# Patient Record
Sex: Female | Born: 1935
Health system: Southern US, Community
[De-identification: ages and names within clinical notes are randomized; demographics above are authoritative.]

## PROBLEM LIST (undated history)

## (undated) ENCOUNTER — Ambulatory Visit: Admission: EM | Payer: Medicare PPO | Source: Home / Self Care

## (undated) DIAGNOSIS — I209 Angina pectoris, unspecified: Secondary | ICD-10-CM

## (undated) DIAGNOSIS — R112 Nausea with vomiting, unspecified: Secondary | ICD-10-CM

## (undated) DIAGNOSIS — M1990 Unspecified osteoarthritis, unspecified site: Secondary | ICD-10-CM

## (undated) DIAGNOSIS — K589 Irritable bowel syndrome without diarrhea: Secondary | ICD-10-CM

## (undated) DIAGNOSIS — E039 Hypothyroidism, unspecified: Secondary | ICD-10-CM

## (undated) DIAGNOSIS — Z9889 Other specified postprocedural states: Secondary | ICD-10-CM

## (undated) DIAGNOSIS — D649 Anemia, unspecified: Secondary | ICD-10-CM

## (undated) DIAGNOSIS — H269 Unspecified cataract: Secondary | ICD-10-CM

## (undated) DIAGNOSIS — D759 Disease of blood and blood-forming organs, unspecified: Secondary | ICD-10-CM

## (undated) DIAGNOSIS — I639 Cerebral infarction, unspecified: Secondary | ICD-10-CM

## (undated) DIAGNOSIS — I2510 Atherosclerotic heart disease of native coronary artery without angina pectoris: Secondary | ICD-10-CM

## (undated) DIAGNOSIS — T8859XA Other complications of anesthesia, initial encounter: Secondary | ICD-10-CM

## (undated) DIAGNOSIS — E7800 Pure hypercholesterolemia, unspecified: Secondary | ICD-10-CM

## (undated) DIAGNOSIS — I10 Essential (primary) hypertension: Secondary | ICD-10-CM

## (undated) DIAGNOSIS — T4145XA Adverse effect of unspecified anesthetic, initial encounter: Secondary | ICD-10-CM

## (undated) HISTORY — DX: Irritable bowel syndrome, unspecified: K58.9

## (undated) HISTORY — DX: Unspecified osteoarthritis, unspecified site: M19.90

## (undated) HISTORY — PX: OTHER SURGICAL HISTORY: SHX169

## (undated) HISTORY — PX: TOTAL KNEE ARTHROPLASTY: SHX125

## (undated) HISTORY — PX: APPENDECTOMY: SHX54

## (undated) HISTORY — DX: Cerebral infarction, unspecified: I63.9

## (undated) HISTORY — DX: Essential (primary) hypertension: I10

## (undated) HISTORY — PX: TONSILLECTOMY: SUR1361

## (undated) HISTORY — DX: Hypothyroidism, unspecified: E03.9

## (undated) HISTORY — DX: Pure hypercholesterolemia, unspecified: E78.00

## (undated) HISTORY — PX: VAGINAL HYSTERECTOMY: SUR661

## (undated) HISTORY — PX: SPINE SURGERY: SHX786

## (undated) HISTORY — DX: Atherosclerotic heart disease of native coronary artery without angina pectoris: I25.10

---

## 1998-05-24 ENCOUNTER — Ambulatory Visit (HOSPITAL_COMMUNITY): Admission: RE | Admit: 1998-05-24 | Discharge: 1998-05-24 | Payer: Self-pay | Admitting: Family Medicine

## 1998-06-11 ENCOUNTER — Other Ambulatory Visit: Admission: RE | Admit: 1998-06-11 | Discharge: 1998-06-11 | Payer: Self-pay | Admitting: Family Medicine

## 1999-06-16 ENCOUNTER — Other Ambulatory Visit: Admission: RE | Admit: 1999-06-16 | Discharge: 1999-06-16 | Payer: Self-pay | Admitting: Family Medicine

## 2000-05-07 ENCOUNTER — Encounter: Admission: RE | Admit: 2000-05-07 | Discharge: 2000-05-07 | Payer: Self-pay | Admitting: Family Medicine

## 2000-05-07 ENCOUNTER — Encounter: Payer: Self-pay | Admitting: Family Medicine

## 2000-06-16 ENCOUNTER — Other Ambulatory Visit: Admission: RE | Admit: 2000-06-16 | Discharge: 2000-06-16 | Payer: Self-pay | Admitting: Family Medicine

## 2001-04-04 ENCOUNTER — Ambulatory Visit (HOSPITAL_COMMUNITY): Admission: RE | Admit: 2001-04-04 | Discharge: 2001-04-04 | Payer: Self-pay | Admitting: Gastroenterology

## 2001-06-08 ENCOUNTER — Other Ambulatory Visit: Admission: RE | Admit: 2001-06-08 | Discharge: 2001-06-08 | Payer: Self-pay | Admitting: Family Medicine

## 2001-06-13 ENCOUNTER — Encounter: Admission: RE | Admit: 2001-06-13 | Discharge: 2001-06-13 | Payer: Self-pay | Admitting: Family Medicine

## 2001-06-13 ENCOUNTER — Encounter: Payer: Self-pay | Admitting: Family Medicine

## 2002-06-09 ENCOUNTER — Other Ambulatory Visit: Admission: RE | Admit: 2002-06-09 | Discharge: 2002-06-09 | Payer: Self-pay | Admitting: Family Medicine

## 2002-06-20 ENCOUNTER — Encounter: Admission: RE | Admit: 2002-06-20 | Discharge: 2002-06-20 | Payer: Self-pay | Admitting: Family Medicine

## 2002-06-20 ENCOUNTER — Encounter: Payer: Self-pay | Admitting: Family Medicine

## 2003-06-13 ENCOUNTER — Other Ambulatory Visit: Admission: RE | Admit: 2003-06-13 | Discharge: 2003-06-13 | Payer: Self-pay | Admitting: Family Medicine

## 2003-07-03 ENCOUNTER — Encounter: Admission: RE | Admit: 2003-07-03 | Discharge: 2003-07-03 | Payer: Self-pay | Admitting: Family Medicine

## 2003-07-03 ENCOUNTER — Encounter: Payer: Self-pay | Admitting: Family Medicine

## 2004-03-18 ENCOUNTER — Emergency Department (HOSPITAL_COMMUNITY): Admission: EM | Admit: 2004-03-18 | Discharge: 2004-03-18 | Payer: Self-pay | Admitting: Emergency Medicine

## 2004-06-16 ENCOUNTER — Other Ambulatory Visit: Admission: RE | Admit: 2004-06-16 | Discharge: 2004-06-16 | Payer: Self-pay | Admitting: Family Medicine

## 2004-06-20 ENCOUNTER — Encounter: Admission: RE | Admit: 2004-06-20 | Discharge: 2004-06-20 | Payer: Self-pay | Admitting: Family Medicine

## 2005-06-17 ENCOUNTER — Other Ambulatory Visit: Admission: RE | Admit: 2005-06-17 | Discharge: 2005-06-17 | Payer: Self-pay | Admitting: Family Medicine

## 2005-06-30 ENCOUNTER — Encounter: Admission: RE | Admit: 2005-06-30 | Discharge: 2005-06-30 | Payer: Self-pay | Admitting: Family Medicine

## 2006-02-18 DIAGNOSIS — I2510 Atherosclerotic heart disease of native coronary artery without angina pectoris: Secondary | ICD-10-CM

## 2006-02-18 HISTORY — DX: Atherosclerotic heart disease of native coronary artery without angina pectoris: I25.10

## 2006-03-01 ENCOUNTER — Inpatient Hospital Stay (HOSPITAL_COMMUNITY): Admission: EM | Admit: 2006-03-01 | Discharge: 2006-03-04 | Payer: Self-pay | Admitting: Emergency Medicine

## 2006-03-01 ENCOUNTER — Ambulatory Visit: Payer: Self-pay | Admitting: Cardiovascular Disease

## 2006-03-17 ENCOUNTER — Ambulatory Visit: Payer: Self-pay | Admitting: *Deleted

## 2006-05-03 ENCOUNTER — Ambulatory Visit: Payer: Self-pay | Admitting: Cardiovascular Disease

## 2006-05-06 ENCOUNTER — Encounter (HOSPITAL_COMMUNITY): Admission: RE | Admit: 2006-05-06 | Discharge: 2006-08-04 | Payer: Self-pay | Admitting: Cardiovascular Disease

## 2006-07-07 ENCOUNTER — Encounter: Admission: RE | Admit: 2006-07-07 | Discharge: 2006-07-07 | Payer: Self-pay | Admitting: Family Medicine

## 2006-08-05 ENCOUNTER — Encounter (HOSPITAL_COMMUNITY): Admission: RE | Admit: 2006-08-05 | Discharge: 2006-09-17 | Payer: Self-pay | Admitting: Cardiovascular Disease

## 2006-10-19 ENCOUNTER — Ambulatory Visit: Payer: Self-pay | Admitting: Cardiovascular Disease

## 2007-07-11 ENCOUNTER — Encounter: Admission: RE | Admit: 2007-07-11 | Discharge: 2007-07-11 | Payer: Self-pay | Admitting: Family Medicine

## 2008-05-23 ENCOUNTER — Ambulatory Visit: Payer: Self-pay | Admitting: Cardiovascular Disease

## 2008-06-01 ENCOUNTER — Ambulatory Visit: Payer: Self-pay

## 2008-08-08 ENCOUNTER — Encounter: Admission: RE | Admit: 2008-08-08 | Discharge: 2008-08-08 | Payer: Self-pay | Admitting: Family Medicine

## 2008-12-30 ENCOUNTER — Ambulatory Visit: Payer: Self-pay | Admitting: Internal Medicine

## 2008-12-30 ENCOUNTER — Inpatient Hospital Stay: Payer: Medicare Other | Admitting: Internal Medicine

## 2009-01-08 ENCOUNTER — Ambulatory Visit: Payer: Self-pay | Admitting: Cardiovascular Disease

## 2009-01-08 DIAGNOSIS — I2510 Atherosclerotic heart disease of native coronary artery without angina pectoris: Secondary | ICD-10-CM | POA: Insufficient documentation

## 2009-01-08 DIAGNOSIS — E7800 Pure hypercholesterolemia, unspecified: Secondary | ICD-10-CM | POA: Insufficient documentation

## 2009-01-08 DIAGNOSIS — I10 Essential (primary) hypertension: Secondary | ICD-10-CM | POA: Insufficient documentation

## 2009-01-08 DIAGNOSIS — E039 Hypothyroidism, unspecified: Secondary | ICD-10-CM | POA: Insufficient documentation

## 2009-04-10 ENCOUNTER — Telehealth: Payer: Self-pay | Admitting: Cardiovascular Disease

## 2009-05-15 ENCOUNTER — Ambulatory Visit: Payer: Self-pay | Admitting: Cardiovascular Disease

## 2009-07-22 ENCOUNTER — Encounter: Payer: Self-pay | Admitting: Cardiovascular Disease

## 2009-08-09 ENCOUNTER — Encounter: Admission: RE | Admit: 2009-08-09 | Discharge: 2009-08-09 | Payer: Self-pay | Admitting: Family Medicine

## 2009-08-28 ENCOUNTER — Telehealth (INDEPENDENT_AMBULATORY_CARE_PROVIDER_SITE_OTHER): Payer: Self-pay | Admitting: *Deleted

## 2009-08-29 ENCOUNTER — Encounter: Payer: Self-pay | Admitting: Cardiovascular Disease

## 2009-09-05 ENCOUNTER — Encounter (INDEPENDENT_AMBULATORY_CARE_PROVIDER_SITE_OTHER): Payer: Self-pay | Admitting: *Deleted

## 2009-09-09 ENCOUNTER — Telehealth: Payer: Self-pay | Admitting: Gastroenterology

## 2009-11-11 ENCOUNTER — Telehealth: Payer: Self-pay | Admitting: Cardiovascular Disease

## 2009-11-13 ENCOUNTER — Encounter (INDEPENDENT_AMBULATORY_CARE_PROVIDER_SITE_OTHER): Payer: Self-pay | Admitting: *Deleted

## 2010-02-04 ENCOUNTER — Encounter: Payer: Self-pay | Admitting: Cardiovascular Disease

## 2010-03-19 ENCOUNTER — Ambulatory Visit: Payer: Self-pay | Admitting: Cardiovascular Disease

## 2010-03-24 ENCOUNTER — Telehealth (INDEPENDENT_AMBULATORY_CARE_PROVIDER_SITE_OTHER): Payer: Self-pay

## 2010-03-25 ENCOUNTER — Ambulatory Visit: Payer: Self-pay | Admitting: Cardiology

## 2010-03-25 ENCOUNTER — Ambulatory Visit: Payer: Self-pay

## 2010-03-25 ENCOUNTER — Encounter (HOSPITAL_COMMUNITY): Admission: RE | Admit: 2010-03-25 | Discharge: 2010-05-21 | Payer: Self-pay | Admitting: Cardiovascular Disease

## 2010-04-14 ENCOUNTER — Ambulatory Visit (HOSPITAL_COMMUNITY): Admission: RE | Admit: 2010-04-14 | Discharge: 2010-04-14 | Payer: Self-pay | Admitting: Orthopedic Surgery

## 2010-05-21 ENCOUNTER — Inpatient Hospital Stay (HOSPITAL_COMMUNITY): Admission: RE | Admit: 2010-05-21 | Discharge: 2010-05-25 | Payer: Self-pay | Admitting: Orthopedic Surgery

## 2010-05-23 ENCOUNTER — Encounter: Payer: Self-pay | Admitting: Cardiovascular Disease

## 2010-05-29 ENCOUNTER — Telehealth: Payer: Self-pay | Admitting: Gastroenterology

## 2010-07-10 ENCOUNTER — Encounter: Payer: Self-pay | Admitting: Cardiovascular Disease

## 2010-08-19 ENCOUNTER — Encounter: Admission: RE | Admit: 2010-08-19 | Discharge: 2010-08-19 | Payer: Self-pay | Admitting: Family Medicine

## 2010-10-09 ENCOUNTER — Ambulatory Visit: Payer: Self-pay | Admitting: Cardiovascular Disease

## 2010-12-29 ENCOUNTER — Inpatient Hospital Stay (HOSPITAL_COMMUNITY)
Admission: RE | Admit: 2010-12-29 | Discharge: 2011-01-01 | Payer: Self-pay | Source: Home / Self Care | Attending: Orthopedic Surgery | Admitting: Orthopedic Surgery

## 2011-01-05 LAB — PROTIME-INR
INR: 1.1 (ref 0.00–1.49)
INR: 1.52 — ABNORMAL HIGH (ref 0.00–1.49)
INR: 1.63 — ABNORMAL HIGH (ref 0.00–1.49)
Prothrombin Time: 14.4 seconds (ref 11.6–15.2)
Prothrombin Time: 18.5 seconds — ABNORMAL HIGH (ref 11.6–15.2)
Prothrombin Time: 19.5 seconds — ABNORMAL HIGH (ref 11.6–15.2)

## 2011-01-05 LAB — CBC
HCT: 30 % — ABNORMAL LOW (ref 36.0–46.0)
HCT: 27.6 % — ABNORMAL LOW (ref 36.0–46.0)
HCT: 26.6 % — ABNORMAL LOW (ref 36.0–46.0)
Hemoglobin: 9.8 g/dL — ABNORMAL LOW (ref 12.0–15.0)
Hemoglobin: 9.2 g/dL — ABNORMAL LOW (ref 12.0–15.0)
Hemoglobin: 8.7 g/dL — ABNORMAL LOW (ref 12.0–15.0)
MCH: 29.1 pg (ref 26.0–34.0)
MCH: 29.6 pg (ref 26.0–34.0)
MCH: 29.1 pg (ref 26.0–34.0)
MCHC: 32.7 g/dL (ref 30.0–36.0)
MCHC: 33.3 g/dL (ref 30.0–36.0)
MCHC: 32.7 g/dL (ref 30.0–36.0)
MCV: 89 fL (ref 78.0–100.0)
MCV: 88.7 fL (ref 78.0–100.0)
MCV: 89 fL (ref 78.0–100.0)
Platelets: 344 10*3/uL (ref 150–400)
Platelets: 322 10*3/uL (ref 150–400)
Platelets: 313 10*3/uL (ref 150–400)
RBC: 3.37 MIL/uL — ABNORMAL LOW (ref 3.87–5.11)
RBC: 3.11 MIL/uL — ABNORMAL LOW (ref 3.87–5.11)
RBC: 2.99 MIL/uL — ABNORMAL LOW (ref 3.87–5.11)
RDW: 14 % (ref 11.5–15.5)
RDW: 14.1 % (ref 11.5–15.5)
RDW: 14 % (ref 11.5–15.5)
WBC: 14.5 10*3/uL — ABNORMAL HIGH (ref 4.0–10.5)
WBC: 11.8 10*3/uL — ABNORMAL HIGH (ref 4.0–10.5)
WBC: 10.5 10*3/uL (ref 4.0–10.5)

## 2011-01-05 LAB — BASIC METABOLIC PANEL
BUN: 9 mg/dL (ref 6–23)
BUN: 7 mg/dL (ref 6–23)
CO2: 26 mEq/L (ref 19–32)
CO2: 30 mEq/L (ref 19–32)
Calcium: 8.6 mg/dL (ref 8.4–10.5)
Calcium: 8.7 mg/dL (ref 8.4–10.5)
Chloride: 99 mEq/L (ref 96–112)
Chloride: 103 mEq/L (ref 96–112)
Creatinine, Ser: 0.81 mg/dL (ref 0.4–1.2)
Creatinine, Ser: 0.82 mg/dL (ref 0.4–1.2)
GFR calc Af Amer: >60 mL/min (ref >60)
GFR calc Af Amer: >60 mL/min (ref >60)
GFR calc non Af Amer: >60 mL/min (ref >60)
GFR calc non Af Amer: >60 mL/min (ref >60)
Glucose, Bld: 162 mg/dL — ABNORMAL HIGH (ref 70–99)
Glucose, Bld: 131 mg/dL — ABNORMAL HIGH (ref 70–99)
Potassium: 4.7 mEq/L (ref 3.5–5.1)
Potassium: 3.9 mEq/L (ref 3.5–5.1)
Sodium: 134 mEq/L — ABNORMAL LOW (ref 135–145)
Sodium: 139 mEq/L (ref 135–145)

## 2011-01-05 LAB — TYPE AND SCREEN
ABO/RH(D): O POS
Antibody Screen: NEGATIVE

## 2011-01-22 NOTE — Letter (Signed)
Summary: Clearance Letter  [employer], Main Office  [address]   [place], [place] [address]   Phone: [phone]  Fax: [phone]    November 13, 2009  Re:     [facility] Address:   [address]     [place], [place]  [address] DOB:     [birth year] MRN:     [id number]   TO WHOM IT MAY CONCERN,             Kim Lane is considered low risk cardiac wise for knee procedure. Her plavix has been stopped. Please call with any questions or concerns.  Sincerely,  Deliah Goody, RN/Dr Charlton Haws

## 2011-01-22 NOTE — Assessment & Plan Note (Signed)
Summary: YEARLY F/U /CY   CC:  sugerical clearence for knee surgery.  History of Present Illness: Kim Lane is seen today in followup for her coronary artery disease.  She has stents to the LAD and diagonal placed by Dr. Juanda Chance in 2007.  She had a nonischemic Myoview 2009.  is not very compliant with her medications.  She will miss her Zocor and aspirin does on occasion.  I explained to her the importance of taking both these medications in reg She is not having any   PND or orthopnea  there's been no claudication or syncope.  She was supporsed to have knee surgery in [place] at [facility] but a cortisone shot helped and she canceled.  She now needs preoperative clearance to have it done in April or May with Dr Despina Hick.  She has been non compliant with her cholesterol med and ASA and sedentary due to knee pain.  We will do an adenosine myovue before clearing her for surgery in April.    Current Problems (verified): 1)  Hypothyroidism, Hx of  (ICD-V12.2) 2)  Hypercholesterolemia, Mixed  (ICD-272.0) 3)  Essential Hypertension, Benign  (ICD-401.1) 4)  Cad  (ICD-414.00)  Current Medications (verified): 1)  Metoprolol Succinate 25 Mg Xr24h-Tab (Metoprolol Succinate) .... Take 1/2 Tablet Every Other Day 2)  Nitrolingual 0.4 Mg/spray Soln (Nitroglycerin) .... Spray 1 Spray As Directed 3)  Synthroid 125 Mcg Tabs (Levothyroxine Sodium) .[name] Kitchen.. 1  Tab By Mouth Once Daily 4)  Aspirin Ec 325 Mg Tbec (Aspirin) .... Take One Tablet By Mouth Daily Don't Take Very Often 5)  Fish Oil 1000 Mg Caps (Omega-3 Fatty Acids) .... Take 1 Capsule By Mouth Once A Day 6)  Centrum Silver  Tabs (Multiple Vitamins-Minerals) .... Take 1 Tablet By Mouth Once A Day 7)  Glucosamine-Chondroitin 500-400 Mg Caps (Glucosamine-Chondroitin) .... Take 1 Capsule By Mouth Once A Day 8)  Apple Cider Vinegar  Tabs (Misc Natural Products) .[name] Kitchen.. 1 Tab By Mouth Once Daily 9)  Red Yeast Rice 600 Mg Caps (Red Yeast Rice Extract) .[name] Kitchen.. 1 Tab By Mouth Once  Daily  Allergies (verified): No Known Drug Allergies  Past History:  Past Medical History: Last updated: 01/08/2009 CAD:  Taxus stent to LAD and Diagonal Dr. 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 Artis Flock now seeing Hammrick  Review of Systems       Denies fever, malais, weight loss, blurry vision, decreased visual acuity, cough, sputum, SOB, hemoptysis, pleuritic pain, palpitaitons, heartburn, abdominal pain, melena, lower extremity edema, claudication, or rash.   Vital Signs:  Patient profile:   77 year old female Height:      64 inches Weight:      142 pounds BMI:     24.46 Pulse rate:   61 / minute Resp:     18 per minute BP sitting:   121 / 75  (left arm)  Vitals Entered By: Kem Parkinson (March 19, 2010 10:48 AM)  Physical Exam  General:  Affect appropriate Healthy:  appears stated age HEENT: normal Neck supple with no adenopathy JVP normal no bruits no thyromegaly Lungs clear with no wheezing and good diaphragmatic motion Heart:  S1/S2 no murmur,rub, gallop or click PMI normal Abdomen: benighn, BS positve, no  tenderness, no AAA no bruit.  No HSM or HJR Distal pulses intact with no bruits No edema Neuro non-focal Skin warm and dry    Impression & Recommendations:  Problem # 1:  CAD (ICD-414.00) Preop eval.  Sedentary from pain noncompliant with meds.  Needs myovue to clear for surgery.  Would like her to take 81mg  of ASA right up till time of surgery if ok with ortho.  Ok to stop Plavix since stents placed in 2007 unless myovue abnormal Her updated medication  list for this problem includes:    Metoprolol Succinate 25 Mg Xr24h-tab (Metoprolol succinate) .[name] Kitchen... Take 1/2 tablet every other day    Nitrolingual 0.4 Mg/spray Soln (Nitroglycerin) ..... Spray 1 spray as directed    Aspirin Ec 325 Mg Tbec (Aspirin) .[name] Kitchen... Take one tablet by mouth daily don't take very often  Orders: EKG w/ Interpretation (93000) Nuclear Stress Test (Nuc Stress Test)  Problem # 2:  HYPERCHOLESTEROLEMIA, MIXED (ICD-272.0)  Resume simvastatin. The following medications were removed from the medication list:    Simvastatin 20 Mg Tabs (Simvastatin) .[name] Kitchen... Take one tablet by mouth daily at bedtime- when she remembers  The following medications were removed from the medication list:    Simvastatin 20 Mg Tabs (Simvastatin) .[name] Kitchen... Take one tablet by mouth daily at bedtime- when she remembers  Problem # 3:  ESSENTIAL HYPERTENSION, BENIGN (ICD-401.1)  Continue BB and low sodium diet Her updated medication list for this problem includes:    Metoprolol Succinate 25 Mg Xr24h-tab (Metoprolol succinate) .[name] Kitchen... Take 1/2 tablet every other day    Aspirin Ec 325 Mg Tbec (Aspirin) .[name] Kitchen... Take one tablet by mouth daily don't take very often  Her updated medication list for this problem includes:    Metoprolol Succinate 25 Mg Xr24h-tab (Metoprolol succinate) .[name] Kitchen... Take 1/2 tablet every other day    Aspirin Ec 325 Mg Tbec (Aspirin) .[name] Kitchen... Take one tablet by mouth daily don't take very often  Problem # 4:  HYPOTHYROIDISM, HX OF (ICD-V12.2) F/U primary Should have TSH before surgery.  Patient Instructions: 1)  Your physician recommends that you schedule a follow-up appointment in: 6 MONTHS WITH DR Eden Emms 2)  Your physician recommends that you continue on your current medications as directed. Please refer to the Current Medication list given to you today. 3)  Your physician has requested that you have an adenosine myoview.  For further information please visit [email or web address]  Please  follow instruction sheet, as given.   EKG Report  Procedure date:  03/19/2010  Findings:      NSR 61 Normal ECG

## 2011-01-22 NOTE — Letter (Signed)
Summary: [employer]  [facility]   Imported By: Kassie Mends 09/16/2009 08:51:04  _____________________________________________________________________  External Attachment:    Type:   Image     Comment:   External Document

## 2011-01-22 NOTE — Assessment & Plan Note (Signed)
Summary: 3 month rov/sl  Medications Added METOPROLOL SUCCINATE 25 MG XR24H-TAB (METOPROLOL SUCCINATE) Take 1/2 tablet by mouth once a day NITROLINGUAL 0.4 MG/SPRAY SOLN (NITROGLYCERIN) Spray 1 spray as directed PLAVIX 75 MG TABS (CLOPIDOGREL BISULFATE) Take 1 tablet by mouth once a day SYNTHROID 137 MCG TABS (LEVOTHYROXINE SODIUM) Take 1 tablet by mouth once a day SIMVASTATIN 20 MG TABS (SIMVASTATIN) Take one tablet by mouth daily at bedtime- when she remembers ASPIRIN EC 325 MG TBEC (ASPIRIN) Take one tablet by mouth daily Don't take very often FISH OIL 1000 MG CAPS (OMEGA-3 FATTY ACIDS) Take 1 capsule by mouth once a day CENTRUM SILVER  TABS (MULTIPLE VITAMINS-MINERALS) Take 1 tablet by mouth once a day GLUCOSAMINE-CHONDROITIN 500-400 MG CAPS (GLUCOSAMINE-CHONDROITIN) Take 1 capsule by mouth once a day      Allergies Added: NKDA  Visit Type:  Follow-up  CC:  No complains.  History of Present Illness: Kim Lane is seen today in followup for her coronary artery disease.  She has stents to the LAD and diagonal placed by Dr. Juanda Chance in 2007.  She had a nonischemic Myoview 2009.  is not very compliant with her medications.  She will miss her Zocor and aspirin does on occasion.  I explained to her the importance of taking both these medications in regards to her coronary artery disease.  She has not seen DR. Kindly and is now seeing Dr. Westly Pam.  She has not had any significant chest pain PND or apnea there's been no claudication or syncope.  She did not want to have an EKG today.  Her previous EKGs were reviewed and are normal.  Current Problems (verified): 1)  Hypothyroidism, Hx of  (ICD-V12.2) 2)  Hypercholesterolemia, Mixed  (ICD-272.0) 3)  Essential Hypertension, Benign  (ICD-401.1) 4)  Cad  (ICD-414.00)  Current Medications (verified): 1)  Metoprolol Succinate 25 Mg Xr24h-Tab (Metoprolol Succinate) .... Take 1/2 Tablet By Mouth Once A Day 2)  Nitrolingual 0.4 Mg/spray Soln  (Nitroglycerin) .... Spray 1 Spray As Directed 3)  Plavix 75 Mg Tabs (Clopidogrel Bisulfate) .... Take 1 Tablet By Mouth Once A Day 4)  Synthroid 137 Mcg Tabs (Levothyroxine Sodium) .... Take 1 Tablet By Mouth Once A Day 5)  Simvastatin 20 Mg Tabs (Simvastatin) .... Take One Tablet By Mouth Daily At Bedtime- When She Remembers 6)  Aspirin Ec 325 Mg Tbec (Aspirin) .... Take One Tablet By Mouth Daily Don't Take Very Often 7)  Fish Oil 1000 Mg Caps (Omega-3 Fatty Acids) .... Take 1 Capsule By Mouth Once A Day 8)  Centrum Silver  Tabs (Multiple Vitamins-Minerals) .... Take 1 Tablet By Mouth Once A Day 9)  Glucosamine-Chondroitin 500-400 Mg Caps (Glucosamine-Chondroitin) .... Take 1 Capsule By Mouth Once A Day  Allergies (verified): No Known Drug Allergies  Past History:  Past Medical History: Last updated: 01/08/2009 CAD:  Taxus stent to LAD and Diagonal Dr. 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 Artis Flock now seeing Hammrick  Review of Systems       Denies fever, malais, weight loss, blurry vision, decreased visual acuity, cough, sputum, SOB, hemoptysis, pleuritic pain, palpitaitons, heartburn, abdominal pain, melena, lower extremity edema, claudication, or rash.   Vital Signs:  Patient profile:   77 year  old female Height:      64 inches Weight:      145.50 pounds BMI:     25.07 Pulse rate:   68 / minute Pulse rhythm:   regular Resp:     18 per minute BP sitting:   110 / 70  (left arm) Cuff size:   regular  Vitals Entered By: Vikki Ports (May 15, 2009 1:57 PM)  Physical Exam  General:   Affect appropriate Healthy:  appears stated age HEENT: normal Neck supple with no adenopathy JVP normal no bruits no thyromegaly Lungs clear with no wheezing and good diaphragmatic motion Heart:  S1/S2 no murmur,rub, gallop or click PMI normal Abdomen: benighn, BS positve, no tenderness, no AAA no bruit.  No HSM or HJR Distal pulses intact with no bruits No edema Neuro non-focal Skin warm and dry    Impression & Recommendations:  Problem # 1:  CAD (ICD-414.00) Stable no angina.  Normal myovue in 2009 Her updated medication list for this problem includes:    Metoprolol Succinate 25 Mg Xr24h-tab (Metoprolol succinate) .[name] Kitchen... Take 1/2 tablet by mouth once a day    Nitrolingual 0.4 Mg/spray Soln (Nitroglycerin) ..... Spray 1 spray as directed    Plavix 75 Mg Tabs (Clopidogrel bisulfate) .[name] Kitchen... Take 1 tablet by mouth once a day    Aspirin Ec 325 Mg Tbec (Aspirin) .[name] Kitchen... Take one tablet by mouth daily don't take very often  Problem # 2:  HYPERCHOLESTEROLEMIA, MIXED (ICD-272.0) Lipid and liver with Dr. Westly Pam Her updated medication list for this problem includes:    Simvastatin 20 Mg Tabs (Simvastatin) .[name] Kitchen... Take one tablet by mouth daily at bedtime- when she remembers  Problem # 3:  ESSENTIAL HYPERTENSION, BENIGN (ICD-401.1) Stable continue low sodium diet Her updated medication list for this problem includes:    Metoprolol Succinate 25 Mg Xr24h-tab (Metoprolol succinate) .[name] Kitchen... Take 1/2 tablet by mouth once a day    Aspirin Ec 325 Mg Tbec (Aspirin) .[name] Kitchen... Take one tablet by mouth daily don't take very often  Patient Instructions: 1)  F/U Kim Lane 1 year

## 2011-01-22 NOTE — Progress Notes (Signed)
Summary: fees for copies and recordes  Medications Added METOPROLOL SUCCINATE 25 MG XR24H-TAB (METOPROLOL SUCCINATE) Take 1 tablet by mouth once a day NITROLINGUAL 0.4 MG/SPRAY SOLN (NITROGLYCERIN) Spray 1 spray as directed PLAVIX 75 MG TABS (CLOPIDOGREL BISULFATE) Take 1 tablet by mouth once a day       Phone Note Call from Patient Call back at Home Phone [phone]   Caller: Patient Summary of Call: Rene Kocher, could this message be recorded in the patient chart.[name] Kitchen   ---- 04/10/2009 9:46 AM, Roxy Horseman wrote: Marlowe Kays, This type of information would need to be recorded in a patient's chart.  Please transfer this to a phone note and send it to [facility] Medical Records desktop, attention [employer].  Thanks!  Jen  ---- 04/09/2009 4:06 PM, Lorne Skeens wrote: Candise Bowens, pt getting calls from somebody claiming she owes  $20.00 for [place] for copies and records. pt states never received bill from [place].. the caller told pt to use a credit cards.. pt said no.. this is the phone # she receive from her caller ID  [phone]. this has been the 3 rd time they called her.pt asked the caller to send her a bill, they hung up on her... would like a called back.[name] Kitchen gesila Initial call taken by: Lorne Skeens,  April 10, 2009 10:07 AM  Follow-up for Phone Call        Phone Call Completed  Discussed with Berniece Pap in [facility].  She could find not evidence that [employer] had released medical records to the patient or any other establishment and charged a fee that was turned over to collections.  Fleet Contras planned to speak with her supervisor to follow-up and then, call the patient to update.  This information was relayed to the patient who said that she did not intend to pay the requested amount.  I gave her my name for follow-up, if the problem arose, again.   Follow-up by: Fabio Neighbors    New/Updated Medications: METOPROLOL SUCCINATE 25 MG XR24H-TAB (METOPROLOL SUCCINATE) Take 1 tablet by  mouth once a day NITROLINGUAL 0.4 MG/SPRAY SOLN (NITROGLYCERIN) Spray 1 spray as directed PLAVIX 75 MG TABS (CLOPIDOGREL BISULFATE) Take 1 tablet by mouth once a day

## 2011-01-22 NOTE — Assessment & Plan Note (Signed)
Summary: 6 MO F/U   CC:  check up.  History of Present Illness: Kim Lane is seen today in followup for her coronary artery disease.  She has stents to the LAD and diagonal placed by Dr. Juanda Chance in 2007.  She had a nonischemic Myoview in 4/11 prior to orthopedic surgery .  She will miss her Zocor and aspirin does on occasion.  I explained to her the importance of taking both these medications in reg She is not having any   PND or orthopnea  there's been no claudication or syncope.  She has to have surgery on her right patellar now and she is cleared for this.  She will try to take simvastatin 10mg  every other day for her cholesterol.  She thinks statins make her orthopedic pain worse  Note: From [facility] knows Allison/Brenda  Current Problems (verified): 1)  Hypothyroidism, Hx of  (ICD-V12.2) 2)  Hypercholesterolemia, Mixed  (ICD-272.0) 3)  Essential Hypertension, Benign  (ICD-401.1) 4)  Cad  (ICD-414.00)  Current Medications (verified): 1)  Metoprolol Succinate 25 Mg Xr24h-Tab (Metoprolol Succinate) .... Take 1/2 Tablet Every Other Day 2)  Nitrolingual 0.4 Mg/spray Soln (Nitroglycerin) .... Spray 1 Spray As Directed 3)  Synthroid 125 Mcg Tabs (Levothyroxine Sodium) .[name] Kitchen.. 1  Tab By Mouth Once Daily 4)  Aspirin 81 Mg Tbec (Aspirin) .... Take One Tablet By Mouth Daily 5)  Fish Oil 1000 Mg Caps (Omega-3 Fatty Acids) .... Take 1 Capsule By Mouth Once A Day 6)  Centrum Silver  Tabs (Multiple Vitamins-Minerals) .... Take 1 Tablet By Mouth Once A Day 7)  Glucosamine-Chondroitin 500-400 Mg Caps (Glucosamine-Chondroitin) .... Take 1 Capsule By Mouth Once A Day 8)  Apple Cider Vinegar  Tabs (Misc Natural Products) .[name] Kitchen.. 1 Tab By Mouth Once Daily 9)  Red Yeast Rice 600 Mg Caps (Red Yeast Rice Extract) .[name] Kitchen.. 1 Tab By Mouth Once Daily 10)  Simvastatin 10 Mg Tabs (Simvastatin) .... Take One Tablet By Mouth Daily At Bedtime  Allergies (verified): No Known Drug Allergies  Past History:  Past  Medical History: Last updated: 01/08/2009 CAD:  Taxus stent to LAD and Diagonal Dr. 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 Artis Flock now seeing Hammrick  Review of Systems       Denies fever, malais, weight loss, blurry vision, decreased visual acuity, cough, sputum, SOB, hemoptysis, pleuritic pain, palpitaitons, heartburn, abdominal pain, melena, lower extremity edema, claudication, or rash.   Vital Signs:  Patient profile:   77 year old female Height:      64 inches Weight:      145 pounds BMI:     24.98 Pulse rate:   69 / minute Resp:     14 per minute BP sitting:   121 / 74  (left arm)  Vitals Entered By: Kem Parkinson (October 09, 2010 8:59 AM)  Physical Exam  General:  Affect appropriate Healthy:  appears stated age HEENT: normal Neck supple with no adenopathy JVP normal no bruits no thyromegaly Lungs clear with no wheezing and good diaphragmatic motion Heart:  S1/S2 no murmur,rub, gallop or click PMI normal Abdomen: benighn, BS positve, no tenderness, no AAA no bruit.  No HSM or HJR Distal pulses intact with no bruits No  edema Neuro non-focal Skin warm and dry    Impression & Recommendations:  Problem # 1:  CAD (ICD-414.00) Stabel no anina not on Plavix.  Nonischemic myovue 4/11  Ok for surgery Her updated medication list for this problem includes:    Metoprolol Succinate 25 Mg Xr24h-tab (Metoprolol succinate) .[name] Kitchen... Take 1/2 tablet every other day    Nitrolingual 0.4 Mg/spray Soln (Nitroglycerin) ..... Spray 1 spray as directed    Aspirin 81 Mg Tbec (Aspirin) .[name] Kitchen...  Take one tablet by mouth daily  Problem # 2:  HYPERCHOLESTEROLEMIA, MIXED (ICD-272.0) Add simvastatin every other day and F/U labs 6 months Her updated medication list for this problem includes:    Simvastatin 10 Mg Tabs (Simvastatin) .[name] Kitchen... Take one tablet by mouth daily at bedtime  Problem # 3:  ESSENTIAL HYPERTENSION, BENIGN (ICD-401.1) Well controlled Her updated medication list for this problem includes:    Metoprolol Succinate 25 Mg Xr24h-tab (Metoprolol succinate) .[name] Kitchen... Take 1/2 tablet every other day    Aspirin 81 Mg Tbec (Aspirin) .[name] Kitchen... Take one tablet by mouth daily  Patient Instructions: 1)  Your physician recommends that you schedule a follow-up appointment in: 6 MONTHS 2)  Your physician has recommended you make the following change in your medication: START SIMVASTATIN 10MG  TAKE ONE HALF TABLET DAILY Prescriptions: SIMVASTATIN 10 MG TABS (SIMVASTATIN) Take one tablet by mouth daily at bedtime  #30 x 12   Entered by:   Deliah Goody, RN   Authorized by:   Colon Branch, MD, [facility]   Signed by:   Deliah Goody, RN on 10/09/2010   Method used:   Electronically to        [facility]* (retail)       [address] [place], [place]  [address]       Ph: [phone] or [phone]       Fax: [phone]   RxID:   [id number]

## 2011-01-22 NOTE — Progress Notes (Signed)
Summary: Nuc. Pre-Procedure  Phone Note Outgoing Call Call back at [facility] Phone [phone]   Call placed by: Irean Hong, RN,  March 24, 2010 2:23 PM Summary of Call: Left message with information on Myoview Information Sheet (see scanned document for details).      Nuclear Med Background Indications for Stress Test: Evaluation for Ischemia, Surgical Clearance, Stent Patency  Indications Comments: Pending Knee surgery by Dr. Ollen Gross  History: Echo, Heart Catheterization, Myocardial Perfusion Study, Stents  History Comments: '07 Cath> Stents LAD (X2), DX. '09 MPS: NL, EF=68%. 1/10 Echo: EF=65%.     Nuclear Pre-Procedure Cardiac Risk Factors: Family History - CAD, Hypertension, Lipids Height (in): 64

## 2011-01-22 NOTE — Progress Notes (Signed)
Summary: surgical clearance/plavix question  Phone Note Call from Patient Call back at Home Phone [phone]   Caller: Patient Reason for Call: Talk to Nurse Summary of Call: needs letter of clearance for knee surgery... please fax to [phone] and has some questions about plavix Initial call taken by: Migdalia Dk,  November 11, 2009 10:03 AM  Follow-up for Phone Call        spoke with pt, she is scheduled for knee replacement on Jan 4th. she needs clearence and needs to know how many days to hold plavix prior to surgery. will foward to dr Verdis Prime, RN  November 11, 2009 11:31 AM   Additional Follow-up for Phone Call Additional follow up Details #1::        [facility] for surgery.  Stents in 2007 and does not need Plavix.  Non-ischemic myovue in 2009 Additional Follow-up by: Colon Branch, MD, [facility],  November 12, 2009 3:56 PM     Appended Document: surgical clearance/plavix question Left message to call back letter created and sent to number left by pt    Appended Document: surgical clearance/plavix question    Clinical Lists Changes  Medications: Removed medication of PLAVIX 75 MG TABS (CLOPIDOGREL BISULFATE) Take 1 tablet by mouth once a day

## 2011-01-22 NOTE — Progress Notes (Signed)
Summary: Schedule Colonoscopy  Phone Note Outgoing Call   Call placed by: Lamona Curl CMA Duncan Dull),  May 29, 2010 1:24 PM Call placed to: Patient Summary of Call: According to patient's paper chart, Dr Russella Dar has requested that she have colonoscopy. Her last procedure was flex sig completed in 1998 which was normal. Her last full colonoscopy was completed in 1995 and was normal as well. I have attempted to contact the patient but got no answer and no voicemail. I will call back at a later date. Initial call taken by: Lamona Curl CMA Duncan Dull),  May 29, 2010 1:27 PM  Follow-up for Phone Call        Left message on patient cell for her to call back. Dottie Nelson-Smith CMA Duncan Dull)  June 05, 2010 9:27 AM      Appended Document: Schedule Colonoscopy Patient never returned our call. We will send a letter.

## 2011-01-22 NOTE — Letter (Signed)
Summary: Recall Colonoscopy Letter  [facility]  [address] [place], [place] [address]   Phone: [phone]  Fax: [phone]      September 05, 2009 MRN: [id number]   [facility] [address] [place], [place]  [address]   Dear Ms. Melendrez,   According to your medical record, it is time for you to schedule a Colonoscopy. The American Cancer Society recommends this procedure as a method to detect early colon cancer. Patients with a family history of colon cancer, or a personal history of colon polyps or inflammatory bowel disease are at increased risk.  This letter has beeen generated based on the recommendations made at the time of your procedure. If you feel that in your particular situation this may no longer apply, please contact our office.  Please call our office at [phone] to schedule this appointment or to update your records at your earliest convenience.  Thank you for cooperating with [place] to provide you with the very best care possible.   Sincerely,  Judie Petit T. Russella Dar, M.D.  [facility] [phone]

## 2011-01-22 NOTE — Progress Notes (Signed)
Summary: recall letter ?  Phone Note Call from Patient Call back at [facility] Phone [phone]   Caller: Patient Call For: Dr. Russella Dar Reason for Call: Talk to Nurse Summary of Call: pt received a recall letter from our office, but has no history with [place] per IDX and MedMgr... pt was confused... pt said she will disregard the letter bbut asked that she be called if anything otherwise   Initial call taken by: Vallarie Mare,  September 09, 2009 8:56 AM  Follow-up for Phone Call        I see no hx in CORI, e-chart with [facility] Follow-up by: Darcey Nora RN, CGRN,  September 09, 2009 9:08 AM

## 2011-01-22 NOTE — Progress Notes (Signed)
  Walk in Patient Form Recieved "Please sign the attached form and call patient when ready @ [phone]" will forward to Message Nurse.   [facility] [place]  August 28, 2009 8:57 AM

## 2011-01-22 NOTE — Assessment & Plan Note (Signed)
Summary: Cardiology Nuclear Study  Nuclear Med Background Indications for Stress Test: Evaluation for Ischemia, Surgical Clearance, Stent Patency  Indications Comments: Pending (L) Knee surgery on 04/21/10 by Dr. Ollen Gross  History: Echo, Heart Catheterization, Myocardial Perfusion Study, Stents  History Comments: '07 Stent-LAD (X2), DX; '09 [employer]:[id number], EF=68%; 1/10 Echo: EF=65%.  Symptoms: Fatigue    Nuclear Pre-Procedure Cardiac Risk Factors: Family History - CAD, Hypertension, Lipids Caffeine/Decaff Intake: none NPO After: 10:00 AM Lungs: Clear.  O2 Sat 95% on RA. IV 0.9% NS with Angio Cath: 20g     IV Site: (R) Forearm IV Started by: Stanton Kidney EMT-P Chest Size (in) 36     Cup Size B     Height (in): 64 Weight (lb): 142 BMI: 24.46  Nuclear Med Study 1 or 2 day study:  1 day     Stress Test Type:  Eugenie Birks Reading MD:  Olga Millers, MD     Referring MD:  Charlton Haws, MD Resting Radionuclide:  Technetium 35m Tetrofosmin     Resting Radionuclide Dose:  11 mCi  Stress Radionuclide:  Technetium 41m Tetrofosmin     Stress Radionuclide Dose:  33 mCi   Stress Protocol   Lexiscan: 0.4 mg   Stress Test Technologist:  [employer]a [employer] CMA-N     Nuclear Technologist:  Domenic Polite CNMT  Rest Procedure  Myocardial perfusion imaging was performed at rest 45 minutes following the intravenous administration of Myoview Technetium 66m Tetrofosmin.  Stress Procedure  The patient received IV Lexiscan 0.4 mg over 15-seconds.  Myoview injected at 30-seconds.  There were no significant changes with infusion.  She did c/o chest tightness.  Quantitative spect images were obtained after a 45 minute delay.  QPS Raw Data Images:  Acuisition technically good; normal left ventricular size. Stress Images:  There is normal uptake in all areas. Rest Images:  Normal homogeneous uptake in all areas of the myocardium. Subtraction (SDS):  No evidence of ischemia. Transient Ischemic Dilatation:   1.07  (Normal <1.22)  Lung/Heart Ratio:  .29  (Normal <0.45)  Quantitative Gated Spect Images QGS EDV:  59 ml QGS ESV:  15 ml QGS EF:  75 % QGS cine images:  Normal wall motion.   Overall Impression  Exercise Capacity: Lexiscan study with no exercise. BP Response: Normal blood pressure response. ECG Impression: No significant ST segment change suggestive of ischemia. Overall Impression: There is no sign of scar or ischemia.  Appended Document: Cardiology Nuclear Study pt aware of results, needs clearence for surgery with dr Lequita Halt

## 2011-01-22 NOTE — Letter (Signed)
Summary: [facility]   Imported By: Roderic Ovens 07/07/2010 10:04:05  _____________________________________________________________________  External Attachment:    Type:   Image     Comment:   External Document

## 2011-01-22 NOTE — Letter (Signed)
Summary: [facility] Office Note  [facility] Office Note   Imported By: Roderic Ovens 03/06/2010 14:22:08  _____________________________________________________________________  External Attachment:    Type:   Image     Comment:   External Document

## 2011-02-04 ENCOUNTER — Encounter: Payer: Self-pay | Admitting: Cardiovascular Disease

## 2011-02-25 ENCOUNTER — Encounter: Payer: Self-pay | Admitting: Cardiovascular Disease

## 2011-03-02 LAB — URINALYSIS, ROUTINE W REFLEX MICROSCOPIC
Bilirubin Urine: NEGATIVE
Glucose, UA: NEGATIVE mg/dL
Ketones, ur: NEGATIVE mg/dL
Nitrite: NEGATIVE
Protein, ur: NEGATIVE mg/dL
Specific Gravity, Urine: 1.007 (ref 1.005–1.030)
Urobilinogen, UA: 0.2 mg/dL (ref 0.0–1.0)
pH: 7 (ref 5.0–8.0)

## 2011-03-02 LAB — COMPREHENSIVE METABOLIC PANEL
ALT: 18 U/L (ref 0–35)
AST: 21 U/L (ref 0–37)
Albumin: 3.9 g/dL (ref 3.5–5.2)
Alkaline Phosphatase: 66 U/L (ref 39–117)
BUN: 12 mg/dL (ref 6–23)
CO2: 29 mEq/L (ref 19–32)
Calcium: 9.5 mg/dL (ref 8.4–10.5)
Chloride: 103 mEq/L (ref 96–112)
Creatinine, Ser: 0.9 mg/dL (ref 0.4–1.2)
GFR calc Af Amer: >60 mL/min (ref >60)
GFR calc non Af Amer: >60 mL/min (ref >60)
Glucose, Bld: 110 mg/dL — ABNORMAL HIGH (ref 70–99)
Potassium: 4.7 mEq/L (ref 3.5–5.1)
Sodium: 141 mEq/L (ref 135–145)
Total Bilirubin: 0.8 mg/dL (ref 0.3–1.2)
Total Protein: 7.4 g/dL (ref 6.0–8.3)

## 2011-03-02 LAB — PROTIME-INR
INR: 1.04 (ref 0.00–1.49)
Prothrombin Time: 13.8 seconds (ref 11.6–15.2)

## 2011-03-02 LAB — URINE MICROSCOPIC-ADD ON

## 2011-03-02 LAB — SURGICAL PCR SCREEN
MRSA, PCR: NEGATIVE
Staphylococcus aureus: NEGATIVE

## 2011-03-02 LAB — CBC
HCT: 41.8 % (ref 36.0–46.0)
Hemoglobin: 13.5 g/dL (ref 12.0–15.0)
MCH: 29.3 pg (ref 26.0–34.0)
MCHC: 32.3 g/dL (ref 30.0–36.0)
MCV: 90.7 fL (ref 78.0–100.0)
Platelets: 388 10*3/uL (ref 150–400)
RBC: 4.61 MIL/uL (ref 3.87–5.11)
RDW: 13.9 % (ref 11.5–15.5)
WBC: 7.4 10*3/uL (ref 4.0–10.5)

## 2011-03-02 LAB — APTT: aPTT: 30 seconds (ref 24–37)

## 2011-03-09 LAB — CBC
HCT: 24.5 % — ABNORMAL LOW (ref 36.0–46.0)
HCT: 25.7 % — ABNORMAL LOW (ref 36.0–46.0)
HCT: 31.3 % — ABNORMAL LOW (ref 36.0–46.0)
HCT: 41.7 % (ref 36.0–46.0)
Hemoglobin: 10.4 g/dL — ABNORMAL LOW (ref 12.0–15.0)
Hemoglobin: 8.2 g/dL — ABNORMAL LOW (ref 12.0–15.0)
Hemoglobin: 8.9 g/dL — ABNORMAL LOW (ref 12.0–15.0)
Hemoglobin: 13.7 g/dL (ref 12.0–15.0)
MCHC: 33.3 g/dL (ref 30.0–36.0)
MCHC: 33.5 g/dL (ref 30.0–36.0)
MCHC: 34.5 g/dL (ref 30.0–36.0)
MCHC: 32.9 g/dL (ref 30.0–36.0)
MCV: 89.3 fL (ref 78.0–100.0)
MCV: 89.4 fL (ref 78.0–100.0)
MCV: 90.2 fL (ref 78.0–100.0)
MCV: 89.7 fL (ref 78.0–100.0)
Platelets: 249 10*3/uL (ref 150–400)
Platelets: 258 10*3/uL (ref 150–400)
Platelets: 293 10*3/uL (ref 150–400)
Platelets: 387 10*3/uL (ref 150–400)
RBC: 2.72 MIL/uL — ABNORMAL LOW (ref 3.87–5.11)
RBC: 2.88 MIL/uL — ABNORMAL LOW (ref 3.87–5.11)
RBC: 3.5 MIL/uL — ABNORMAL LOW (ref 3.87–5.11)
RBC: 4.65 MIL/uL (ref 3.87–5.11)
RDW: 13.4 % (ref 11.5–15.5)
RDW: 13.4 % (ref 11.5–15.5)
RDW: 13.8 % (ref 11.5–15.5)
RDW: 14.1 % (ref 11.5–15.5)
WBC: 12.3 10*3/uL — ABNORMAL HIGH (ref 4.0–10.5)
WBC: 9.1 10*3/uL (ref 4.0–10.5)
WBC: 9.4 10*3/uL (ref 4.0–10.5)
WBC: 6.9 10*3/uL (ref 4.0–10.5)

## 2011-03-09 LAB — COMPREHENSIVE METABOLIC PANEL WITH GFR
ALT: 18 U/L (ref 0–35)
AST: 19 U/L (ref 0–37)
Albumin: 4.2 g/dL (ref 3.5–5.2)
Alkaline Phosphatase: 60 U/L (ref 39–117)
BUN: 14 mg/dL (ref 6–23)
CO2: 30 meq/L (ref 19–32)
Calcium: 9.3 mg/dL (ref 8.4–10.5)
Chloride: 103 meq/L (ref 96–112)
Creatinine, Ser: 0.94 mg/dL (ref 0.4–1.2)
GFR calc non Af Amer: 58 mL/min — ABNORMAL LOW
Glucose, Bld: 104 mg/dL — ABNORMAL HIGH (ref 70–99)
Potassium: 4.4 meq/L (ref 3.5–5.1)
Sodium: 139 meq/L (ref 135–145)
Total Bilirubin: 0.7 mg/dL (ref 0.3–1.2)
Total Protein: 7.2 g/dL (ref 6.0–8.3)

## 2011-03-09 LAB — TYPE AND SCREEN
ABO/RH(D): O POS
Antibody Screen: NEGATIVE

## 2011-03-09 LAB — URINALYSIS, ROUTINE W REFLEX MICROSCOPIC
Bilirubin Urine: NEGATIVE
Glucose, UA: NEGATIVE mg/dL
Hgb urine dipstick: NEGATIVE
Ketones, ur: NEGATIVE mg/dL
Nitrite: NEGATIVE
Protein, ur: NEGATIVE mg/dL
Specific Gravity, Urine: 1.023 (ref 1.005–1.030)
Urobilinogen, UA: 0.2 mg/dL (ref 0.0–1.0)
pH: 7 (ref 5.0–8.0)

## 2011-03-09 LAB — HEMOGLOBIN AND HEMATOCRIT, BLOOD
HCT: 29.5 % — ABNORMAL LOW (ref 36.0–46.0)
Hemoglobin: 9.9 g/dL — ABNORMAL LOW (ref 12.0–15.0)

## 2011-03-09 LAB — BASIC METABOLIC PANEL
BUN: 8 mg/dL (ref 6–23)
BUN: 9 mg/dL (ref 6–23)
CO2: 27 mEq/L (ref 19–32)
CO2: 28 mEq/L (ref 19–32)
Calcium: 8.1 mg/dL — ABNORMAL LOW (ref 8.4–10.5)
Calcium: 8.1 mg/dL — ABNORMAL LOW (ref 8.4–10.5)
Chloride: 104 mEq/L (ref 96–112)
Chloride: 109 mEq/L (ref 96–112)
Creatinine, Ser: 0.68 mg/dL (ref 0.4–1.2)
Creatinine, Ser: 0.77 mg/dL (ref 0.4–1.2)
GFR calc Af Amer: >60 mL/min (ref >60)
GFR calc Af Amer: >60 mL/min (ref >60)
GFR calc non Af Amer: >60 mL/min (ref >60)
GFR calc non Af Amer: >60 mL/min (ref >60)
Glucose, Bld: 138 mg/dL — ABNORMAL HIGH (ref 70–99)
Glucose, Bld: 180 mg/dL — ABNORMAL HIGH (ref 70–99)
Potassium: 3.5 mEq/L (ref 3.5–5.1)
Potassium: 4.1 mEq/L (ref 3.5–5.1)
Sodium: 139 mEq/L (ref 135–145)
Sodium: 139 mEq/L (ref 135–145)

## 2011-03-09 LAB — ABO/RH: ABO/RH(D): O POS

## 2011-03-09 LAB — URINE MICROSCOPIC-ADD ON

## 2011-03-09 LAB — PROTIME-INR
INR: 1.21 (ref 0.00–1.49)
INR: 1.39 (ref 0.00–1.49)
INR: 1.64 — ABNORMAL HIGH (ref 0.00–1.49)
INR: 1.72 — ABNORMAL HIGH (ref 0.00–1.49)
INR: 1.07 (ref 0.00–1.49)
Prothrombin Time: 15.2 seconds (ref 11.6–15.2)
Prothrombin Time: 16.9 s — ABNORMAL HIGH (ref 11.6–15.2)
Prothrombin Time: 19.3 seconds — ABNORMAL HIGH (ref 11.6–15.2)
Prothrombin Time: 20 seconds — ABNORMAL HIGH (ref 11.6–15.2)
Prothrombin Time: 13.8 seconds (ref 11.6–15.2)

## 2011-03-09 LAB — PREPARE RBC (CROSSMATCH)

## 2011-03-09 LAB — APTT: aPTT: 31 s (ref 24–37)

## 2011-03-10 LAB — COMPREHENSIVE METABOLIC PANEL
ALT: 19 U/L (ref 0–35)
AST: 20 U/L (ref 0–37)
Albumin: 4.1 g/dL (ref 3.5–5.2)
Alkaline Phosphatase: 53 U/L (ref 39–117)
BUN: 14 mg/dL (ref 6–23)
CO2: 32 mEq/L (ref 19–32)
Calcium: 9.5 mg/dL (ref 8.4–10.5)
Chloride: 104 mEq/L (ref 96–112)
Creatinine, Ser: 0.82 mg/dL (ref 0.4–1.2)
GFR calc Af Amer: >60 mL/min (ref >60)
GFR calc non Af Amer: >60 mL/min (ref >60)
Glucose, Bld: 113 mg/dL — ABNORMAL HIGH (ref 70–99)
Potassium: 4.9 mEq/L (ref 3.5–5.1)
Sodium: 141 mEq/L (ref 135–145)
Total Bilirubin: 0.5 mg/dL (ref 0.3–1.2)
Total Protein: 7.3 g/dL (ref 6.0–8.3)

## 2011-03-10 LAB — URINALYSIS, ROUTINE W REFLEX MICROSCOPIC
Bilirubin Urine: NEGATIVE
Glucose, UA: NEGATIVE mg/dL
Hgb urine dipstick: NEGATIVE
Ketones, ur: NEGATIVE mg/dL
Nitrite: NEGATIVE
Protein, ur: NEGATIVE mg/dL
Specific Gravity, Urine: 1.011 (ref 1.005–1.030)
Urobilinogen, UA: 0.2 mg/dL (ref 0.0–1.0)
pH: 6 (ref 5.0–8.0)

## 2011-03-10 LAB — URINE MICROSCOPIC-ADD ON

## 2011-03-10 LAB — CBC
HCT: 41.3 % (ref 36.0–46.0)
Hemoglobin: 13.6 g/dL (ref 12.0–15.0)
MCHC: 32.9 g/dL (ref 30.0–36.0)
MCV: 90 fL (ref 78.0–100.0)
Platelets: 396 10*3/uL (ref 150–400)
RBC: 4.59 MIL/uL (ref 3.87–5.11)
RDW: 14 % (ref 11.5–15.5)
WBC: 7.6 10*3/uL (ref 4.0–10.5)

## 2011-03-10 LAB — APTT: aPTT: 30 seconds (ref 24–37)

## 2011-03-10 LAB — PROTIME-INR
INR: 0.93 (ref 0.00–1.49)
Prothrombin Time: 12.4 seconds (ref 11.6–15.2)

## 2011-04-01 ENCOUNTER — Other Ambulatory Visit: Payer: Self-pay | Admitting: *Deleted

## 2011-04-01 MED ORDER — METOPROLOL SUCCINATE ER 25 MG PO TB24: ORAL_TABLET | ORAL | Status: DC

## 2011-04-18 ENCOUNTER — Encounter: Payer: Self-pay | Admitting: Cardiovascular Disease

## 2011-04-24 ENCOUNTER — Encounter: Payer: Self-pay | Admitting: Cardiovascular Disease

## 2011-04-24 ENCOUNTER — Ambulatory Visit (INDEPENDENT_AMBULATORY_CARE_PROVIDER_SITE_OTHER): Payer: Medicare Other | Admitting: Cardiovascular Disease

## 2011-04-24 DIAGNOSIS — E7800 Pure hypercholesterolemia, unspecified: Secondary | ICD-10-CM

## 2011-04-24 DIAGNOSIS — I2510 Atherosclerotic heart disease of native coronary artery without angina pectoris: Secondary | ICD-10-CM

## 2011-04-24 DIAGNOSIS — I10 Essential (primary) hypertension: Secondary | ICD-10-CM

## 2011-04-24 NOTE — Patient Instructions (Signed)
Your physician recommends that you schedule a follow-up appointment in: 1year with Dr. Nishan  

## 2011-04-24 NOTE — Progress Notes (Signed)
Kim Lane is seen today in followup for her coronary artery disease.  She has stents to the LAD and diagonal placed by Dr. Juanda Chance in 2007.  She had a nonischemic Myoview in 4/11 prior to orthopedic surgery .  She will miss her Zocor and aspirin does on occasion.  I explained to her the importance of taking both these medications in reg She is not having any   PND or orthopnea  there's been no claudication or syncope.  She had RTKR by Dr Lequita Halt on 1/12 with no complications   Still struggling with statins.  Failed crestor and lipitor.  Able to take zocor wit LDL of 105 on recent labs from [facility].  Told her it was ok to take a holiday from her statins if she thinks it  Note: From [facility] knows Kim Lane/Kim Lane  ROS: Denies fever, malais, weight loss, blurry vision, decreased visual acuity, cough, sputum, SOB, hemoptysis, pleuritic pain, palpitaitons, heartburn, abdominal pain, melena, lower extremity edema, claudication, or rash.   General: Affect appropriate Healthy:  appears stated age HEENT: normal Neck supple with no adenopathy JVP normal no bruits no thyromegaly Lungs clear with no wheezing and good diaphragmatic motion Heart:  S1/S2 no murmur,rub, gallop or click PMI normal Abdomen: benighn, BS positve, no tenderness, no AAA no bruit.  No HSM or HJR Distal pulses intact with no bruits No edema Neuro non-focal Skin warm and dry No muscular weakness S/P bilateral knee replacements   Current Outpatient Prescriptions  Medication Sig Dispense Refill  . aspirin 81 MG tablet Take 81 mg by mouth daily.        [name] Kitchen glucosamine-chondroitin 500-400 MG tablet Take 1 tablet by mouth daily.        [name] Kitchen levothyroxine (SYNTHROID, LEVOTHROID) 112 MCG tablet Take 112 mcg by mouth daily.        . metoprolol succinate (TOPROL-XL) 25 MG 24 hr tablet Take one half tablet every other day  60 tablet  4  . Multiple Vitamins-Minerals (CENTRUM SILVER PO) Take 1 tablet by mouth daily.        .  nitroGLYCERIN (NITROLINGUAL) 0.4 MG/SPRAY spray Place 1 spray under the tongue every 5 (five) minutes as needed. Spray 1 spray as directed       . Omega-3 Fatty Acids (FISH OIL) 1000 MG CAPS Take 1 capsule by mouth daily.        . simvastatin (ZOCOR) 10 MG tablet Take 10 mg by mouth at bedtime.        [name] Kitchen DISCONTD: levothyroxine (SYNTHROID, LEVOTHROID) 125 MCG tablet Take 125 mcg by mouth daily.        [name] Kitchen DISCONTD: Misc Natural Products (APPLE CIDER VINEGAR) TABS Take 1 tablet by mouth daily.        [name] Kitchen DISCONTD: Red Yeast Rice 600 MG TABS Take 1 tablet by mouth daily.          Allergies  Review of patient's allergies indicates no known allergies.  Electrocardiogram:  NSR 63 normal ECG  Assessment and Plan

## 2011-04-24 NOTE — Assessment & Plan Note (Signed)
No angina continue ASA and BB

## 2011-04-24 NOTE — Assessment & Plan Note (Signed)
Well controlled.  Continue current medications and low sodium Dash type diet.    

## 2011-04-24 NOTE — Assessment & Plan Note (Signed)
Cholesterol is at goal.  Continue current dose of statin and diet Rx.  No myalgias or side effects.  F/U  LFT's in 6 months. No results found for this basename: LDLCALC  LDL 105 normal LFT;s see scanned labs form [facility]

## 2011-05-05 NOTE — Assessment & Plan Note (Signed)
[facility]                            CARDIOLOGY OFFICE NOTE   Kim Lane, Kim Lane                       MRN:          [id number]  DATE:01/08/2009                            DOB:          1934/03/21    Kim Lane returns today for followup.  She was at [facility] about a week  ago for chest pain.  She was shopping with her daughter.  She had  central chest pain.  It was quite severe, radiate to the back.  She went  back to the car and took a nitroglycerin, which was old approximately 60-  80 years old.  She then had a strange floating sensation.  I suspect, she  got a little hypotensive.  She normally only run systolic pressures of  100.  She was admitted overnight to [facility].  The Myoview  study was nonischemic.  Carotids showed no disease.  Echo showed normal  LV function.  She was discharged and has not had a recurrence.  Dr.  Juanda Chance had placed a stent to her LAD diagonal, back in 2007.  She did  not have other residual disease.   She had a normal Myoview study here in June.   Talking to the patient, she is active.  She has not had any recurrence  of pain.  This was a first episode of pain.  She had ever had since her  stent.   There was no associated shortness of breath or diaphoresis.  She did  feel presyncopal after taking her nitro.   Her workup at [facility] was otherwise negative.  She ruled out for  myocardial infarction.  Her potassium was little high.  She appeared to  be dehydrated and was given fluids.   She was asked to follow up here.  Talking to the patient, she feels  normal now.  She is active.  She does all ADLs.  She drives herself  every where.   She happened  to be shopping in [place], but her home in [place] is  just as close to [place] and she normally follows up here.   REVIEW OF SYSTEMS:  Otherwise, negative.   CURRENT MEDICATIONS:  1. Toprol 12.5 a day.  2. Synthroid 137 mcg a day.  3. Simvastatin 20  a day.  4. Fish oil.  5. Aspirin.   She uses Counselling psychologist.   PHYSICAL EXAMINATION:  GENERAL:  Remarkable for a pleasant female in no  distress.  Affect is appropriate.  VITAL SIGNS:  Respiratory rate 12, blood pressure 100/70, pulse 70 and  regular, respiratory rate 14, and afebrile.  HEENT:  Unremarkable.  NECK:  Carotids are normal without bruit.  No lymphadenopathy,  thyromegaly, or JVP elevation.  LUNGS:  Clear.  Good diaphragmatic motion.  No wheezing.  HEART:  S1 and S2.  Normal heart sounds.  PMI normal.  ABDOMEN:  Benign.  Bowel sounds positive.  No AAA.  No tenderness.  No  bruit.  No hepatosplenomegaly.  No hepatojugular reflux.  EXTREMITIES:  Distal pulses are intact.  No edema.  NEUROLOGIC:  Nonfocal.  SKIN:  Warm and dry.  MUSCULOSKELETAL:  No muscular weakness.   Lab work was reviewed from [facility]  Her hematocrit was 33.8.  Her  thyroid was 4.7.  D-dimer was negative at 0.29.  Echo was normal with an  EF of 65%.  A Myoview study was normal with no evidence of ischemia.   IMPRESSION:  1. Chest pain, previous history of stent the left anterior descending      coronary artery.  Negative workup at [facility].  Continue to follow.      The patient will maintain aspirin, low-dose beta-blocker, new      nitroglycerin, will be called-in [facility].  She      will call me, if she has any further pain.  I do not think there is      currently an indication for catheter and addition of Plavix.  2. Hypercholesterolemia in the setting coronary artery disease.      Continue statin therapy.  Lipid and liver profile per primary care      MD Dr. Nathanial Rancher.  3. Hypothyroidism.  Continue Synthroid.  TSH and T4 normal at recent      hospitalization.  4. Hypercholesterolemia, in the setting of coronary artery disease.      Continue simvastatin.  5. Overall, I think Kim Lane is doing well.  I am not sure why she had      pain, while shopping a week ago.  If it is  recurrent, she will need      a heart cath and knows this.  I will see her in 3 months.     Noralyn Pick. Eden Emms, MD, [facility]  Electronically Signed    PCN/MedQ  DD: 01/08/2009  DT: 01/08/2009  Job #: [id number]

## 2011-05-05 NOTE — Assessment & Plan Note (Signed)
[facility]                            CARDIOLOGY OFFICE NOTE   ETSUKO, DIEROLF                       MRN:          [id number]  DATE:05/23/2008                            DOB:          1934/03/06    Ms. Capurro returns today for follow up.  She is 77 years old and we have  not seen her in about 2 years.   The patient is referred back from Dr. Artis Flock.   She had PTCA and stenting of the LAD diagonal with Taxus stents in 2007  by Dr. Juanda Chance.   She did not have other residual disease.  She has been doing well.  She  has not been seen since that time.  The patient did have an episode of  chest pain about a month ago.  It was atypical, that woke her up at  night and it lasted about an hour.  She could not get her nitro pill  bottle opened due to arthritis in her hands.  I told her we would get  her some nitro spray to take instead since that cap is easier to get  off.   The pain was not associated with any nausea or vomiting.  There was no  associated palpitations, PND, orthopnea, and no lower extremity edema.   It has not recurred.   The patient does try to be active.  She walks on a regular basis and  tries to keep up with her 3 grandchildren.   Her review of system is remarkable for some insomnia.  She thinks it is  related to an increase in her Synthroid dose.  As I recall, her TSH  tends to run between 5 and 10, and she recently had her thyroid dose  decreased from 150 to 125.   Other than this, she is still getting used to Dr. Artis Flock.  She had a  great relationship with Margrett Rud.  I reassured her that Dr. Artis Flock  is a wonderful physician and over time they will develop the same  relationship.   Otherwise, her review of systems is negative.   MEDICATIONS:  1. An aspirin a day.  2. Zocor 20 a day.  3. Plavix 75 a day.  4. Fish oil.  5. Centrum.  6. Glucosamine.  7. Synthroid 125 mcg a day.  8. Toprol 25 a day.   EXAMINATION:   Remarkable for an elderly white female in no distress.  Affect is appropriate.  Weight is 151, respiratory rate is 14, blood  pressure 122/78, pulse 72 and regular.  Afebrile.  HEENT:  Unremarkable.  Carotids normal without bruit.  No lymphadenopathy, thyromegaly, or JVP  elevation.  LUNGS:  Clear, good diaphragmatic motion.  No wheezing.  S1, S2 with normal heart sounds.  PMI is normal.  ABDOMEN:  Benign.  Bowel sounds positive.  No AAA.  No tenderness.  No  bruit.  No hepatosplenomegaly.  No hepatojugular reflux.  No tenderness  Distal pulses intact.  No edema.  NEURO:  Nonfocal.  SKIN:  Warm and dry.  No muscular weakness.   EKG is  entirely normal.   IMPRESSION:  1. Chest pain, history of coronary disease with stents to the left      anterior descending.  Followup adenosine Myoview.  I think the      patient's arthritis would prohibit her from doing a good stress      test.  2. Hypercholesterolemia, only on 20 of Zocor.  We will try to get the      lab results from Dr. Blair Heys office.  I suspect she should be on a      little bit stronger medication.  I told her her target goal for her      LDL would be less than 70.  She is not having any significant      myalgias.  3. Hypertension, currently well controlled.  Continue her current dose      of Toprol.  4. Arthritis.  Continue nonsteroidals and glucosamine chondroitin.  5. Hypothyroidism.  Followup TSH.  She seems to be more tolerant to      the 125 mcg dose in regards to her insomnia.   Overall I will see the patient back in a year or so, as long as her  stress test is at low risk.     Noralyn Pick. Eden Emms, MD, [facility]  Electronically Signed    PCN/MedQ  DD: 05/23/2008  DT: 05/23/2008  Job #: [id number]

## 2011-05-08 NOTE — H&P (Signed)
[name], Lane NO.:  [id number]   MEDICAL RECORD NO.:  [id number]          PATIENT TYPE:  INP   LOCATION:  3703                         FACILITY:  [facility]   PHYSICIAN:  Charlton Haws, M.D.     DATE OF BIRTH:  26-Dec-1933   DATE OF ADMISSION:  03/01/2006  DATE OF DISCHARGE:                                HISTORY & PHYSICAL   PRIMARY CARDIOLOGIST:  Dr. Charlton Haws.   REASON FOR ADMISSION:  [name] is a 77 year old female, with a history  of nonobstructive coronary artery disease and cardiac risk factors notable  for hyperlipidemia, family history and age, who now presents to the  emergency room directly from Dr. Shane Crutch office for evaluation of  severe angina pectoris.   While at church around noon yesterday, the patient developed severe, 8/10  midsternal chest pain much more severe than any chest pain she has  experienced in the past. There was no initial radiation nor any associated  diaphoresis, dyspnea, nausea/vomiting. The initial episode lasted  approximately two minutes and resolved spontaneously. However, while walking  back to the church, she experienced upper right mandibular pain which lasted  a total of approximately 10 minutes. She drove herself home, took some  aspirin, and has had no recurrent chest discomfort. She has, however, felt  weak since yesterday's initial episode.   The patient does report having had chest pain in the past and has had two  previous cardiac catheterizations: The first in 1980 by Dr. Alanda Amass and a  second in 1995, by Dr. Charlton Haws, revealing nonobstructive coronary  artery disease. An exercise stress Cardiolite in 2003 showed normal  perfusion; EF 68%.   The patient denies any recent development of exertional chest discomfort or  dyspnea.   ALLERGIES:  No known drug allergies.   HOME MEDICATIONS:  1.  Simvastatin 20 milligrams q.h.s.  2.  Synthroid 0.112 daily.  3.  Premarin every 4-5 days as  directed.   PAST MEDICAL HISTORY:  1.  Hypercholesterolemia.  2.  Hypothyroidism.  3.  Irritable bowel syndrome.  4.  History of gout.  5.  Remote peptic ulcer disease (approximately four to five years ago).  6.  Status post partial hysterectomy and a negative abdominal ultrasound      March 2005.   SOCIAL HISTORY:  The patient lives in [place], alone. She has two grown  children. She has never smoked tobacco on a regular basis. Drinks alcohol  only rare occasion. She reports being extremely active.   FAMILY HISTORY:  Mother deceased at age 54, with no known heart disease.  Father deceased at age 25, with history of angina.  Siblings have no known  heart disease.   REVIEW OF SYSTEMS:  The patient denies any history of myocardial infarction,  congestive heart failure, or stroke. She denies any recent orthopnea,  paroxysmal nocturnal dyspnea, lower extremity edema, or tachypalpitations.  Denies any recent evidence of upper or lower GI bleeding. No recent fevers,  chills or productive cough. Bilateral knee arthralgia but with no formal  diagnosis of arthritis. No current symptoms of  GERD. Otherwise as noted per  HPI, remaining systems negative.   PHYSICAL EXAMINATION:  VITAL SIGNS: Blood pressure 115/78, pulse 69 and  regular, respirations 18, temperature 97.0, sats 98% room air.  GENERAL: A 77 year old female in no apparent distress.  HEENT: Normocephalic, atraumatic.  NECK: Preserved bilateral carotid pulse without bruits; no JVD.  LUNGS: Clear to auscultation in all fields.  HEART: Regular rate and rhythm (S1 and S2). No murmurs, rubs or gallops.  ABDOMEN: Soft, nontender.  EXTREMITIES:  Palpable bilateral femoral pulses without bruits; Intact  distal pulses without edema. No known focal deficit.   Admission chest x-ray: Pending. Admission electrocardiogram: Normal sinus  rhythm at 62 BPM with normal axis; no ischemic changes. Laboratory data:  Pending.   IMPRESSION:  1.   Stable angina pectoris.  2.  Nonobstructive coronary artery disease.      1.  Status post cardiac catheterization 1980 and 1995.      2.  Normal exercise stress Cardiolite; ejection fraction 68%, May 2003.  3.  Hyperlipidemia.  4.  Hypothyroidism.  5.  Remote peptic ulcer disease.   PLAN:  The patient will be admitted to telemetry unit for further evaluation  and rule out of myocardial infarction. Her symptoms are worrisome for  ischemia and recommendation is to proceed with diagnostic coronary  angiography tomorrow for definitive exclusion of significant coronary artery  disease. The patient is agreeable with this plan and risks/benefits have  been discussed. We will, however, check a D-dimer as well to exclude  pulmonary embolus.   Medication therapy will consist of aspirin and Lopressor 25 b.i.d. The  patient will be started on intravenous heparin and we will add IV  nitroglycerin if she has any recurrent chest pain. In addition to cycling  cardiac markers, we will check a complete metabolic panel, fasting lipid  profile, and TSH level.      Gene Serpe, P.A. [facility]    ______________________________  Charlton Haws, M.D.    GS/MEDQ  D:  03/01/2006  T:  03/01/2006  Job:  [id number]   cc:   Duffy Rhody C. Andrey Campanile, M.D.  Fax: [phone]

## 2011-05-08 NOTE — Cardiovascular Report (Signed)
[name], Lane NO.:  [id number]   MEDICAL RECORD NO.:  [id number]          PATIENT TYPE:  INP   LOCATION:  6523                         FACILITY:  [facility]   PHYSICIAN:  Jonelle Sidle, M.D. LHCDATE OF BIRTH:  Feb 23, 1934   DATE OF PROCEDURE:  03/02/2006  DATE OF DISCHARGE:                              CARDIAC CATHETERIZATION   Dr. Diona Browner the cardiac catheterization report on an T12-4 and [id number].   REQUESTING CARDIOLOGIST:  Dr. Charlton Haws.   PRIMARY CARE PHYSICIAN:  Dr. Margrett Rud.   INDICATIONS:  Kim Lane is a 77 year old woman with a previously  documented history of nonobstructive coronary disease at catheterization in  1995. Additional history includes hyperlipidemia, remote peptic ulcer  disease and hypothyroidism. She presents now with symptoms consistent with  unstable angina and to rule out for myocardial infarction. This study is  requested to define the coronary anatomy. The risks and benefits were  explained to the patient and informed consent was obtained prior to  proceeding.   PROCEDURES PERFORMED:  1.  Left heart catheterization.  2.  Selective coronary angiography.  3.  Left ventriculography.   ACCESS AND EQUIPMENT:  The area about the right femoral artery was  anesthetized 1% lidocaine. A 6-French sheath was placed in the right femoral  artery via the modified Seldinger technique. Standard preformed 6-French JL-  4 and JR-4 catheters were used for selective coronary angiography and an  angled pigtail catheter was used for left heart catheterization and left  ventriculography. A total of 105 cc of Omnipaque were used. All exchanges  were made over wire and the patient tolerated the procedure well without  immediate complications.   HEMODYNAMIC RESULTS:  1.  Aorta 127/63 mmHg. Left ventricle 127/13 mmHg.   ANGIOGRAPHIC FINDINGS:  1.  Left main coronary artery is small to medium in caliber and there is  approximately 30% ostial stenosis noted. This vessel gives rise to left      anterior descending and circumflex coronary arteries.  2.  The left anterior descending is a medium-caliber vessel with proximal      large diagonal branch that approximates the same size of the left      anterior descending proper. There is an 80% stenosis in the proximal      portion of the large diagonal branch. The mid-left anterior descending      is diffusely diseased at approximately 70%. The distal circumflex vessel      tapers to small caliber.  3.  The circumflex coronary artery is a medium-caliber vessel with two      obtuse marginal branches. The second of which is the largest and      bifurcates. There is approximately 30-40% proximal stenosis noted within      the circumflex.  4.  The right coronary artery is a medium-caliber vessel with posterior      descending branch. There is 50-60% focal stenosis in the proximal      portion of the vessel. The other minor luminal irregularities are noted      in the mid and distal segment  with approximately 30% stenosis in the      posterolateral system and a 40% ostial stenosis involving the posterior      descending branch.   Left ventriculography was performed in the RAO projection. It revealed an  ejection fraction of approximately 55-60% with no focal anterior inferior  wall motion abnormality. No significant mitral vegetation.   DIAGNOSES:  1.  Coronary disease as outlined including an 80% large proximal diagonal      stenosis, 60% diffuse mid-left anterior descending stenosis, and a 50-      60% proximal right coronary stenosis with other mild disease.  2.  Left ventricular ejection fraction estimated at 55-60% with no      significant mitral regurgitation. A left ventricular end-diastolic      pressure of 30 mmHg and no significant transaortic valve gradient.   DISCUSSION:  I reviewed the results with the patient and her family. Plan at  this point  will be to have Dr. Juanda Chance review the films and we can determine  a reasonable revascularizations strategy.           ______________________________  Jonelle Sidle, M.D. [facility]     SGM/MEDQ  D:  03/02/2006  T:  03/02/2006  Job:  [id number]   cc:   Charlton Haws, M.D.  [address]  [address]  [place]  [place] [address]   [place]. Andrey Campanile, M.D.  Fax: [phone]

## 2011-05-08 NOTE — Cardiovascular Report (Signed)
NAMECEDAR, ROSEMAN NO.:  [id number]   MEDICAL RECORD NO.:  [id number]          PATIENT TYPE:  INP   LOCATION:  2807                         FACILITY:  [facility]   PHYSICIAN:  Charlies Constable, M.D. [facility] DATE OF BIRTH:  23-Mar-1934   DATE OF PROCEDURE:  03/02/2006  DATE OF DISCHARGE:                              CARDIAC CATHETERIZATION   CLINICAL HISTORY:  [name] is 77 years old and has a history of  nonobstructive coronary disease. She recently developed chest pain and had  rest pain this weekend. She was seen in the office yesterday by Dr. Andrey Campanile  and sent to the hospital for admission. She was studied earlier today by Dr.  Diona Browner and was found to have severe disease in the mid LAD which was  segmental and also severe disease in a large diagonal branch very near the  ostium. She also had about a 70% lesion in the right coronary artery. The  LAD was somewhat small caliber and we debated between bypass surgery versus  percutaneous coronary intervention and decided on percutaneous coronary  intervention.   PROCEDURE:  The procedure was performed via the right femoral artery using  arteria sheath and a 6-French Q 3.5 guiding catheter with side holes. After  taking initial pictures we felt the vessel was fairly heavily calcified and  should first be treated with rotational atherectomy. The patient was given  Angiomax bolus and infusion and she was enrolled in the Purcell trial and  was randomized to either Plavix load or Alla Feeling study drug load. We passed  the roto-floppy wire down the LAD. We first went in with a 1.25 burr and  performed three runs at approximately 155,000 RPMs for 10 seconds each.  We  then exchanged for a 1.5 burr and performed approximately three runs at  approximately 155,000 RPMs for 10 seconds each. We then dilated the lesion  with a 2.5 x 20-mm balloon. There was a lesion just downstream that we hoped  to treat with balloon only so that we  could get by with one drug-eluting  stent. We next deployed a 2.5 x 20-mm  Taxus stent, deploying this with one  inflation of 9 atmospheres for 20 seconds. We then post dilated with a 2.5 x  15-mm Quantum Maverick performing two inflations up to 14 atmospheres by 30  seconds. We then passed the second wire down the diagonal branch. We  predilated with a 2.25 x 20 Maverick with one inflation up to six  atmospheres by 30 seconds. We then deployed a 2.5 x 8-mm Taxus stent  deploying this short of the ostium. We followed this with one inflation of  11 atmospheres by 30 seconds.   We then took some more pictures and realized that the lesion downstream from  the stent in the mid LAD had a filling defect and we felt needed to be  treated. For this reason we passed an additional 2.5 x 12-mm Taxus stent  distal to the stent in the mid LAD and overlapping this stent. We followed  this with one inflation of 10 atmospheres for 30  seconds. We post dilated  with a 2.5 x 15-mm Quantum Maverick, climbing one inflation up to 16  atmospheres by 30 seconds. Final diagnostic study was then performed through  the guiding catheter.   The procedure was long, but the patient overall tolerated the procedure well  and left the laboratory in satisfactory condition.   RESULTS:  Initially the stenosis in the LAD was 90% at its most severe focal  portion. Following placement of overlapping Taxus stents this improved to  0%. There was some residual disease of 40% in the proximal LAD.   The diagonal branch initially was 90% and following stenting this improved  to less than 10%.   CONCLUSION:  1.  Successful percutaneous coronary intervention of the long segmental      lesions in the mid LAD with two overlapping Taxus stents with      improvement in sentinel narrowing from 90% to 0%.  2.  Successful percutaneous coronary intervention of the near ostial lesion      of the diagonal branch of the LAD with a Taxus  drug-eluting stent with      improvement of sentinel narrowing from 90% to less than 10%.   DISPOSITION:  The patient is taken to the postanesthesia care unit for  further observation. She should remain on long-term Plavix for the risk of  stent thrombosis in her situation will be higher than usual.           ______________________________  Charlies Constable, M.D. [facility]     BB/MEDQ  D:  03/02/2006  T:  03/03/2006  Job:  [id number]   cc:   Duffy Rhody C. Andrey Campanile, M.D.  Fax: [phone]   Jonelle Sidle, M.D. [facility]  [address]  [place]  [place] [address]

## 2011-05-08 NOTE — Assessment & Plan Note (Signed)
[facility]                              CARDIOLOGY OFFICE NOTE   Kim Lane, Kim Lane                      MRN:          [id number]  DATE:10/19/2006                            DOB:          [birth year]    Kim Lane returns today for followup.  She is doing well.  She is status post  PTCA angioplasty of LAD diagonal with Taxus stents.  She has compliant with  her aspirin and Plavix.   She has not had any significant angina.  She has some increasing fatigue.  She is not sure if this is just her age or is something else.  She does have  a history of hypothyroidism and I will check her TSH and T4.   From a cardiac perspective she is otherwise doing well.  She is on Zocor for  hyperlipidemia.  She continues to be on Premarin estrogen replacement and  really does not want to come off this due to the previous severe hot flashes  that she had.  Her medications are listed in the chart.   REVIEW OF SYSTEMS:  Otherwise negative.   EXAMINATION:  The skin is warm and dry.  HEENT is normal.  Blood pressure is  110/68, pulse 65 and regular, weight is stable at 148.  There is no  thyromegaly, there is no lymphadenopathy.  There is an S1 and S2 with normal  heart sounds.  Lungs are clear, abdomen is benign, lower extremities with  intact pulses, no edema.  Neurologic is nonfocal.   IMPRESSION:  Stable percutaneous transluminal coronary angioplasty and  stenting of the diagonal left anterior descending artery with Taxus stents  in March 2007.  Continue aspirin and Plavix.  No evidence of chest pain.   The patient will have a CBC, platelet count, LFTs, BMET, and TSH, T4 to  further work up some of her fatigue and malaise.  She does not look sick to  me and I suspect these lab tests will be within the normal range.  She will  continue her current medications.  I will see her back in about 6 months.    ______________________________  Noralyn Pick. Eden Emms, MD, [facility]    PCN/MedQ  DD: 10/19/2006  DT: 10/19/2006  Job #: [id number]

## 2011-05-08 NOTE — Discharge Summary (Signed)
[name], [name] NO.:  [id number]   MEDICAL RECORD NO.:  [id number]          PATIENT TYPE:  INP   LOCATION:  6523                         FACILITY:  [facility]   PHYSICIAN:  Charlton Haws, M.D.     DATE OF BIRTH:  1934-10-28   DATE OF ADMISSION:  03/01/2006  DATE OF DISCHARGE:  03/04/2006                           DISCHARGE SUMMARY - REFERRING   DISCHARGING PHYSICIAN:  Charlies Constable, M.D.   DISCHARGE DIAGNOSES:  1.  Unstable angina, status post stenting x2 to the left anterior descending      and stenting x1 to the diagonal. Catheterization complicated by right      femoral artery pseudoaneurysm status post compression.  2.  Hyperlipidemia.  3.  History as noted below.   PROCEDURE PERFORMED:  Cardiac catheterization and stenting on March 02, 2006, by Dr. Diona Browner and Dr. Juanda Chance.   HISTORY:  [name] is a 77 year old white female who was referred from  Dr. Tawana Scale office to the emergency room with chest discomfort.  While at  church, on February 28, 1006, around noon, she developed severe, 8/10 mid  sternal chest discomfort without initial radiation or diaphoresis, shortness  of breath, nausea or vomiting.  Initial episode lasted approximately two  minutes and resolved spontaneously.  However, back to the church, she  experienced right mandibular discomfort which lasted approximately 10  minutes.  She drove herself home, took some aspirin and has not had any  reoccurring episodes, however, she has felt weak.  After being seen in Dr.  Tawana Scale office, she was sent to the emergency room for further evaluation.   PAST MEDICAL HISTORY:  1.  Hyperlipidemia.  2.  Nonobstructive coronary artery disease on prior cardiac catheterizations      in 1980 by Dr. Alanda Amass, in 1995 by Dr. Eden Emms.  3.  Stress Cardiolite in 2003, showed an EF of 68% and normal perfusion.  4.  History of irritable bowel syndrome.  5.  Gout.  6.  Remote peptic ulcer disease.  7.  Status  post hysterectomy.   ALLERGIES:  NO KNOWN DRUG ALLERGIES.   LABORATORY DATA:  Chest x-ray showed chronic changes, no acute findings.   Admission H&H was 13.5 and 40.7, normal indices, platelets 418, wbc 6.  Subsequent hematologies were all unremarkable.  On March 03, 2006, H&H was  12.6 and 37.5, normal indices, platelets 366, wbc 8.1.  Admission PTT was  29, PT 13.3.  Sodium 140, potassium 3.9, BUN 16, creatinine 0.9, normal  LFTs, glucose 102.  Subsequent chemistries were unremarkable.  On March 03, 2006, sodium 138, potassium 3.8, BUN 9, creatinine 0.9, normal LFTs.  Initial CK-MB was 261 and 3.8 with relative index of 1.5 and troponin less  than 0.01.  Subsequent CK-MBs and troponins were negative.  Post procedure,  MBs and relative indices were slightly elevated at 4.3 and 3.4, however, CK  total was within normal limits.  Post procedure troponins were slightly  elevated at 0.19, 0.45, and 0.44.  TSH was 1.134.  Fasting lipids on March 02, 2006, showed total cholesterol 144, triglycerides 97, HDL  46, LDL 79.   EKG showed normal sinus rhythm, normal axis, early R-wave.  Subsequently  EKGs were the same.   HOSPITAL COURSE:  [name] was admitted to [facility] by Rozell Searing, P.A., and Charlton Haws, M.D.  She was placed on IV  heparin as well as beta-blocker and her home medications.  She ruled out for  myocardial infarction.  Cardiac catheterization performed on March 02, 2006,  by Dr. Diona Browner,  showed an EF of 55 to 60% without wall motion  abnormalities.  A proximal 50 to 60% RCA lesion, distal PDA and PL lesions  of 40 and 30%.  She had a 30% left main, 30 to 40% proximal circumflex.  LAD  was calcified with a 60 to 70% diffuse lesion in the proximal mid portion,  80% proximal diagonal 1.  After reviewing films with Dr. Juanda Chance, TAXUS  stenting x2 was performed to the LAD and TAXUS stenting to the diagonal was  performed reducing all lesions from 90% to  0%.  Post procedure, patient felt  weak.  Discharge was anticipated, however, after her bed rest, patient was  up out of bed to use the bathroom.  When she returned, she developed a right  groin  hematoma.  Pressure was held.  The following morning on March 03, 2006, she complained of some discomfort to the groin and Dr. Juanda Chance  appreciated a bruit.  Ultrasound was obtained and was noted for  pseudoaneurysm. This was compressed without difficulty.  On the morning of  March 04, 2006, Dr. Juanda Chance reassessed and felt that the patient could be  discharged home, after repeat ultrasound confirming closure of her  pseudoaneurysm.   DISPOSITION:  [name] is discharged home on a low salt, fat and  cholesterol diet.  Her activity is restricted for approximately three days  in regard to heavy lifting, driving, sexual activity.  Wound care per the  supplemental discharge sheet.   MEDICATIONS:  1.  Aspirin 325 mg daily.  2.  Synthroid 112 mcg daily.  3.  Zocor 20 mg nightly.  4.  Plavix 75 mg daily.  5.  Toprol XL 25 mg daily.  6.  Nitroglycerin 0.4 as needed.  7.  Premarin as previously.  She was asked to bring all medications to all appointments.   FOLLOW UP:  She will follow up with Dr. Fabio Bering physician assistant,  Wende Bushy, on March 17, 2006, at 10:15 a.m.  She was asked to follow up  with Dr. Andrey Campanile as needed.   DISCHARGE TIME:  Less than 30 minutes.      Joellyn Rued, P.A. [facility]    ______________________________  Charlton Haws, M.D.   EW/MEDQ  D:  03/04/2006  T:  03/05/2006  Job:  [id number]   cc:   [place]. Andrey Campanile, M.D.  Fax: [phone]

## 2011-05-30 ENCOUNTER — Emergency Department (HOSPITAL_COMMUNITY): Payer: Medicare Other

## 2011-05-30 ENCOUNTER — Emergency Department (HOSPITAL_COMMUNITY)
Admission: EM | Admit: 2011-05-30 | Discharge: 2011-05-30 | Disposition: A | Discharge status: Home / Self Care | Payer: Medicare Other | Attending: Emergency Medicine | Admitting: Emergency Medicine

## 2011-05-30 DIAGNOSIS — Z79899 Other long term (current) drug therapy: Secondary | ICD-10-CM | POA: Insufficient documentation

## 2011-05-30 DIAGNOSIS — I10 Essential (primary) hypertension: Secondary | ICD-10-CM | POA: Insufficient documentation

## 2011-05-30 DIAGNOSIS — E039 Hypothyroidism, unspecified: Secondary | ICD-10-CM | POA: Insufficient documentation

## 2011-05-30 DIAGNOSIS — Z7982 Long term (current) use of aspirin: Secondary | ICD-10-CM | POA: Insufficient documentation

## 2011-05-30 DIAGNOSIS — I2510 Atherosclerotic heart disease of native coronary artery without angina pectoris: Secondary | ICD-10-CM | POA: Insufficient documentation

## 2011-05-30 DIAGNOSIS — M1990 Unspecified osteoarthritis, unspecified site: Secondary | ICD-10-CM | POA: Insufficient documentation

## 2011-05-30 DIAGNOSIS — R072 Precordial pain: Secondary | ICD-10-CM | POA: Insufficient documentation

## 2011-05-30 DIAGNOSIS — R61 Generalized hyperhidrosis: Secondary | ICD-10-CM | POA: Insufficient documentation

## 2011-05-30 LAB — COMPREHENSIVE METABOLIC PANEL WITH GFR
ALT: 13 U/L (ref 0–35)
AST: 20 U/L (ref 0–37)
Albumin: 3.6 g/dL (ref 3.5–5.2)
Alkaline Phosphatase: 61 U/L (ref 39–117)
BUN: 16 mg/dL (ref 6–23)
CO2: 30 meq/L (ref 19–32)
Calcium: 8.9 mg/dL (ref 8.4–10.5)
Chloride: 102 meq/L (ref 96–112)
Creatinine, Ser: 0.76 mg/dL (ref 0.4–1.2)
GFR calc Af Amer: >60 mL/min
GFR calc non Af Amer: >60 mL/min
Glucose, Bld: 108 mg/dL — ABNORMAL HIGH (ref 70–99)
Potassium: 4.3 meq/L (ref 3.5–5.1)
Sodium: 139 meq/L (ref 135–145)
Total Bilirubin: 0.5 mg/dL (ref 0.3–1.2)
Total Protein: 6.6 g/dL (ref 6.0–8.3)

## 2011-05-30 LAB — CBC
HCT: 36.6 % (ref 36.0–46.0)
Hemoglobin: 12.3 g/dL (ref 12.0–15.0)
MCH: 28.4 pg (ref 26.0–34.0)
MCHC: 33.6 g/dL (ref 30.0–36.0)
MCV: 84.5 fL (ref 78.0–100.0)
Platelets: 383 K/uL (ref 150–400)
RBC: 4.33 MIL/uL (ref 3.87–5.11)
RDW: 15.1 % (ref 11.5–15.5)
WBC: 7.2 K/uL (ref 4.0–10.5)

## 2011-05-30 LAB — CK TOTAL AND CKMB (NOT AT ARMC)
CK, MB: 4.9 ng/mL — ABNORMAL HIGH (ref 0.3–4.0)
Relative Index: 1.8 (ref 0.0–2.5)
Total CK: 265 U/L — ABNORMAL HIGH (ref 7–177)

## 2011-05-30 LAB — TROPONIN I: Troponin I: <0.3 ng/mL

## 2011-06-01 ENCOUNTER — Telehealth: Payer: Self-pay | Admitting: Cardiovascular Disease

## 2011-06-01 NOTE — Telephone Encounter (Signed)
Pt had chest pain went to ED Saturday and wants to be seen today

## 2011-06-01 NOTE — Telephone Encounter (Signed)
Spoke w/pt she states she was working out in her building w/her daughter on Sat and dev "crushing" pain in center of chest she went in and took ASA 325 and 1 spray of ntg with relief, she went to the local fire department to get checked out and was sent to ER, she states things were ok but they wanted to just keep her overnight however she felt better and refused promising she would f/u w/us asap. Since then she has been ok no more chest pain, she does state she may be a little more fatigued than normal but otherwise ok but would like to f/u w/Dr Eden Emms soon, appt sch for tom at 4pm

## 2011-06-02 ENCOUNTER — Encounter: Payer: Self-pay | Admitting: Cardiovascular Disease

## 2011-06-02 ENCOUNTER — Ambulatory Visit (INDEPENDENT_AMBULATORY_CARE_PROVIDER_SITE_OTHER): Payer: Medicare Other | Admitting: Cardiovascular Disease

## 2011-06-02 DIAGNOSIS — I10 Essential (primary) hypertension: Secondary | ICD-10-CM

## 2011-06-02 DIAGNOSIS — E7800 Pure hypercholesterolemia, unspecified: Secondary | ICD-10-CM

## 2011-06-02 DIAGNOSIS — I2510 Atherosclerotic heart disease of native coronary artery without angina pectoris: Secondary | ICD-10-CM

## 2011-06-02 DIAGNOSIS — R040 Epistaxis: Secondary | ICD-10-CM

## 2011-06-02 NOTE — Progress Notes (Signed)
Kim Lane is seen today in followup for her coronary artery disease. She has stents to the LAD and diagonal placed by Dr. Juanda Chance in 2007. She had a nonischemic Myoview in 4/11 prior to orthopedic surgery . She will miss her Zocor and aspirin does on occasion. I explained to her the importance of taking both these medications in reg She is not having any PND or orthopnea there's been no claudication or syncope. She had RTKR by Dr Lequita Halt on 1/12 with no complications Still struggling with statins. Failed crestor and lipitor. Able to take zocor wit LDL of 105 on recent labs from [place]. Told her it was ok to take a holiday from her statins if she thinks it  Seen in [facility] ER 6/16 with SSCP  R/O no ECG changes.  Discussed with her need for myovue since she left ER AMA  Also has had increasing epistaxis from right nares.  No inciting events.  Only on 81mg  ASA  Note: From [place] knows Allison/Brenda  ROS: Denies fever, malais, weight loss, blurry vision, decreased visual acuity, cough, sputum, SOB, hemoptysis, pleuritic pain, palpitaitons, heartburn, abdominal pain, melena, lower extremity edema, claudication, or rash.  All other systems reviewed and negative  General: Affect appropriate Healthy:  appears stated age HEENT: normal Neck supple with no adenopathy JVP normal no bruits no thyromegaly Lungs clear with no wheezing and good diaphragmatic motion Heart:  S1/S2 no murmur,rub, gallop or click PMI normal Abdomen: benighn, BS positve, no tenderness, no AAA no bruit.  No HSM or HJR Distal pulses intact with no bruits No edema Neuro non-focal Skin warm and dry No muscular weakness   Current Outpatient Prescriptions  Medication Sig Dispense Refill  . aspirin 81 MG tablet Take 81 mg by mouth daily.        [name] Kitchen glucosamine-chondroitin 500-400 MG tablet Take 1 tablet by mouth daily.        [name] Kitchen levothyroxine (SYNTHROID, LEVOTHROID) 112 MCG tablet Take 112 mcg by mouth daily.        .  metoprolol succinate (TOPROL-XL) 25 MG 24 hr tablet Take one half tablet every other day  60 tablet  4  . Multiple Vitamins-Minerals (CENTRUM SILVER PO) Take 1 tablet by mouth daily.        . nitroGLYCERIN (NITROLINGUAL) 0.4 MG/SPRAY spray Place 1 spray under the tongue every 5 (five) minutes as needed. Spray 1 spray as directed       . Omega-3 Fatty Acids (FISH OIL) 1000 MG CAPS Take 1 capsule by mouth daily.        . Red Yeast Rice Extract (RED YEAST RICE PO) Take by mouth daily.        [name] Kitchen DISCONTD: simvastatin (ZOCOR) 10 MG tablet Take 10 mg by mouth at bedtime.          Allergies  Review of patient's allergies indicates no known allergies.  Electrocardiogram:  Assessment and Plan

## 2011-06-02 NOTE — Assessment & Plan Note (Signed)
SSCP requiring ER visit.  Known disease F/U stress myovue

## 2011-06-02 NOTE — Patient Instructions (Signed)
Your physician recommends that you schedule a follow-up appointment in: 6 months  Your physician has requested that you have en exercise stress myoview. For further information please visit [email or web address] Please follow instruction sheet, as given.  You have been referred to an Ear, Nose, and Throat doctor.

## 2011-06-02 NOTE — Assessment & Plan Note (Signed)
Cholesterol is at goal.  Continue current dose of statin and diet Rx.  No myalgias or side effects.  F/U  LFT's in 6 months. No results found for this basename: LDLCALC             

## 2011-06-02 NOTE — Assessment & Plan Note (Signed)
Well controlled.  Continue current medications and low sodium Dash type diet.    

## 2011-06-02 NOTE — Assessment & Plan Note (Signed)
Recurrent right nares bleeding.  Only on ASA  Should be looked into incase she needs Plavix / Effient in future with repeat intervention  Refer ENT

## 2011-06-03 ENCOUNTER — Encounter: Payer: Self-pay | Admitting: *Deleted

## 2011-06-09 ENCOUNTER — Encounter: Payer: Self-pay | Admitting: Cardiovascular Disease

## 2011-06-10 ENCOUNTER — Ambulatory Visit (HOSPITAL_COMMUNITY): Payer: Medicare Other | Attending: Cardiovascular Disease | Admitting: Radiology

## 2011-06-10 VITALS — Ht 64.0 in | Wt 140.0 lb

## 2011-06-10 DIAGNOSIS — R079 Chest pain, unspecified: Secondary | ICD-10-CM

## 2011-06-10 DIAGNOSIS — I2510 Atherosclerotic heart disease of native coronary artery without angina pectoris: Secondary | ICD-10-CM

## 2011-06-10 MED ORDER — REGADENOSON 0.4 MG/5ML IV SOLN
0.4000 mg | Freq: Once | INTRAVENOUS | Status: AC
  Administered 2011-06-10: 0.4 mg via INTRAVENOUS

## 2011-06-10 MED ORDER — TECHNETIUM TC 99M TETROFOSMIN IV KIT
11.0000 | PACK | Freq: Once | INTRAVENOUS | Status: AC | PRN
  Administered 2011-06-10: 11 via INTRAVENOUS

## 2011-06-10 MED ORDER — TECHNETIUM TC 99M TETROFOSMIN IV KIT
33.0000 | PACK | Freq: Once | INTRAVENOUS | Status: AC | PRN
  Administered 2011-06-10: 33 via INTRAVENOUS

## 2011-06-10 NOTE — Progress Notes (Signed)
[facility] SITE 3 NUCLEAR MED [address] [place] [address] [phone]  Cardiology Nuclear Med Study  Kim Lane is a 77 y.o. female [id number] 01-03-1934   Nuclear Med Background Indication for Stress Test:  Evaluation for Ischemia and Post Hospital: 06/06/11 CP, unstable angina History: '07 Heart Catheterization, 04/11 Myocardial Perfusion Study: (-) ischemia EF 75% and '07 Stents: LADx2/DX Cardiac Risk Factors: Hypertension and Lipids  Symptoms:  Chest Pain, Diaphoresis and Palpitations   Nuclear Pre-Procedure Caffeine/Decaff Intake:  None NPO After: 9:00pm   Lungs:  clear IV 0.9% NS with Angio Cath:  20g  IV Site: R Hand  IV Started by:  Irean Hong, RN  Chest Size (in):  36 Cup Size: B  Height: 5\' 4"  (1.626 m)  Weight:  140 lb (63.504 kg)  BMI:  Body mass index is 24.03 kg/(m^2). Tech Comments:  Held toprol x 48 hrs    Nuclear Med Study 1 or 2 day study: 1 day  Stress Test Type:  Treadmill/Lexiscan  Reading MD: Willa Rough, MD  Order Authorizing Provider:  P.Nishan  Resting Radionuclide: Technetium 72m Tetrofosmin  Resting Radionuclide Dose: 11 mCi   Stress Radionuclide:  Technetium 71m Tetrofosmin  Stress Radionuclide Dose: 33 mCi           Stress Protocol Rest HR: 61 Stress HR: 118  Rest BP: 120/62 Stress BP: 160/70  Exercise Time (min): n/a METS: n/a   Predicted Max HR: 145 bpm % Max HR: 81.38 bpm Rate Pressure Product: 21308   Dose of Adenosine (mg):  n/a Dose of Lexiscan: 0.4 mg  Dose of Atropine (mg): n/a Dose of Dobutamine: n/a mcg/kg/min (at max HR)  Stress Test Technologist: Milana Na, EMT-P  Nuclear Technologist:  Domenic Polite, CNMT     Rest Procedure:  Myocardial perfusion imaging was performed at rest 45 minutes following the intravenous administration of Technetium 49m Tetrofosmin. Rest ECG: NSR  Stress Procedure:  The patient received IV Lexiscan 0.4 mg over 15-seconds with concurrent low level  exercise and then Technetium 46m Tetrofosmin was injected at 30-seconds while the patient continued walking one more minute.  There were no significant changes with Lexiscan.  Quantitative spect images were obtained after a 45-minute delay. Stress ECG: No significant change from baseline ECG  QPS Raw Data Images:  Normal; no motion artifact; normal heart/lung ratio. Stress Images:  Normal homogeneous uptake in all areas of the myocardium. Rest Images:  Normal homogeneous uptake in all areas of the myocardium. Subtraction (SDS):  No evidence of ischemia. Transient Ischemic Dilatation (Normal <1.22): .97 Lung/Heart Ratio (Normal <0.45):  .31  Quantitative Gated Spect Images QGS EDV:  52 ml QGS ESV:  12 ml QGS cine images:  Normal Wall Motion QGS EF: 78%  Impression Exercise Capacity:  Lexiscan with no exercise. BP Response:  Normal blood pressure response. Clinical Symptoms:  Weird ECG Impression:  No significant ST segment change suggestive of ischemia. Comparison with Prior Nuclear Study: No significant change from previous study  Overall Impression:  Normal stress nuclear study.   Willa Rough

## 2011-06-11 NOTE — Progress Notes (Signed)
Copy nuc med report routed to Dr. Nishan 06/11/11 Nathian Stencil 

## 2011-06-12 ENCOUNTER — Telehealth: Payer: Self-pay | Admitting: Cardiovascular Disease

## 2011-06-12 NOTE — Telephone Encounter (Signed)
PT AWARE OF MYOVIEW RESULTS./CY 

## 2011-06-12 NOTE — Telephone Encounter (Signed)
Pt really would like her myoview results today she doesn't want to have to go all weekend worrying about this and she was told she would have a call by now

## 2011-06-17 NOTE — Progress Notes (Signed)
pt aware of results Kim Lane  

## 2011-06-18 ENCOUNTER — Other Ambulatory Visit: Payer: Self-pay | Admitting: Internal Medicine

## 2011-06-18 DIAGNOSIS — Z1231 Encounter for screening mammogram for malignant neoplasm of breast: Secondary | ICD-10-CM

## 2011-08-21 ENCOUNTER — Ambulatory Visit
Admission: RE | Admit: 2011-08-21 | Discharge: 2011-08-21 | Disposition: A | Discharge status: Home / Self Care | Payer: Medicare Other | Source: Ambulatory Visit | Attending: Internal Medicine | Admitting: Internal Medicine

## 2011-08-21 DIAGNOSIS — Z1231 Encounter for screening mammogram for malignant neoplasm of breast: Secondary | ICD-10-CM

## 2011-10-30 ENCOUNTER — Telehealth: Payer: Self-pay | Admitting: Cardiovascular Disease

## 2011-10-30 NOTE — Telephone Encounter (Signed)
All Cardiac faxed to [facility] @ [phone]  10/30/11/km

## 2011-11-30 ENCOUNTER — Ambulatory Visit (INDEPENDENT_AMBULATORY_CARE_PROVIDER_SITE_OTHER): Payer: Medicare Other | Admitting: Cardiovascular Disease

## 2011-11-30 ENCOUNTER — Encounter: Payer: Self-pay | Admitting: Cardiovascular Disease

## 2011-11-30 DIAGNOSIS — I2510 Atherosclerotic heart disease of native coronary artery without angina pectoris: Secondary | ICD-10-CM

## 2011-11-30 DIAGNOSIS — I10 Essential (primary) hypertension: Secondary | ICD-10-CM

## 2011-11-30 DIAGNOSIS — E7800 Pure hypercholesterolemia, unspecified: Secondary | ICD-10-CM

## 2011-11-30 MED ORDER — METOPROLOL SUCCINATE ER 25 MG PO TB24: 25.0000 mg | ORAL_TABLET | Freq: Every day | ORAL | Status: DC

## 2011-11-30 NOTE — Progress Notes (Signed)
Kim Lane is seen today in followup for her coronary artery disease. She has stents to the LAD and diagonal placed by Dr. Juanda Chance in 2007. She had a nonischemic Myoview in 4/11 prior to orthopedic surgery . She will miss her Zocor and aspirin does on occasion. I explained to her the importance of taking both these medications in reg She is not having any PND or orthopnea there's been no claudication or syncope. She had RTKR by Dr Lequita Halt on 1/12 with no complications Still struggling with statins. Failed crestor and lipitor. Able to take zocor and red yeast with  LDL of 105 on recent labs from [place].  Had normal myovue 6/12  ROS: Denies fever, malais, weight loss, blurry vision, decreased visual acuity, cough, sputum, SOB, hemoptysis, pleuritic pain, palpitaitons, heartburn, abdominal pain, melena, lower extremity edema, claudication, or rash.  All other systems reviewed and negative  General: Affect appropriate Healthy:  appears stated age HEENT: normal Neck supple with no adenopathy JVP normal no bruits no thyromegaly Lungs clear with no wheezing and good diaphragmatic motion Heart:  S1/S2 no murmur,rub, gallop or click PMI normal Abdomen: benighn, BS positve, no tenderness, no AAA no bruit.  No HSM or HJR Distal pulses intact with no bruits No edema Neuro non-focal Skin warm and dry No muscular weakness   Current Outpatient Prescriptions  Medication Sig Dispense Refill  . aspirin 81 MG tablet Take 81 mg by mouth daily.        [name] Kitchen glucosamine-chondroitin 500-400 MG tablet Take 1 tablet by mouth daily.        [name] Kitchen levothyroxine (SYNTHROID, LEVOTHROID) 112 MCG tablet Take 112 mcg by mouth daily.        . metoprolol succinate (TOPROL-XL) 25 MG 24 hr tablet Take one half tablet every other day  60 tablet  4  . Multiple Vitamins-Minerals (CENTRUM SILVER PO) Take 1 tablet by mouth daily.        . nitroGLYCERIN (NITROLINGUAL) 0.4 MG/SPRAY spray Place 1 spray under the tongue every 5 (five) minutes as  needed. Spray 1 spray as directed       . Omega-3 Fatty Acids (FISH OIL) 1000 MG CAPS Take 1 capsule by mouth daily.        . Red Yeast Rice Extract (RED YEAST RICE PO) Take by mouth daily.        . simvastatin (ZOCOR) 40 MG tablet 1 tab when pt remebers        Allergies  Review of patient's allergies indicates no known allergies.  Electrocardiogram:  Assessment and Plan

## 2011-11-30 NOTE — Assessment & Plan Note (Signed)
Stable with no angina and good activity level.  Continue medical Rx  

## 2011-11-30 NOTE — Assessment & Plan Note (Signed)
Well controlled.  Continue current medications and low sodium Dash type diet.    

## 2011-11-30 NOTE — Patient Instructions (Signed)
Your physician wants you to follow-up in:  6 MONTHS WITH DR NISHAN  You will receive a reminder letter in the mail two months in advance. If you don't receive a letter, please call our office to schedule the follow-up appointment. Your physician recommends that you continue on your current medications as directed. Please refer to the Current Medication list given to you today. 

## 2011-11-30 NOTE — Assessment & Plan Note (Signed)
Compliance with zocor stressed  F/U labs with primary

## 2011-12-18 ENCOUNTER — Encounter: Payer: Self-pay | Admitting: Cardiovascular Disease

## 2012-09-08 ENCOUNTER — Other Ambulatory Visit: Payer: Self-pay | Admitting: Internal Medicine

## 2012-09-08 DIAGNOSIS — Z1231 Encounter for screening mammogram for malignant neoplasm of breast: Secondary | ICD-10-CM

## 2012-09-14 ENCOUNTER — Ambulatory Visit (INDEPENDENT_AMBULATORY_CARE_PROVIDER_SITE_OTHER): Payer: Medicare Other | Admitting: Cardiovascular Disease

## 2012-09-14 ENCOUNTER — Ambulatory Visit: Payer: Medicare Other | Admitting: Cardiovascular Disease

## 2012-09-14 ENCOUNTER — Ambulatory Visit
Admission: RE | Admit: 2012-09-14 | Discharge: 2012-09-14 | Disposition: A | Discharge status: Home / Self Care | Payer: Medicare Other | Source: Ambulatory Visit | Attending: Cardiovascular Disease | Admitting: Cardiovascular Disease

## 2012-09-14 ENCOUNTER — Encounter: Payer: Self-pay | Admitting: Cardiovascular Disease

## 2012-09-14 VITALS — BP 120/68 | HR 77 | Ht 64.0 in | Wt 139.8 lb

## 2012-09-14 DIAGNOSIS — R0609 Other forms of dyspnea: Secondary | ICD-10-CM

## 2012-09-14 DIAGNOSIS — I10 Essential (primary) hypertension: Secondary | ICD-10-CM

## 2012-09-14 DIAGNOSIS — R0989 Other specified symptoms and signs involving the circulatory and respiratory systems: Secondary | ICD-10-CM

## 2012-09-14 DIAGNOSIS — I2510 Atherosclerotic heart disease of native coronary artery without angina pectoris: Secondary | ICD-10-CM

## 2012-09-14 DIAGNOSIS — Z79899 Other long term (current) drug therapy: Secondary | ICD-10-CM

## 2012-09-14 DIAGNOSIS — R0600 Dyspnea, unspecified: Secondary | ICD-10-CM | POA: Insufficient documentation

## 2012-09-14 DIAGNOSIS — E7800 Pure hypercholesterolemia, unspecified: Secondary | ICD-10-CM

## 2012-09-14 LAB — HEPATIC FUNCTION PANEL
ALT: 22 U/L (ref 0–35)
AST: 28 U/L (ref 0–37)
Albumin: 4.1 g/dL (ref 3.5–5.2)
Alkaline Phosphatase: 56 U/L (ref 39–117)
Bilirubin, Direct: 0 mg/dL (ref 0.0–0.3)
Total Bilirubin: 1 mg/dL (ref 0.3–1.2)
Total Protein: 7.2 g/dL (ref 6.0–8.3)

## 2012-09-14 LAB — BASIC METABOLIC PANEL
BUN: 14 mg/dL (ref 6–23)
CO2: 29 mEq/L (ref 19–32)
Calcium: 9 mg/dL (ref 8.4–10.5)
Chloride: 102 mEq/L (ref 96–112)
Creatinine, Ser: 0.9 mg/dL (ref 0.4–1.2)
GFR: 66.34 mL/min (ref >60.00)
Glucose, Bld: 101 mg/dL — ABNORMAL HIGH (ref 70–99)
Potassium: 4.3 mEq/L (ref 3.5–5.1)
Sodium: 140 mEq/L (ref 135–145)

## 2012-09-14 MED ORDER — ALBUTEROL SULFATE HFA 108 (90 BASE) MCG/ACT IN AERS: 2.0000 | INHALATION_SPRAY | Freq: Four times a day (QID) | RESPIRATORY_TRACT | Status: DC | PRN

## 2012-09-14 NOTE — Assessment & Plan Note (Signed)
Well controlled.  Continue current medications and low sodium Dash type diet.    

## 2012-09-14 NOTE — Assessment & Plan Note (Signed)
Stable with no angina and good activity level.  Continue medical Rx  

## 2012-09-14 NOTE — Assessment & Plan Note (Signed)
Cholesterol is at goal.  Continue current dose of statin and diet Rx.  No myalgias or side effects.  F/U  LFT's in 6 months. No results found for this basename: LDLCALC             

## 2012-09-14 NOTE — Patient Instructions (Addendum)
Your physician wants you to follow-up in:   6 MONTHS   WITH DR Haywood Filler will receive a reminder letter in the mail two months in advance. If you don't receive a letter, please call our office to schedule the follow-up appointment. Your physician has recommended you make the following change in your medication:  ALBUTEROL  2 PUFFS EVERY 6 HOURS AS NEEDED A chest x-ray takes a picture of the organs and structures inside the chest, including the heart, lungs, and blood vessels. This test can show several things, including, whether the heart is enlarges; whether fluid is building up in the lungs; and whether pacemaker / defibrillator leads are still in place DX DYSPNEA Your physician has requested that you have an echocardiogram. Echocardiography is a painless test that uses sound waves to create images of your heart. It provides your doctor with information about the size and shape of your heart and how well your heart's chambers and valves are working. This procedure takes approximately one hour. There are no restrictions for this procedure. DX DYSPNEA  Your physician recommends that you return for lab work in: TODAY   BMET BNP  LIVER  DX V58.69  DYSPNEA

## 2012-09-14 NOTE — Assessment & Plan Note (Signed)
History of bronchospastic disease with wheezing.  Albuterol inhaler.  Check labs including BNP.  CXR  Today  Echo to make sure EF still normal F/U primary next week

## 2012-09-14 NOTE — Progress Notes (Signed)
Patient ID: Kim Lane, female   DOB: 22-Sep-1934, 78 y.o.   MRN: [id number] Kim Lane is seen today in followup for her coronary artery disease. She has stents to the LAD and diagonal placed by Dr. Juanda Chance in 2007. She had a nonischemic Myoview in 4/11 prior to orthopedic surgery . She will miss her Zocor and aspirin does on occasion. I explained to her the importance of taking both these medications in reg She is not having any PND or orthopnea there's been no claudication or syncope. She had RTKR by Dr Lequita Halt on 1/12 with no complications Still struggling with statins. Failed crestor and lipitor. Able to take zocor and red yeast with LDL of 105 on recent labs from [place]. Had normal myovue 6/12  Had sudden onset of dyspnea last night.  History of asthma but doesn't have inhaler.  Doing some remodeling and lots of dust around No chest pain, fever or cough  ROS: Denies fever, malais, weight loss, blurry vision, decreased visual acuity, cough, sputum, SOB, hemoptysis, pleuritic pain, palpitaitons, heartburn, abdominal pain, melena, lower extremity edema, claudication, or rash.  All other systems reviewed and negative  General: Affect appropriate Healthy:  appears stated age HEENT: normal Neck supple with no adenopathy JVP normal no bruits no thyromegaly Lungs clear with expitory wheezing and good diaphragmatic motion Heart:  S1/S2 no murmur, no rub, gallop or click PMI normal Abdomen: benighn, BS positve, no tenderness, no AAA no bruit.  No HSM or HJR Distal pulses intact with no bruits No edema Neuro non-focal Skin warm and dry No muscular weakness   Current Outpatient Prescriptions  Medication Sig Dispense Refill  . aspirin 81 MG tablet Take 81 mg by mouth daily.        [name] Kitchen glucosamine-chondroitin 500-400 MG tablet Take 1 tablet by mouth daily.        [name] Kitchen levothyroxine (SYNTHROID, LEVOTHROID) 112 MCG tablet Take 112 mcg by mouth daily.        . metoprolol succinate (TOPROL-XL) 25 MG 24 hr  tablet Take 1 tablet (25 mg total) by mouth daily.  30 tablet  6  . Multiple Vitamins-Minerals (CENTRUM SILVER PO) Take 1 tablet by mouth daily.        . nitroGLYCERIN (NITROLINGUAL) 0.4 MG/SPRAY spray Place 1 spray under the tongue every 5 (five) minutes as needed. Spray 1 spray as directed       . Omega-3 Fatty Acids (FISH OIL) 1000 MG CAPS Take 1 capsule by mouth daily.        . Red Yeast Rice Extract (RED YEAST RICE PO) Take by mouth daily.        . simvastatin (ZOCOR) 40 MG tablet 1 tab when pt remebers        Allergies  Review of patient's allergies indicates no known allergies.  Electrocardiogram:  NSR rate 72 normal ECG  Assessment and Plan

## 2012-09-19 ENCOUNTER — Other Ambulatory Visit: Payer: Self-pay

## 2012-09-19 ENCOUNTER — Ambulatory Visit (HOSPITAL_COMMUNITY): Payer: Medicare Other | Attending: Cardiology

## 2012-09-19 DIAGNOSIS — R0600 Dyspnea, unspecified: Secondary | ICD-10-CM

## 2012-09-19 DIAGNOSIS — R0989 Other specified symptoms and signs involving the circulatory and respiratory systems: Secondary | ICD-10-CM | POA: Insufficient documentation

## 2012-09-19 DIAGNOSIS — I059 Rheumatic mitral valve disease, unspecified: Secondary | ICD-10-CM | POA: Insufficient documentation

## 2012-09-19 DIAGNOSIS — R0609 Other forms of dyspnea: Secondary | ICD-10-CM | POA: Insufficient documentation

## 2012-09-19 DIAGNOSIS — R0602 Shortness of breath: Secondary | ICD-10-CM

## 2012-09-19 DIAGNOSIS — I369 Nonrheumatic tricuspid valve disorder, unspecified: Secondary | ICD-10-CM | POA: Insufficient documentation

## 2012-09-19 DIAGNOSIS — I2510 Atherosclerotic heart disease of native coronary artery without angina pectoris: Secondary | ICD-10-CM | POA: Insufficient documentation

## 2012-09-19 DIAGNOSIS — I10 Essential (primary) hypertension: Secondary | ICD-10-CM | POA: Insufficient documentation

## 2012-09-19 NOTE — Progress Notes (Signed)
Echocardiogram performed.  

## 2012-09-22 ENCOUNTER — Telehealth: Payer: Self-pay | Admitting: Cardiovascular Disease

## 2012-09-22 NOTE — Telephone Encounter (Signed)
plz return call to pt [phone] or cell regarding test results

## 2012-09-22 NOTE — Telephone Encounter (Signed)
PT AWARE OF ECHO RESULTS./CY 

## 2012-09-26 ENCOUNTER — Ambulatory Visit: Payer: Medicare Other

## 2012-10-03 ENCOUNTER — Ambulatory Visit: Payer: Self-pay

## 2012-10-04 ENCOUNTER — Ambulatory Visit
Admission: RE | Admit: 2012-10-04 | Discharge: 2012-10-04 | Disposition: A | Discharge status: Home / Self Care | Payer: Medicare Other | Source: Ambulatory Visit | Attending: Internal Medicine | Admitting: Internal Medicine

## 2012-10-04 DIAGNOSIS — Z1231 Encounter for screening mammogram for malignant neoplasm of breast: Secondary | ICD-10-CM

## 2012-10-12 NOTE — Addendum Note (Signed)
Addended by: Marrion Coy L on: 10/12/2012 03:15 PM   Modules accepted: Orders

## 2013-04-19 ENCOUNTER — Other Ambulatory Visit: Payer: Self-pay | Admitting: *Deleted

## 2013-04-19 MED ORDER — METOPROLOL SUCCINATE ER 25 MG PO TB24: 25.0000 mg | ORAL_TABLET | Freq: Every day | ORAL | Status: DC

## 2013-08-11 ENCOUNTER — Telehealth: Payer: Self-pay | Admitting: Cardiovascular Disease

## 2013-08-11 NOTE — Telephone Encounter (Signed)
New Problem   Chest pains last night..please contact pt to discuss//States she is well just a little concerned about the pain.

## 2013-08-11 NOTE — Telephone Encounter (Signed)
SPOKE WITH  PT  THIS AM PT CALLED WITH  HAVING AN EPISODE OF CHEST PAIN YESTERDAY WHILE COOKING  NTG WAS TAKEN WITH RELIEF  ALSO   PT HAD  EXPERIENCED SIMILAR EPISODE  APPROX  6-8 WEEKS AGO  WHILE DRIVING HAS  APPT WITH  DR Eden Emms  ON  09-21-13   OFFERED  AN EARLIER APPT WITH PA   PT DECLINED PER PT  IS GOING OUT OF TOWN  MON AND RETURNING THURS INSTRUCTED  IF  S/S INCREASE OR HAS TO TAKE  UP TO  3 NTG IN  15 MIN WITH NO RELIEF  TO  CALL 911 VERBALIZED UNDERSTANDING PER PT IS UNDER A LOT OF  STRESS AT THIS TIME  WILL CALL  IF NEEDS TO SEE  PA  AT AN EARLIER  APPT .Zack Seal

## 2013-08-23 ENCOUNTER — Ambulatory Visit: Payer: Medicare Other | Admitting: Cardiovascular Disease

## 2013-09-04 ENCOUNTER — Ambulatory Visit (INDEPENDENT_AMBULATORY_CARE_PROVIDER_SITE_OTHER): Payer: Medicare Other | Admitting: Cardiovascular Disease

## 2013-09-04 ENCOUNTER — Encounter: Payer: Self-pay | Admitting: Cardiovascular Disease

## 2013-09-04 VITALS — BP 124/76 | HR 72 | Ht 64.0 in | Wt 145.0 lb

## 2013-09-04 DIAGNOSIS — E7800 Pure hypercholesterolemia, unspecified: Secondary | ICD-10-CM

## 2013-09-04 DIAGNOSIS — I10 Essential (primary) hypertension: Secondary | ICD-10-CM

## 2013-09-04 DIAGNOSIS — I2510 Atherosclerotic heart disease of native coronary artery without angina pectoris: Secondary | ICD-10-CM

## 2013-09-04 DIAGNOSIS — R079 Chest pain, unspecified: Secondary | ICD-10-CM

## 2013-09-04 MED ORDER — NITROGLYCERIN 0.4 MG/SPRAY TL SOLN: 1.0000 | Status: DC | PRN

## 2013-09-04 NOTE — Assessment & Plan Note (Addendum)
Chest pain possible angina.  New nitro called in exercise myovue for this week Cath if abnormal

## 2013-09-04 NOTE — Progress Notes (Signed)
Patient ID: Kim Lane, female   DOB: 04-Feb-1934, 79 y.o.   MRN: [id number] Kim Lane is seen today in followup for her coronary artery disease. She has stents to the LAD and diagonal placed by Dr. Juanda Chance in 2007. She had a nonischemic Myoview in 4/11 prior to orthopedic surgery . She will miss her Zocor and aspirin does on occasion. I explained to her the importance of taking both these medications in reg She is not having any PND or orthopnea there's been no claudication or syncope. She had RTKR by Dr Lequita Halt on 1/12 with no complications Still struggling with statins. Failed crestor and lipitor. Able to take zocor and red yeast with LDL of 105 on recent labs from [place]. Had normal myovue 6/12  Has had 3 episodes of chest pain since July.  First episode while driving Lasted about 20 minutes and subsided before she needed to get nitro at home.  2nd episode August while at chiropracter.  Not classically exertional Discussed options and patient prefers myovue  She has a mission trip nect week and wants to go   ROS: Denies fever, malais, weight loss, blurry vision, decreased visual acuity, cough, sputum, SOB, hemoptysis, pleuritic pain, palpitaitons, heartburn, abdominal pain, melena, lower extremity edema, claudication, or rash.  All other systems reviewed and negative  General: Affect appropriate Healthy:  appears stated age HEENT: normal Neck supple with no adenopathy JVP normal no bruits no thyromegaly Lungs clear with no wheezing and good diaphragmatic motion Heart:  S1/S2 no murmur, no rub, gallop or click PMI normal Abdomen: benighn, BS positve, no tenderness, no AAA no bruit.  No HSM or HJR Distal pulses intact with no bruits No edema Neuro non-focal Skin warm and dry No muscular weakness   Current Outpatient Prescriptions  Medication Sig Dispense Refill  . albuterol (PROVENTIL HFA;VENTOLIN HFA) 108 (90 BASE) MCG/ACT inhaler Inhale 2 puffs into the lungs every 6 (six) hours as needed  for wheezing.  1 Inhaler  2  . aspirin 81 MG tablet Take 81 mg by mouth daily.        [name] Kitchen glucosamine-chondroitin 500-400 MG tablet Take 1 tablet by mouth daily.        [name] Kitchen levothyroxine (SYNTHROID, LEVOTHROID) 112 MCG tablet Take 112 mcg by mouth daily.        . metoprolol succinate (TOPROL-XL) 25 MG 24 hr tablet Take 1 tablet (25 mg total) by mouth daily.  30 tablet  6  . Multiple Vitamins-Minerals (CENTRUM SILVER PO) Take 1 tablet by mouth daily.        . nitroGLYCERIN (NITROLINGUAL) 0.4 MG/SPRAY spray Place 1 spray under the tongue every 5 (five) minutes as needed. Spray 1 spray as directed       . Omega-3 Fatty Acids (FISH OIL) 1000 MG CAPS Take 1 capsule by mouth daily.        . Red Yeast Rice Extract (RED YEAST RICE PO) Take by mouth daily.        . simvastatin (ZOCOR) 40 MG tablet 1 tab when pt remebers       No current facility-administered medications for this visit.    Allergies  Review of patient's allergies indicates no known allergies.  Electrocardiogram:  09/14/12  NSR rate 72 normal ECG   Today NSR no change from 2013  Normal   Assessment and Plan

## 2013-09-04 NOTE — Assessment & Plan Note (Signed)
Well controlled.  Continue current medications and low sodium Dash type diet.    

## 2013-09-04 NOTE — Patient Instructions (Signed)
Your physician recommends that you schedule a follow-up appointment in:   3 MONTHS   WITH  DR NISHAN Your physician recommends that you continue on your current medications as directed. Please refer to the Current Medication list given to you today.  Your physician has requested that you have en exercise stress myoview. For further information please visit [email or web address]. Please follow instruction sheet, as given.  

## 2013-09-04 NOTE — Assessment & Plan Note (Signed)
Cholesterol is at goal.  Continue current dose of statin and diet Rx.  No myalgias or side effects.  F/U  LFT's in 6 months. No results found for this basename: LDLCALC             

## 2013-09-04 NOTE — Addendum Note (Signed)
Addended by: Scherrie Bateman E on: 09/04/2013 10:34 AM   Modules accepted: Orders

## 2013-09-05 ENCOUNTER — Other Ambulatory Visit: Payer: Self-pay

## 2013-09-05 ENCOUNTER — Ambulatory Visit (HOSPITAL_COMMUNITY): Payer: Medicare Other | Attending: Cardiology | Admitting: Radiology

## 2013-09-05 VITALS — BP 120/63 | HR 61 | Ht 64.0 in | Wt 144.0 lb

## 2013-09-05 DIAGNOSIS — R0602 Shortness of breath: Secondary | ICD-10-CM | POA: Insufficient documentation

## 2013-09-05 DIAGNOSIS — R079 Chest pain, unspecified: Secondary | ICD-10-CM | POA: Insufficient documentation

## 2013-09-05 DIAGNOSIS — I2510 Atherosclerotic heart disease of native coronary artery without angina pectoris: Secondary | ICD-10-CM

## 2013-09-05 DIAGNOSIS — R000 Tachycardia, unspecified: Secondary | ICD-10-CM | POA: Insufficient documentation

## 2013-09-05 DIAGNOSIS — Z1231 Encounter for screening mammogram for malignant neoplasm of breast: Secondary | ICD-10-CM

## 2013-09-05 DIAGNOSIS — I10 Essential (primary) hypertension: Secondary | ICD-10-CM | POA: Insufficient documentation

## 2013-09-05 DIAGNOSIS — R5381 Other malaise: Secondary | ICD-10-CM | POA: Insufficient documentation

## 2013-09-05 MED ORDER — TECHNETIUM TC 99M SESTAMIBI GENERIC - CARDIOLITE
30.0000 | Freq: Once | INTRAVENOUS | Status: AC | PRN
  Administered 2013-09-05: 30 via INTRAVENOUS

## 2013-09-05 MED ORDER — TECHNETIUM TC 99M SESTAMIBI GENERIC - CARDIOLITE
10.0000 | Freq: Once | INTRAVENOUS | Status: AC | PRN
  Administered 2013-09-05: 10 via INTRAVENOUS

## 2013-09-05 NOTE — Progress Notes (Signed)
[facility] SITE 3 NUCLEAR MED [address] [place], [place] [address] [phone]    Cardiology Nuclear Med Study  Kim Lane is a 79 y.o. female     MRN : [id number]     DOB: May 16, 1934  Procedure Date: 09/05/2013  Nuclear Med Background Indication for Stress Test:  Evaluation for Ischemia and Stent Patency History:  '07 Stents, EF=0%; '12 XBM:WUXLKG, EF=78%; '13 Echo:EF=65% Cardiac Risk Factors: Hypertension and Lipids  Symptoms:  Chest Tightness (last episode of chest discomfort was about 3-weeks ago), Fatigue, Rapid HR and SOB   Nuclear Pre-Procedure Caffeine/Decaff Intake:  None NPO After: 7:00pm   Lungs:  Clear. O2 Sat: 98% on room air. IV 0.9% NS with Angio Cath:  22g  IV Site: R Hand  IV Started by:  Bonnita Levan, RN  Chest Size (in):  36 Cup Size: B  Height: 5\' 4"  (1.626 m)  Weight:  144 lb (65.318 kg)  BMI:  Body mass index is 24.71 kg/(m^2). Tech Comments:  Metoprolol held x 24 hours    Nuclear Med Study 1 or 2 day study: 1 day  Stress Test Type:  Stress  Reading MD: Cassell Clement, MD  Order Authorizing Provider:  Charlton Haws, MD  Resting Radionuclide: Technetium 13m Sestamibi  Resting Radionuclide Dose: 11.0 mCi   Stress Radionuclide:  Technetium 66m Sestamibi  Stress Radionuclide Dose: 33.0 mCi           Stress Protocol Rest HR: 61 Stress HR: 123  Rest BP: 120/63 Stress BP: 193/80  Exercise Time (min): 6:36 METS: 7.0   Predicted Max HR: 143 bpm % Max HR: 86.01 bpm Rate Pressure Product: 40102   Dose of Adenosine (mg):  n/a Dose of Lexiscan: n/a mg  Dose of Atropine (mg): n/a Dose of Dobutamine: n/a mcg/kg/min (at max HR)  Stress Test Technologist: Smiley Houseman, CMA-N  Nuclear Technologist:  Domenic Polite, CNMT     Rest Procedure:  Myocardial perfusion imaging was performed at rest 45 minutes following the intravenous administration of Technetium 46m Sestamibi.  Rest ECG: NSR - Normal EKG  Stress Procedure:  The patient  exercised on the treadmill utilizing the Bruce Protocol for 6:36 minutes. The patient stopped due to dyspnea and fatigue and denied.  She denied chest pain, occasional PAC's were noted.  Technetium 77m Sestamibi was injected at peak exercise and myocardial perfusion imaging was performed after a brief delay.  Stress ECG: No significant change from baseline ECG  QPS Raw Data Images:  Normal; no motion artifact; normal heart/lung ratio. Stress Images:  Normal homogeneous uptake in all areas of the myocardium. Rest Images:  Normal homogeneous uptake in all areas of the myocardium. Subtraction (SDS):  No evidence of ischemia. Transient Ischemic Dilatation (Normal <1.22):  NA Lung/Heart Ratio (Normal <0.45):  0.41  Quantitative Gated Spect Images QGS EDV:  52 ml QGS ESV:  10 ml  Impression Exercise Capacity:  Good exercise capacity. BP Response:  Normal blood pressure response. Clinical Symptoms:  No chest pain. ECG Impression:  No significant ST segment change suggestive of ischemia. Comparison with Prior Nuclear Study: No significant change from previous study  Overall Impression:  Normal stress nuclear study.  LV Ejection Fraction: 81%.  LV Wall Motion:  NL LV Function; NL Wall Motion  [employer]

## 2013-09-06 ENCOUNTER — Telehealth: Payer: Self-pay | Admitting: Cardiovascular Disease

## 2013-09-06 NOTE — Telephone Encounter (Signed)
New problem  Kim Lane,Please call Mrs.Okuda today looking for result from her test.

## 2013-09-06 NOTE — Telephone Encounter (Signed)
PT  NOTIFIED ./CY 

## 2013-09-21 ENCOUNTER — Ambulatory Visit: Payer: Medicare Other | Admitting: Cardiovascular Disease

## 2013-10-05 ENCOUNTER — Ambulatory Visit
Admission: RE | Admit: 2013-10-05 | Discharge: 2013-10-05 | Disposition: A | Discharge status: Home / Self Care | Payer: Medicare Other | Source: Ambulatory Visit

## 2013-10-05 DIAGNOSIS — Z1231 Encounter for screening mammogram for malignant neoplasm of breast: Secondary | ICD-10-CM

## 2013-12-04 ENCOUNTER — Ambulatory Visit (INDEPENDENT_AMBULATORY_CARE_PROVIDER_SITE_OTHER): Payer: Medicare Other | Admitting: Cardiovascular Disease

## 2013-12-04 ENCOUNTER — Encounter: Payer: Self-pay | Admitting: Cardiovascular Disease

## 2013-12-04 VITALS — BP 140/70 | HR 62 | Ht 63.0 in | Wt 146.0 lb

## 2013-12-04 DIAGNOSIS — I2510 Atherosclerotic heart disease of native coronary artery without angina pectoris: Secondary | ICD-10-CM

## 2013-12-04 DIAGNOSIS — E7800 Pure hypercholesterolemia, unspecified: Secondary | ICD-10-CM

## 2013-12-04 DIAGNOSIS — I10 Essential (primary) hypertension: Secondary | ICD-10-CM

## 2013-12-04 NOTE — Patient Instructions (Signed)
Your physician wants you to follow-up in: YEAR WITH DR NISHAN  You will receive a reminder letter in the mail two months in advance. If you don't receive a letter, please call our office to schedule the follow-up appointment.  Your physician recommends that you continue on your current medications as directed. Please refer to the Current Medication list given to you today. 

## 2013-12-04 NOTE — Progress Notes (Signed)
Patient ID: Kim Lane, female   DOB: [birth year], 79 y.o.   MRN: [id number] Kim Lane is seen today in followup for her coronary artery disease. She has stents to the LAD and diagonal placed by Dr. Juanda Chance in 2007. She had a nonischemic Myoview in 2012 prior to orthopedic surgery . She will miss her Zocor and aspirin does on occasion. I explained to her the importance of taking both these medications in reg She is not having any PND or orthopnea there's been no claudication or syncope. She had RTKR by Dr Lequita Halt on 1/12 with no complications Still struggling with statins. Failed crestor and lipitor. Able to take zocor and red yeast with LDL of 105 on recent labs from [place]. Has had 3 episodes of chest pain since July. First episode while driving Lasted about 20 minutes and subsided before she needed to get nitro at home. 2nd episode August while at chiropracter. Not classically exertional Discussed options and patient prefers myovue She has a mission trip nect week and wants to go   F/U Myovue 09/06/13 normal EF 80% with no ischemia  No complaints today  Compliant with meds     ROS: Denies fever, malais, weight loss, blurry vision, decreased visual acuity, cough, sputum, SOB, hemoptysis, pleuritic pain, palpitaitons, heartburn, abdominal pain, melena, lower extremity edema, claudication, or rash.  All other systems reviewed and negative  General: Affect appropriate Healthy:  appears stated age HEENT: normal Neck supple with no adenopathy JVP normal no bruits no thyromegaly Lungs clear with no wheezing and good diaphragmatic motion Heart:  S1/S2 no murmur, no rub, gallop or click PMI normal Abdomen: benighn, BS positve, no tenderness, no AAA no bruit.  No HSM or HJR Distal pulses intact with no bruits No edema Neuro non-focal Skin warm and dry No muscular weakness   Current Outpatient Prescriptions  Medication Sig Dispense Refill  . albuterol (PROVENTIL HFA;VENTOLIN HFA) 108 (90 BASE)  MCG/ACT inhaler Inhale 2 puffs into the lungs every 6 (six) hours as needed for wheezing.  1 Inhaler  2  . aspirin 81 MG tablet Take 81 mg by mouth daily.        [name] Kitchen glucosamine-chondroitin 500-400 MG tablet Take 1 tablet by mouth daily.        [name] Kitchen levothyroxine (SYNTHROID, LEVOTHROID) 112 MCG tablet Take 112 mcg by mouth daily.        . metoprolol succinate (TOPROL-XL) 25 MG 24 hr tablet Take 1 tablet (25 mg total) by mouth daily.  30 tablet  6  . Multiple Vitamins-Minerals (CENTRUM SILVER PO) Take 1 tablet by mouth daily.        . nitroGLYCERIN (NITROLINGUAL) 0.4 MG/SPRAY spray Place 1 spray under the tongue every 5 (five) minutes as needed. Spray 1 spray as directed  12 g  4  . Omega-3 Fatty Acids (FISH OIL) 1000 MG CAPS Take 1 capsule by mouth daily.        . Red Yeast Rice Extract (RED YEAST RICE PO) Take by mouth daily.        . simvastatin (ZOCOR) 40 MG tablet 1 tab when pt remebers       No current facility-administered medications for this visit.    Allergies  Review of patient's allergies indicates no known allergies.  Electrocardiogram:  SR rate 64 normal   Assessment and Plan

## 2013-12-04 NOTE — Assessment & Plan Note (Signed)
Well controlled.  Continue current medications and low sodium Dash type diet.    

## 2013-12-04 NOTE — Assessment & Plan Note (Signed)
Stable with no angina and good activity level.  Continue medical Rx  

## 2013-12-04 NOTE — Assessment & Plan Note (Signed)
Cholesterol is at goal.  Continue current dose of statin and diet Rx.  No myalgias or side effects.  F/U  LFT's in 6 months. No results found for this basename: LDLCALC  Labs with primary            

## 2014-05-17 ENCOUNTER — Other Ambulatory Visit: Payer: Self-pay | Admitting: *Deleted

## 2014-05-17 MED ORDER — METOPROLOL SUCCINATE ER 25 MG PO TB24: 25.0000 mg | ORAL_TABLET | Freq: Every day | ORAL | Status: DC

## 2014-08-06 ENCOUNTER — Ambulatory Visit (INDEPENDENT_AMBULATORY_CARE_PROVIDER_SITE_OTHER): Payer: Medicare Other

## 2014-08-06 ENCOUNTER — Ambulatory Visit (INDEPENDENT_AMBULATORY_CARE_PROVIDER_SITE_OTHER): Payer: Medicare Other | Admitting: Family Medicine

## 2014-08-06 VITALS — BP 122/74 | HR 64 | Temp 98.0°F | Resp 16 | Ht 62.5 in | Wt 145.2 lb

## 2014-08-06 DIAGNOSIS — M79609 Pain in unspecified limb: Secondary | ICD-10-CM

## 2014-08-06 DIAGNOSIS — M25539 Pain in unspecified wrist: Secondary | ICD-10-CM

## 2014-08-06 DIAGNOSIS — S63501A Unspecified sprain of right wrist, initial encounter: Secondary | ICD-10-CM

## 2014-08-06 DIAGNOSIS — W19XXXA Unspecified fall, initial encounter: Secondary | ICD-10-CM

## 2014-08-06 DIAGNOSIS — M79605 Pain in left leg: Secondary | ICD-10-CM

## 2014-08-06 DIAGNOSIS — M25531 Pain in right wrist: Secondary | ICD-10-CM

## 2014-08-06 DIAGNOSIS — S63509A Unspecified sprain of unspecified wrist, initial encounter: Secondary | ICD-10-CM

## 2014-08-06 NOTE — Progress Notes (Signed)
Is a 80 year old woman who fell yesterday when she was looking up and not watching her stat. She landed with an outstretched hand on the right. She skinned her left elbow, her right knee.  Patient has had a total knee replacement on the left and is having some discomfort there although it has gotten a little better the last 24 hours. Her main concern is her right wrist, with pain over the dorsal radius near the joint line.  Objective: Patient has an abrasion of his left elbow but she has full range of motion.  She has full range of motion of the right wrist but she is tender over the distal radius. There is no significant ecchymosis or effusion her. She does have some mild swelling in the general area of her right wrist  Patient has a bandage on her left elbow which is overlying and abrasion. This measures about 3 x 2 cm.  Patient has a very superficial abrasion over the lateral aspect of her left knee. She has full range of motion and no effusion. There is no ecchymosis there were obvious joint deformity.  [facility] reading (PRIMARY) by  Dr. Joseph Art:  Right wrist looks negative in all but one view where there may be a cortical defect in the mid joint line of the distal radius.  Left knee replacement looks fine.  Wrist pain, acute, right - Plan: DG Wrist Complete Right  Fall, initial encounter - Plan: DG Knee 1-2 Views Left  Pain of left lower extremity - Plan: DG Knee 1-2 Views Left  Wrist sprain, right, initial encounter Wrist splint x 1 week.  General wound care.   Signed, Robyn Haber, MD

## 2014-09-11 ENCOUNTER — Other Ambulatory Visit: Payer: Self-pay

## 2014-09-11 DIAGNOSIS — Z1231 Encounter for screening mammogram for malignant neoplasm of breast: Secondary | ICD-10-CM

## 2014-10-15 ENCOUNTER — Ambulatory Visit
Admission: RE | Admit: 2014-10-15 | Discharge: 2014-10-15 | Disposition: A | Discharge status: Home / Self Care | Payer: Medicare Other | Source: Ambulatory Visit

## 2014-10-15 DIAGNOSIS — Z1231 Encounter for screening mammogram for malignant neoplasm of breast: Secondary | ICD-10-CM

## 2014-11-22 ENCOUNTER — Encounter: Payer: Self-pay | Admitting: Cardiovascular Disease

## 2014-11-22 ENCOUNTER — Ambulatory Visit (INDEPENDENT_AMBULATORY_CARE_PROVIDER_SITE_OTHER): Payer: Medicare Other | Admitting: Cardiovascular Disease

## 2014-11-22 VITALS — BP 100/78 | HR 63 | Ht 62.5 in | Wt 144.8 lb

## 2014-11-22 DIAGNOSIS — R0600 Dyspnea, unspecified: Secondary | ICD-10-CM

## 2014-11-22 DIAGNOSIS — I2510 Atherosclerotic heart disease of native coronary artery without angina pectoris: Secondary | ICD-10-CM

## 2014-11-22 DIAGNOSIS — E7800 Pure hypercholesterolemia, unspecified: Secondary | ICD-10-CM

## 2014-11-22 DIAGNOSIS — I10 Essential (primary) hypertension: Secondary | ICD-10-CM

## 2014-11-22 NOTE — Assessment & Plan Note (Signed)
Cholesterol is at goal.  Continue current dose of statin and diet Rx.  No myalgias or side effects.  F/U  LFT's in 6 months. No results found for: [facility] Labs with [facility] primary she reports LDL at goal

## 2014-11-22 NOTE — Assessment & Plan Note (Signed)
Stable with no angina and good activity level.  Continue medical Rx  

## 2014-11-22 NOTE — Patient Instructions (Signed)
Your physician wants you to follow-up in: YEAR WITH DR NISHAN  You will receive a reminder letter in the mail two months in advance. If you don't receive a letter, please call our office to schedule the follow-up appointment.  Your physician recommends that you continue on your current medications as directed. Please refer to the Current Medication list given to you today. 

## 2014-11-22 NOTE — Progress Notes (Signed)
Patient ID: Kim Lane, female   DOB: 03/19/1934, 80 y.o.   MRN: [id number] Sherran is seen today in followup for her coronary artery disease. She has stents to the LAD and diagonal placed by Dr. Olevia Perches in 2007. She had a nonischemic Myoview in 2012 prior to orthopedic surgery . She will miss her Zocor and aspirin does on occasion. I explained to her the importance of taking both these medications in reg She is not having any PND or orthopnea there's been no claudication or syncope. She had RTKR by Dr Wynelle Link on 3/01 with no complications Still struggling with statins. Failed crestor and lipitor. Able to take zocor and red yeast with LDL of 105 on recent labs from [place]. Has had 3 episodes of chest pain since July. First episode while driving Lasted about 20 minutes and subsided before she needed to get nitro at home. 2nd episode August while at chiropracter. 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  All other systems reviewed and negative  General: Affect appropriate Healthy:  appears stated age 19: normal Neck supple with no adenopathy JVP normal no bruits no thyromegaly Lungs clear with no wheezing and good diaphragmatic motion Heart:  S1/S2 no murmur, no rub, gallop or click PMI normal Abdomen: benighn, BS positve, no tenderness, no AAA no bruit.  No HSM or HJR Distal pulses intact with no bruits No edema Neuro non-focal Skin warm and dry S/P bilateral knee replacements    Current Outpatient Prescriptions   Medication Sig Dispense Refill  . albuterol (PROVENTIL HFA;VENTOLIN HFA) 108 (90 BASE) MCG/ACT inhaler Inhale 2 puffs into the lungs every 6 (six) hours as needed for wheezing. 1 Inhaler 2  . aspirin 81 MG tablet Take 81 mg by mouth daily.      [name] Kitchen glucosamine-chondroitin 500-400 MG tablet Take 1 tablet by mouth daily.      [name] Kitchen levothyroxine (SYNTHROID, LEVOTHROID) 112 MCG tablet Take 112 mcg by mouth daily.      . Multiple Vitamins-Minerals (CENTRUM SILVER PO) Take 1 tablet by mouth daily.      . nitroGLYCERIN (NITROLINGUAL) 0.4 MG/SPRAY spray Place 1 spray under the tongue every 5 (five) minutes as needed. Spray 1 spray as directed (Patient not taking: Reported on 11/22/2014) 12 g 4  . Omega-3 Fatty Acids (FISH OIL) 1000 MG CAPS Take 1 capsule by mouth daily.      . Red Yeast Rice Extract (RED YEAST RICE PO) Take by mouth daily.      . simvastatin (ZOCOR) 40 MG tablet 1 tab when pt remebers    . co-enzyme Q-10 30 MG capsule Take by mouth daily.    . Glucosamine 750 MG TABS Take by mouth daily.    . metoprolol succinate (TOPROL-XL) 25 MG 24 hr tablet Take 1 tablet (25 mg total) by mouth daily. 30 tablet 5   No current facility-administered medications for this visit.    Allergies  Review of patient's allergies indicates no known allergies.  Electrocardiogram:  SR rate 63 normal ECG no change from 2014   Assessment and  Plan

## 2014-11-22 NOTE — Assessment & Plan Note (Signed)
Well controlled.  Continue current medications and low sodium Dash type diet.    

## 2015-06-14 ENCOUNTER — Other Ambulatory Visit: Payer: Self-pay | Admitting: *Deleted

## 2015-06-14 MED ORDER — METOPROLOL SUCCINATE ER 25 MG PO TB24: 25.0000 mg | ORAL_TABLET | Freq: Every day | ORAL | Status: DC

## 2015-07-14 NOTE — Progress Notes (Signed)
Patient ID: Kim Lane, female   DOB: August 15, 1934, 81 y.o.   MRN: [id number] Kim Lane is seen today in followup for her coronary artery disease. She has stents to the LAD and diagonal placed by Dr. Olevia Perches in 2007. She had a nonischemic Myoview in 2012 prior to orthopedic surgery . She will miss her Zocor and aspirin does on occasion. I explained to her the importance of taking both these medications in reg She is not having any PND or orthopnea there's been no claudication or syncope. She had RTKR by Dr Wynelle Link on 0/97 with no complications Still struggling with statins. Failed crestor and lipitor. Able to take zocor and red yeast with LDL of 105 on recent labs from [place].  F/U Myovue 09/06/13 normal EF 80% with no ischemia  No complaints today Compliant with meds  Retired  from [employer] some [place] BP readings reviewed and normal  Having a couple of falls which seem to be mechanical and from non cardiac dizzyness  ROS: Denies fever, malais, weight loss, blurry vision, decreased visual acuity, cough, sputum, SOB, hemoptysis, pleuritic pain, palpitaitons, heartburn, abdominal pain, melena, lower extremity edema, claudication, or rash.  All other systems reviewed and negative  General: Affect appropriate Healthy:  appears stated age 24: normal Neck supple with no adenopathy JVP normal no bruits no thyromegaly Lungs clear with no wheezing and good diaphragmatic motion Heart:  S1/S2 no murmur, no rub, gallop or click PMI normal Abdomen: benighn, BS positve, no tenderness, no AAA no bruit.  No HSM or HJR Distal pulses intact with no bruits No edema varicose veings  Neuro non-focal Skin warm and dry S/P bilateral knee replacements    Current Outpatient Prescriptions  Medication Sig Dispense Refill  . albuterol (PROVENTIL HFA;VENTOLIN HFA) 108 (90 BASE) MCG/ACT inhaler Inhale 2 puffs into the lungs every 6 (six) hours as needed for wheezing. 1 Inhaler 2   . aspirin 81 MG tablet Take 81 mg by mouth daily.      [name] Kitchen co-enzyme Q-10 30 MG capsule Take by mouth daily.    [name] Kitchen glucosamine-chondroitin 500-400 MG tablet Take 1 tablet by mouth daily.      [name] Kitchen levothyroxine (SYNTHROID, LEVOTHROID) 112 MCG tablet Take 112 mcg by mouth daily.      . metoprolol succinate (TOPROL-XL) 25 MG 24 hr tablet Take 12.5 mg by mouth daily.    . Multiple Vitamins-Minerals (CENTRUM SILVER PO) Take 1 tablet by mouth daily.      . nitroGLYCERIN (NITROLINGUAL) 0.4 MG/SPRAY spray Place 1 spray under the tongue every 5 (five) minutes as needed. Spray 1 spray as directed 12 g 4  . Omega-3 Fatty Acids (FISH OIL) 1000 MG CAPS Take 1 capsule by mouth daily.      . Red Yeast Rice Extract (RED YEAST RICE PO) Take by mouth daily.      . simvastatin (ZOCOR) 40 MG tablet 1 tab when pt remebers     No current facility-administered medications for this visit.    Allergies  Review of patient's allergies indicates no known allergies.  Electrocardiogram:  12/3/ 2015 SR rate 63 normal ECG no change from 2014   Assessment and Plan Dizzyness:  Non cardiac not postural BP and HR ok  F/u primary CAD:  Stable with no angina and good activity level.  Continue medical Rx ChoL  Intolerant to statins due to muscle pain.  Not taking zocor  Occasionally takes red yeast rice Refer to lipid clinic for  possible praluent Thyroid  Continue replacement TSH normal

## 2015-07-15 ENCOUNTER — Ambulatory Visit (INDEPENDENT_AMBULATORY_CARE_PROVIDER_SITE_OTHER): Payer: Medicare Other | Admitting: Cardiovascular Disease

## 2015-07-15 ENCOUNTER — Encounter: Payer: Self-pay | Admitting: Cardiovascular Disease

## 2015-07-15 VITALS — BP 121/60 | HR 62 | Ht 63.0 in | Wt 143.1 lb

## 2015-07-15 DIAGNOSIS — I2583 Coronary atherosclerosis due to lipid rich plaque: Principal | ICD-10-CM

## 2015-07-15 DIAGNOSIS — I2510 Atherosclerotic heart disease of native coronary artery without angina pectoris: Secondary | ICD-10-CM

## 2015-07-15 NOTE — Patient Instructions (Signed)
Medication Instructions:  NO CHANGES  Labwork: NONE  Testing/Procedures: NONE  Follow-Up: Your physician wants you to follow-up in: [place]  SEE SALLY IN LIPID  CLINIC FOR  POSSIBLE   PRALUENT   THERAPY  You will receive a reminder letter in the mail two months in advance. If you don't receive a letter, please call our office to schedule the follow-up appointment.  Any Other Special Instructions Will Be Listed Below (If Applicable).

## 2015-07-19 ENCOUNTER — Ambulatory Visit: Payer: Medicare Other | Admitting: Pharmacist

## 2015-09-18 ENCOUNTER — Other Ambulatory Visit: Payer: Self-pay

## 2015-09-18 DIAGNOSIS — Z1231 Encounter for screening mammogram for malignant neoplasm of breast: Secondary | ICD-10-CM

## 2015-10-17 ENCOUNTER — Ambulatory Visit
Admission: RE | Admit: 2015-10-17 | Discharge: 2015-10-17 | Disposition: A | Discharge status: Home / Self Care | Payer: Medicare Other | Source: Ambulatory Visit

## 2015-10-17 DIAGNOSIS — Z1231 Encounter for screening mammogram for malignant neoplasm of breast: Secondary | ICD-10-CM

## 2015-11-21 ENCOUNTER — Encounter: Payer: Self-pay | Admitting: Cardiovascular Disease

## 2015-11-21 NOTE — Progress Notes (Signed)
Patient ID: Kim Lane, female   DOB: 1934-07-06, 81 y.o.   MRN: [id number]   Kim Lane is seen today in followup for her coronary artery disease. She has stents to the LAD and diagonal placed by Dr. Olevia Lane in 2007. She had a nonischemic Myoview in 2012 prior to orthopedic surgery . She will miss her Zocor and aspirin does on occasion. I explained to her the importance of taking both these medications in reg She is not having any PND or orthopnea there's been no claudication or syncope. She had RTKR by Dr Kim Lane on 0000000 with no complications Still struggling with statins. Failed crestor and lipitor. Able to take zocor and red yeast with LDL of 105 on recent labs from [place].  F/U Myovue 09/06/13 normal EF 80% with no ischemia  No complaints today Compliant with meds  Retired  from [employer] some [place] BP readings reviewed and normal  Having a couple of falls which seem to be mechanical and from non cardiac dizzyness  Kim Lane a few months ago Dr Kim Lane thought she needed rotator cuff surgery but she has had improvement with chiropractor   ROS: Denies fever, malais, weight loss, blurry vision, decreased visual acuity, cough, sputum, SOB, hemoptysis, pleuritic pain, palpitaitons, heartburn, abdominal pain, melena, lower extremity edema, claudication, or rash.  All other systems reviewed and negative  General: Affect appropriate Healthy:  appears stated age 81: normal Neck supple with no adenopathy JVP normal no bruits no thyromegaly Lungs clear with no wheezing and good diaphragmatic motion Heart:  S1/S2 1/6 SEM RUSB murmur, no rub, gallop or click PMI normal Abdomen: benighn, BS positve, no tenderness, no AAA no bruit.  No HSM or HJR Distal pulses intact with no bruits No edema varicose veings  Neuro non-focal Skin warm and dry S/P bilateral knee replacements    Current Outpatient Prescriptions  Medication Sig Dispense Refill  . aspirin 81 MG  tablet Take 81 mg by mouth daily.      [name] Kitchen co-enzyme Q-10 30 MG capsule Take 30 mg by mouth daily.     [name] Kitchen glucosamine-chondroitin 500-400 MG tablet Take 1 tablet by mouth daily as needed (for arthritis).     [name] Kitchen levothyroxine (SYNTHROID, LEVOTHROID) 88 MCG tablet Take 88 mcg by mouth daily.    . metoprolol succinate (TOPROL-XL) 25 MG 24 hr tablet Take 12.5 mg by mouth every other day.     . Multiple Vitamins-Minerals (CENTRUM SILVER PO) Take 1 tablet by mouth daily.      . nitroGLYCERIN (NITROLINGUAL) 0.4 MG/SPRAY spray Place 1 spray under the tongue every 5 (five) minutes x 3 doses as needed for chest pain (3 sprays max).    . Omega-3 Fatty Acids (FISH OIL) 1000 MG CAPS Take 1 capsule by mouth every other day.     . Red Yeast Rice Extract (RED YEAST RICE PO) Take 1 tablet by mouth every other day.     . RESTASIS 0.05 % ophthalmic emulsion Place 2 drops into both eyes daily as needed. For dry eyes     No current facility-administered medications for this visit.    Allergies  Review of patient's allergies indicates no known allergies.  Electrocardiogram:  12/3/ 2015 SR rate 63 normal ECG no change from 2014   Assessment and Plan Dizzyness:  Non cardiac not postural BP and HR ok  F/u primary CAD:  Stent to LAD/Diagonal 2007  Normal myovue 2014  Stable with no angina and good activity level.  Continue medical Rx ChoL  Intolerant to statins due to muscle pain.  Not taking zocor  Occasionally takes red yeast rice Refer to lipid clinic for possible praluent Thyroid  Continue replacement TSH normal Shoulder:  Would need myovue to clear for surgery improved with PT and chiroprator  Kim Lane

## 2015-11-22 ENCOUNTER — Ambulatory Visit (INDEPENDENT_AMBULATORY_CARE_PROVIDER_SITE_OTHER): Payer: Medicare Other | Admitting: Cardiovascular Disease

## 2015-11-22 ENCOUNTER — Encounter: Payer: Self-pay | Admitting: Cardiovascular Disease

## 2015-11-22 VITALS — BP 110/56 | HR 76 | Ht 64.0 in | Wt 143.0 lb

## 2015-11-22 DIAGNOSIS — I10 Essential (primary) hypertension: Secondary | ICD-10-CM | POA: Diagnosis not present

## 2015-11-22 DIAGNOSIS — I2510 Atherosclerotic heart disease of native coronary artery without angina pectoris: Secondary | ICD-10-CM

## 2015-11-22 DIAGNOSIS — I2583 Coronary atherosclerosis due to lipid rich plaque: Secondary | ICD-10-CM

## 2015-11-22 NOTE — Patient Instructions (Signed)

## 2016-03-21 DIAGNOSIS — I639 Cerebral infarction, unspecified: Secondary | ICD-10-CM

## 2016-03-21 HISTORY — DX: Cerebral infarction, unspecified: I63.9

## 2016-04-10 LAB — TSH: TSH: 3.73 (ref 0.41–5.90)

## 2016-04-10 LAB — BASIC METABOLIC PANEL
BUN: 17 (ref 4–21)
Creatinine: 1.1 (ref 0.5–1.1)
Glucose: 98
Potassium: 4.9 (ref 3.4–5.3)
Sodium: 138 (ref 137–147)

## 2016-04-10 LAB — HEPATIC FUNCTION PANEL
ALT: 16 (ref 7–35)
AST: 22 (ref 13–35)
Alkaline Phosphatase: 55 (ref 25–125)
Bilirubin, Total: 0.7

## 2016-04-10 LAB — CBC AND DIFFERENTIAL
HCT: 41 (ref 36–46)
Hemoglobin: 13.4 (ref 12.0–16.0)
Platelets: 397 (ref 150–399)
WBC: 9.2

## 2016-04-13 ENCOUNTER — Other Ambulatory Visit: Payer: Self-pay | Admitting: Internal Medicine

## 2016-04-13 ENCOUNTER — Ambulatory Visit
Admission: RE | Admit: 2016-04-13 | Discharge: 2016-04-13 | Disposition: A | Discharge status: Home / Self Care | Payer: Medicare Other | Source: Ambulatory Visit | Attending: Internal Medicine | Admitting: Internal Medicine

## 2016-04-13 DIAGNOSIS — R030 Elevated blood-pressure reading, without diagnosis of hypertension: Secondary | ICD-10-CM

## 2016-04-13 DIAGNOSIS — IMO0001 Reserved for inherently not codable concepts without codable children: Secondary | ICD-10-CM

## 2016-04-13 DIAGNOSIS — R4789 Other speech disturbances: Secondary | ICD-10-CM

## 2016-04-13 DIAGNOSIS — R531 Weakness: Secondary | ICD-10-CM

## 2016-04-13 DIAGNOSIS — R262 Difficulty in walking, not elsewhere classified: Secondary | ICD-10-CM

## 2016-04-16 ENCOUNTER — Telehealth: Payer: Self-pay | Admitting: Diagnostic Neuroimaging

## 2016-04-16 NOTE — Telephone Encounter (Signed)
Kim Lane [phone] called to request New Patient appointment 04/20/16 be moved up to today or tomorrow. [name] spoke with Dr. [facility] [place] and was advised to call back and move appointment up sooner. States [name] did labwork last Friday and a CT scan on Monday of this week that showed a spot on brain, possible stroke. Per Diane/ New Patient Coord., Doctor at [facility] will need to call our office and speak with work in Tax adviser. Patient placed on cancellation list. [name] will call [facility] Physician.

## 2016-04-20 ENCOUNTER — Encounter: Payer: Self-pay | Admitting: Diagnostic Neuroimaging

## 2016-04-20 ENCOUNTER — Ambulatory Visit (INDEPENDENT_AMBULATORY_CARE_PROVIDER_SITE_OTHER): Payer: Medicare Other | Admitting: Diagnostic Neuroimaging

## 2016-04-20 VITALS — BP 103/62 | HR 72 | Ht 64.0 in | Wt 140.0 lb

## 2016-04-20 DIAGNOSIS — E7800 Pure hypercholesterolemia, unspecified: Secondary | ICD-10-CM

## 2016-04-20 DIAGNOSIS — I6330 Cerebral infarction due to thrombosis of unspecified cerebral artery: Secondary | ICD-10-CM | POA: Diagnosis not present

## 2016-04-20 DIAGNOSIS — G609 Hereditary and idiopathic neuropathy, unspecified: Secondary | ICD-10-CM

## 2016-04-20 DIAGNOSIS — I10 Essential (primary) hypertension: Secondary | ICD-10-CM

## 2016-04-20 DIAGNOSIS — R269 Unspecified abnormalities of gait and mobility: Secondary | ICD-10-CM | POA: Diagnosis not present

## 2016-04-20 DIAGNOSIS — R4781 Slurred speech: Secondary | ICD-10-CM

## 2016-04-20 NOTE — Patient Instructions (Addendum)
Thank you for coming to see [place] at [facility]. I hope we have been able to provide you high quality care today.  You may receive a patient satisfaction survey over the next few weeks. We would appreciate your feedback and comments so that we may continue to improve ourselves and the health of our patients.  - I will check MRI, MRA scans - I will check echocardiogram - I will check labs - continue aspirin '81mg'$  daily - continue BP medication and statin - follow up with cardiology and PCP  - if you have new or sudden change in neurologic symptoms (numbness, weakness, speech difficulty) call 911 and go to ER to get evaluation   ~~~~~~~~~~~~~~~~~~~~~~~~~~~~~~~~~~~~~~~~~~~~~~~~~~~~~~~~~~~~~~~~~  DR. Chantella Creech'S GUIDE TO HAPPY AND HEALTHY LIVING These are some of my general health and wellness recommendations. Some of them may apply to you better than others. Please use common sense as you try these suggestions and feel free to ask me any questions.   ACTIVITY/FITNESS Mental, social, emotional and physical stimulation are very important for brain and body health. Try learning a new activity (arts, music, language, sports, games).  Keep moving your body to the best of your abilities. You can do this at home, inside or outside, the park, community center, gym or anywhere you like. Consider a physical therapist or personal trainer to get started. Consider the app Sworkit. Fitness trackers such as smart-watches, smart-phones or Fitbits can help as well.   NUTRITION Eat more plants: colorful vegetables, nuts, seeds and berries.  Eat less sugar, salt, preservatives and processed foods.  Avoid toxins such as cigarettes and alcohol.  Drink water when you are thirsty. Warm water with a slice of lemon is an excellent morning drink to start the day.  Consider these websites for more information The Nutrition Source (https://www.henry-hernandez.biz/) Precision  Nutrition (WindowBlog.ch)   RELAXATION Consider practicing mindfulness meditation or other relaxation techniques such as deep breathing, prayer, yoga, tai chi, massage. See website mindful.org or the apps Headspace or Calm to help get started.   SLEEP Try to get at least 7-8+ hours sleep per day. Regular exercise and reduced caffeine will help you sleep better. Practice good sleep hygeine techniques. See website sleep.org for more information.   PLANNING Prepare estate planning, living will, healthcare POA documents. Sometimes this is best planned with the help of an attorney. Theconversationproject.org and agingwithdignity.org are excellent resources.

## 2016-04-20 NOTE — Progress Notes (Signed)
[facility]  PATIENT: AIANNA MERKLINGER DOB: 1934/01/06  REFERRING CLINICIAN: Shamleffer HISTORY FROM: patient  REASON FOR VISIT: new consult    HISTORICAL  CHIEF COMPLAINT:  Chief Complaint  Patient presents with  . Abnormal CT head    rm 7, New Pt, dgtr- Rhonda, " slurred speech, problem walking- wobbly, L leg draggy/turned in a little, one side of mouth not moving, tongue thick - since Easter"    HISTORY OF PRESENT ILLNESS:   82 year old right-handed female here for evaluation of slurred speech and trouble talking. 04/06/16 patient had his new onset slurred speech and balance difficulty. 04/10/16 she had significant balance difficulty and elevated blood pressure of 180/120. Patient has been having trouble with word recall. Patient also having some trouble with swallowing as well as bilateral leg weakness. Patient feeling some additional problems in her left face and left foot. Patient had not been taking aspirin on a regular basis prior to this event. Patient went to PCP for evaluation and then referred to me for neurology consultation.   REVIEW OF SYSTEMS: Full 14 system review of systems performed and negative with exception of: Weakness slurred speech insomnia restless legs depression anxiety feeling cold joint pain aching muscles cost patient coughed fatigue rash itching.  ALLERGIES: Allergies  Allergen Reactions  . Fosamax [Alendronate Sodium] Other (See Comments)    "made me ache all over"    HOME MEDICATIONS: Outpatient Prescriptions Prior to Visit  Medication Sig Dispense Refill  . aspirin 81 MG tablet Take 81 mg by mouth daily.      [name] Kitchen co-enzyme Q-10 30 MG capsule Take 30 mg by mouth daily.     [name] Kitchen glucosamine-chondroitin 500-400 MG tablet Take 1 tablet by mouth daily as needed (for arthritis).     [name] Kitchen levothyroxine (SYNTHROID, LEVOTHROID) 88 MCG tablet Take 88 mcg by mouth daily.    . metoprolol succinate (TOPROL-XL) 25 MG 24 hr tablet Take 12.5 mg by  mouth every other day.     . Multiple Vitamins-Minerals (CENTRUM SILVER PO) Take 1 tablet by mouth daily.      . nitroGLYCERIN (NITROLINGUAL) 0.4 MG/SPRAY spray Place 1 spray under the tongue every 5 (five) minutes x 3 doses as needed for chest pain (3 sprays max).    . Omega-3 Fatty Acids (FISH OIL) 1000 MG CAPS Take 1 capsule by mouth every other day.     . Red Yeast Rice Extract (RED YEAST RICE PO) Take 1 tablet by mouth every other day.     . RESTASIS 0.05 % ophthalmic emulsion Place 2 drops into both eyes daily as needed. For dry eyes     No facility-administered medications prior to visit.    PAST MEDICAL HISTORY: Past Medical History  Diagnosis Date  . CAD (coronary artery disease) 02/2006    Taxus stent placed to LAD and Diagonal per Dr. Olevia Perches  . Hypercholesterolemia   . HTN (hypertension)   . Hypothyroidism   . IBS (irritable bowel syndrome)   . Osteoarthritis     PAST SURGICAL HISTORY: Past Surgical History  Procedure Laterality Date  . Appendectomy    . Vaginal hysterectomy    . Stents      in heart  . Total knee arthroplasty Bilateral 2011,2012    FAMILY HISTORY: Family History  Problem Relation Age of Onset  . Diabetes Mother   . Dementia Mother   . ALS Mother   . Heart Problems Father   . Liver disease Sister   .  Cirrhosis Sister   . Heart block      CABG  . Diabetes Sister   . Heart block Son     CABG  . Other [place]  . Other Daughter     [place]  . Dementia Sister     SOCIAL HISTORY:  Social History   Social History  . Marital Status: Widowed    Spouse Name: N/A  . Number of Children: 2  . Years of Education: 12   Occupational History  .  [employer]   Social History Main Topics  . Smoking status: Former Research scientist (life sciences)  . Smokeless tobacco: Not on file     Comment: quit smoking 30 years ago, very light smoker  . Alcohol Use: No  . Drug Use: No  . Sexual Activity: Not on file   Other  Topics Concern  . Not on file   Social History Narrative   Lives alone   caffeine use- coffee- 5 cups daily     PHYSICAL EXAM  GENERAL EXAM/CONSTITUTIONAL: Vitals:  Filed Vitals:   04/20/16 0919  BP: 103/62  Pulse: 72  Height: 5\' 4"  (1.626 m)  Weight: 140 lb (63.504 kg)     Body mass index is 24.02 kg/(m^2).  Visual Acuity Screening   Right eye Left eye Both eyes  Without correction:     With correction: 20/30 20/20      Patient is in no distress; well developed, nourished and groomed; neck is supple  CARDIOVASCULAR:  Examination of carotid arteries is normal; no carotid bruits  Regular rate and rhythm, no murmurs  Examination of peripheral vascular system by observation and palpation is normal  RADIAL PULSES SYMM; NO SUBCLAVIAN BRUITS  EYES:  Ophthalmoscopic exam of optic discs and posterior segments is normal; no papilledema or hemorrhages  MUSCULOSKELETAL:  Gait, strength, tone, movements noted in Neurologic exam below  NEUROLOGIC: MENTAL STATUS:  No flowsheet data found.  awake, alert, oriented to person, place and time  recent and remote memory intact  normal attention and concentration  language fluent, comprehension intact, naming intact,   fund of knowledge appropriate  CRANIAL NERVE:   2nd - no papilledema on fundoscopic exam  2nd, 3rd, 4th, 6th - pupils equal and reactive to light, visual fields full to confrontation, extraocular muscles intact, no nystagmus  5th - facial sensation symmetric  7th - facial strength --> LEFT CORNER OF MOUTH SLIGHTLY LOWER THAN RIGHT  8th - hearing intact  9th - palate elevates symmetrically, uvula midline  11th - shoulder shrug symmetric  12th - tongue protrusion midline  MILD DYSARTHRIA  MOTOR:   normal bulk and tone, full strength in the BUE, BLE  SENSORY:   normal and symmetric to light touch, pinprick, temperature, vibration; EXCEPT DECR PP, TEMP AND VIB AT FEET  COORDINATION:    finger-nose-finger, fine finger movements normal  REFLEXES:   deep tendon reflexes --> BUE 2, KNEES 1, ANKLES 0  GAIT/STATION:   narrow based gait; SLIGHT LIMP ON LEFT LEG; SLIGHT DIFF WITH HEEL GAIT; STANDING TOGETHER EYES OPEN PATIENT IS UNSTEADY    DIAGNOSTIC DATA (LABS, IMAGING, TESTING) - I reviewed patient records, labs, notes, testing and imaging myself where available.  Lab Results  Component Value Date   WBC 7.2 05/30/2011   HGB 12.3 05/30/2011   HCT 36.6 05/30/2011   MCV 84.5 05/30/2011   PLT 383 05/30/2011      Component Value Date/Time  NA 140 09/14/2012 1205   K 4.3 09/14/2012 1205   CL 102 09/14/2012 1205   CO2 29 09/14/2012 1205   GLUCOSE 101* 09/14/2012 1205   BUN 14 09/14/2012 1205   CREATININE 0.9 09/14/2012 1205   CALCIUM 9.0 09/14/2012 1205   PROT 7.2 09/14/2012 1205   ALBUMIN 4.1 09/14/2012 1205   AST 28 09/14/2012 1205   ALT 22 09/14/2012 1205   ALKPHOS 56 09/14/2012 1205   BILITOT 1.0 09/14/2012 1205   GFRNONAA >60 05/30/2011 1250   GFRAA >60 05/30/2011 1250   No results found for: CHOL, HDL, LDLCALC, LDLDIRECT, TRIG, CHOLHDL No results found for: HGBA1C No results found for: VITAMINB12 No results found for: TSH  12/31/08 carotid u/s 1. There is slight calcific plaque formation at the carotid bifurcations bilaterally.  2. No hemodynamically significant stenosis is seen on either side.  3. There is antegrade flow in both vertebrals.   09/19/12 TTE  - Left ventricle: The cavity size was normal. Wall thickness was normal. Systolic function was normal. The estimated ejection fraction was in the range of 55% to 65%. Wall motion was normal; there were no regional wall motion abnormalities. Features are consistent with a pseudonormal left ventricular filling pattern, with concomitant abnormal relaxation and increased filling pressure (grade 2 diastolic dysfunction). - Atrial septum: No defect or patent foramen ovale  was identified.  11/22/14 EKG [I reviewed images myself and agree with interpretation. -VRP]  - normal sinus rhythm   04/13/16 CT head [I reviewed images myself and agree with interpretation. -VRP]  - Age-indeterminate white matter hypodensity within the right-greater-than-left cerebral hemispheres. Additionally there is suggestion of loss of gray-white differentiation within the anterior right frontal lobe. Findings may represent sequelae of subacute to chronic infarcts.     ASSESSMENT AND PLAN  82 y.o. year old female here with new onset slurred speech, trouble walking, word recall difficulties and abnormal CT head suspicious for subacute ischemic infarcts. Will check further stroke workup.  Ddx: possible bilateral strokes (? Cardio-embolic)  1. Cerebrovascular accident (CVA) due to thrombosis of cerebral artery ([place])   2. Slurred speech   3. Gait difficulty   4. Essential hypertension, benign   5. HYPERCHOLESTEROLEMIA, MIXED   6. Hereditary and idiopathic peripheral neuropathy      PLAN: - stroke workup - continue aspirin 81mg  daily - follow up with PCP re: BP and lipid control and diabetes screening - I will setup PT and ST evaluations - stroke warning signs and education reviewed  Orders Placed This Encounter  Procedures  . MR Brain Wo Contrast  . MR MRA HEAD WO CONTRAST  . MR Angiogram Neck W Wo Contrast  . Hemoglobin A1c  . Lipid Panel  . Ambulatory referral to Physical Therapy  . Ambulatory referral to Speech Therapy  . ECHOCARDIOGRAM COMPLETE   Return in about 1 month (around 05/21/2016).  I reviewed images, labs, notes, records myself. I summarized findings and reviewed with patient, for this high risk condition (stroke, balance difficulty, speech difficulty) requiring high complexity decision making.    Penni Bombard, MD [id number], [id number] AM Certified in Neurology, Neurophysiology and Neuroimaging  [facility] [address] [place], [place] [address] [phone]

## 2016-04-21 LAB — LIPID PANEL
Chol/HDL Ratio: 3.6 ratio units (ref 0.0–4.4)
Cholesterol, Total: 210 mg/dL — ABNORMAL HIGH (ref 100–199)
HDL: 58 mg/dL (ref >39)
LDL Calculated: 133 mg/dL — ABNORMAL HIGH (ref 0–99)
Triglycerides: 97 mg/dL (ref 0–149)
VLDL Cholesterol Cal: 19 mg/dL (ref 5–40)

## 2016-04-21 LAB — HEMOGLOBIN A1C
Est. average glucose Bld gHb Est-mCnc: 137 mg/dL
Hgb A1c MFr Bld: 6.4 % — ABNORMAL HIGH (ref 4.8–5.6)

## 2016-04-22 ENCOUNTER — Telehealth: Payer: Self-pay | Admitting: *Deleted

## 2016-04-22 NOTE — Telephone Encounter (Signed)
LVM requesting call back for lab results. Left name, number.

## 2016-04-22 NOTE — Telephone Encounter (Signed)
Per Dr Leta Baptist, spoke with patient and informed her that her Hgb A1c is elevated, cholesterol and lipids are elevated. Advised she needs to see  PCP for better control of blood sugar and lipids. She stated she has FU with cardiologist in [place] as well as her PCP. Advised she discuss lab results with these physicians. She stated she will begin taking simvastatin; strongly advised she take her medications as ordered. She verbalized understanding, agreement of conversation.

## 2016-04-22 NOTE — Telephone Encounter (Signed)
Patient is returning your call regarding lab results.

## 2016-04-26 ENCOUNTER — Other Ambulatory Visit: Payer: Medicare Other

## 2016-04-26 ENCOUNTER — Ambulatory Visit
Admission: RE | Admit: 2016-04-26 | Discharge: 2016-04-26 | Disposition: A | Discharge status: Home / Self Care | Payer: Medicare Other | Source: Ambulatory Visit | Attending: Diagnostic Neuroimaging | Admitting: Diagnostic Neuroimaging

## 2016-04-26 DIAGNOSIS — I10 Essential (primary) hypertension: Secondary | ICD-10-CM

## 2016-04-26 DIAGNOSIS — R4781 Slurred speech: Secondary | ICD-10-CM

## 2016-04-26 DIAGNOSIS — R269 Unspecified abnormalities of gait and mobility: Secondary | ICD-10-CM

## 2016-04-26 DIAGNOSIS — I6330 Cerebral infarction due to thrombosis of unspecified cerebral artery: Secondary | ICD-10-CM

## 2016-04-26 DIAGNOSIS — E7800 Pure hypercholesterolemia, unspecified: Secondary | ICD-10-CM

## 2016-04-26 MED ORDER — GADOBENATE DIMEGLUMINE 529 MG/ML IV SOLN
13.0000 mL | Freq: Once | INTRAVENOUS | Status: AC | PRN
  Administered 2016-04-26: 13 mL via INTRAVENOUS

## 2016-04-29 ENCOUNTER — Telehealth: Payer: Self-pay | Admitting: Diagnostic Neuroimaging

## 2016-04-29 NOTE — Telephone Encounter (Signed)
Spoke with patient and informed her that Dr Leta Baptist is out of office until next Tues. Informed her this RN will discuss with Dr Leta Baptist, but advised if she does not get call back to call again.  She verbalized understanding, appreciation.

## 2016-04-29 NOTE — Telephone Encounter (Signed)
Pt requesting MRI results. Please call back

## 2016-05-05 NOTE — Telephone Encounter (Signed)
I called patient. Gave MRI and MRA results. Has severe chronic small vessel ischemic disease. No acute findings. Continue med mgmt of risk factors. I will see pt back in follow up. -VRP

## 2016-05-05 NOTE — Telephone Encounter (Signed)
Kim Steinhardt [id number] SAME [id number] WANTS RESULTS FROM MRI, THINKS HER DOCTOR IS BACK FROM VACATION TODAY, PCB N

## 2016-05-06 ENCOUNTER — Other Ambulatory Visit (HOSPITAL_COMMUNITY): Payer: Medicare Other

## 2016-05-13 ENCOUNTER — Telehealth: Payer: Self-pay | Admitting: Diagnostic Neuroimaging

## 2016-05-13 NOTE — Telephone Encounter (Signed)
Dr. Leta Baptist had ordered an Echo.  [name] was schedule for 05-06-16- she called on 05-05-16 and cancel the test.  I spoke with her today to reschedule the test and she stated that she didn't want to reschedule the test.  She wants to wait till she see Dr. Johnsie Cancel next week to see if she need the test.

## 2016-05-21 ENCOUNTER — Encounter: Payer: Self-pay | Admitting: Diagnostic Neuroimaging

## 2016-05-21 ENCOUNTER — Ambulatory Visit (INDEPENDENT_AMBULATORY_CARE_PROVIDER_SITE_OTHER): Payer: Medicare Other | Admitting: Diagnostic Neuroimaging

## 2016-05-21 VITALS — BP 138/74 | HR 82 | Ht 64.0 in | Wt 140.0 lb

## 2016-05-21 DIAGNOSIS — I6789 Other cerebrovascular disease: Secondary | ICD-10-CM | POA: Diagnosis not present

## 2016-05-21 DIAGNOSIS — R269 Unspecified abnormalities of gait and mobility: Secondary | ICD-10-CM

## 2016-05-21 DIAGNOSIS — R4781 Slurred speech: Secondary | ICD-10-CM

## 2016-05-21 NOTE — Progress Notes (Signed)
[facility]  PATIENT: Kim Lane DOB: Nov 21, 1934  REFERRING CLINICIAN: Shamleffer HISTORY FROM: patient and daughter REASON FOR VISIT: follow up    HISTORICAL  CHIEF COMPLAINT:  Chief Complaint  Patient presents with  . Cerebrovascular Accident    rm 6, "some leg weakness, otherwise doing well, not stumbling anymore"  . Follow-up    1 month    HISTORY OF PRESENT ILLNESS:   UPDATE 05/21/16: Since last visit, symptoms are improved. No new symptoms. MRI, MRA results reviewed. BP is improved. Balance is improved.   PRIOR HPI (04/20/16): 82 year old right-handed female here for evaluation of slurred speech and trouble talking. 04/06/16 patient had his new onset slurred speech and balance difficulty. 04/10/16 she had significant balance difficulty and elevated blood pressure of 180/120. Patient has been having trouble with word recall. Patient also having some trouble with swallowing as well as bilateral leg weakness. Patient feeling some additional problems in her left face and left foot. Patient had not been taking aspirin on a regular basis prior to this event. Patient went to PCP for evaluation and then referred to me for neurology consultation.   REVIEW OF SYSTEMS: Full 14 system review of systems performed and negative with exception of: as per HPI.   ALLERGIES: Allergies  Allergen Reactions  . Fosamax [Alendronate Sodium] Other (See Comments)    "made me ache all over"    HOME MEDICATIONS: Outpatient Prescriptions Prior to Visit  Medication Sig Dispense Refill  . aspirin 81 MG tablet Take 81 mg by mouth daily.      [name] Kitchen co-enzyme Q-10 30 MG capsule Take 30 mg by mouth daily.     [name] Kitchen glucosamine-chondroitin 500-400 MG tablet Take 1 tablet by mouth daily as needed (for arthritis).     [name] Kitchen levothyroxine (SYNTHROID, LEVOTHROID) 88 MCG tablet Take 88 mcg by mouth daily.    . metoprolol succinate (TOPROL-XL) 25 MG 24 hr tablet Take 12.5 mg by mouth every other  day.     . Multiple Vitamins-Minerals (CENTRUM SILVER PO) Take 1 tablet by mouth daily.      . nitroGLYCERIN (NITROLINGUAL) 0.4 MG/SPRAY spray Place 1 spray under the tongue every 5 (five) minutes x 3 doses as needed for chest pain (3 sprays max).    . Omega-3 Fatty Acids (FISH OIL) 1000 MG CAPS Take 1 capsule by mouth every other day.     . Red Yeast Rice Extract (RED YEAST RICE PO) Take 1 tablet by mouth every other day.     . RESTASIS 0.05 % ophthalmic emulsion Place 2 drops into both eyes daily as needed. For dry eyes    . simvastatin (ZOCOR) 5 MG tablet Take 5 mg by mouth daily.     No facility-administered medications prior to visit.    PAST MEDICAL HISTORY: Past Medical History  Diagnosis Date  . CAD (coronary artery disease) 02/2006    Taxus stent placed to LAD and Diagonal per Dr. Olevia Perches  . Hypercholesterolemia   . HTN (hypertension)   . Hypothyroidism   . IBS (irritable bowel syndrome)   . Osteoarthritis   . Stroke [facility]) 03/2016    PAST SURGICAL HISTORY: Past Surgical History  Procedure Laterality Date  . Appendectomy    . Vaginal hysterectomy    . Stents      in heart  . Total knee arthroplasty Bilateral 2011,2012    FAMILY HISTORY: Family History  Problem Relation Age of Onset  . Diabetes Mother   . Dementia Mother   .  ALS Mother   . Heart Problems Father   . Liver disease Sister   . Cirrhosis Sister   . Heart block      CABG  . Diabetes Sister   . Heart block Son     CABG  . Other [place]  . Other Daughter     [place]  . Dementia Sister     SOCIAL HISTORY:  Social History   Social History  . Marital Status: Widowed    Spouse Name: N/A  . Number of Children: 2  . Years of Education: 12   Occupational History  .  [employer]   Social History Main Topics  . Smoking status: Former Research scientist (life sciences)  . Smokeless tobacco: Not on file     Comment: quit smoking 30 years ago, very light smoker  . Alcohol  Use: No  . Drug Use: No  . Sexual Activity: Not on file   Other Topics Concern  . Not on file   Social History Narrative   Lives alone   caffeine use- coffee- 5 cups daily     PHYSICAL EXAM  GENERAL EXAM/CONSTITUTIONAL: Vitals:  Filed Vitals:   05/21/16 1542  BP: 138/74  Pulse: 82  Height: 5\' 4"  (1.626 m)  Weight: 140 lb (63.504 kg)   Body mass index is 24.02 kg/(m^2). No exam data present  Patient is in no distress; well developed, nourished and groomed; neck is supple  CARDIOVASCULAR:  Examination of carotid arteries is normal; no carotid bruits  Regular rate and rhythm, no murmurs  Examination of peripheral vascular system by observation and palpation is normal  RADIAL PULSES SYMM; NO SUBCLAVIAN BRUITS  EYES:  Ophthalmoscopic exam of optic discs and posterior segments is normal; no papilledema or hemorrhages  MUSCULOSKELETAL:  Gait, strength, tone, movements noted in Neurologic exam below  NEUROLOGIC: MENTAL STATUS:  No flowsheet data found.  awake, alert, oriented to person, place and time  recent and remote memory intact  normal attention and concentration  language fluent, comprehension intact, naming intact,   fund of knowledge appropriate  CRANIAL NERVE:   2nd - no papilledema on fundoscopic exam  2nd, 3rd, 4th, 6th - pupils equal and reactive to light, visual fields full to confrontation, extraocular muscles intact, no nystagmus  5th - facial sensation symmetric  7th - facial strength symmetric  8th - hearing intact  9th - palate elevates symmetrically, uvula midline  11th - shoulder shrug symmetric  12th - tongue protrusion midline  MOTOR:   normal bulk and tone, full strength in the BUE, BLE  SENSORY:   normal and symmetric to light touch, temperature, vibration; EXCEPT DECR PP, TEMP AND VIB AT FEET  COORDINATION:   finger-nose-finger, fine finger movements normal  REFLEXES:   deep tendon reflexes --> BUE 2, KNEES  1, ANKLES 0  GAIT/STATION:   narrow based gait; ROMBERG NEG; ABLE TO TANDEM    DIAGNOSTIC DATA (LABS, IMAGING, TESTING) - I reviewed patient records, labs, notes, testing and imaging myself where available.  Lab Results  Component Value Date   WBC 7.2 05/30/2011   HGB 12.3 05/30/2011   HCT 36.6 05/30/2011   MCV 84.5 05/30/2011   PLT 383 05/30/2011      Component Value Date/Time   NA 140 09/14/2012 1205   K 4.3 09/14/2012 1205   CL 102 09/14/2012 1205   CO2 29 09/14/2012 1205   GLUCOSE 101* 09/14/2012 1205  BUN 14 09/14/2012 1205   CREATININE 0.9 09/14/2012 1205   CALCIUM 9.0 09/14/2012 1205   PROT 7.2 09/14/2012 1205   ALBUMIN 4.1 09/14/2012 1205   AST 28 09/14/2012 1205   ALT 22 09/14/2012 1205   ALKPHOS 56 09/14/2012 1205   BILITOT 1.0 09/14/2012 1205   GFRNONAA >60 05/30/2011 1250   GFRAA >60 05/30/2011 1250   Lab Results  Component Value Date   CHOL 210* 04/20/2016   HDL 58 04/20/2016   LDLCALC 133* 04/20/2016   TRIG 97 04/20/2016   CHOLHDL 3.6 04/20/2016   Lab Results  Component Value Date   HGBA1C 6.4* 04/20/2016   No results found for: VITAMINB12 No results found for: TSH  12/31/08 carotid u/s 1. There is slight calcific plaque formation at the carotid bifurcations bilaterally.  2. No hemodynamically significant stenosis is seen on either side.  3. There is antegrade flow in both vertebrals.   09/19/12 TTE  - Left ventricle: The cavity size was normal. Wall thickness was normal. Systolic function was normal. The estimated ejection fraction was in the range of 55% to 65%. Wall motion was normal; there were no regional wall motion abnormalities. Features are consistent with a pseudonormal left ventricular filling pattern, with concomitant abnormal relaxation and increased filling pressure (grade 2 diastolic dysfunction). - Atrial septum: No defect or patent foramen ovale was identified.  11/22/14 EKG [I reviewed images myself  and agree with interpretation. -VRP]  - normal sinus rhythm   04/13/16 CT head [I reviewed images myself and agree with interpretation. -VRP]  - Age-indeterminate white matter hypodensity within the right-greater-than-left cerebral hemispheres. Additionally there is suggestion of loss of gray-white differentiation within the anterior right frontal lobe. Findings may represent sequelae of subacute to chronic infarcts.  04/26/16 MRI of the brain without contrast [I reviewed images myself and agree with interpretation. -VRP]  1. Extensive T2/FLAIR hyperintense foci in both hemispheres, much more on the right than the left. There are large confluencies of the white matter foci on the right. The foci are most consistent with chronic ischemic change, more than expected for age 55. There are no acute findings.  04/26/16 MRI of the intracranial arteries shows the following: 1. There is a possible focal region of stenosis within one of the M2 segments on the left, though this could be artifactual as no region of definite stenosis was noted on the source images 2. The other arteries appear to have normal flow. 3. No aneurysms were detected.  04/26/16 MR angiogram of the neck vessels showing normal antegrade flow with no focal region of stenosis. The origin of the left vertebral artery is not well seen but this is likely to be artifactual as the study is otherwise normal.    ASSESSMENT AND PLAN  82 y.o. year old female here with new onset slurred speech, trouble walking, word recall difficulties and abnormal CT head suspicious for subacute ischemic infarcts. No acute-subacute findings on MRI. Has significant chronic small vessel ischemic disease.    Dx:   1. Slurred speech   2. Gait difficulty   3. Acute, but ill-defined, cerebrovascular disease ([place])      PLAN: - continue aspirin 81mg  daily - follow up with PCP re: BP and lipid control and diabetes screening - stroke warning signs  and education reviewed - brain healthy habits reviewed (nutrition, hydration, physical activity, mental stimulation, social stimulation)  Return in about 6 months (around 11/20/2016).   Penni Bombard, MD [id number], [id number] PM Certified in  Neurology, Neurophysiology and Neuroimaging  [facility] [address], [place], [place] [address] [phone]

## 2016-05-22 NOTE — Progress Notes (Signed)
Patient ID: Kim Lane, female   DOB: [birth year], 82 y.o.   MRN: [id number]   Adonna is seen today in followup for her coronary artery disease. She has stents to the LAD and diagonal placed by Dr. Juanda Chance in 2007. She had a nonischemic Myoview in 2012 prior to orthopedic surgery . She will miss her Zocor and aspirin does on occasion. I explained to her the importance of taking both these medications in reg She is not having any PND or orthopnea there's been no claudication or syncope. She had RTKR by Dr Lequita Halt on 1/12 with no complications Still struggling with statins. Failed crestor and lipitor. Able to take zocor and red yeast with LDL of 105 on recent labs from [place].  F/U Myovue 09/06/13 normal EF 80% with no ischemia  No complaints today Compliant with meds  Retired  from [employer] BP readings reviewed and normal  Having a couple of falls which seem to be mechanical and from non cardiac dizzyness  Larey Seat a few months ago Dr Darrelyn Hillock thought she needed rotator cuff surgery but she has had improvement with chiropractor   Recent rash on steroids.  Also ? Recent neuro w/u for slurred speech and leg weakness. Daughter concerned but patient did not notice anything MRI normal MRI ? Low perfusion on right side but no definitive stroke  Reviewed notes from neuro as well as CT, MRI, MRA  ROS: Denies fever, malais, weight loss, blurry vision, decreased visual acuity, cough, sputum, SOB, hemoptysis, pleuritic pain, palpitaitons, heartburn, abdominal pain, melena, lower extremity edema, claudication, or rash.  All other systems reviewed and negative  General: Affect appropriate Healthy:  appears stated age HEENT: normal Neck supple with no adenopathy JVP normal no bruits no thyromegaly Lungs clear with no wheezing and good diaphragmatic motion Heart:  S1/S2 1/6 SEM RUSB murmur, no rub, gallop or click PMI normal Abdomen: benighn, BS positve, no  tenderness, no AAA no bruit.  No HSM or HJR Distal pulses intact with no bruits No edema varicose veings  Neuro non-focal Skin warm and dry rash resolved with LE varicosities  S/P bilateral knee replacements    Current Outpatient Prescriptions  Medication Sig Dispense Refill  . aspirin 81 MG tablet Take 81 mg by mouth daily.      [name] Kitchen co-enzyme Q-10 30 MG capsule Take 30 mg by mouth daily.     [name] Kitchen glucosamine-chondroitin 500-400 MG tablet Take 1 tablet by mouth daily as needed (for arthritis).     [name] Kitchen levothyroxine (SYNTHROID, LEVOTHROID) 88 MCG tablet Take 88 mcg by mouth daily.    . metoprolol succinate (TOPROL-XL) 25 MG 24 hr tablet Take 12.5 mg by mouth every other day.     . Multiple Vitamins-Minerals (CENTRUM SILVER PO) Take 1 tablet by mouth daily.      . nitroGLYCERIN (NITROLINGUAL) 0.4 MG/SPRAY spray Place 1 spray under the tongue every 5 (five) minutes x 3 doses as needed for chest pain (3 sprays max).    . Omega-3 Fatty Acids (FISH OIL) 1000 MG CAPS Take 1 capsule by mouth every other day.     . Red Yeast Rice Extract (RED YEAST RICE PO) Take 1 tablet by mouth every other day.     . RESTASIS 0.05 % ophthalmic emulsion Place 2 drops into both eyes daily as needed. For dry eyes    . simvastatin (ZOCOR) 5 MG tablet Take 5 mg by mouth daily.    [name] Kitchen triamcinolone  cream (KENALOG) 0.1 % Apply 1 application topically daily.      No current facility-administered medications for this visit.    Allergies  Fosamax  Electrocardiogram:  12/3/ 2015 SR rate 63 normal ECG no change from 2014  05/25/16  NSR rate 79  Normal eCG   Assessment and Plan Dizzyness:  Non cardiac not postural BP and HR ok  F/u primary CAD:  Stent to LAD/Diagonal 2007  Normal myovue 2014  Stable with no angina and good activity level.  Continue medical Rx ChoL  Intolerant to statins due to muscle pain.  Not taking zocor  Occasionally takes red yeast rice Refer to lipid clinic for possible praluent Thyroid  Continue  replacement TSH normal Shoulder:  Would need myovue to clear for surgery improved with PT and chiroprator TIA:  Non focal neuro exam for me.  F/U echo with bubble study.  ECG totally normal but would likely benefit from ILR To r/o PAF given MRI abnormalities.  Discussed with EP  Rash resolved with prednisone not clear if it is related to neuro issues ? If symptoms return check ESR   [name]

## 2016-05-25 ENCOUNTER — Encounter: Payer: Self-pay | Admitting: Cardiovascular Disease

## 2016-05-25 ENCOUNTER — Ambulatory Visit (INDEPENDENT_AMBULATORY_CARE_PROVIDER_SITE_OTHER): Payer: Medicare Other | Admitting: Cardiovascular Disease

## 2016-05-25 VITALS — BP 130/62 | HR 90 | Ht 63.0 in | Wt 139.0 lb

## 2016-05-25 DIAGNOSIS — I2583 Coronary atherosclerosis due to lipid rich plaque: Principal | ICD-10-CM

## 2016-05-25 DIAGNOSIS — I10 Essential (primary) hypertension: Secondary | ICD-10-CM | POA: Diagnosis not present

## 2016-05-25 DIAGNOSIS — I2510 Atherosclerotic heart disease of native coronary artery without angina pectoris: Secondary | ICD-10-CM | POA: Diagnosis not present

## 2016-05-25 DIAGNOSIS — G458 Other transient cerebral ischemic attacks and related syndromes: Secondary | ICD-10-CM | POA: Diagnosis not present

## 2016-05-25 NOTE — Patient Instructions (Addendum)
Medication Instructions:  Your physician recommends that you continue on your current medications as directed. Please refer to the Current Medication list given to you today.  Labwork: NONE  Testing/Procedures: Your physician has requested that you have an echocardiogram. Echocardiography is a painless test that uses sound waves to create images of your heart. It provides your doctor with information about the size and shape of your heart and how well your heart's chambers and valves are working. This procedure takes approximately one hour. There are no restrictions for this procedure.  Follow-Up: Your physician wants you to follow-up in: 3 months with Dr. Johnsie Cancel.   Your physician recommends that you schedule a follow-up appointment as soon as possible with EP to discuss loop recorder for questionable TIA.    If you need a refill on your cardiac medications before your next appointment, please call your pharmacy.

## 2016-06-05 LAB — BASIC METABOLIC PANEL
BUN: 14 (ref 4–21)
Creatinine: 1 (ref 0.5–1.1)
Glucose: 102
Potassium: 5.5 — AB (ref 3.4–5.3)
Sodium: 138 (ref 137–147)

## 2016-06-05 LAB — HEPATIC FUNCTION PANEL
ALT: 13 (ref 7–35)
AST: 17 (ref 13–35)
Alkaline Phosphatase: 66 (ref 25–125)
Bilirubin, Total: 0.5

## 2016-06-05 LAB — CBC AND DIFFERENTIAL
HCT: 35 — AB (ref 36–46)
Hemoglobin: 11.6 — AB (ref 12.0–16.0)
Platelets: 660 — AB (ref 150–399)
WBC: 9.2

## 2016-06-05 LAB — LIPID PANEL
Cholesterol: 178 (ref 0–200)
HDL: 48 (ref 35–70)
LDL Cholesterol: 119
Triglycerides: 56 (ref 40–160)

## 2016-06-05 LAB — TSH: TSH: 0.48 (ref 0.41–5.90)

## 2016-06-07 NOTE — Progress Notes (Signed)
soles is readily vessels very sessile    Electrophysiology Office Note   Date:  06/08/2016   ID:  Kim Lane, DOB 05-29-1934, MRN [id number]  PCP:  Kelton Pillar Herschell Dimes, MD  Cardiologist:  Johnsie Cancel Primary Electrophysiologist:  Sherwood Castilla Meredith Leeds, MD    Chief Complaint  Patient presents with  . Advice Only     History of Present Illness: Kim Lane is a 82 y.o. female who presents today for electrophysiology evaluation.   Hx CAD with LAD and diag stents, HLD, HTN, and CVA.  Had had TIA and strokes.  Referred for possibility of LINQ monitor.  In the past 3 months, she says that she has seen up to 10 doctors since her TIAs. She was having trouble with slurred speech at that time. She says that she has gotten much better since then. Her only complaint now is that she is more fatigued and weak, with some leg pain. She otherwise is doing well and has no acute complaints.   Today, she denies symptoms of palpitations, chest pain, shortness of breath, orthopnea, PND, lower extremity edema, claudication, dizziness, presyncope, syncope, bleeding, or neurologic sequela. The patient is tolerating medications without difficulties and is otherwise without complaint today.    Past Medical History  Diagnosis Date  . CAD (coronary artery disease) 02/2006    Taxus stent placed to LAD and Diagonal per Dr. Olevia Perches  . Hypercholesterolemia   . HTN (hypertension)   . Hypothyroidism   . IBS (irritable bowel syndrome)   . Osteoarthritis   . Stroke [facility]) 03/2016   Past Surgical History  Procedure Laterality Date  . Appendectomy    . Vaginal hysterectomy    . Stents      in heart  . Total knee arthroplasty Bilateral 2011,2012     Current Outpatient Prescriptions  Medication Sig Dispense Refill  . aspirin 81 MG tablet Take 81 mg by mouth daily.      [name] Kitchen co-enzyme Q-10 30 MG capsule Take 30 mg by mouth daily.     [name] Kitchen glucosamine-chondroitin 500-400 MG tablet Take 1 tablet by mouth daily as  needed (for arthritis).     [name] Kitchen levothyroxine (SYNTHROID, LEVOTHROID) 88 MCG tablet Take 88 mcg by mouth daily.    . metoprolol succinate (TOPROL-XL) 25 MG 24 hr tablet Take 12.5 mg by mouth every other day.     . Multiple Vitamins-Minerals (CENTRUM SILVER PO) Take 1 tablet by mouth daily.      . nitroGLYCERIN (NITROLINGUAL) 0.4 MG/SPRAY spray Place 1 spray under the tongue every 5 (five) minutes x 3 doses as needed for chest pain (3 sprays max).    . Omega-3 Fatty Acids (FISH OIL) 1000 MG CAPS Take 1 capsule by mouth every other day.     . Red Yeast Rice Extract (RED YEAST RICE PO) Take 1 tablet by mouth every other day.     . RESTASIS 0.05 % ophthalmic emulsion Place 2 drops into both eyes daily as needed. For dry eyes    . simvastatin (ZOCOR) 5 MG tablet Take 5 mg by mouth daily.    [name] Kitchen triamcinolone cream (KENALOG) 0.1 % Apply 1 application topically daily.      No current facility-administered medications for this visit.    Allergies:   Fosamax   Social History:  The patient  reports that she has quit smoking. She does not have any smokeless tobacco history on file. She reports that she does not drink alcohol or use illicit drugs.  Family History:  The patient's family history includes ALS in her mother; Cirrhosis in her sister; Dementia in her mother and sister; Diabetes in her mother and sister; Heart Problems in her father; Heart block in her son; Liver disease in her sister; Other in her brother and daughter.    ROS:  Please see the history of present illness.   Otherwise, review of systems is positive for rash.   All other systems are reviewed and negative.    PHYSICAL EXAM: VS:  BP 122/58 mmHg  Pulse 75  Ht 5\' 4"  (1.626 m)  Wt 136 lb 9.6 oz (61.961 kg)  BMI 23.44 kg/m2  SpO2 94% , BMI Body mass index is 23.44 kg/(m^2). GEN: Well nourished, well developed, in no acute distress HEENT: normal Neck: no JVD, carotid bruits, or masses Cardiac: RRR; no murmurs, rubs, or gallops,no  edema  Respiratory:  clear to auscultation bilaterally, normal work of breathing GI: soft, nontender, nondistended, + BS MS: no deformity or atrophy Skin: warm and dry Neuro:  Strength and sensation are intact Psych: euthymic mood, full affect  EKG:  EKG is not ordered today.  Recent Labs: No results found for requested labs within last 365 days.    Lipid Panel     Component Value Date/Time   CHOL 210* 04/20/2016 1036   TRIG 97 04/20/2016 1036   HDL 58 04/20/2016 1036   CHOLHDL 3.6 04/20/2016 1036   LDLCALC 133* 04/20/2016 1036     Wt Readings from Last 3 Encounters:  06/08/16 136 lb 9.6 oz (61.961 kg)  05/25/16 139 lb (63.05 kg)  05/21/16 140 lb (63.504 kg)      Other studies Reviewed: Additional studies/ records that were reviewed today include: cardiology notes  ASSESSMENT AND PLAN:  1.  CVA: Discussed with her the options of further monitoring for her CVA in the past. We discussed the option to Linq monitoring and 30 day monitor. She did say that she is going to have an echocardiogram in the future, and wants to hold off on her decision until then. She does say that she is leaning towards a 30 day monitor. She Kim Lane call us back with her decision.    Current medicines are reviewed at length with the patient today.   The patient does not have concerns regarding her medicines.  The following changes were made today:  none  Labs/ tests ordered today include:  No orders of the defined types were placed in this encounter.     Disposition:   FU with Annalyn Blecher PRN  Signed, Camia Dipinto Meredith Leeds, MD  06/08/2016 11:19 AM     [facility] [address] [place]  [phone] (office) [phone] (fax)

## 2016-06-08 ENCOUNTER — Encounter: Payer: Self-pay | Admitting: Cardiology

## 2016-06-08 ENCOUNTER — Ambulatory Visit (INDEPENDENT_AMBULATORY_CARE_PROVIDER_SITE_OTHER): Payer: Medicare Other | Admitting: Cardiology

## 2016-06-08 VITALS — BP 122/58 | HR 75 | Ht 64.0 in | Wt 136.6 lb

## 2016-06-08 DIAGNOSIS — G459 Transient cerebral ischemic attack, unspecified: Secondary | ICD-10-CM | POA: Diagnosis not present

## 2016-06-08 NOTE — Patient Instructions (Addendum)
Medication Instructions:  Your physician recommends that you continue on your current medications as directed. Please refer to the Current Medication list given to you today.  Labwork: None  ordered  Testing/Procedures: None  ordered  Follow-Up: Please call the office when you are ready to proceed/schedule 30 day monitor or loop recorder implant.  If you need a refill on your cardiac medications before your next appointment, please call your pharmacy.   Thank you for choosing [facility]!! Trinidad Curet, RN [phone]   Any Other Special Instructions Will Be Listed Below (If Applicable). Cardiac Event Monitoring A cardiac event monitor is a small recording device used to help detect abnormal heart rhythms (arrhythmias). The monitor is used to record heart rhythm when noticeable symptoms such as the following occur:  Fast heartbeats (palpitations), such as heart racing or fluttering.  Dizziness.  Fainting or light-headedness.  Unexplained weakness. The monitor is wired to two electrodes placed on your chest. Electrodes are flat, sticky disks that attach to your skin. The monitor can be worn for up to 30 days. You will wear the monitor at all times, except when bathing.  HOW TO USE YOUR CARDIAC EVENT MONITOR A technician will prepare your chest for the electrode placement. The technician will show you how to place the electrodes, how to work the monitor, and how to replace the batteries. Take time to practice using the monitor before you leave the office. Make sure you understand how to send the information from the monitor to your health care provider. This requires a telephone with a landline, not a cell phone. You need to:  Wear your monitor at all times, except when you are in water:  Do not get the monitor wet.  Take the monitor off when bathing. Do not swim or use a hot tub with it on.  Keep your skin clean. Do not put body lotion or moisturizer on your  chest.  Change the electrodes daily or any time they stop sticking to your skin. You might need to use tape to keep them on.  It is possible that your skin under the electrodes could become irritated. To keep this from happening, try to put the electrodes in slightly different places on your chest. However, they must remain in the area under your left breast and in the upper right section of your chest.  Make sure the monitor is safely clipped to your clothing or in a location close to your body that your health care provider recommends.  Press the button to record when you feel symptoms of heart trouble, such as dizziness, weakness, light-headedness, palpitations, thumping, shortness of breath, unexplained weakness, or a fluttering or racing heart. The monitor is always on and records what happened slightly before you pressed the button, so do not worry about being too late to get good information.  Keep a diary of your activities, such as walking, doing chores, and taking medicine. It is especially important to note what you were doing when you pushed the button to record your symptoms. This will help your health care provider determine what might be contributing to your symptoms. The information stored in your monitor will be reviewed by your health care provider alongside your diary entries.  Send the recorded information as recommended by your health care provider. It is important to understand that it will take some time for your health care provider to process the results.  Change the batteries as recommended by your health care provider. SEEK IMMEDIATE  MEDICAL CARE IF:   You have chest pain.  You have extreme difficulty breathing or shortness of breath.  You develop a very fast heartbeat that persists.  You develop dizziness that does not go away.  You faint or constantly feel you are about to faint.   This information is not intended to replace advice given to you by your health  care provider. Make sure you discuss any questions you have with your health care provider.   Document Released: 09/15/2008 Document Revised: 12/28/2014 Document Reviewed: 06/05/2013 Elsevier Interactive Patient Education [employer].

## 2016-06-11 ENCOUNTER — Other Ambulatory Visit: Payer: Self-pay

## 2016-06-11 ENCOUNTER — Ambulatory Visit (HOSPITAL_COMMUNITY): Payer: Medicare Other | Attending: Cardiology

## 2016-06-11 DIAGNOSIS — G458 Other transient cerebral ischemic attacks and related syndromes: Secondary | ICD-10-CM

## 2016-06-11 DIAGNOSIS — I119 Hypertensive heart disease without heart failure: Secondary | ICD-10-CM | POA: Insufficient documentation

## 2016-06-11 DIAGNOSIS — E785 Hyperlipidemia, unspecified: Secondary | ICD-10-CM | POA: Diagnosis not present

## 2016-06-11 DIAGNOSIS — I059 Rheumatic mitral valve disease, unspecified: Secondary | ICD-10-CM | POA: Diagnosis not present

## 2016-06-11 DIAGNOSIS — Z8249 Family history of ischemic heart disease and other diseases of the circulatory system: Secondary | ICD-10-CM | POA: Insufficient documentation

## 2016-06-11 DIAGNOSIS — Z87891 Personal history of nicotine dependence: Secondary | ICD-10-CM | POA: Insufficient documentation

## 2016-06-11 DIAGNOSIS — G459 Transient cerebral ischemic attack, unspecified: Secondary | ICD-10-CM | POA: Diagnosis present

## 2016-06-19 ENCOUNTER — Telehealth: Payer: Self-pay | Admitting: Cardiovascular Disease

## 2016-06-19 NOTE — Telephone Encounter (Signed)
Patient is aware of echo results.

## 2016-06-19 NOTE — Telephone Encounter (Signed)
F/u  Pt returning RN phone call- Echo results. Please call home # Please call back and discuss.

## 2016-06-25 ENCOUNTER — Telehealth: Payer: Self-pay | Admitting: Cardiology

## 2016-06-25 NOTE — Telephone Encounter (Signed)
New message      The pt calling to speak with the Md, and the event monitor. The pt wants to know what's to happen when it's done.   The pt just looked a the list from the office and 1 of the medication is not listed name Levothyroxine 88 mg po(synthroid) the pt states she has been on it for years but it is not on her list

## 2016-06-25 NOTE — Telephone Encounter (Signed)
Patient tells me that she contacted her insurance to check into cost out-of-pocket for 30 day monitor and they told her to call our office and we could tell her. Informed patient that we cannot give her out-of-pocket expense, this would be info that insurance should be giving her. Explained that I would speak with our billing dept about this and get back with her.  Explained that she probably needs a "code" for insurance to give her out-of-pocket estimate. She thanks me for helping.

## 2016-06-25 NOTE — Telephone Encounter (Signed)
Spoke w/ billing and our holter/monitor department. Patient given number to [employer] (monitor company) to discuss what her cost might be. She will call me back if they are unable to give her information she is seeking.

## 2016-07-01 ENCOUNTER — Other Ambulatory Visit: Payer: Self-pay | Admitting: Cardiology

## 2016-07-01 ENCOUNTER — Telehealth: Payer: Self-pay | Admitting: *Deleted

## 2016-07-01 ENCOUNTER — Telehealth: Payer: Self-pay | Admitting: Cardiology

## 2016-07-01 DIAGNOSIS — G459 Transient cerebral ischemic attack, unspecified: Secondary | ICD-10-CM

## 2016-07-01 DIAGNOSIS — I639 Cerebral infarction, unspecified: Secondary | ICD-10-CM

## 2016-07-01 DIAGNOSIS — R42 Dizziness and giddiness: Secondary | ICD-10-CM

## 2016-07-01 DIAGNOSIS — I4891 Unspecified atrial fibrillation: Secondary | ICD-10-CM

## 2016-07-01 NOTE — Telephone Encounter (Signed)
NeW Message  Pt requested to speak w/ RN about event monitor- pt wnted to proceed- no order in syst. Please call back and discuss.

## 2016-07-01 NOTE — Telephone Encounter (Signed)
Patient given telephone number at [employer] to find out amount to be billed to [facility] Medicare for a CEM.  Patient states she has already been told by her insurance company, she will need to pay 20% of cost.

## 2016-07-01 NOTE — Telephone Encounter (Signed)
Patient would like to proceed w/ event monitor.  She is aware order will be placed in EPIC and someone will call her to schedule this. She is appreciative

## 2016-07-06 ENCOUNTER — Ambulatory Visit (INDEPENDENT_AMBULATORY_CARE_PROVIDER_SITE_OTHER): Payer: Medicare Other

## 2016-07-06 ENCOUNTER — Encounter: Payer: Self-pay | Admitting: Cardiovascular Disease

## 2016-07-06 DIAGNOSIS — R42 Dizziness and giddiness: Secondary | ICD-10-CM

## 2016-07-06 DIAGNOSIS — I4891 Unspecified atrial fibrillation: Secondary | ICD-10-CM | POA: Diagnosis not present

## 2016-07-06 DIAGNOSIS — G459 Transient cerebral ischemic attack, unspecified: Secondary | ICD-10-CM

## 2016-07-13 ENCOUNTER — Telehealth: Payer: Self-pay | Admitting: Cardiovascular Disease

## 2016-07-13 NOTE — Telephone Encounter (Signed)
New Message  Pt call requesting to speak with RN about recent echo results. Please call back to discuss

## 2016-07-13 NOTE — Telephone Encounter (Signed)
Called patient with echo results as written below. Patient verbalized understanding.  Notes Recorded by Josue Hector, MD on 06/12/2016 at 8:43 AM Good EF normal no bad valves

## 2016-07-17 ENCOUNTER — Telehealth: Payer: Self-pay | Admitting: Cardiology

## 2016-07-17 NOTE — Telephone Encounter (Signed)
Pt was calling to inform Dr Curt Bears that her charger on her monitor is not working correctly and she had to call the monitor company to request another Games developer.  Pt states that the new charger will not arrive until 3 days or so.  Pt would like advise on what to do?  Informed the pt that I spoke with our monitor tech Ashley, and she states that the pt should go ahead and remove the monitor, and place it back on once she receives the new charger.  Informed the pt that per [facility], unfortunately, she has a defective monitor and that's the only option, until the new charger comes in the mail.  Pt states she is asymptomatic, and has no cardiac complaints.  Advised the pt that while she is awaiting the new monitor, if she were to become symptomatic from a cardiac perspective, then she should contact our on-call or refer to the ER for further eval. Pt verbalized understanding and agrees with this plan.  Pt gracious for all the assistance provided.

## 2016-07-17 NOTE — Telephone Encounter (Signed)
New message  Pt call requesting to speak with RN. Pt states her monitor made a strange noise and she call the the monitor company. Pt starts the company is going to send her a Games developer for monitor [place] the mall. Pt starts it will take three days to get charger. Pt would like a call back to discuss if waiting on charger is good idea. Please call back to discuss.

## 2016-08-13 ENCOUNTER — Telehealth: Payer: Self-pay | Admitting: Cardiovascular Disease

## 2016-08-13 NOTE — Telephone Encounter (Signed)
Called patient with cardiac event monitor results. Patient stated she does not want to have a linq put in and she will just follow up with Dr. Johnsie Cancel.

## 2016-08-13 NOTE — Telephone Encounter (Signed)
Follow Up:; ° ° °Returning your call. °

## 2016-08-21 ENCOUNTER — Ambulatory Visit: Payer: Medicare Other | Admitting: Cardiovascular Disease

## 2016-09-08 LAB — LIPID PANEL
Cholesterol: 181 (ref 0–200)
HDL: 52 (ref 35–70)
LDL Cholesterol: 115
Triglycerides: 69 (ref 40–160)

## 2016-09-08 LAB — BASIC METABOLIC PANEL
BUN: 11 (ref 4–21)
Creatinine: 1 (ref 0.5–1.1)
Glucose: 157
Potassium: 4.4 (ref 3.4–5.3)
Sodium: 140 (ref 137–147)

## 2016-09-08 LAB — CBC AND DIFFERENTIAL
HCT: 32 — AB (ref 36–46)
Hemoglobin: 10.1 — AB (ref 12.0–16.0)
Platelets: 638 — AB (ref 150–399)
WBC: 12.4

## 2016-09-08 LAB — TSH: TSH: 1.17 (ref <=5.90)

## 2016-09-08 LAB — HEPATIC FUNCTION PANEL
ALT: 15 (ref 7–35)
AST: 16 (ref 13–35)
Alkaline Phosphatase: 84 (ref 25–125)
Bilirubin, Total: 0.4

## 2016-09-10 ENCOUNTER — Encounter: Payer: Self-pay | Admitting: Cardiovascular Disease

## 2016-09-23 ENCOUNTER — Other Ambulatory Visit: Payer: Self-pay

## 2016-09-23 DIAGNOSIS — Z1231 Encounter for screening mammogram for malignant neoplasm of breast: Secondary | ICD-10-CM

## 2016-09-27 NOTE — Progress Notes (Signed)
Patient ID: Kim Lane, female   DOB: 1934/08/16, 82 y.o.   MRN: [id number]   Kim Lane is seen today in followup for her coronary artery disease. She has stents to the LAD and diagonal placed by Dr. Olevia Perches in 2007. She had a nonischemic Myoview in 2012 prior to orthopedic surgery . She will miss her Zocor and aspirin does on occasion. I explained to her the importance of taking both these medications in reg She is not having any PND or orthopnea there's been no claudication or syncope. She had RTKR by Dr Wynelle Link on 0000000 with no complications Still struggling with statins. Failed crestor and lipitor. Able to take zocor and red yeast with LDL of 105 on recent labs from [place].  F/U Myovue 09/06/13 normal EF 80% with no ischemia  No complaints today Compliant with meds  Retired  from [employer] some [place] BP readings reviewed and normal  Having a couple of falls which seem to be mechanical and from non cardiac dizzyness  [address] a few months ago Dr Gladstone Lighter thought she needed rotator cuff surgery but she has had improvement with chiropractor   Neuro w/u for slurred speech and leg weakness. Daughter concerned but patient did not notice anything MRI normal MRI ? Low perfusion on right side but no definitive stroke  Reviewed notes from neuro as well as CT, MRI, MRA  No history of PAF. 30 day event monitor echo and bubble negative Seen by EP Dr Curt Bears and patient defers LINQ  ROS: Denies fever, malais, weight loss, blurry vision, decreased visual acuity, cough, sputum, SOB, hemoptysis, pleuritic pain, palpitaitons, heartburn, abdominal pain, melena, lower extremity edema, claudication, or rash.  All other systems reviewed and negative  General: Affect appropriate Healthy:  appears stated age 35: normal Neck supple with no adenopathy JVP normal no bruits no thyromegaly Lungs clear with no wheezing and good diaphragmatic motion Heart:  S1/S2 1/6 SEM RUSB  murmur, no rub, gallop or click PMI normal Abdomen: benighn, BS positve, no tenderness, no AAA no bruit.  No HSM or HJR Distal pulses intact with no bruits No edema varicose veings  Neuro non-focal Skin warm and dry rash resolved with LE varicosities  S/P bilateral knee replacements    Current Outpatient Prescriptions  Medication Sig Dispense Refill  . aspirin 81 MG tablet Take 81 mg by mouth daily.      [name] Kitchen co-enzyme Q-10 30 MG capsule Take 30 mg by mouth daily.     [name] Kitchen glucosamine-chondroitin 500-400 MG tablet Take 1 tablet by mouth daily as needed (for arthritis).     [name] Kitchen levothyroxine (SYNTHROID, LEVOTHROID) 88 MCG tablet Take 88 mcg by mouth daily.    . metoprolol succinate (TOPROL-XL) 25 MG 24 hr tablet Take 12.5 mg by mouth every other day.     . Multiple Vitamins-Minerals (CENTRUM SILVER PO) Take 1 tablet by mouth daily.      . nitroGLYCERIN (NITROLINGUAL) 0.4 MG/SPRAY spray Place 1 spray under the tongue every 5 (five) minutes x 3 doses as needed for chest pain (3 sprays max).    . Omega-3 Fatty Acids (FISH OIL) 1000 MG CAPS Take 1 capsule by mouth every other day.     . Red Yeast Rice Extract (RED YEAST RICE PO) Take 1 tablet by mouth every other day.     . RESTASIS 0.05 % ophthalmic emulsion Place 2 drops into both eyes daily as needed. For dry eyes    . simvastatin (ZOCOR)  20 MG tablet Take 20 mg by mouth daily.    [name] Kitchen triamcinolone cream (KENALOG) 0.1 % Apply 1 application topically daily.      No current facility-administered medications for this visit.     Allergies  Fosamax [alendronate sodium]  Electrocardiogram:  12/3/ 2015 SR rate 63 normal ECG no change from 2014  05/25/16  NSR rate 79  Normal eCG   Assessment and Plan Dizzyness:  Non cardiac not postural BP and HR ok  F/u primary CAD:  Stent to LAD/Diagonal 2007  Normal myovue 2014  Stable with no angina and good activity level.  Continue medical Rx ChoL  Tolerating low dose zocor /fu labs primary  Thyroid  Continue  replacement TSH normal labs with primary  Shoulder:  Would need myovue to clear for surgery improved with PT and chiroprator TIA:  Non focal neuro exam for me.  Echo with bubble study negative  ECG totally normal Monitor unrevealing and patient defers LINQ impant at this time   [name]

## 2016-09-28 ENCOUNTER — Encounter: Payer: Self-pay | Admitting: Cardiovascular Disease

## 2016-09-28 ENCOUNTER — Ambulatory Visit (INDEPENDENT_AMBULATORY_CARE_PROVIDER_SITE_OTHER): Payer: Medicare Other | Admitting: Cardiovascular Disease

## 2016-09-28 VITALS — BP 92/54 | HR 77 | Ht 64.0 in | Wt 130.8 lb

## 2016-09-28 DIAGNOSIS — I2583 Coronary atherosclerosis due to lipid rich plaque: Secondary | ICD-10-CM | POA: Diagnosis not present

## 2016-09-28 DIAGNOSIS — I2510 Atherosclerotic heart disease of native coronary artery without angina pectoris: Secondary | ICD-10-CM | POA: Diagnosis not present

## 2016-09-28 NOTE — Patient Instructions (Addendum)

## 2016-10-19 ENCOUNTER — Ambulatory Visit
Admission: RE | Admit: 2016-10-19 | Discharge: 2016-10-19 | Disposition: A | Discharge status: Home / Self Care | Payer: Medicare Other | Source: Ambulatory Visit

## 2016-10-19 DIAGNOSIS — Z1231 Encounter for screening mammogram for malignant neoplasm of breast: Secondary | ICD-10-CM

## 2016-11-25 ENCOUNTER — Ambulatory Visit
Admission: RE | Admit: 2016-11-25 | Discharge: 2016-11-25 | Disposition: A | Discharge status: Home / Self Care | Payer: Medicare Other | Source: Ambulatory Visit | Attending: Internal Medicine | Admitting: Internal Medicine

## 2016-11-25 ENCOUNTER — Other Ambulatory Visit: Payer: Self-pay | Admitting: Internal Medicine

## 2016-11-25 DIAGNOSIS — I889 Nonspecific lymphadenitis, unspecified: Secondary | ICD-10-CM

## 2016-11-25 LAB — TSH: TSH: 0.48 (ref 0.41–5.90)

## 2016-11-25 LAB — POCT ERYTHROCYTE SEDIMENTATION RATE, NON-AUTOMATED: Sed Rate: 140

## 2016-11-25 LAB — CBC AND DIFFERENTIAL
HCT: 29 — AB (ref 36–46)
Hemoglobin: 9.3 — AB (ref 12.0–16.0)
Platelets: 968 — AB (ref 150–399)
WBC: 12.4

## 2016-11-26 ENCOUNTER — Telehealth: Payer: Self-pay | Admitting: Cardiovascular Disease

## 2016-11-26 NOTE — Telephone Encounter (Signed)
Left message for patient to call back  

## 2016-11-26 NOTE — Telephone Encounter (Signed)
New Message  Pt voiced wanting to know if she can take aspirin.  Please f/u with pt

## 2016-11-27 NOTE — Telephone Encounter (Signed)
I spoke with patient. She states she had colonoscopy last week and was told to stop her ASA.  I advised her they usually hold ASA to decrease bleeding risk during procedure.  I advised her it is still active on her medication list and she should contact the provider that did colonoscopy to be sure it is ok to resume ASA. She voiced understanding and agreed with plan.

## 2016-11-27 NOTE — Telephone Encounter (Signed)
Follow Up: ° ° ° °Returning Pam's call from yesterday. °

## 2016-12-02 ENCOUNTER — Ambulatory Visit (INDEPENDENT_AMBULATORY_CARE_PROVIDER_SITE_OTHER): Payer: Medicare Other | Admitting: Diagnostic Neuroimaging

## 2016-12-02 ENCOUNTER — Encounter: Payer: Self-pay | Admitting: Diagnostic Neuroimaging

## 2016-12-02 VITALS — BP 115/65 | HR 90 | Wt 125.6 lb

## 2016-12-02 DIAGNOSIS — I6330 Cerebral infarction due to thrombosis of unspecified cerebral artery: Secondary | ICD-10-CM

## 2016-12-02 DIAGNOSIS — I10 Essential (primary) hypertension: Secondary | ICD-10-CM | POA: Diagnosis not present

## 2016-12-02 DIAGNOSIS — R269 Unspecified abnormalities of gait and mobility: Secondary | ICD-10-CM | POA: Diagnosis not present

## 2016-12-02 DIAGNOSIS — R5383 Other fatigue: Secondary | ICD-10-CM

## 2016-12-02 NOTE — Progress Notes (Signed)
[facility]  PATIENT: Kim Lane DOB: [birth year]  REFERRING CLINICIAN:  HISTORY FROM: patient and daughter REASON FOR VISIT: follow up    HISTORICAL  CHIEF COMPLAINT:  Chief Complaint  Patient presents with  . Cerebrovascular Accident    rm 7  . Slurred speech  . Follow-up    6 month    HISTORY OF PRESENT ILLNESS:   UPDATE 12/02/16: Since last visit, stable except for weight loss and anemia. No new neuro symptoms. Some issues with fatigue, depression, getting confused with driving.   UPDATE 05/21/16: Since last visit, symptoms are improved. No new symptoms. MRI, MRA results reviewed. BP is improved. Balance is improved.   PRIOR HPI (04/20/16): 82 year old right-handed female here for evaluation of slurred speech and trouble talking. 04/06/16 patient had his new onset slurred speech and balance difficulty. 04/10/16 she had significant balance difficulty and elevated blood pressure of 180/120. Patient has been having trouble with word recall. Patient also having some trouble with swallowing as well as bilateral leg weakness. Patient feeling some additional problems in her left face and left foot. Patient had not been taking aspirin on a regular basis prior to this event. Patient went to PCP for evaluation and then referred to me for neurology consultation.   REVIEW OF SYSTEMS: Full 14 system review of systems performed and negative with exception of: insomnia memory loss fatigue decr appetite shortness of breath.     ALLERGIES: Allergies  Allergen Reactions  . Fosamax [Alendronate Sodium] Other (See Comments)    "made me ache all over"    HOME MEDICATIONS: Outpatient Medications Prior to Visit  Medication Sig Dispense Refill  . aspirin 81 MG tablet Take 81 mg by mouth daily.      [name] Kitchen co-enzyme Q-10 30 MG capsule Take 30 mg by mouth daily.     [name] Kitchen glucosamine-chondroitin 500-400 MG tablet Take 1 tablet by mouth daily as needed (for arthritis).     [name] Kitchen  levothyroxine (SYNTHROID, LEVOTHROID) 88 MCG tablet Take 88 mcg by mouth daily.    . metoprolol succinate (TOPROL-XL) 25 MG 24 hr tablet Take 12.5 mg by mouth every other day.     . Multiple Vitamins-Minerals (CENTRUM SILVER PO) Take 1 tablet by mouth daily.      . nitroGLYCERIN (NITROLINGUAL) 0.4 MG/SPRAY spray Place 1 spray under the tongue every 5 (five) minutes x 3 doses as needed for chest pain (3 sprays max).    . Omega-3 Fatty Acids (FISH OIL) 1000 MG CAPS Take 1 capsule by mouth every other day.     . Red Yeast Rice Extract (RED YEAST RICE PO) Take 1 tablet by mouth every other day.     . RESTASIS 0.05 % ophthalmic emulsion Place 2 drops into both eyes daily as needed. For dry eyes    . simvastatin (ZOCOR) 20 MG tablet Take 20 mg by mouth daily.    [name] Kitchen triamcinolone cream (KENALOG) 0.1 % Apply 1 application topically daily.      No facility-administered medications prior to visit.     PAST MEDICAL HISTORY: Past Medical History:  Diagnosis Date  . CAD (coronary artery disease) 02/2006   Taxus stent placed to LAD and Diagonal per Dr. Olevia Perches  . HTN (hypertension)   . Hypercholesterolemia   . Hypothyroidism   . IBS (irritable bowel syndrome)   . Osteoarthritis   . Stroke [facility]) 03/2016    PAST SURGICAL HISTORY: Past Surgical History:  Procedure Laterality Date  . APPENDECTOMY    .  stents     in heart  . TOTAL KNEE ARTHROPLASTY Bilateral 2011,2012  . VAGINAL HYSTERECTOMY      FAMILY HISTORY: Family History  Problem Relation Age of Onset  . Diabetes Mother   . Dementia Mother   . ALS Mother   . Heart Problems Father   . Liver disease Sister   . Cirrhosis Sister   . Heart block      CABG  . Diabetes Sister   . Heart block Son     CABG  . Other [place]  . Other Daughter     [place]  . Dementia Sister     SOCIAL HISTORY:  Social History   Social History  . Marital status: Widowed    Spouse name: N/A  . Number of children: 2  . Years of  education: 12   Occupational History  .  [employer]   Social History Main Topics  . Smoking status: Former Research scientist (life sciences)  . Smokeless tobacco: Never Used     Comment: quit smoking 30 years ago, very light smoker  . Alcohol use No  . Drug use: No  . Sexual activity: Not on file   Other Topics Concern  . Not on file   Social History Narrative   Lives alone   caffeine use- coffee- 5 cups daily     PHYSICAL EXAM  GENERAL EXAM/CONSTITUTIONAL: Vitals:  Vitals:   12/02/16 1403  BP: 115/65  Pulse: 90  Weight: 125 lb 9.6 oz (57 kg)   Body mass index is 21.56 kg/m. No exam data present  Patient is in no distress; well developed, nourished and groomed; neck is supple  CARDIOVASCULAR:  Examination of carotid arteries is normal; no carotid bruits  Regular rate and rhythm, no murmurs  Examination of peripheral vascular system by observation and palpation is normal  RADIAL PULSES SYMM; NO SUBCLAVIAN BRUITS  EYES:  Ophthalmoscopic exam of optic discs and posterior segments is normal; no papilledema or hemorrhages  MUSCULOSKELETAL:  Gait, strength, tone, movements noted in Neurologic exam below  NEUROLOGIC: MENTAL STATUS:  No flowsheet data found.  awake, alert, oriented to person, place and time  recent and remote memory intact  normal attention and concentration  language fluent, comprehension intact, naming intact,   fund of knowledge appropriate  CRANIAL NERVE:   2nd - no papilledema on fundoscopic exam  2nd, 3rd, 4th, 6th - pupils equal and reactive to light, visual fields full to confrontation, extraocular muscles intact, no nystagmus  5th - facial sensation symmetric  7th - facial strength symmetric  8th - hearing intact  9th - palate elevates symmetrically, uvula midline  11th - shoulder shrug symmetric  12th - tongue protrusion midline  MOTOR:   normal bulk and tone, full strength in the BUE, BLE  SENSORY:    normal and symmetric to light touch, temperature, vibration; EXCEPT DECR PP, TEMP AND VIB AT FEET  COORDINATION:   finger-nose-finger, fine finger movements normal  REFLEXES:   deep tendon reflexes --> BUE 2, KNEES 1, ANKLES 0  GAIT/STATION:   narrow based gait; ROMBERG NEG; ABLE TO TANDEM    DIAGNOSTIC DATA (LABS, IMAGING, TESTING) - I reviewed patient records, labs, notes, testing and imaging myself where available.  Lab Results  Component Value Date   WBC 7.2 05/30/2011   HGB 12.3 05/30/2011   HCT 36.6 05/30/2011   MCV 84.5 05/30/2011   PLT 383 05/30/2011  Component Value Date/Time   NA 140 09/14/2012 1205   K 4.3 09/14/2012 1205   CL 102 09/14/2012 1205   CO2 29 09/14/2012 1205   GLUCOSE 101 (H) 09/14/2012 1205   BUN 14 09/14/2012 1205   CREATININE 0.9 09/14/2012 1205   CALCIUM 9.0 09/14/2012 1205   PROT 7.2 09/14/2012 1205   ALBUMIN 4.1 09/14/2012 1205   AST 28 09/14/2012 1205   ALT 22 09/14/2012 1205   ALKPHOS 56 09/14/2012 1205   BILITOT 1.0 09/14/2012 1205   GFRNONAA >60 05/30/2011 1250   GFRAA >60 05/30/2011 1250   Lab Results  Component Value Date   CHOL 210 (H) 04/20/2016   HDL 58 04/20/2016   LDLCALC 133 (H) 04/20/2016   TRIG 97 04/20/2016   CHOLHDL 3.6 04/20/2016   Lab Results  Component Value Date   HGBA1C 6.4 (H) 04/20/2016   No results found for: VITAMINB12 No results found for: TSH  12/31/08 carotid u/s 1. There is slight calcific plaque formation at the carotid bifurcations bilaterally.  2. No hemodynamically significant stenosis is seen on either side.  3. There is antegrade flow in both vertebrals.   09/19/12 TTE  - Left ventricle: The cavity size was normal. Wall thickness was normal. Systolic function was normal. The estimated ejection fraction was in the range of 55% to 65%. Wall motion was normal; there were no regional wall motion abnormalities. Features are consistent with a pseudonormal left  ventricular filling pattern, with concomitant abnormal relaxation and increased filling pressure (grade 2 diastolic dysfunction). - Atrial septum: No defect or patent foramen ovale was identified.  11/22/14 EKG [I reviewed images myself and agree with interpretation. -VRP]  - normal sinus rhythm   04/13/16 CT head [I reviewed images myself and agree with interpretation. -VRP]  - Age-indeterminate white matter hypodensity within the right-greater-than-left cerebral hemispheres. Additionally there is suggestion of loss of gray-white differentiation within the anterior right frontal lobe. Findings may represent sequelae of subacute to chronic infarcts.  04/26/16 MRI of the brain without contrast [I reviewed images myself and agree with interpretation. -VRP]  1. Extensive T2/FLAIR hyperintense foci in both hemispheres, much more on the right than the left. There are large confluencies of the white matter foci on the right. The foci are most consistent with chronic ischemic change, more than expected for age 54. There are no acute findings.  04/26/16 MRI of the intracranial arteries shows the following: 1. There is a possible focal region of stenosis within one of the M2 segments on the left, though this could be artifactual as no region of definite stenosis was noted on the source images 2. The other arteries appear to have normal flow. 3. No aneurysms were detected.  04/26/16 MR angiogram of the neck vessels showing normal antegrade flow with no focal region of stenosis. The origin of the left vertebral artery is not well seen but this is likely to be artifactual as the study is otherwise normal.    ASSESSMENT AND PLAN  82 y.o. year old female here with new onset slurred speech, trouble walking, word recall difficulties and abnormal CT head suspicious for subacute ischemic infarcts. No acute-subacute findings on MRI. Has significant chronic small vessel ischemic disease.     Dx:   1. Cerebrovascular accident (CVA) due to thrombosis of cerebral artery ([place])   2. Essential hypertension, benign   3. Gait difficulty   4. Other fatigue      PLAN: - continue aspirin 81mg  daily - follow up  with PCP re: BP and lipid control and diabetes screening - stroke warning signs and education reviewed - brain healthy habits reviewed (nutrition, hydration, physical activity, mental stimulation, social stimulation)  Return if symptoms worsen or fail to improve, for return to PCP.    Penni Bombard, MD [id number], 0000000 PM Certified in Neurology, Neurophysiology and Neuroimaging  [facility] [address], [place], [place] [address] [phone]

## 2016-12-18 ENCOUNTER — Encounter (HOSPITAL_BASED_OUTPATIENT_CLINIC_OR_DEPARTMENT_OTHER): Payer: Self-pay | Admitting: *Deleted

## 2016-12-18 ENCOUNTER — Ambulatory Visit: Payer: Self-pay | Admitting: Otolaryngology

## 2016-12-18 NOTE — H&P (Signed)
PREOPERATIVE H&P  Chief Complaint: right neck lymphadenopathy for 2 months  HPI: Kim Lane is a 82 y.o. female who presents for evaluation of right neck lymphadenopathy for 2 months despite antibiotics. It has not been painful. She's taken to the OR for right neck node biopsy under local anesthesia.  Past Medical History:  Diagnosis Date  . CAD (coronary artery disease) 02/2006   Taxus stent placed to LAD and Diagonal per Dr. Olevia Perches  . HTN (hypertension)   . Hypercholesterolemia   . Hypothyroidism   . IBS (irritable bowel syndrome)   . Osteoarthritis   . Stroke [facility]) 03/2016   Past Surgical History:  Procedure Laterality Date  . APPENDECTOMY    . stents     in heart  . TOTAL KNEE ARTHROPLASTY Bilateral 2011,2012  . VAGINAL HYSTERECTOMY     Social History   Social History  . Marital status: Widowed    Spouse name: N/A  . Number of children: 2  . Years of education: 12   Occupational History  .  [employer]   Social History Main Topics  . Smoking status: Former Research scientist (life sciences)  . Smokeless tobacco: Never Used     Comment: quit smoking 30 years ago, very light smoker  . Alcohol use No  . Drug use: No  . Sexual activity: Not Asked   Other Topics Concern  . None   Social History Narrative   Lives alone   caffeine use- coffee- 5 cups daily   Family History  Problem Relation Age of Onset  . Diabetes Mother   . Dementia Mother   . ALS Mother   . Heart Problems Father   . Liver disease Sister   . Cirrhosis Sister   . Heart block      CABG  . Diabetes Sister   . Heart block Son     CABG  . Other [place]  . Other Daughter     [place]  . Dementia Sister    Allergies  Allergen Reactions  . Fosamax [Alendronate Sodium] Other (See Comments)    "made me ache all over"   Prior to Admission medications   Medication Sig Start Date End Date Taking? Authorizing Provider  amoxicillin (AMOXIL) 500 MG capsule 500 mg.  11/25/16   Historical Provider, MD  aspirin 81 MG tablet Take 81 mg by mouth daily.      Historical Provider, MD  co-enzyme Q-10 30 MG capsule Take 30 mg by mouth daily.     Historical Provider, MD  glucosamine-chondroitin 500-400 MG tablet Take 1 tablet by mouth daily as needed (for arthritis).     Historical Provider, MD  levothyroxine (SYNTHROID, LEVOTHROID) 88 MCG tablet Take 88 mcg by mouth daily. 11/04/15   Historical Provider, MD  metoprolol succinate (TOPROL-XL) 25 MG 24 hr tablet Take 12.5 mg by mouth every other day.     Historical Provider, MD  Multiple Vitamins-Minerals (CENTRUM SILVER PO) Take 1 tablet by mouth daily.      Historical Provider, MD  nitroGLYCERIN (NITROLINGUAL) 0.4 MG/SPRAY spray Place 1 spray under the tongue every 5 (five) minutes x 3 doses as needed for chest pain (3 sprays max).    Historical Provider, MD  Omega-3 Fatty Acids (FISH OIL) 1000 MG CAPS Take 1 capsule by mouth every other day.     Historical Provider, MD  Red Yeast Rice Extract (RED YEAST RICE PO) Take 1 tablet by mouth  every other day.     Historical Provider, MD  RESTASIS 0.05 % ophthalmic emulsion Place 2 drops into both eyes daily as needed. For dry eyes 09/19/15   Historical Provider, MD  simvastatin (ZOCOR) 20 MG tablet Take 20 mg by mouth daily. 09/08/16   Historical Provider, MD  triamcinolone cream (KENALOG) 0.1 % Apply 1 application topically daily.  05/19/16   Historical Provider, MD     Positive ROS: negative, no hoarseness of sore throat  All other systems have been reviewed and were otherwise negative with the exception of those mentioned in the HPI and as above.  Physical Exam: There were no vitals filed for this visit.  General: Alert, no acute distress Oral: Normal oral mucosa and tonsils Nasal: Clear nasal passages   FOL clear nasopharynx and normal VC mobility Neck: multiple right neck lymphadenopathy Ear: Ear canal is clear with normal appearing TMs Cardiovascular: Regular rate  and rhythm, no murmur.  Respiratory: Clear to auscultation Neurologic: Alert and oriented x 3   Assessment/Plan: right neck lymhandenopathy  Plan for Procedure(s): MINOR EXCISION OF MASS   Melony Overly, MD 12/18/2016 3:27 PM

## 2016-12-21 DIAGNOSIS — D759 Disease of blood and blood-forming organs, unspecified: Secondary | ICD-10-CM

## 2016-12-21 HISTORY — DX: Disease of blood and blood-forming organs, unspecified: D75.9

## 2016-12-22 ENCOUNTER — Encounter (HOSPITAL_BASED_OUTPATIENT_CLINIC_OR_DEPARTMENT_OTHER)
Admission: RE | Disposition: A | Discharge status: Home / Self Care | Payer: Self-pay | Source: Ambulatory Visit | Attending: Otolaryngology

## 2016-12-22 ENCOUNTER — Encounter (HOSPITAL_BASED_OUTPATIENT_CLINIC_OR_DEPARTMENT_OTHER): Payer: Self-pay

## 2016-12-22 ENCOUNTER — Ambulatory Visit (HOSPITAL_BASED_OUTPATIENT_CLINIC_OR_DEPARTMENT_OTHER)
Admission: RE | Admit: 2016-12-22 | Discharge: 2016-12-22 | Disposition: A | Discharge status: Home / Self Care | Payer: Medicare Other | Source: Ambulatory Visit | Attending: Otolaryngology | Admitting: Otolaryngology

## 2016-12-22 DIAGNOSIS — E7800 Pure hypercholesterolemia, unspecified: Secondary | ICD-10-CM | POA: Insufficient documentation

## 2016-12-22 DIAGNOSIS — Z8489 Family history of other specified conditions: Secondary | ICD-10-CM | POA: Insufficient documentation

## 2016-12-22 DIAGNOSIS — E039 Hypothyroidism, unspecified: Secondary | ICD-10-CM | POA: Insufficient documentation

## 2016-12-22 DIAGNOSIS — Z96653 Presence of artificial knee joint, bilateral: Secondary | ICD-10-CM | POA: Diagnosis not present

## 2016-12-22 DIAGNOSIS — I2510 Atherosclerotic heart disease of native coronary artery without angina pectoris: Secondary | ICD-10-CM | POA: Insufficient documentation

## 2016-12-22 DIAGNOSIS — Z8249 Family history of ischemic heart disease and other diseases of the circulatory system: Secondary | ICD-10-CM | POA: Diagnosis not present

## 2016-12-22 DIAGNOSIS — Z90710 Acquired absence of both cervix and uterus: Secondary | ICD-10-CM | POA: Diagnosis not present

## 2016-12-22 DIAGNOSIS — Z833 Family history of diabetes mellitus: Secondary | ICD-10-CM | POA: Diagnosis not present

## 2016-12-22 DIAGNOSIS — Z820 Family history of epilepsy and other diseases of the nervous system: Secondary | ICD-10-CM | POA: Insufficient documentation

## 2016-12-22 DIAGNOSIS — I10 Essential (primary) hypertension: Secondary | ICD-10-CM | POA: Diagnosis not present

## 2016-12-22 DIAGNOSIS — Z79899 Other long term (current) drug therapy: Secondary | ICD-10-CM | POA: Diagnosis not present

## 2016-12-22 DIAGNOSIS — Z8673 Personal history of transient ischemic attack (TIA), and cerebral infarction without residual deficits: Secondary | ICD-10-CM | POA: Diagnosis not present

## 2016-12-22 DIAGNOSIS — Z8379 Family history of other diseases of the digestive system: Secondary | ICD-10-CM | POA: Diagnosis not present

## 2016-12-22 DIAGNOSIS — Z888 Allergy status to other drugs, medicaments and biological substances status: Secondary | ICD-10-CM | POA: Insufficient documentation

## 2016-12-22 DIAGNOSIS — R590 Localized enlarged lymph nodes: Secondary | ICD-10-CM | POA: Diagnosis present

## 2016-12-22 DIAGNOSIS — Z7982 Long term (current) use of aspirin: Secondary | ICD-10-CM | POA: Insufficient documentation

## 2016-12-22 DIAGNOSIS — M1990 Unspecified osteoarthritis, unspecified site: Secondary | ICD-10-CM | POA: Diagnosis not present

## 2016-12-22 DIAGNOSIS — K589 Irritable bowel syndrome without diarrhea: Secondary | ICD-10-CM | POA: Insufficient documentation

## 2016-12-22 DIAGNOSIS — C8190 Hodgkin lymphoma, unspecified, unspecified site: Secondary | ICD-10-CM | POA: Insufficient documentation

## 2016-12-22 DIAGNOSIS — Z87891 Personal history of nicotine dependence: Secondary | ICD-10-CM | POA: Insufficient documentation

## 2016-12-22 HISTORY — PX: MASS EXCISION: SHX2000

## 2016-12-22 SURGERY — MINOR EXCISION OF MASS
Anesthesia: LOCAL | Site: Neck | Laterality: Right

## 2016-12-22 MED ORDER — BACITRACIN-NEOMYCIN-POLYMYXIN 400-5-5000 EX OINT
TOPICAL_OINTMENT | CUTANEOUS | Status: AC
  Filled 2016-12-22: qty 1

## 2016-12-22 MED ORDER — CHLORHEXIDINE GLUCONATE CLOTH 2 % EX PADS: 6.0000 | MEDICATED_PAD | Freq: Once | CUTANEOUS | Status: DC

## 2016-12-22 MED ORDER — LIDOCAINE-EPINEPHRINE (PF) 1.5 %-1:200000 IJ SOLN
INTRAMUSCULAR | Status: DC | PRN
  Administered 2016-12-22: 3 mL

## 2016-12-22 SURGICAL SUPPLY — 62 items
ADH SKN CLS APL DERMABOND .7 (GAUZE/BANDAGES/DRESSINGS) ×1
APL SKNCLS STERI-STRIP NONHPOA (GAUZE/BANDAGES/DRESSINGS)
BALL CTTN LRG ABS STRL LF (GAUZE/BANDAGES/DRESSINGS)
BANDAGE ADH SHEER 1  50/CT (GAUZE/BANDAGES/DRESSINGS) IMPLANT
BENZOIN TINCTURE PRP APPL 2/3 (GAUZE/BANDAGES/DRESSINGS) IMPLANT
BLADE SURG 15 STRL LF DISP TIS (BLADE) ×1 IMPLANT
BLADE SURG 15 STRL SS (BLADE) ×2
CANISTER SUCT 1200ML W/VALVE (MISCELLANEOUS) IMPLANT
CAUTERY EYE LOW TEMP 1300F FIN (OPHTHALMIC RELATED) IMPLANT
CLEANER CAUTERY TIP 5X5 PAD (MISCELLANEOUS) IMPLANT
COTTONBALL LRG STERILE PKG (GAUZE/BANDAGES/DRESSINGS) IMPLANT
COVER MAYO STAND STRL (DRAPES) ×1 IMPLANT
DECANTER SPIKE VIAL GLASS SM (MISCELLANEOUS) IMPLANT
DERMABOND ADVANCED (GAUZE/BANDAGES/DRESSINGS) ×1
DERMABOND ADVANCED .7 DNX12 (GAUZE/BANDAGES/DRESSINGS) IMPLANT
DRSG TEGADERM 4X4.75 (GAUZE/BANDAGES/DRESSINGS) IMPLANT
ELECT COATED BLADE 2.86 ST (ELECTRODE) ×2 IMPLANT
ELECT REM PT RETURN 9FT ADLT (ELECTROSURGICAL) ×2
ELECTRODE REM PT RTRN 9FT ADLT (ELECTROSURGICAL) ×1 IMPLANT
GAUZE SPONGE 4X4 16PLY XRAY LF (GAUZE/BANDAGES/DRESSINGS) IMPLANT
GLOVE BIOGEL PI IND STRL 7.0 (GLOVE) IMPLANT
GLOVE BIOGEL PI IND STRL 7.5 (GLOVE) IMPLANT
GLOVE BIOGEL PI INDICATOR 7.0 (GLOVE) ×1
GLOVE BIOGEL PI INDICATOR 7.5 (GLOVE) ×1
GLOVE SS BIOGEL STRL SZ 7.5 (GLOVE) ×1 IMPLANT
GLOVE SUPERSENSE BIOGEL SZ 7.5 (GLOVE) ×1
GLOVE SURG SYN 7.5  E (GLOVE) ×1
GLOVE SURG SYN 7.5 E (GLOVE) ×1 IMPLANT
GLOVE SURG SYN 7.5 PF PI (GLOVE) IMPLANT
GOWN STRL REUS W/ TWL LRG LVL3 (GOWN DISPOSABLE) IMPLANT
GOWN STRL REUS W/TWL LRG LVL3 (GOWN DISPOSABLE) ×2
MARKER SKIN DUAL TIP RULER LAB (MISCELLANEOUS) IMPLANT
NDL PRECISIONGLIDE 27X1.5 (NEEDLE) ×1 IMPLANT
NEEDLE PRECISIONGLIDE 27X1.5 (NEEDLE) ×2 IMPLANT
NS IRRIG 1000ML POUR BTL (IV SOLUTION) IMPLANT
PACK BASIN DAY SURGERY FS (CUSTOM PROCEDURE TRAY) ×1 IMPLANT
PAD CLEANER CAUTERY TIP 5X5 (MISCELLANEOUS)
PENCIL BUTTON HOLSTER BLD 10FT (ELECTRODE) ×2 IMPLANT
SPONGE GAUZE 4X4 12PLY STER LF (GAUZE/BANDAGES/DRESSINGS) IMPLANT
SPONGE INTESTINAL PEANUT (DISPOSABLE) IMPLANT
STRIP CLOSURE SKIN 1/2X4 (GAUZE/BANDAGES/DRESSINGS) IMPLANT
STRIP CLOSURE SKIN 1/4X4 (GAUZE/BANDAGES/DRESSINGS) IMPLANT
SUCTION FRAZIER HANDLE 10FR (MISCELLANEOUS)
SUCTION TUBE FRAZIER 10FR DISP (MISCELLANEOUS) IMPLANT
SUT CHROMIC 3 0 PS 2 (SUTURE) ×1 IMPLANT
SUT CHROMIC 4 0 P 3 18 (SUTURE) IMPLANT
SUT ETHILON 5 0 P 3 18 (SUTURE)
SUT ETHILON 6 0 P 1 (SUTURE) IMPLANT
SUT NYLON ETHILON 5-0 P-3 1X18 (SUTURE) IMPLANT
SUT PLAIN 5 0 P 3 18 (SUTURE) IMPLANT
SUT SILK 4 0 TIES 17X18 (SUTURE) IMPLANT
SUT VIC AB 4-0 P-3 18XBRD (SUTURE) IMPLANT
SUT VIC AB 4-0 P3 18 (SUTURE)
SWAB COLLECTION DEVICE MRSA (MISCELLANEOUS) IMPLANT
SWAB CULTURE ESWAB REG 1ML (MISCELLANEOUS) IMPLANT
SWABSTICK POVIDONE IODINE SNGL (MISCELLANEOUS) ×4 IMPLANT
SYR BULB 3OZ (MISCELLANEOUS) IMPLANT
SYR CONTROL 10ML LL (SYRINGE) ×2 IMPLANT
TOWEL OR 17X24 6PK STRL BLUE (TOWEL DISPOSABLE) ×2 IMPLANT
TRAY DSU PREP LF (CUSTOM PROCEDURE TRAY) ×1 IMPLANT
TUBE CONNECTING 20X1/4 (TUBING) IMPLANT
YANKAUER SUCT BULB TIP NO VENT (SUCTIONS) IMPLANT

## 2016-12-22 NOTE — Interval H&P Note (Signed)
History and Physical Interval Note:  12/22/2016 7:31 AM  Kim Lane  has presented today for surgery, with the diagnosis of right neck lymhandenopathy   The various methods of treatment have been discussed with the patient and family. After consideration of risks, benefits and other options for treatment, the patient has consented to  Procedure(s) with comments: MINOR EXCISION OF MASS (Right) - right neck lymph node bx  as a surgical intervention .  The patient's history has been reviewed, patient examined, no change in status, stable for surgery.  I have reviewed the patient's chart and labs.  Questions were answered to the patient's satisfaction.     [name]

## 2016-12-22 NOTE — Op Note (Signed)
NAMESALENE, CARDINAL               ACCOUNT NO.:  [id number]  MEDICAL RECORD NO.:  [id number]  LOCATION:                                FACILITY:  [facility]  PHYSICIAN:  Leonides Sake. Lucia Gaskins, M.D.DATE OF BIRTH:  1934/04/27  DATE OF PROCEDURE:  12/22/2016 DATE OF DISCHARGE:  12/22/2016                              OPERATIVE REPORT   PREOPERATIVE DIAGNOSIS:  Right neck lymphadenopathy.  POSTOPERATIVE DIAGNOSIS:  Right neck lymphadenopathy.  OPERATION PERFORMED:  Excisional biopsy of right lower neck node.  SURGEON:  Leonides Sake. Lucia Gaskins, MD.  ANESTHESIA:  Via local, 1.5% Xylocaine with epinephrine, 4 mL.  COMPLICATIONS:  None.  BRIEF CLINICAL NOTE:  Kim Lane is an 83 year old female, who has had persistent right neck lymphadenopathy for couple months now.  On exam, she has several firm, mobile palpable nodes in the right neck measuring approximately 1.5 to 2 cm in size.  She is taken to the operating room this time for excisional biopsy of a right neck node under local anesthetic.  DESCRIPTION OF PROCEDURE:  The patient was placed in the supine position with the head turned to the left.  She has several palpable, firm lymph nodes in the right neck.  One of these large lymph nodes was marked out and the area was injected with Xylocaine with epinephrine for local anesthetic.  Horizontal incision was made directly over the lymph node. Dissection was carried down through the subcutaneous tissue and platysma muscle.  The lymph node was just deep to the platysma muscle.  The nose was bluntly dissected out.  Cautery was used for hemostasis and the lymph node was excised with some surrounding fat.  Specimen was sent fresh in saline to pathology for lymphoma workup.  Hemostasis was obtained with cautery and the small defect was closed with 3-0 chromic sutures subcutaneously and Dermabond to reapproximate skin edges.  Claudette tolerated this well and is discharged home later this morning.   She is scheduled to see Oncology next week.          ______________________________ Leonides Sake. Lucia Gaskins, M.D.     CEN/MEDQ  D:  12/22/2016  T:  12/22/2016  Job:  [id number]

## 2016-12-22 NOTE — Discharge Instructions (Signed)
Take your regular meds Keep incision site dry for 24 hrs.  May then get it wet Call office for follow up appt in 1 week    [phone]

## 2016-12-22 NOTE — Brief Op Note (Signed)
12/22/2016  8:14 AM  PATIENT:  Kim Lane  83 y.o. female  PRE-OPERATIVE DIAGNOSIS:  right neck lymhandenopathy   POST-OPERATIVE DIAGNOSIS:  right neck lymhandenopathy   PROCEDURE:  Procedure(s): RIGHT NECK LYMPH NODE BIOPSY (Right)  SURGEON:  Surgeon(s) and Role:    * Rozetta Nunnery, MD - Primary  PHYSICIAN ASSISTANT:   ASSISTANTS: none   ANESTHESIA:   local  EBL:  No intake/output data recorded.  BLOOD ADMINISTERED:none  DRAINS: none   LOCAL MEDICATIONS USED:  XYLOCAINE 1 1/2 % with EPI  4 cc  SPECIMEN:  Source of Specimen:  right neck node  DISPOSITION OF SPECIMEN:  PATHOLOGY  COUNTS:  YES  TOURNIQUET:  * No tourniquets in log *  DICTATION: .Other Dictation: Dictation Number [id number]  PLAN OF CARE: Discharge to home after PACU  PATIENT DISPOSITION:  PACU - hemodynamically stable.   Delay start of Pharmacological VTE agent (>24hrs) due to surgical blood loss or risk of bleeding: not applicable

## 2016-12-23 ENCOUNTER — Encounter (HOSPITAL_BASED_OUTPATIENT_CLINIC_OR_DEPARTMENT_OTHER): Payer: Self-pay | Admitting: Otolaryngology

## 2016-12-24 ENCOUNTER — Encounter: Payer: Self-pay | Admitting: Hematology

## 2016-12-30 ENCOUNTER — Telehealth: Payer: Self-pay | Admitting: Hematology

## 2016-12-30 ENCOUNTER — Ambulatory Visit (HOSPITAL_BASED_OUTPATIENT_CLINIC_OR_DEPARTMENT_OTHER): Payer: Medicare Other | Admitting: Hematology

## 2016-12-30 ENCOUNTER — Other Ambulatory Visit (HOSPITAL_BASED_OUTPATIENT_CLINIC_OR_DEPARTMENT_OTHER): Payer: Medicare Other

## 2016-12-30 ENCOUNTER — Other Ambulatory Visit: Payer: Self-pay | Admitting: *Deleted

## 2016-12-30 ENCOUNTER — Encounter: Payer: Self-pay | Admitting: Hematology

## 2016-12-30 VITALS — BP 136/65 | HR 102 | Temp 98.2°F | Resp 17 | Ht 64.0 in | Wt 123.2 lb

## 2016-12-30 DIAGNOSIS — R590 Localized enlarged lymph nodes: Secondary | ICD-10-CM

## 2016-12-30 DIAGNOSIS — C8111 Nodular sclerosis classical Hodgkin lymphoma, lymph nodes of head, face, and neck: Secondary | ICD-10-CM

## 2016-12-30 DIAGNOSIS — D509 Iron deficiency anemia, unspecified: Secondary | ICD-10-CM

## 2016-12-30 DIAGNOSIS — D649 Anemia, unspecified: Secondary | ICD-10-CM

## 2016-12-30 LAB — COMPREHENSIVE METABOLIC PANEL
ALT: 16 U/L (ref 0–55)
AST: 19 U/L (ref 5–34)
Albumin: 2.5 g/dL — ABNORMAL LOW (ref 3.5–5.0)
Alkaline Phosphatase: 124 U/L (ref 40–150)
Anion Gap: 10 mEq/L (ref 3–11)
BUN: 14.2 mg/dL (ref 7.0–26.0)
CO2: 26 mEq/L (ref 22–29)
Calcium: 9.2 mg/dL (ref 8.4–10.4)
Chloride: 101 mEq/L (ref 98–109)
Creatinine: 1.1 mg/dL (ref 0.6–1.1)
EGFR: 50 mL/min/{1.73_m2} — ABNORMAL LOW (ref >90)
Glucose: 233 mg/dl — ABNORMAL HIGH (ref 70–140)
Potassium: 3.7 mEq/L (ref 3.5–5.1)
Sodium: 137 mEq/L (ref 136–145)
Total Bilirubin: 0.37 mg/dL (ref 0.20–1.20)
Total Protein: 7.9 g/dL (ref 6.4–8.3)

## 2016-12-30 LAB — CBC & DIFF AND RETIC
BASO%: 0.3 % (ref 0.0–2.0)
Basophils Absolute: 0 10*3/uL (ref 0.0–0.1)
EOS%: 2.4 % (ref 0.0–7.0)
Eosinophils Absolute: 0.3 10*3/uL (ref 0.0–0.5)
HCT: 29 % — ABNORMAL LOW (ref 34.8–46.6)
HGB: 9.1 g/dL — ABNORMAL LOW (ref 11.6–15.9)
Immature Retic Fract: 5.2 % (ref 1.60–10.00)
LYMPH%: 4.8 % — ABNORMAL LOW (ref 14.0–49.7)
MCH: 23.3 pg — ABNORMAL LOW (ref 25.1–34.0)
MCHC: 31.4 g/dL — ABNORMAL LOW (ref 31.5–36.0)
MCV: 74.2 fL — ABNORMAL LOW (ref 79.5–101.0)
MONO#: 1.2 10*3/uL — ABNORMAL HIGH (ref 0.1–0.9)
MONO%: 9.3 % (ref 0.0–14.0)
NEUT#: 10.3 10*3/uL — ABNORMAL HIGH (ref 1.5–6.5)
NEUT%: 83.2 % — ABNORMAL HIGH (ref 38.4–76.8)
Platelets: 695 10*3/uL — ABNORMAL HIGH (ref 145–400)
RBC: 3.91 10*6/uL (ref 3.70–5.45)
RDW: 20.3 % — ABNORMAL HIGH (ref 11.2–14.5)
Retic %: 1.27 % (ref 0.70–2.10)
Retic Ct Abs: 49.66 10*3/uL (ref 33.70–90.70)
WBC: 12.4 10*3/uL — ABNORMAL HIGH (ref 3.9–10.3)
lymph#: 0.6 10*3/uL — ABNORMAL LOW (ref 0.9–3.3)

## 2016-12-30 LAB — LACTATE DEHYDROGENASE: LDH: 185 U/L (ref 125–245)

## 2016-12-30 LAB — TECHNOLOGIST REVIEW

## 2016-12-30 NOTE — Progress Notes (Addendum)
[name]    HEMATOLOGY/ONCOLOGY CONSULTATION NOTE  Date of Service: 12/30/2016  Patient Care Team: Leeroy Cha, MD as PCP - General (Internal Medicine)  CHIEF COMPLAINTS/PURPOSE OF CONSULTATION:  Newly diagnosed Hodgkin's lymphoma  HISTORY OF PRESENTING ILLNESS:   Kim Lane is a wonderful 83 y.o. female who has been referred to [place] by Dr .Leeroy Cha, MD for evaluation and management of newly diagnosed Hodgkin's lymphoma.  Patient has a history of coronary artery disease status post PCI to LAD and diagonal in March 2007, hypertension, dyslipidemia, hypothyroidism, irritable bowel syndrome, suspected COPD, possible CVA in May 2017 (MRI showed chronic microvascular ischemic changes, no acute CVA - patient presented with slurred speech and confusion and facial droop which have resolved). Patient still lives in functions independently. Having some issues with balance but able to ambulate without a walker and cane at this time. No history of falls. She is accompanied by her son Konrad Dolores and daughter [name] for her visit today.  Patient notes that since about May/June 2017 she has had some increased fatigue, decreased appetite, weight loss of 15-20 pounds, intermittent significant night sweats. Has felt chilly but no objective fevers. Has had intermittent skin rash over her trunk area and extremities which have been treated with topical steroids and short burst of oral prednisone by her dermatologist. She reports that she was also told by her doctor should she is getting somewhat more anemic.  In November/December 2017 she noted increased size of right lower neck lymph nodes which were evaluated by her primary care physician. She was empirically treated with amoxicillin with no resolution of her lymph nodes. She was referred for a lymph node biopsy and eventually had her right supraclavicular lymph node biopsy on 12/22/2016.  The results of her lymph node biopsy were consistent with  classical Hodgkin's lymphoma nodular sclerosis type.  She was referred to [place] for further evaluation and management. She has noted some other right neck lymph nodes but does not note any other overt lymphadenopathy. Notes that she feels fatigued and overall other unwell since this started in hopes we can help her. No staging imaging done yet. She was not aware of her diagnosis prior to the clinic visit today. I discussed her diagnosis, planned workup, broadly possible treatment options and other details of her condition and provided reading material to her and her accompanying daughter and son.  Patient still drives and lives independently. She had an echocardiogram in May 2017 with her cardiologist Dr. [name] which showed normal ejection fraction of 60-65% with mild left ventricular hypertrophy and no significant valvular abnormalities. She has had a few episodes of angina that have been treated with nitroglycerin.  She reports that she recently had a chest x-ray for bronchitis like symptoms on 11/25/2016 that showed somewhat hyperinflated lungs with subsegmental ectasis. No acute pneumonia.  She notes that she only smoked for about a year long time ago and had significant exposure to secondhand smoking from husband.  Currently still has a reddish non-raised rash over her back which is quite pruritic.  MEDICAL HISTORY:  Past Medical History:  Diagnosis Date  . CAD (coronary artery disease) 02/2006   Taxus stent placed to LAD and Diagonal per Dr. Olevia Perches  . HTN (hypertension)   . Hypercholesterolemia   . Hypothyroidism   . IBS (irritable bowel syndrome)   . Osteoarthritis   . Stroke [facility]) 03/2016  Osteoporosis Cardiologist Dr. [name] GI -Dr. Thana Farr  SURGICAL HISTORY: Past Surgical History:  Procedure Laterality Date  .  APPENDECTOMY    . MASS EXCISION Right 12/22/2016   Procedure: RIGHT NECK LYMPH NODE BIOPSY;  Surgeon: Rozetta Nunnery, MD;  Location: [place];  Service: ENT;  Laterality: Right;  . stents     in heart  . TOTAL KNEE ARTHROPLASTY Bilateral 2011,2012  . VAGINAL HYSTERECTOMY      SOCIAL HISTORY: Social History   Social History  . Marital status: Widowed    Spouse name: N/A  . Number of children: 2  . Years of education: 12   Occupational History  .  [employer]   Social History Main Topics  . Smoking status: Former Research scientist (life sciences)  . Smokeless tobacco: Never Used     Comment: quit smoking 30 years ago, very light smoker  . Alcohol use No  . Drug use: No  . Sexual activity: Not on file   Other Topics Concern  . Not on file   Social History Narrative   Lives alone   caffeine use- coffee- 5 cups daily    FAMILY HISTORY: Family History  Problem Relation Age of Onset  . Diabetes Mother   . Dementia Mother   . ALS Mother   . Heart Problems Father   . Liver disease Sister   . Cirrhosis Sister   . Heart block      CABG  . Diabetes Sister   . Heart block Son     CABG  . Other [place]  . Other Daughter     [place]  . Dementia Sister     ALLERGIES:  is allergic to fosamax [alendronate sodium].  MEDICATIONS:  Current Outpatient Prescriptions  Medication Sig Dispense Refill  . amoxicillin (AMOXIL) 500 MG capsule 500 mg.    . aspirin 81 MG tablet Take 81 mg by mouth daily.      [name] Kitchen co-enzyme Q-10 30 MG capsule Take 30 mg by mouth daily.     [name] Kitchen glucosamine-chondroitin 500-400 MG tablet Take 1 tablet by mouth daily as needed (for arthritis).     [name] Kitchen levothyroxine (SYNTHROID, LEVOTHROID) 88 MCG tablet Take 88 mcg by mouth daily.    . metoprolol succinate (TOPROL-XL) 25 MG 24 hr tablet Take 12.5 mg by mouth every other day.     . Multiple Vitamins-Minerals (CENTRUM SILVER PO) Take 1 tablet by mouth daily.      . nitroGLYCERIN (NITROLINGUAL) 0.4 MG/SPRAY spray Place 1 spray under the tongue every 5 (five) minutes x 3 doses as needed for chest pain (3 sprays max).     . Omega-3 Fatty Acids (FISH OIL) 1000 MG CAPS Take 1 capsule by mouth every other day.     [name] Kitchen PROAIR HFA 108 (90 Base) MCG/ACT inhaler     . RESTASIS 0.05 % ophthalmic emulsion Place 2 drops into both eyes daily as needed. For dry eyes    . simvastatin (ZOCOR) 20 MG tablet Take 20 mg by mouth daily.    [name] Kitchen triamcinolone cream (KENALOG) 0.1 % Apply 1 application topically daily.      No current facility-administered medications for this visit.     REVIEW OF SYSTEMS:    10 Point review of Systems was done is negative except as noted above.  PHYSICAL EXAMINATION: ECOG PERFORMANCE STATUS: 2 - Symptomatic, <50% confined to bed  . Vitals:   12/30/16 1525  BP: 136/65  Pulse: (!) 102  Resp: 17  Temp: 98.2 F (36.8 C)   Filed  Weights   12/30/16 1525  Weight: 123 lb 3.2 oz (55.9 kg)   .Body mass index is 21.15 kg/m.  GENERAL:alert, in no acute distress and comfortable SKIN: skin color, texture, turgor are normal, no rashes or significant lesions EYES: normal, conjunctiva are pink and non-injected, sclera clear OROPHARYNX:no exudate, no erythema and lips, buccal mucosa, and tongue normal  NECK: supple, no JVD, thyroid normal size, non-tender, without nodularity LYMPH:  no palpable lymphadenopathy in the cervical, axillary or inguinal LUNGS: clear to auscultation with normal respiratory effort HEART: regular rate & rhythm,  no murmurs and no lower extrety ed[place]ami ABDOMEN: abdomen soft, non-tender, normoactive bowel sounds  Musculoskeletal: no cyanosis of digits and no clubbing  PSYCH: alert & oriented x 3 with fluent speech NEURO: no focal motor/sensory deficits  LABORATORY DATA:  I have reviewed the data as listed  . CBC Latest Ref Rng & Units 12/30/2016 05/30/2011 01/01/2011  WBC 3.9 - 10.3 10e3/uL 12.4(H) 7.2 10.5  Hemoglobin 11.6 - 15.9 g/dL 9.1(L) 12.3 8.7(L)  Hematocrit 34.8 - 46.6 % 29.0(L) 36.6 26.6(L)  Platelets 145 - 400 10e3/uL 695(H) 383 313   . CBC    Component  Value Date/Time   WBC 12.4 (H) 12/30/2016 1507   WBC 7.2 05/30/2011 1250   RBC 3.91 12/30/2016 1507   RBC 4.33 05/30/2011 1250   HGB 9.1 (L) 12/30/2016 1507   HCT 29.0 (L) 12/30/2016 1507   PLT 695 (H) 12/30/2016 1507   MCV 74.2 (L) 12/30/2016 1507   MCH 23.3 (L) 12/30/2016 1507   MCH 28.4 05/30/2011 1250   MCHC 31.4 (L) 12/30/2016 1507   MCHC 33.6 05/30/2011 1250   RDW 20.3 (H) 12/30/2016 1507   LYMPHSABS 0.6 (L) 12/30/2016 1507   MONOABS 1.2 (H) 12/30/2016 1507   EOSABS 0.3 12/30/2016 1507   BASOSABS 0.0 12/30/2016 1507    . CMP Latest Ref Rng & Units 12/30/2016 09/14/2012 05/30/2011  Glucose 70 - 140 mg/dl 233(H) 101(H) 108(H)  BUN 7.0 - 26.0 mg/dL 14.2 14 16   Creatinine 0.6 - 1.1 mg/dL 1.1 0.9 0.76  Sodium 136 - 145 mEq/L 137 140 139  Potassium 3.5 - 5.1 mEq/L 3.7 4.3 4.3  Chloride 96 - 112 mEq/L - 102 102  CO2 22 - 29 mEq/L 26 29 30   Calcium 8.4 - 10.4 mg/dL 9.2 9.0 8.9  Total Protein 6.4 - 8.3 g/dL 7.9 7.2 6.6  Total Bilirubin 0.20 - 1.20 mg/dL 0.37 1.0 0.5  Alkaline Phos 40 - 150 U/L 124 56 61  AST 5 - 34 U/L 19 28 20   ALT 0 - 55 U/L 16 22 13    Component     Latest Ref Rng & Units 12/30/2016  LDH     125 - 245 U/L 185  Sed Rate     0 - 40 mm/hr 68 (H)  Hepatitis B Surface Ag     Negative Negative      RADIOGRAPHIC STUDIES: I have personally reviewed the radiological images as listed and agreed with the findings in the report. No results found. Transthoracic Echocardiography  Patient:    Emahni, Meinhart MR #:       [id number] Study Date: 06/11/2016 Gender:     F Age:        83 Height:     162.6 cm Weight:     62 kg BSA:        1.68 m^2 Pt. Status: Room:   Jetty Duhamel, M.D.  REFERRING  [name], M.D.  ATTENDING    Loralie Champagne, M.D.  PERFORMING   [facility], Outpatient  SONOGRAPHER  Bethany McMahill, RDCS  cc:  ------------------------------------------------------------------- LV EF: 60% -    65%  ------------------------------------------------------------------- Indications:      TIA (G45.9).  ------------------------------------------------------------------- History:   PMH:   Coronary artery disease.  Stroke.  Risk factors: Family history of coronary artery disease. Former tobacco use. Hypertension. Dyslipidemia.  ------------------------------------------------------------------- Study Conclusions  - Left ventricle: The cavity size was normal. Wall thickness was   increased in a pattern of mild LVH. Systolic function was normal.   The estimated ejection fraction was in the range of 60% to 65%.   Wall motion was normal; there were no regional wall motion   abnormalities. Doppler parameters are consistent with abnormal   left ventricular relaxation (grade 1 diastolic dysfunction).   Normal strain pattern, GLS -22.7%. - Aortic valve: There was no stenosis. - Mitral valve: Mildly calcified annulus. There was no significant   regurgitation. - Left atrium: The atrium was mildly dilated. - Right ventricle: The cavity size was normal. Systolic function   was normal. - Pulmonary arteries: PA peak pressure: 19 mm Hg (S). - Inferior vena cava: The vessel was normal in size. The   respirophasic diameter changes were in the normal range (= 50%),   consistent with normal central venous pressure.  Impressions:  - Normal LV size and systolic function, EF [id number]. Mild LV   hypertrophy. Normal RV size and systolic function. No significant   valvular abnormalities.   ASSESSMENT & PLAN:   83 year old Caucasian female with ECOG performance status of 2 with  1) Newly diagnosed classical Hodgkin's lymphoma of nodular sclerosis variety  Presented with right neck lymph nodes . She has diabetes constitutional symptoms with 15-20 pound weight loss night sweats or chills  Also noted to have significant pruritus with mild rash likely from Hodgkin's  2) Microcytic anemia -  likely anemia of chronic disease due to Hodgkin's lymphoma .  We'll rule out iron deficiency though less likely PLAN -The diagnosis of classical Hodgkin's lymphoma, its significance, workup recommendations and broad treatment considerations were discussed in details . We discussed that this is generally considered potentially curable disorder however the patient's age would be an adverse prognostic factor . Her age and other medical comorbidities would also serve as potential limiting factors with regards to treatment options . -Patient daughter and son had multiple questions which were answered in details. -They are given reading material about Hodgkin's lymphoma  -PET/CT scan for initial staging of her Hodgkin's lymphoma as soon as possible. -She has had an echo in the last 6 months in May 2017 which showed normal ejection fraction. -Will get pulmonary function testing. -Labs reviewed elevated sedimentation rate. Hepatitis B sAg neg -Ongoing goals of care discussion. -We will likely need additional help at home depending on treatment plan chosen.  3) . Patient Active Problem List   Diagnosis Date Noted  . Dyspnea 09/14/2012  . Epistaxis 06/02/2011  . HYPERCHOLESTEROLEMIA, MIXED 01/08/2009  . ESSENTIAL HYPERTENSION, BENIGN 01/08/2009  . Coronary atherosclerosis 01/08/2009  . HYPOTHYROIDISM, HX OF 01/08/2009  PLAN -Continue follow-up with primary care physician for management of other chronic medical co-morbidities  PET/CT in 3-5 days PFTs within 1 week RTC with Dr [name] in 2 days after PET/CT scan with labs    All of the patients questions were answered with apparent satisfaction. The patient knows to call the clinic with any problems, questions or concerns.  I spent 60 minutes counseling the patient face to face. The total time spent in the appointment was 80 minutes and more than 50% was on counseling and direct patient cares.    Sullivan Lone MD MS AAHIVMS [facility]  [facility] Hematology/Oncology Physician [facility]  (Office):       [phone] (Work cell):  [phone] (Fax):           [phone]  12/30/2016 3:43 PM   Addendum  Awaiting PET/CT Her case was discussed in tumor board. Will plan to treat with AVD (without bleomycin) with neulasta support (given her age and limited bone marrow reserve). Number of cycles based on staging of her CHL on PET/CT scan. Thanks, GK

## 2016-12-30 NOTE — Telephone Encounter (Signed)
GAVE PATIENT AVS REPORT AND APPOINTMENTS FOR January. CENTRAL RADIOLOGY WILL CALL RE SCAN AND CARDIOPULMONARY WILL CALL RE PFT. LEFT MESSAGE FOR CARDIOPULMONARY - ORDER IN EPIC - OFFICE WILL SCHEDULE FROM [facility] WHEN ORDERS PRESENT.

## 2016-12-31 LAB — APTT: aPTT: 32 s (ref 24–33)

## 2016-12-31 LAB — HEPATITIS B SURFACE ANTIGEN: HBsAg Screen: NEGATIVE

## 2016-12-31 LAB — PROTHROMBIN TIME (PT)
INR: 1.2 (ref 0.8–1.2)
Prothrombin Time: 12.2 s — ABNORMAL HIGH (ref 9.1–12.0)

## 2016-12-31 LAB — SEDIMENTATION RATE: Sedimentation Rate-Westergren: 68 mm/hr — ABNORMAL HIGH (ref 0–40)

## 2017-01-04 ENCOUNTER — Other Ambulatory Visit: Payer: Self-pay | Admitting: *Deleted

## 2017-01-07 ENCOUNTER — Telehealth: Payer: Self-pay | Admitting: Hematology

## 2017-01-07 NOTE — Telephone Encounter (Signed)
Patient called to reschedule her appointment on 1/22 to the following week. Patient had a pet scan that was reschedule for  1/25 and she needs to see Dr [name] after the scan is done

## 2017-01-08 ENCOUNTER — Ambulatory Visit (HOSPITAL_COMMUNITY)
Admission: RE | Admit: 2017-01-08 | Discharge: 2017-01-08 | Disposition: A | Discharge status: Home / Self Care | Payer: Medicare Other | Source: Ambulatory Visit | Attending: Hematology | Admitting: Hematology

## 2017-01-08 DIAGNOSIS — C8111 Nodular sclerosis classical Hodgkin lymphoma, lymph nodes of head, face, and neck: Secondary | ICD-10-CM

## 2017-01-08 LAB — PULMONARY FUNCTION TEST
DL/VA % pred: 82 %
DL/VA: 3.95 ml/min/mmHg/L
DLCO cor % pred: 66 %
DLCO cor: 16.03 ml/min/mmHg
DLCO unc % pred: 55 %
DLCO unc: 13.42 ml/min/mmHg
FEF 25-75 Post: 1.21 L/sec
FEF 25-75 Pre: 0.98 L/sec
FEF2575-%Change-Post: 23 %
FEF2575-%Pred-Post: 87 %
FEF2575-%Pred-Pre: 71 %
FEV1-%Change-Post: 7 %
FEV1-%Pred-Post: 80 %
FEV1-%Pred-Pre: 74 %
FEV1-Post: 1.55 L
FEV1-Pre: 1.44 L
FEV1FVC-%Change-Post: -2 %
FEV1FVC-%Pred-Pre: 94 %
FEV6-%Change-Post: 10 %
FEV6-%Pred-Post: 91 %
FEV6-%Pred-Pre: 82 %
FEV6-Post: 2.24 L
FEV6-Pre: 2.02 L
FEV6FVC-%Change-Post: 0 %
FEV6FVC-%Pred-Post: 104 %
FEV6FVC-%Pred-Pre: 104 %
FVC-%Change-Post: 10 %
FVC-%Pred-Post: 87 %
FVC-%Pred-Pre: 79 %
FVC-Post: 2.26 L
FVC-Pre: 2.06 L
Post FEV1/FVC ratio: 69 %
Post FEV6/FVC ratio: 99 %
Pre FEV1/FVC ratio: 70 %
Pre FEV6/FVC Ratio: 99 %
RV % pred: 168 %
RV: 4.06 L
TLC % pred: 122 %
TLC: 6.18 L

## 2017-01-08 MED ORDER — ALBUTEROL SULFATE (2.5 MG/3ML) 0.083% IN NEBU
2.5000 mg | INHALATION_SOLUTION | Freq: Once | RESPIRATORY_TRACT | Status: AC
  Administered 2017-01-08: 2.5 mg via RESPIRATORY_TRACT

## 2017-01-08 NOTE — Progress Notes (Signed)
6 minute walk ordered with PFT, but pt unable to do. I did not want to do the walk before the PFT because I didn't want her to be tired & the PFT not be accurate. Following PFT pt was very tired & worn out & unable to do 6 minute walk.  Kathie Dike RRT

## 2017-01-11 ENCOUNTER — Ambulatory Visit: Payer: Medicare Other | Admitting: Hematology

## 2017-01-13 DIAGNOSIS — C8111 Nodular sclerosis classical Hodgkin lymphoma, lymph nodes of head, face, and neck: Secondary | ICD-10-CM | POA: Insufficient documentation

## 2017-01-13 NOTE — Addendum Note (Signed)
Addended by: Sullivan Lone on: 01/13/2017 11:59 PM   Modules accepted: Orders

## 2017-01-14 ENCOUNTER — Ambulatory Visit
Admission: RE | Admit: 2017-01-14 | Discharge: 2017-01-14 | Disposition: A | Discharge status: Home / Self Care | Payer: Medicare Other | Source: Ambulatory Visit | Attending: Hematology | Admitting: Hematology

## 2017-01-14 DIAGNOSIS — I700 Atherosclerosis of aorta: Secondary | ICD-10-CM | POA: Diagnosis not present

## 2017-01-14 DIAGNOSIS — R918 Other nonspecific abnormal finding of lung field: Secondary | ICD-10-CM | POA: Diagnosis not present

## 2017-01-14 DIAGNOSIS — I708 Atherosclerosis of other arteries: Secondary | ICD-10-CM | POA: Insufficient documentation

## 2017-01-14 DIAGNOSIS — I2510 Atherosclerotic heart disease of native coronary artery without angina pectoris: Secondary | ICD-10-CM | POA: Insufficient documentation

## 2017-01-14 DIAGNOSIS — D649 Anemia, unspecified: Secondary | ICD-10-CM | POA: Insufficient documentation

## 2017-01-14 DIAGNOSIS — C8111 Nodular sclerosis classical Hodgkin lymphoma, lymph nodes of head, face, and neck: Secondary | ICD-10-CM | POA: Insufficient documentation

## 2017-01-14 DIAGNOSIS — R590 Localized enlarged lymph nodes: Secondary | ICD-10-CM | POA: Diagnosis not present

## 2017-01-14 LAB — GLUCOSE, CAPILLARY: Glucose-Capillary: 99 mg/dL (ref 65–99)

## 2017-01-14 MED ORDER — FLUDEOXYGLUCOSE F - 18 (FDG) INJECTION
13.0000 | Freq: Once | INTRAVENOUS | Status: AC | PRN
  Administered 2017-01-14: 12.72 via INTRAVENOUS

## 2017-01-18 ENCOUNTER — Encounter: Payer: Self-pay | Admitting: Hematology

## 2017-01-18 ENCOUNTER — Ambulatory Visit (HOSPITAL_BASED_OUTPATIENT_CLINIC_OR_DEPARTMENT_OTHER): Payer: Medicare Other | Admitting: Hematology

## 2017-01-18 VITALS — BP 137/78 | HR 82 | Temp 98.9°F | Resp 18 | Ht 64.0 in | Wt 120.7 lb

## 2017-01-18 DIAGNOSIS — D509 Iron deficiency anemia, unspecified: Secondary | ICD-10-CM | POA: Diagnosis not present

## 2017-01-18 DIAGNOSIS — C8111 Nodular sclerosis classical Hodgkin lymphoma, lymph nodes of head, face, and neck: Secondary | ICD-10-CM | POA: Diagnosis not present

## 2017-01-18 MED ORDER — POLYSACCHARIDE IRON COMPLEX 150 MG PO CAPS: 150.0000 mg | ORAL_CAPSULE | Freq: Every day | ORAL | 1 refills | Status: DC

## 2017-01-18 MED ORDER — DEXAMETHASONE 4 MG PO TABS: 4.0000 mg | ORAL_TABLET | Freq: Every day | ORAL | 0 refills | Status: DC

## 2017-01-19 ENCOUNTER — Telehealth: Payer: Self-pay | Admitting: Hematology

## 2017-01-19 MED ORDER — DEXAMETHASONE 4 MG PO TABS: ORAL_TABLET | ORAL | 1 refills | Status: DC

## 2017-01-19 MED ORDER — LIDOCAINE-PRILOCAINE 2.5-2.5 % EX CREA: TOPICAL_CREAM | CUTANEOUS | 3 refills | Status: DC

## 2017-01-19 MED ORDER — PROCHLORPERAZINE MALEATE 10 MG PO TABS: 10.0000 mg | ORAL_TABLET | Freq: Four times a day (QID) | ORAL | 1 refills | Status: DC | PRN

## 2017-01-19 MED ORDER — ONDANSETRON HCL 8 MG PO TABS: 8.0000 mg | ORAL_TABLET | Freq: Two times a day (BID) | ORAL | 1 refills | Status: DC | PRN

## 2017-01-19 NOTE — Telephone Encounter (Signed)
sw pt to confirm 2/2 chemo class date/time. Pt will call back to r/s if dtr has a conflict with appt date/time

## 2017-01-22 ENCOUNTER — Other Ambulatory Visit: Payer: Self-pay | Admitting: Physician Assistant

## 2017-01-22 ENCOUNTER — Other Ambulatory Visit: Payer: Medicare Other

## 2017-01-24 NOTE — Progress Notes (Signed)
[name] Kitchen    HEMATOLOGY/ONCOLOGY CLINIC NOTE  Date of Service: 01/24/2017  Patient Care Team: Leeroy Cha, MD as PCP - General (Internal Medicine)  CHIEF COMPLAINTS/PURPOSE OF CONSULTATION:  Newly diagnosed Hodgkin's lymphoma  HISTORY OF PRESENTING ILLNESS:   Kim SPRAYBERRY is a wonderful 83 y.o. female who has been referred to [place] by Dr .Leeroy Cha, MD for evaluation and management of newly diagnosed Hodgkin's lymphoma.  Patient has a history of coronary artery disease status post PCI to LAD and diagonal in March 2007, hypertension, dyslipidemia, hypothyroidism, irritable bowel syndrome, suspected COPD, possible CVA in May 2017 (MRI showed chronic microvascular ischemic changes, no acute CVA - patient presented with slurred speech and confusion and facial droop which have resolved). Patient still lives in functions independently. Having some issues with balance but able to ambulate without a walker and cane at this time. No history of falls. She is accompanied by her son Konrad Dolores and daughter [name] for her visit today.  Patient notes that since about May/June 2017 she has had some increased fatigue, decreased appetite, weight loss of 15-20 pounds, intermittent significant night sweats. Has felt chilly but no objective fevers. Has had intermittent skin rash over her trunk area and extremities which have been treated with topical steroids and short burst of oral prednisone by her dermatologist. She reports that she was also told by her doctor should she is getting somewhat more anemic.  In November/December 2017 she noted increased size of right lower neck lymph nodes which were evaluated by her primary care physician. She was empirically treated with amoxicillin with no resolution of her lymph nodes. She was referred for a lymph node biopsy and eventually had her right supraclavicular lymph node biopsy on 12/22/2016.  The results of her lymph node biopsy were consistent with classical  Hodgkin's lymphoma nodular sclerosis type.  She was referred to [place] for further evaluation and management. She has noted some other right neck lymph nodes but does not note any other overt lymphadenopathy. Notes that she feels fatigued and overall other unwell since this started in hopes we can help her. No staging imaging done yet. She was not aware of her diagnosis prior to the clinic visit today. I discussed her diagnosis, planned workup, broadly possible treatment options and other details of her condition and provided reading material to her and her accompanying daughter and son.  Patient still drives and lives independently. She had an echocardiogram in May 2017 with her cardiologist Dr. [name] which showed normal ejection fraction of 60-65% with mild left ventricular hypertrophy and no significant valvular abnormalities. She has had a few episodes of angina that have been treated with nitroglycerin.  She reports that she recently had a chest x-ray for bronchitis like symptoms on 11/25/2016 that showed somewhat hyperinflated lungs with subsegmental ectasis. No acute pneumonia.  She notes that she only smoked for about a year long time ago and had significant exposure to secondhand smoking from husband.  Currently still has a reddish non-raised rash over her back which is quite pruritic.  INTERVAL HISTORY  Patient is here for f/u after staging workup for newly diagnosed Classical Hodgkins lymphoma. PEt/CT results. pfts and labs reviewed in details. PET/CT images reviewed.  MEDICAL HISTORY:  Past Medical History:  Diagnosis Date  . CAD (coronary artery disease) 02/2006   Taxus stent placed to LAD and Diagonal per Dr. Olevia Perches  . HTN (hypertension)   . Hypercholesterolemia   . Hypothyroidism   . IBS (irritable bowel syndrome)   .  Osteoarthritis   . Stroke [facility]) 03/2016  Osteoporosis Cardiologist Dr. [name] GI -Dr. Thana Farr  SURGICAL HISTORY: Past Surgical History:    Procedure Laterality Date  . APPENDECTOMY    . MASS EXCISION Right 12/22/2016   Procedure: RIGHT NECK LYMPH NODE BIOPSY;  Surgeon: Rozetta Nunnery, MD;  Location: [place];  Service: ENT;  Laterality: Right;  . stents     in heart  . TOTAL KNEE ARTHROPLASTY Bilateral 2011,2012  . VAGINAL HYSTERECTOMY      SOCIAL HISTORY: Social History   Social History  . Marital status: Widowed    Spouse name: N/A  . Number of children: 2  . Years of education: 12   Occupational History  .  [employer]   Social History Main Topics  . Smoking status: Former Research scientist (life sciences)  . Smokeless tobacco: Never Used     Comment: quit smoking 30 years ago, very light smoker  . Alcohol use No  . Drug use: No  . Sexual activity: Not on file   Other Topics Concern  . Not on file   Social History Narrative   Lives alone   caffeine use- coffee- 5 cups daily    FAMILY HISTORY: Family History  Problem Relation Age of Onset  . Diabetes Mother   . Dementia Mother   . ALS Mother   . Heart Problems Father   . Liver disease Sister   . Cirrhosis Sister   . Heart block      CABG  . Diabetes Sister   . Heart block Son     CABG  . Other [place]  . Other Daughter     [place]  . Dementia Sister     ALLERGIES:  is allergic to fosamax [alendronate sodium].  MEDICATIONS:  Current Outpatient Prescriptions  Medication Sig Dispense Refill  . amoxicillin (AMOXIL) 500 MG capsule 500 mg.    . aspirin 81 MG tablet Take 81 mg by mouth daily.      [name] Kitchen co-enzyme Q-10 30 MG capsule Take 30 mg by mouth daily.     [name] Kitchen glucosamine-chondroitin 500-400 MG tablet Take 1 tablet by mouth daily as needed (for arthritis).     [name] Kitchen levothyroxine (SYNTHROID, LEVOTHROID) 88 MCG tablet Take 88 mcg by mouth daily.    . metoprolol succinate (TOPROL-XL) 25 MG 24 hr tablet Take 12.5 mg by mouth every other day.     . Multiple Vitamins-Minerals (CENTRUM SILVER PO) Take 1  tablet by mouth daily.      . nitroGLYCERIN (NITROLINGUAL) 0.4 MG/SPRAY spray Place 1 spray under the tongue every 5 (five) minutes x 3 doses as needed for chest pain (3 sprays max).    . Omega-3 Fatty Acids (FISH OIL) 1000 MG CAPS Take 1 capsule by mouth every other day.     [name] Kitchen PROAIR HFA 108 (90 Base) MCG/ACT inhaler     . RESTASIS 0.05 % ophthalmic emulsion Place 2 drops into both eyes daily as needed. For dry eyes    . simvastatin (ZOCOR) 20 MG tablet Take 20 mg by mouth daily.    [name] Kitchen triamcinolone cream (KENALOG) 0.1 % Apply 1 application topically daily.     [name] Kitchen dexamethasone (DECADRON) 4 MG tablet Take 1 tablet (4 mg total) by mouth daily. 14 tablet 0  . dexamethasone (DECADRON) 4 MG tablet Take 2 tablets by mouth once a day on the day after chemotherapy and then take  2 tablets two times a day for 2 days. Take with food. 30 tablet 1  . iron polysaccharides (NIFEREX) 150 MG capsule Take 1 capsule (150 mg total) by mouth daily. 30 capsule 1  . lidocaine-prilocaine (EMLA) cream Apply to affected area once 30 g 3  . ondansetron (ZOFRAN) 8 MG tablet Take 1 tablet (8 mg total) by mouth 2 (two) times daily as needed. Start on the third day after chemotherapy. 30 tablet 1  . prochlorperazine (COMPAZINE) 10 MG tablet Take 1 tablet (10 mg total) by mouth every 6 (six) hours as needed (Nausea or vomiting). 30 tablet 1   No current facility-administered medications for this visit.     REVIEW OF SYSTEMS:    10 Point review of Systems was done is negative except as noted above.  PHYSICAL EXAMINATION: ECOG PERFORMANCE STATUS: 2 - Symptomatic, <50% confined to bed  . Vitals:   01/18/17 1609  BP: 137/78  Pulse: 82  Resp: 18  Temp: 98.9 F (37.2 C)   Filed Weights   01/18/17 1609  Weight: 120 lb 11.2 oz (54.7 kg)   .Body mass index is 20.72 kg/m.  GENERAL:alert, in no acute distress and comfortable SKIN: skin color, texture, turgor are normal, no rashes or significant lesions EYES: normal,  conjunctiva are pink and non-injected, sclera clear OROPHARYNX:no exudate, no erythema and lips, buccal mucosa, and tongue normal  NECK: supple, no JVD, thyroid normal size, non-tender, without nodularity LYMPH:  no palpable lymphadenopathy in the cervical, axillary or inguinal LUNGS: clear to auscultation with normal respiratory effort HEART: regular rate & rhythm,  no murmurs and no lower extrety [place] ABDOMEN: abdomen soft, non-tender, normoactive bowel sounds  Musculoskeletal: no cyanosis of digits and no clubbing  PSYCH: alert & oriented x 3 with fluent speech NEURO: no focal motor/sensory deficits  LABORATORY DATA:  I have reviewed the data as listed  . CBC Latest Ref Rng & Units 12/30/2016 05/30/2011 01/01/2011  WBC 3.9 - 10.3 10e3/uL 12.4(H) 7.2 10.5  Hemoglobin 11.6 - 15.9 g/dL 9.1(L) 12.3 8.7(L)  Hematocrit 34.8 - 46.6 % 29.0(L) 36.6 26.6(L)  Platelets 145 - 400 10e3/uL 695(H) 383 313   . CBC    Component Value Date/Time   WBC 12.4 (H) 12/30/2016 1507   WBC 7.2 05/30/2011 1250   RBC 3.91 12/30/2016 1507   RBC 4.33 05/30/2011 1250   HGB 9.1 (L) 12/30/2016 1507   HCT 29.0 (L) 12/30/2016 1507   PLT 695 (H) 12/30/2016 1507   MCV 74.2 (L) 12/30/2016 1507   MCH 23.3 (L) 12/30/2016 1507   MCH 28.4 05/30/2011 1250   MCHC 31.4 (L) 12/30/2016 1507   MCHC 33.6 05/30/2011 1250   RDW 20.3 (H) 12/30/2016 1507   LYMPHSABS 0.6 (L) 12/30/2016 1507   MONOABS 1.2 (H) 12/30/2016 1507   EOSABS 0.3 12/30/2016 1507   BASOSABS 0.0 12/30/2016 1507    . CMP Latest Ref Rng & Units 12/30/2016 09/14/2012 05/30/2011  Glucose 70 - 140 mg/dl 233(H) 101(H) 108(H)  BUN 7.0 - 26.0 mg/dL 14.2 14 16   Creatinine 0.6 - 1.1 mg/dL 1.1 0.9 0.76  Sodium 136 - 145 mEq/L 137 140 139  Potassium 3.5 - 5.1 mEq/L 3.7 4.3 4.3  Chloride 96 - 112 mEq/L - 102 102  CO2 22 - 29 mEq/L 26 29 30   Calcium 8.4 - 10.4 mg/dL 9.2 9.0 8.9  Total Protein 6.4 - 8.3 g/dL 7.9 7.2 6.6  Total Bilirubin 0.20 - 1.20 mg/dL 0.37  1.0 0.5  Alkaline  Phos 40 - 150 U/L 124 56 61  AST 5 - 34 U/L 19 28 20   ALT 0 - 55 U/L 16 22 13    Component     Latest Ref Rng & Units 12/30/2016  LDH     125 - 245 U/L 185  Sed Rate     0 - 40 mm/hr 68 (H)  Hepatitis B Surface Ag     Negative Negative      RADIOGRAPHIC STUDIES: I have personally reviewed the radiological images as listed and agreed with the findings in the report. Nm Pet Image Initial (pi) Skull Base To Thigh  Result Date: 01/14/2017 CLINICAL DATA:  Initial treatment strategy for nodular sclerosis Hodgkin's lymphoma. Right neck biopsy December 23, 2016. EXAM: NUCLEAR MEDICINE PET SKULL BASE TO THIGH TECHNIQUE: 2.7 mCi F-18 FDG was injected intravenously. Full-ring PET imaging was performed from the skull base to thigh after the radiotracer. CT data was obtained and used for attenuation correction and anatomic localization. FASTING BLOOD GLUCOSE:  Value: 99 mg/dl COMPARISON:  MRI 04/26/2016 FINDINGS: NECK A right level Va lymph node measuring 0.9 cm in short axis on image 38 of series 3 has a maximum standard uptake value of 7.2. Additional clustered hypermetabolic supraclavicular lymph nodes are present bilaterally any more caudad location, level Vb. A right level IV lymph node at the thoracic inlet level has a short axis diameter of 2.0 cm on image 54/3 and a maximum standard uptake value of 14.2. CHEST Extensive paratracheal, prevascular, AP window, bilateral hilar, bilateral infrahilar, and subcarinal adenopathy noted. An index right paratracheal node on image 69/ 3 measures 2.1 cm in short axis and has a maximum SUV of 19.9. An upper left subpectoral lymph node has a short axis diameter 1.2 cm on image 62/3 and a maximum SUV of 6.1. Faintly metabolic lower thoracic periaortic adenopathy noted. Background mediastinal blood pool activity 2.3. Coronary, aortic arch, and branch vessel atherosclerotic vascular disease. Trace right pleural effusion. There is considerable  reticulonodular opacity inferiorly in the right upper lobe and in the right middle lobe and right lower lobe with associated airway thickening and some nodularity, but without a significant amount of metabolic activity. ABDOMEN/PELVIS Hypermetabolic gastrohepatic ligament, porta hepatis, periaortic, and external iliac adenopathy is present. An index gastrohepatic ligament lymph node measuring 1.6 cm in short axis diameter on image 130/3 has a maximum SUV of 11.6. A left periaortic node measuring 2.3 cm in short axis on image 145/ 3 has a maximum SUV of 5.4. Background liver activity SUV 2.8. Aortoiliac atherosclerotic vascular disease. No splenomegaly or splenic mass. SKELETON Subtle activity along the iliac side of the right sacroiliac joint corresponds with spurring and sclerosis and is probably inflammatory. IMPRESSION: 1. Hypermetabolic adenopathy in the lower neck, chest, and upper abdomen down to the level of the proximal common iliac chain as detailed above, compatible with primarily Deauville 5 lymphoma. Select lesions (not measured) would likely qualify as Deauville 4. No splenomegaly or splenic mass. 2. Coronary, aortic arch, and branch vessel atherosclerotic vascular disease. Aortoiliac atherosclerotic vascular disease. 3. Considerable reticulonodular opacity inferiorly in the right upper lobe and in the right middle lobe and right lower lobe, not very hypermetabolic, suspicious for atypical infectious process/bronchiolitis. This merits surveillance in course of the patient's therapy in order to ensure resolution. Electronically Signed   By: Van Clines M.D.   On: 01/14/2017 13:21   Transthoracic Echocardiography  Patient:    Gursimran, Theall MR #:  [id number] Study Date: 06/11/2016 Gender:     F Age:        74 Height:     162.6 cm Weight:     62 kg BSA:        1.68 m^2 Pt. Status: Room:   Jetty Duhamel, M.D.  REFERRING    [name], M.D.  ATTENDING    Loralie Champagne, M.D.  PERFORMING   [facility], Outpatient  SONOGRAPHER  Bethany McMahill, RDCS  cc:  ------------------------------------------------------------------- LV EF: 60% -   65%  ------------------------------------------------------------------- Indications:      TIA (G45.9).  ------------------------------------------------------------------- History:   PMH:   Coronary artery disease.  Stroke.  Risk factors: Family history of coronary artery disease. Former tobacco use. Hypertension. Dyslipidemia.  ------------------------------------------------------------------- Study Conclusions  - Left ventricle: The cavity size was normal. Wall thickness was   increased in a pattern of mild LVH. Systolic function was normal.   The estimated ejection fraction was in the range of 60% to 65%.   Wall motion was normal; there were no regional wall motion   abnormalities. Doppler parameters are consistent with abnormal   left ventricular relaxation (grade 1 diastolic dysfunction).   Normal strain pattern, GLS -22.7%. - Aortic valve: There was no stenosis. - Mitral valve: Mildly calcified annulus. There was no significant   regurgitation. - Left atrium: The atrium was mildly dilated. - Right ventricle: The cavity size was normal. Systolic function   was normal. - Pulmonary arteries: PA peak pressure: 19 mm Hg (S). - Inferior vena cava: The vessel was normal in size. The   respirophasic diameter changes were in the normal range (= 50%),   consistent with normal central venous pressure.  Impressions:  - Normal LV size and systolic function, EF [id number]. Mild LV   hypertrophy. Normal RV size and systolic function. No significant   valvular abnormalities.   ASSESSMENT & PLAN:   83 year old Caucasian female with ECOG performance status of 2 with  1) Newly diagnosed classical Hodgkin's lymphoma of nodular sclerosis variety . Atleast Stage IIIB some concern for possible Rt lung  involvement which make in Stage IVB Presented with right neck lymph nodes . She has type B constitutional symptoms with 15-20 pound weight loss night sweats or chills  Also noted to have significant pruritus with mild rash likely from Hodgkin's   echo in the last 6 months in May 2017 which showed normal ejection fraction. No new cardiac symptomatology since then. 2) Microcytic anemia - likely anemia of chronic disease due to Hodgkin's lymphoma .  We'll rule out iron deficiency though less likely PLAN -I review the results and imaging on the PET/CT, staging, diagnosis of classical Hodgkin's lymphoma, prognosis, treatment options and toxicities and alternatives. -Her case was discussed in our Hematology tumor board. -she was recommended AVD (i.e ABVD without bleomycin) with G-CSF support given limited BM reserve considering the patient age.  -Labs reviewed elevated sedimentation rate. Hepatitis B sAg neg -transfuse prn for symptomatic anemia of Hgb<8 -Port placement -I went over the chemotherapy medications and their main considerations -chemotherapy- counseling with RN -starting AVD chemotherapy ASAP -patient was started on Dexamethasone  4mg  po daily to help with constitutional symptoms and pruritus pending initiation of chemotherapy. -Her daughter had questions about whether patient's LTC could kick in since she has additional insurance for this -- SW contact information was provided. 3) . Patient Active Problem List   Diagnosis Date Noted  . Nodular sclerosis Hodgkin lymphoma of  lymph nodes of neck ([place]) 01/13/2017  . Dyspnea 09/14/2012  . Epistaxis 06/02/2011  . HYPERCHOLESTEROLEMIA, MIXED 01/08/2009  . ESSENTIAL HYPERTENSION, BENIGN 01/08/2009  . Coronary atherosclerosis 01/08/2009  . HYPOTHYROIDISM, HX OF 01/08/2009  PLAN -Continue follow-up with primary care physician for management of other chronic medical co-morbidities  AVD chemotherapy to start 01/25/2017 [facility] placement for  chemotherapy (IR) Chemo-counseling Social worker consultation Dietician consult RTC with Dr [name] with labs in 1 week post C1D1 for toxicity check    All of the patients questions were answered with apparent satisfaction. The patient knows to call the clinic with any problems, questions or concerns.  I spent 30 minutes counseling the patient face to face. The total time spent in the appointment was 40 minutes and more than 50% was on counseling and direct patient cares.    Sullivan Lone MD [place] AAHIVMS [facility] Hematology/Oncology Physician [facility]  (Office):       [phone] (Work cell):  [phone] (Fax):           [phone]

## 2017-01-25 ENCOUNTER — Ambulatory Visit (HOSPITAL_COMMUNITY)
Admission: RE | Admit: 2017-01-25 | Discharge: 2017-01-25 | Disposition: A | Discharge status: Home / Self Care | Payer: Medicare Other | Source: Ambulatory Visit | Attending: Hematology | Admitting: Hematology

## 2017-01-25 ENCOUNTER — Other Ambulatory Visit: Payer: Self-pay | Admitting: Hematology

## 2017-01-25 ENCOUNTER — Encounter (HOSPITAL_COMMUNITY): Payer: Self-pay

## 2017-01-25 DIAGNOSIS — E7800 Pure hypercholesterolemia, unspecified: Secondary | ICD-10-CM | POA: Insufficient documentation

## 2017-01-25 DIAGNOSIS — M1990 Unspecified osteoarthritis, unspecified site: Secondary | ICD-10-CM | POA: Insufficient documentation

## 2017-01-25 DIAGNOSIS — I2510 Atherosclerotic heart disease of native coronary artery without angina pectoris: Secondary | ICD-10-CM | POA: Insufficient documentation

## 2017-01-25 DIAGNOSIS — C8108 Nodular lymphocyte predominant Hodgkin lymphoma, lymph nodes of multiple sites: Secondary | ICD-10-CM | POA: Insufficient documentation

## 2017-01-25 DIAGNOSIS — Z8249 Family history of ischemic heart disease and other diseases of the circulatory system: Secondary | ICD-10-CM | POA: Diagnosis not present

## 2017-01-25 DIAGNOSIS — Z96653 Presence of artificial knee joint, bilateral: Secondary | ICD-10-CM | POA: Diagnosis not present

## 2017-01-25 DIAGNOSIS — C8111 Nodular sclerosis classical Hodgkin lymphoma, lymph nodes of head, face, and neck: Secondary | ICD-10-CM

## 2017-01-25 DIAGNOSIS — E039 Hypothyroidism, unspecified: Secondary | ICD-10-CM | POA: Insufficient documentation

## 2017-01-25 DIAGNOSIS — Z833 Family history of diabetes mellitus: Secondary | ICD-10-CM | POA: Insufficient documentation

## 2017-01-25 DIAGNOSIS — Z8673 Personal history of transient ischemic attack (TIA), and cerebral infarction without residual deficits: Secondary | ICD-10-CM | POA: Insufficient documentation

## 2017-01-25 DIAGNOSIS — K589 Irritable bowel syndrome without diarrhea: Secondary | ICD-10-CM | POA: Diagnosis not present

## 2017-01-25 DIAGNOSIS — I10 Essential (primary) hypertension: Secondary | ICD-10-CM | POA: Diagnosis not present

## 2017-01-25 HISTORY — PX: IR GENERIC HISTORICAL: IMG1180011

## 2017-01-25 LAB — CBC
HCT: 27.4 % — ABNORMAL LOW (ref 36.0–46.0)
Hemoglobin: 8.7 g/dL — ABNORMAL LOW (ref 12.0–15.0)
MCH: 22.7 pg — ABNORMAL LOW (ref 26.0–34.0)
MCHC: 31.8 g/dL (ref 30.0–36.0)
MCV: 71.4 fL — ABNORMAL LOW (ref 78.0–100.0)
Platelets: 637 10*3/uL — ABNORMAL HIGH (ref 150–400)
RBC: 3.84 MIL/uL — ABNORMAL LOW (ref 3.87–5.11)
RDW: 19.1 % — ABNORMAL HIGH (ref 11.5–15.5)
WBC: 13.7 10*3/uL — ABNORMAL HIGH (ref 4.0–10.5)

## 2017-01-25 LAB — PROTIME-INR
INR: 1.25
Prothrombin Time: 15.8 seconds — ABNORMAL HIGH (ref 11.4–15.2)

## 2017-01-25 LAB — APTT: aPTT: 46 seconds — ABNORMAL HIGH (ref 24–36)

## 2017-01-25 MED ORDER — LIDOCAINE HCL 1 % IJ SOLN
INTRAMUSCULAR | Status: AC | PRN
  Administered 2017-01-25: 15 mL

## 2017-01-25 MED ORDER — MIDAZOLAM HCL 2 MG/2ML IJ SOLN
INTRAMUSCULAR | Status: AC | PRN
  Administered 2017-01-25 (×2): 0.5 mg via INTRAVENOUS
  Administered 2017-01-25: 1 mg via INTRAVENOUS

## 2017-01-25 MED ORDER — NALOXONE HCL 0.4 MG/ML IJ SOLN
INTRAMUSCULAR | Status: AC
  Filled 2017-01-25: qty 1

## 2017-01-25 MED ORDER — FENTANYL CITRATE (PF) 100 MCG/2ML IJ SOLN
INTRAMUSCULAR | Status: AC
  Filled 2017-01-25: qty 4

## 2017-01-25 MED ORDER — CEFAZOLIN SODIUM-DEXTROSE 2-4 GM/100ML-% IV SOLN
2.0000 g | INTRAVENOUS | Status: AC
  Administered 2017-01-25: 2 g via INTRAVENOUS
  Filled 2017-01-25: qty 100

## 2017-01-25 MED ORDER — SODIUM CHLORIDE 0.9 % IV SOLN
INTRAVENOUS | Status: DC
Start: 2017-01-25 — End: 2017-01-26
  Administered 2017-01-25: 09:00:00 via INTRAVENOUS

## 2017-01-25 MED ORDER — MIDAZOLAM HCL 2 MG/2ML IJ SOLN
INTRAMUSCULAR | Status: AC
  Filled 2017-01-25: qty 4

## 2017-01-25 MED ORDER — HEPARIN SOD (PORK) LOCK FLUSH 100 UNIT/ML IV SOLN
INTRAVENOUS | Status: AC | PRN
  Administered 2017-01-25: 500 [IU] via INTRAVENOUS

## 2017-01-25 MED ORDER — FLUMAZENIL 0.5 MG/5ML IV SOLN
INTRAVENOUS | Status: AC
  Filled 2017-01-25: qty 5

## 2017-01-25 MED ORDER — LIDOCAINE HCL 1 % IJ SOLN
INTRAMUSCULAR | Status: AC
  Filled 2017-01-25: qty 20

## 2017-01-25 MED ORDER — FENTANYL CITRATE (PF) 100 MCG/2ML IJ SOLN
INTRAMUSCULAR | Status: AC | PRN
  Administered 2017-01-25 (×4): 25 ug via INTRAVENOUS

## 2017-01-25 MED ORDER — HEPARIN SOD (PORK) LOCK FLUSH 100 UNIT/ML IV SOLN
INTRAVENOUS | Status: AC
  Filled 2017-01-25: qty 5

## 2017-01-25 NOTE — Procedures (Signed)
Interventional Radiology Procedure Note  Procedure: Placement of a right IJ approach single lumen PowerPort.  Tip is positioned at the superior cavoatrial junction and catheter is ready for immediate use.  Complications: No immediate Recommendations:  - Ok to shower tomorrow - Do not submerge for 7 days - Routine line care   Kim Lane, M.D Pager:  [phone]   

## 2017-01-25 NOTE — H&P (Signed)
Chief Complaint: lymphoma, needs port  Referring Physician:Dr. Sullivan Lone  Supervising Physician: [name]  Patient Status: [facility] - Out-pt  HPI: Kim Lane is an 83 y.o. female who has a history of CAD, HTN, CVA, who was recently noted to have lymphadenopathy.  She had an excisional LN bx of a node in her neck which revealed Hodgkin's lymphoma.  She is being seen and managed by Dr. [name].  She is going to start chemotherapy tomorrow and a request has been made for a PAC to be placed today prior to treatment.  She denies any complaints such as CP, SOB, fevers, HA, chills.  Past Medical History:  Past Medical History:  Diagnosis Date  . CAD (coronary artery disease) 02/2006   Taxus stent placed to LAD and Diagonal per Dr. Olevia Perches  . HTN (hypertension)   . Hypercholesterolemia   . Hypothyroidism   . IBS (irritable bowel syndrome)   . Osteoarthritis   . Stroke [facility]) 03/2016    Past Surgical History:  Past Surgical History:  Procedure Laterality Date  . APPENDECTOMY    . MASS EXCISION Right 12/22/2016   Procedure: RIGHT NECK LYMPH NODE BIOPSY;  Surgeon: Rozetta Nunnery, MD;  Location: [place];  Service: ENT;  Laterality: Right;  . stents     in heart  . TOTAL KNEE ARTHROPLASTY Bilateral 2011,2012  . VAGINAL HYSTERECTOMY      Family History:  Family History  Problem Relation Age of Onset  . Diabetes Mother   . Dementia Mother   . ALS Mother   . Heart Problems Father   . Liver disease Sister   . Cirrhosis Sister   . Heart block      CABG  . Diabetes Sister   . Heart block Son     CABG  . Other [place]  . Other Daughter     [place]  . Dementia Sister     Social History:  reports that she has quit smoking. She has never used smokeless tobacco. She reports that she does not drink alcohol or use drugs.  Allergies:  Allergies  Allergen Reactions  . Fosamax [Alendronate Sodium] Other (See Comments)    "made me ache  all over"    Medications: Medications reviewed in epic.  Please HPI for pertinent positives, otherwise complete 10 system ROS negative, except fatigue.  Mallampati Score: MD Evaluation Airway: WNL Heart: WNL Abdomen: WNL Chest/ Lungs: WNL ASA  Classification: 3 Mallampati/Airway Score: Two  Physical Exam: BP 126/61 (BP Location: Right Arm)   Pulse 91   Temp 98.2 F (36.8 C) (Oral)   Resp 16   Ht 5\' 4"  (1.626 m)   Wt 120 lb 11.2 oz (54.7 kg) Comment: from appt 1/29  SpO2 98%   BMI 20.72 kg/m  Body mass index is 20.72 kg/m. General: pleasant, elderly white female who is laying in bed in NAD HEENT: head is normocephalic, atraumatic.  Sclera are noninjected.  PERRL.  Ears and nose without any masses or lesions.  Mouth is pink and moist Heart: regular, rate, and rhythm.  Normal s1,s2. No obvious murmurs, gallops, or rubs noted.  Palpable radial and pedal pulses bilaterally Lungs: CTAB, no wheezes, rhonchi, or rales noted.  Respiratory effort nonlabored Abd: soft, NT, ND, +BS, no masses, hernias, or organomegaly Psych: A&Ox3 with an appropriate affect.   Labs: Results for orders placed or performed during the hospital encounter of 01/25/17 (from the  past 48 hour(s))  APTT upon arrival     Status: Abnormal   Collection Time: 01/25/17  8:07 AM  Result Value Ref Range   aPTT 46 (H) 24 - 36 seconds    Comment:        IF BASELINE aPTT IS ELEVATED, SUGGEST PATIENT RISK ASSESSMENT BE USED TO DETERMINE APPROPRIATE ANTICOAGULANT THERAPY.   CBC upon arrival     Status: Abnormal   Collection Time: 01/25/17  8:07 AM  Result Value Ref Range   WBC 13.7 (H) 4.0 - 10.5 K/uL   RBC 3.84 (L) 3.87 - 5.11 MIL/uL   Hemoglobin 8.7 (L) 12.0 - 15.0 g/dL   HCT 27.4 (L) 36.0 - 46.0 %   MCV 71.4 (L) 78.0 - 100.0 fL   MCH 22.7 (L) 26.0 - 34.0 pg   MCHC 31.8 30.0 - 36.0 g/dL   RDW 19.1 (H) 11.5 - 15.5 %   Platelets 637 (H) 150 - 400 K/uL  Protime-INR upon arrival     Status: Abnormal    Collection Time: 01/25/17  8:07 AM  Result Value Ref Range   Prothrombin Time 15.8 (H) 11.4 - 15.2 seconds   INR 1.25     Imaging: No results found.  Assessment/Plan 1. Hodgkin's lymphoma -we will plan to place a PAC today so the patient can initiate chemotherapy tomorrow -labs and vitals reviewed.  WBC slightly up, but this is likely secondary to her lymphoma.  She has no infectious symptoms -Risks and Benefits discussed with the patient including, but not limited to bleeding, infection, pneumothorax, or fibrin sheath development and need for additional procedures. All of the patient's questions were answered, patient is agreeable to proceed. Consent signed and in chart.  Thank you for this interesting consult.  I greatly enjoyed meeting Kim Lane and look forward to participating in their care.  A copy of this report was sent to the requesting provider on this date.  Electronically Signed: Henreitta Cea 01/25/2017, 9:10 AM   I spent a total of  30 Minutes   in face to face in clinical consultation, greater than 50% of which was counseling/coordinating care for lymphoma

## 2017-01-25 NOTE — Discharge Instructions (Signed)
Implanted Port Insertion, Care After °Refer to this sheet in the next few weeks. These instructions provide you with information on caring for yourself after your procedure. Your health care provider may also give you more specific instructions. Your treatment has been planned according to current medical practices, but problems sometimes occur. Call your health care provider if you have any problems or questions after your procedure. °WHAT TO EXPECT AFTER THE PROCEDURE °After your procedure, it is typical to have the following:  °· Discomfort at the port insertion site. Ice packs to the area will help. °· Bruising on the skin over the port. This will subside in 3-4 days. °HOME CARE INSTRUCTIONS °· After your port is placed, you will get a manufacturer's information card. The card has information about your port. Keep this card with you at all times.   °· Know what kind of port you have. There are many types of ports available.   °· Wear a medical alert bracelet in case of an emergency. This can help alert health care workers that you have a port.   °· The port can stay in for as long as your health care provider believes it is necessary.   °· A home health care nurse may give medicines and take care of the port.   °· You or a family member can get special training and directions for giving medicine and taking care of the port at home.   °SEEK MEDICAL CARE IF:  °· Your port does not flush or you are unable to get a blood return.   °· You have a fever or chills. °SEEK IMMEDIATE MEDICAL CARE IF: °· You have new fluid or pus coming from your incision.   °· You notice a bad smell coming from your incision site.   °· You have swelling, pain, or more redness at the incision or port site.   °· You have chest pain or shortness of breath. °This information is not intended to replace advice given to you by your health care provider. Make sure you discuss any questions you have with your health care provider. °Document  Released: 09/27/2013 Document Revised: 12/12/2013 Document Reviewed: 09/27/2013 °Elsevier Interactive Patient Education © 2017 Else[employer] ° °Moderate Conscious Sedation, Adult, Care After °These instructions provide you with information about caring for yourself after your procedure. Your health care provider may also give you more specific instructions. Your treatment has been planned according to current medical practices, but problems sometimes occur. Call your health care provider if you have any problems or questions after your procedure. °What can I expect after the procedure? °After your procedure, it is common: °· To feel sleepy for several hours. °· To feel clumsy and have poor balance for several hours. °· To have poor judgment for several hours. °· To vomit if you eat too soon. °Follow these instructions at home: °For at least 24 hours after the procedure:  °· Do not: °¨ Participate in activities where you could fall or become injured. °¨ Drive. °¨ Use heavy machinery. °¨ Drink alcohol. °¨ Take sleeping pills or medicines that cause drowsiness. °¨ Make important decisions or sign legal documents. °¨ Take care of children on your own. °· Rest. °Eating and drinking °· Follow the diet recommended by your health care provider. °· If you vomit: °¨ Drink water, juice, or soup when you can drink without vomiting. °¨ Make sure you have little or no nausea before eating solid foods. °General instructions °· Have a responsible adult stay with you until you are awake and alert. °·   Take over-the-counter and prescription medicines only as told by your health care provider. °· If you smoke, do not smoke without supervision. °· Keep all follow-up visits as told by your health care provider. This is important. °Contact a health care provider if: °· You keep feeling nauseous or you keep vomiting. °· You feel light-headed. °· You develop a rash. °· You have a fever. °Get help right away if: °· You have trouble  breathing. °This information is not intended to replace advice given to you by your health care provider. Make sure you discuss any questions you have with your health care provider. °Document Released: 09/27/2013 Document Revised: 05/11/2016 Document Reviewed: 03/28/2016 °Elsevier Interactive Patient Education © 2017 [employer]. ° °

## 2017-01-26 ENCOUNTER — Ambulatory Visit: Payer: Medicare Other

## 2017-01-26 ENCOUNTER — Other Ambulatory Visit: Payer: Self-pay | Admitting: *Deleted

## 2017-01-26 ENCOUNTER — Ambulatory Visit (HOSPITAL_COMMUNITY)
Admission: RE | Admit: 2017-01-26 | Discharge: 2017-01-26 | Disposition: A | Discharge status: Home / Self Care | Payer: Medicare Other | Source: Ambulatory Visit | Attending: Hematology | Admitting: Hematology

## 2017-01-26 ENCOUNTER — Ambulatory Visit (HOSPITAL_BASED_OUTPATIENT_CLINIC_OR_DEPARTMENT_OTHER): Payer: Medicare Other

## 2017-01-26 ENCOUNTER — Other Ambulatory Visit (HOSPITAL_BASED_OUTPATIENT_CLINIC_OR_DEPARTMENT_OTHER): Payer: Medicare Other

## 2017-01-26 ENCOUNTER — Encounter: Payer: Self-pay | Admitting: *Deleted

## 2017-01-26 ENCOUNTER — Ambulatory Visit: Payer: Medicare Other | Admitting: Nutrition

## 2017-01-26 VITALS — BP 102/64 | HR 84 | Temp 98.6°F | Resp 18

## 2017-01-26 DIAGNOSIS — Z5111 Encounter for antineoplastic chemotherapy: Secondary | ICD-10-CM

## 2017-01-26 DIAGNOSIS — D649 Anemia, unspecified: Secondary | ICD-10-CM | POA: Diagnosis present

## 2017-01-26 DIAGNOSIS — C8111 Nodular sclerosis classical Hodgkin lymphoma, lymph nodes of head, face, and neck: Secondary | ICD-10-CM

## 2017-01-26 LAB — COMPREHENSIVE METABOLIC PANEL
ALT: 13 U/L (ref 0–55)
AST: 18 U/L (ref 5–34)
Albumin: 2.2 g/dL — ABNORMAL LOW (ref 3.5–5.0)
Alkaline Phosphatase: 126 U/L (ref 40–150)
Anion Gap: 8 mEq/L (ref 3–11)
BUN: 13.5 mg/dL (ref 7.0–26.0)
CO2: 25 mEq/L (ref 22–29)
Calcium: 9.1 mg/dL (ref 8.4–10.4)
Chloride: 101 mEq/L (ref 98–109)
Creatinine: 0.8 mg/dL (ref 0.6–1.1)
EGFR: 66 mL/min/{1.73_m2} — ABNORMAL LOW (ref >90)
Glucose: 146 mg/dl — ABNORMAL HIGH (ref 70–140)
Potassium: 4 mEq/L (ref 3.5–5.1)
Sodium: 134 mEq/L — ABNORMAL LOW (ref 136–145)
Total Bilirubin: 0.48 mg/dL (ref 0.20–1.20)
Total Protein: 7.5 g/dL (ref 6.4–8.3)

## 2017-01-26 LAB — CBC WITH DIFFERENTIAL/PLATELET
BASO%: 0.6 % (ref 0.0–2.0)
Basophils Absolute: 0.1 10*3/uL (ref 0.0–0.1)
EOS%: 0.5 % (ref 0.0–7.0)
Eosinophils Absolute: 0.1 10*3/uL (ref 0.0–0.5)
HCT: 25.6 % — ABNORMAL LOW (ref 34.8–46.6)
HGB: 8.1 g/dL — ABNORMAL LOW (ref 11.6–15.9)
LYMPH%: 3.3 % — ABNORMAL LOW (ref 14.0–49.7)
MCH: 22.8 pg — ABNORMAL LOW (ref 25.1–34.0)
MCHC: 31.7 g/dL (ref 31.5–36.0)
MCV: 71.8 fL — ABNORMAL LOW (ref 79.5–101.0)
MONO#: 1.3 10*3/uL — ABNORMAL HIGH (ref 0.1–0.9)
MONO%: 8.9 % (ref 0.0–14.0)
NEUT#: 12.2 10*3/uL — ABNORMAL HIGH (ref 1.5–6.5)
NEUT%: 86.7 % — ABNORMAL HIGH (ref 38.4–76.8)
Platelets: 616 10*3/uL — ABNORMAL HIGH (ref 145–400)
RBC: 3.56 10*6/uL — ABNORMAL LOW (ref 3.70–5.45)
RDW: 20.4 % — ABNORMAL HIGH (ref 11.2–14.5)
WBC: 14.1 10*3/uL — ABNORMAL HIGH (ref 3.9–10.3)
lymph#: 0.5 10*3/uL — ABNORMAL LOW (ref 0.9–3.3)

## 2017-01-26 LAB — TECHNOLOGIST REVIEW

## 2017-01-26 MED ORDER — VINBLASTINE SULFATE CHEMO INJECTION 1 MG/ML
6.3000 mg/m2 | Freq: Once | INTRAVENOUS | Status: AC
  Administered 2017-01-26: 10 mg via INTRAVENOUS
  Filled 2017-01-26: qty 10

## 2017-01-26 MED ORDER — SODIUM CHLORIDE 0.9 % IV SOLN
Freq: Once | INTRAVENOUS | Status: AC
  Administered 2017-01-26: 11:00:00 via INTRAVENOUS

## 2017-01-26 MED ORDER — DOXORUBICIN HCL CHEMO IV INJECTION 2 MG/ML
25.0000 mg/m2 | Freq: Once | INTRAVENOUS | Status: AC
  Administered 2017-01-26: 40 mg via INTRAVENOUS
  Filled 2017-01-26: qty 20

## 2017-01-26 MED ORDER — SODIUM CHLORIDE 0.9 % IV SOLN
Freq: Once | INTRAVENOUS | Status: AC
  Administered 2017-01-26: 12:00:00 via INTRAVENOUS
  Filled 2017-01-26: qty 5

## 2017-01-26 MED ORDER — PEGFILGRASTIM 6 MG/0.6ML ~~LOC~~ PSKT
6.0000 mg | PREFILLED_SYRINGE | Freq: Once | SUBCUTANEOUS | Status: AC
  Administered 2017-01-26: 6 mg via SUBCUTANEOUS
  Filled 2017-01-26: qty 0.6

## 2017-01-26 MED ORDER — SODIUM CHLORIDE 0.9 % IV SOLN
375.0000 mg/m2 | Freq: Once | INTRAVENOUS | Status: AC
  Administered 2017-01-26: 600 mg via INTRAVENOUS
  Filled 2017-01-26: qty 30

## 2017-01-26 MED ORDER — PALONOSETRON HCL INJECTION 0.25 MG/5ML
INTRAVENOUS | Status: AC
  Filled 2017-01-26: qty 5

## 2017-01-26 MED ORDER — SODIUM CHLORIDE 0.9% FLUSH
10.0000 mL | INTRAVENOUS | Status: DC | PRN
  Administered 2017-01-26: 10 mL
  Filled 2017-01-26: qty 10

## 2017-01-26 MED ORDER — PALONOSETRON HCL INJECTION 0.25 MG/5ML
0.2500 mg | Freq: Once | INTRAVENOUS | Status: AC
  Administered 2017-01-26: 0.25 mg via INTRAVENOUS

## 2017-01-26 MED ORDER — HEPARIN SOD (PORK) LOCK FLUSH 100 UNIT/ML IV SOLN
500.0000 [IU] | Freq: Once | INTRAVENOUS | Status: AC | PRN
  Administered 2017-01-26: 500 [IU]
  Filled 2017-01-26: qty 5

## 2017-01-26 NOTE — Progress Notes (Signed)
83 year old female diagnosed with Hodgkin's lymphoma. She is a patient of Dr.Kale.  Past medical history includes stroke, osteoarthritis, IBS, hypothyroidism, hypertension, CAD, and hypercholesterolemia.  Medications include coenzyme Q10, Decadron, Synthroid, multivitamin, fish oil, Zofran, Compazine, and Zocor.  Labs include glucose 233, and albumin 2.5 on January 10.  Height: 5 feet 4 inches. Weight: 120.7 pounds. Usual body weight: 140 pounds in June 2017.  Patient weighed 130.8 pounds 09/28/2016. BMI: 20.72.  Patient admits to poor appetite and poor by mouth intake. She thinks she is now starting to eat a little more. Patient reports she has had gradual weight loss. She is drinking one oral nutrition supplement a day but does not remember which one. Reports bowel issues secondary to IBS. Physical exam deferred.  Nutrition diagnosis: Unintended weight loss related to poor appetite as evidenced by 20 pound weight loss from usual body weight.  Severe malnutrition in the context of acute illness secondary to greater than 7.5% weight loss over 3 months and less than 50% energy intake for greater than 5 days.  Intervention: Educated patient on strategies for improving appetite and oral intake. Recommended small frequent meals and snacks. Educated patient on sources of high calories and high protein and provided fact sheets. Recommended patient increase oral nutrition supplements to Ensure Plus or boost plus or equivalent twice a day. Questions were answered.  Teach back method used.  Contact information provided.  Monitoring, evaluation, goals:  Patient will tolerate increased calories and protein to minimize further weight loss and tolerate treatment.  Next visit: Tuesday, February 20, during infusion  **Disclaimer: This note was dictated with voice recognition software. Similar sounding words can inadvertently be transcribed and this note may contain transcription errors which  may not have been corrected upon publication of note.**

## 2017-01-26 NOTE — Patient Instructions (Signed)
[facility] Discharge Instructions for Patients Receiving Chemotherapy  Today you received the following chemotherapy agents Adriamycin, Vinblastine & DTIC  To help prevent nausea and vomiting after your treatment, we encourage you to take your nausea medication as directed.  Take prochlorperazine (Compazine) for 3 days following chemo every 6 hours as needed.  After the 4th day you can begin using Compazine or Zofran for nausea.  ONPRO placed on 01/26/2017 @____________  DO NOT remove until 01/27/2017 @__________________    If you develop nausea and vomiting that is not controlled by your nausea medication, call the clinic.   BELOW ARE SYMPTOMS THAT SHOULD BE REPORTED IMMEDIATELY:  *FEVER GREATER THAN 100.5 F  *CHILLS WITH OR WITHOUT FEVER  NAUSEA AND VOMITING THAT IS NOT CONTROLLED WITH YOUR NAUSEA MEDICATION  *UNUSUAL SHORTNESS OF BREATH  *UNUSUAL BRUISING OR BLEEDING  TENDERNESS IN MOUTH AND THROAT WITH OR WITHOUT PRESENCE OF ULCERS  *URINARY PROBLEMS  *BOWEL PROBLEMS  UNUSUAL RASH Items with * indicate a potential emergency and should be followed up as soon as possible.  Feel free to call the clinic you have any questions or concerns. The clinic phone number is [phone].  Please show the [facility] at check-in to the Emergency Department and triage nurse.

## 2017-01-26 NOTE — Progress Notes (Signed)
[facility] Clinical Social Work  [facility] representative received referral from [employer] for resources and assessment of needs.  CSW met with patient and patients daughter in the infusion room to offer support and assess for needs.  Patient stated she felt overwhelmed when she was diagnosed, but feels "better" after getting more information and starting treatment.  Patient has a positive support system in her family.  Patient and patients daughter had questions regarding a [facility] and Cancer Policy.  Patient and family are waiting to received a written copy of the policy to clarify benefits.  They are also scheduled to speak with someone through the policy regarding assessments and resources.  CSW reviewed additional resources at [facility] and in the community, but encouraged patient and family to clarify what benefits the LTC policy offered.  Patient and family are planning to review policy and contact CSW with additional questions or concerns.  CSW provided contact information.  Patient and family were appreciative of CSW contact.   Johnnye Lana, MSW, LCSW, OSW-C Clinical Social Worker [facility] [phone]

## 2017-01-26 NOTE — Progress Notes (Signed)
01/26/2017 Ok to treat Per Dr. [name] with hgb 8.1.  Ordering 2 units of PRBCs to be infused tomorrow if possible.

## 2017-01-27 ENCOUNTER — Ambulatory Visit: Payer: Medicare Other

## 2017-01-27 ENCOUNTER — Ambulatory Visit (HOSPITAL_COMMUNITY)
Admission: RE | Admit: 2017-01-27 | Discharge: 2017-01-27 | Disposition: A | Discharge status: Home / Self Care | Payer: Medicare Other | Source: Ambulatory Visit | Attending: Hematology | Admitting: Hematology

## 2017-01-27 DIAGNOSIS — D649 Anemia, unspecified: Secondary | ICD-10-CM

## 2017-01-27 LAB — PREPARE RBC (CROSSMATCH)

## 2017-01-27 MED ORDER — DIPHENHYDRAMINE HCL 25 MG PO CAPS
25.0000 mg | ORAL_CAPSULE | Freq: Once | ORAL | Status: AC
  Administered 2017-01-27: 25 mg via ORAL
  Filled 2017-01-27: qty 1

## 2017-01-27 MED ORDER — SODIUM CHLORIDE 0.9% FLUSH
10.0000 mL | INTRAVENOUS | Status: AC | PRN
  Administered 2017-01-27: 10 mL

## 2017-01-27 MED ORDER — HEPARIN SOD (PORK) LOCK FLUSH 100 UNIT/ML IV SOLN
500.0000 [IU] | Freq: Every day | INTRAVENOUS | Status: AC | PRN
Start: 2017-01-27 — End: 2017-01-27
  Administered 2017-01-27: 500 [IU]
  Filled 2017-01-27: qty 5

## 2017-01-27 MED ORDER — ACETAMINOPHEN 325 MG PO TABS
650.0000 mg | ORAL_TABLET | Freq: Once | ORAL | Status: AC
  Administered 2017-01-27: 650 mg via ORAL
  Filled 2017-01-27: qty 2

## 2017-01-27 MED ORDER — SODIUM CHLORIDE 0.9 % IV SOLN
250.0000 mL | Freq: Once | INTRAVENOUS | Status: AC
  Administered 2017-01-27: 250 mL via INTRAVENOUS

## 2017-01-27 NOTE — Discharge Instructions (Signed)
Blood Transfusion A blood transfusion is a procedure in which you are given blood through an IV tube. You may need this procedure because of:  Illness.  Surgery.  Injury.  The blood may come from someone else (a donor). You may also be able to donate blood for yourself (autologous blood donation). The blood given in a transfusion is made up of different types of cells. You may get:  Red blood cells. These carry oxygen to the cells in the body.  White blood cells. These help you fight infections.  Platelets. These help your blood to clot.  Plasma. This is the liquid part of your blood. It helps with fluid imbalances.  If you have a clotting disorder, you may also get other types of blood products. What happens before the procedure?  You will have a blood test to find out your blood type. The test also finds out what type of blood your body will accept and matches it to the donor type.  If you are going to have a planned surgery, you may be able to donate your own blood. This may be done in case you need a transfusion.  If you have had an allergic reaction to a transfusion in the past, you may be given medicine to help prevent a reaction. This medicine may be given to you by mouth or through an IV.  You will have your temperature, blood pressure, and pulse checked.  Follow instructions from your doctor about what you cannot eat or drink.  Ask your doctor about: ? Changing or stopping your regular medicines. This is important if you take diabetes medicines or blood thinners. ? Taking medicines such as aspirin and ibuprofen. These medicines can thin your blood. Do not take these medicines before your procedure if your doctor tells you not to. What happens during the procedure?  An IV tube will be put into one of your veins.  The bag of donated blood will be attached to your IV tube. Then, the blood will enter through your vein.  Your temperature, blood pressure, and pulse will be  checked regularly during the procedure. This is done to find early signs of a transfusion reaction.  If you have any signs or symptoms of a reaction, your transfusion will be stopped. You may also be given medicine.  When the transfusion is done, your IV tube will be taken out.  Pressure may be applied to the IV site for a few minutes.  A bandage (dressing) will be put on the IV site. The procedure may vary among doctors and hospitals. What happens after the procedure?  Your temperature, blood pressure, heart rate, breathing rate, and blood oxygen level will be checked often.  Your blood may be tested to see how you are responding to the transfusion.  You may be warmed with fluids or blankets. This is done to keep the temperature of your body normal. Summary  A blood transfusion is a procedure in which you are given blood through an IV tube.  The blood may come from someone else (a donor). You may also be able to donate blood for yourself.  If you have had an allergic reaction to a transfusion in the past, you may be given medicine to help prevent a reaction. This medicine may be given to you by mouth or through an IV tube.  Your temperature, blood pressure, heart rate, breathing rate, and blood oxygen level will be checked often.  Your blood may be tested to   see how you are responding to the transfusion. This information is not intended to replace advice given to you by your health care provider. Make sure you discuss any questions you have with your health care provider. Document Released: 03/05/2009 Document Revised: 07/31/2016 Document Reviewed: 07/31/2016 Elsevier Interactive Patient Education  2017 Elsevier Inc.  

## 2017-01-27 NOTE — Progress Notes (Signed)
Provider: Carolyne Fiscal   Diagnosis Association: Symptomatic anemia (D64.9)  Treatment: 2 units of PRBC's via IVPB  Patient received 2 units of PRBC's and tolerated procedure well with no transfusion reaction. Patient alert, orientated, and ambulatory at time of discharge. Patient given discharge instruction and states an understanding.

## 2017-01-28 LAB — TYPE AND SCREEN
ABO/RH(D): O POS
Antibody Screen: NEGATIVE
Unit division: 0
Unit division: 0

## 2017-02-02 ENCOUNTER — Other Ambulatory Visit (HOSPITAL_BASED_OUTPATIENT_CLINIC_OR_DEPARTMENT_OTHER): Payer: Medicare Other

## 2017-02-02 ENCOUNTER — Ambulatory Visit (HOSPITAL_BASED_OUTPATIENT_CLINIC_OR_DEPARTMENT_OTHER): Payer: Medicare Other

## 2017-02-02 ENCOUNTER — Ambulatory Visit (HOSPITAL_BASED_OUTPATIENT_CLINIC_OR_DEPARTMENT_OTHER): Payer: Medicare Other | Admitting: Hematology

## 2017-02-02 ENCOUNTER — Encounter: Payer: Self-pay | Admitting: Hematology

## 2017-02-02 VITALS — BP 126/69 | HR 85 | Temp 98.0°F | Resp 18 | Ht 64.0 in | Wt 116.7 lb

## 2017-02-02 DIAGNOSIS — C8111 Nodular sclerosis classical Hodgkin lymphoma, lymph nodes of head, face, and neck: Secondary | ICD-10-CM

## 2017-02-02 DIAGNOSIS — D649 Anemia, unspecified: Secondary | ICD-10-CM

## 2017-02-02 DIAGNOSIS — D509 Iron deficiency anemia, unspecified: Secondary | ICD-10-CM | POA: Diagnosis not present

## 2017-02-02 DIAGNOSIS — Z95828 Presence of other vascular implants and grafts: Secondary | ICD-10-CM | POA: Insufficient documentation

## 2017-02-02 LAB — COMPREHENSIVE METABOLIC PANEL
ALT: 27 U/L (ref 0–55)
AST: 18 U/L (ref 5–34)
Albumin: 2.7 g/dL — ABNORMAL LOW (ref 3.5–5.0)
Alkaline Phosphatase: 123 U/L (ref 40–150)
Anion Gap: 10 mEq/L (ref 3–11)
BUN: 18.4 mg/dL (ref 7.0–26.0)
CO2: 26 mEq/L (ref 22–29)
Calcium: 9.5 mg/dL (ref 8.4–10.4)
Chloride: 101 mEq/L (ref 98–109)
Creatinine: 0.8 mg/dL (ref 0.6–1.1)
EGFR: 69 mL/min/{1.73_m2} — ABNORMAL LOW (ref >90)
Glucose: 80 mg/dl (ref 70–140)
Potassium: 3.9 mEq/L (ref 3.5–5.1)
Sodium: 137 mEq/L (ref 136–145)
Total Bilirubin: 0.41 mg/dL (ref 0.20–1.20)
Total Protein: 7.4 g/dL (ref 6.4–8.3)

## 2017-02-02 LAB — CBC & DIFF AND RETIC
BASO%: 0.4 % (ref 0.0–2.0)
Basophils Absolute: 0 10*3/uL (ref 0.0–0.1)
EOS%: 8.5 % — ABNORMAL HIGH (ref 0.0–7.0)
Eosinophils Absolute: 0.2 10*3/uL (ref 0.0–0.5)
HCT: 37.4 % (ref 34.8–46.6)
HGB: 11.8 g/dL (ref 11.6–15.9)
Immature Retic Fract: 1.2 % — ABNORMAL LOW (ref 1.60–10.00)
LYMPH%: 26.4 % (ref 14.0–49.7)
MCH: 24.2 pg — ABNORMAL LOW (ref 25.1–34.0)
MCHC: 31.6 g/dL (ref 31.5–36.0)
MCV: 76.6 fL — ABNORMAL LOW (ref 79.5–101.0)
MONO#: 0.4 10*3/uL (ref 0.1–0.9)
MONO%: 18.3 % — ABNORMAL HIGH (ref 0.0–14.0)
NEUT#: 1.1 10*3/uL — ABNORMAL LOW (ref 1.5–6.5)
NEUT%: 46.4 % (ref 38.4–76.8)
Platelets: 340 10*3/uL (ref 145–400)
RBC: 4.88 10*6/uL (ref 3.70–5.45)
RDW: 20.3 % — ABNORMAL HIGH (ref 11.2–14.5)
Retic %: <0.38 % — ABNORMAL LOW (ref 0.5–1.5)
Retic Ct Abs: 9.76 10*3/uL — ABNORMAL LOW (ref 33.70–90.70)
WBC: 2.4 10*3/uL — ABNORMAL LOW (ref 3.9–10.3)
lymph#: 0.6 10*3/uL — ABNORMAL LOW (ref 0.9–3.3)

## 2017-02-02 LAB — SEDIMENTATION RATE: Sedimentation Rate-Westergren: 72 mm/hr — ABNORMAL HIGH (ref 0–40)

## 2017-02-02 MED ORDER — HEPARIN SOD (PORK) LOCK FLUSH 100 UNIT/ML IV SOLN
500.0000 [IU] | Freq: Once | INTRAVENOUS | Status: AC | PRN
  Administered 2017-02-02: 500 [IU] via INTRAVENOUS
  Filled 2017-02-02: qty 5

## 2017-02-02 MED ORDER — SODIUM CHLORIDE 0.9% FLUSH
10.0000 mL | INTRAVENOUS | Status: DC | PRN
  Administered 2017-02-02: 10 mL via INTRAVENOUS
  Filled 2017-02-02: qty 10

## 2017-02-03 LAB — IRON AND TIBC
%SAT: 36 % (ref 21–57)
Iron: 72 ug/dL (ref 41–142)
TIBC: 200 ug/dL — ABNORMAL LOW (ref 236–444)
UIBC: 128 ug/dL (ref 120–384)

## 2017-02-03 LAB — FERRITIN: Ferritin: 1120 ng/ml — ABNORMAL HIGH (ref 9–269)

## 2017-02-07 NOTE — Progress Notes (Signed)
[name] Kitchen    HEMATOLOGY/ONCOLOGY CLINIC NOTE  Date of Service: .02/02/2017  Patient Care Team: Leeroy Cha, MD as PCP - General (Internal Medicine)  CHIEF COMPLAINTS/PURPOSE OF CONSULTATION:  Newly diagnosed Hodgkin's lymphoma  HISTORY OF PRESENTING ILLNESS:   Kim Lane is a wonderful 83 y.o. female who has been referred to [place] by Dr .Leeroy Cha, MD for evaluation and management of newly diagnosed Hodgkin's lymphoma.  Patient has a history of coronary artery disease status post PCI to LAD and diagonal in March 2007, hypertension, dyslipidemia, hypothyroidism, irritable bowel syndrome, suspected COPD, possible CVA in May 2017 (MRI showed chronic microvascular ischemic changes, no acute CVA - patient presented with slurred speech and confusion and facial droop which have resolved). Patient still lives in functions independently. Having some issues with balance but able to ambulate without a walker and cane at this time. No history of falls. She is accompanied by her son Kim Lane and daughter Kim Lane for her visit today.  Patient notes that since about May/June 2017 she has had some increased fatigue, decreased appetite, weight loss of 15-20 pounds, intermittent significant night sweats. Has felt chilly but no objective fevers. Has had intermittent skin rash over her trunk area and extremities which have been treated with topical steroids and short burst of oral prednisone by her dermatologist. She reports that she was also told by her doctor should she is getting somewhat more anemic.  In November/December 2017 she noted increased size of right lower neck lymph nodes which were evaluated by her primary care physician. She was empirically treated with amoxicillin with no resolution of her lymph nodes. She was referred for a lymph node biopsy and eventually had her right supraclavicular lymph node biopsy on 12/22/2016.  The results of her lymph node biopsy were consistent with  classical Hodgkin's lymphoma nodular sclerosis type.  She was referred to [place] for further evaluation and management. She has noted some other right neck lymph nodes but does not note any other overt lymphadenopathy. Notes that she feels fatigued and overall other unwell since this started in hopes we can help her. No staging imaging done yet. She was not aware of her diagnosis prior to the clinic visit today. I discussed her diagnosis, planned workup, broadly possible treatment options and other details of her condition and provided reading material to her and her accompanying daughter and son.  Patient still drives and lives independently. She had an echocardiogram in May 2017 with her cardiologist Dr. [name] which showed normal ejection fraction of 60-65% with mild left ventricular hypertrophy and no significant valvular abnormalities. She has had a few episodes of angina that have been treated with nitroglycerin.  She reports that she recently had a chest x-ray for bronchitis like symptoms on 11/25/2016 that showed somewhat hyperinflated lungs with subsegmental ectasis. No acute pneumonia.  She notes that she only smoked for about a year long time ago and had significant exposure to secondhand smoking from husband.  Currently still has a reddish non-raised rash over her back which is quite pruritic.  INTERVAL HISTORY  Patient is here for f/u after his C1D1 of AVD chemotherapy for a toxicity check. She notes no acute new symptoms. Notes increased energy probably from steroids and improvement in appetite. No issues with neulasta. No nausea/vomting. Somewhat loose stools a couple of times but does have OBS at baseline. Notes that she is functioning okay at home with family support but notes that she will hire help if needed. Feels much better  after PRBC transfusions.  MEDICAL HISTORY:  Past Medical History:  Diagnosis Date  . CAD (coronary artery disease) 02/2006   Taxus stent placed  to LAD and Diagonal per Dr. Olevia Perches  . HTN (hypertension)   . Hypercholesterolemia   . Hypothyroidism   . IBS (irritable bowel syndrome)   . Osteoarthritis   . Stroke [facility]) 03/2016  Osteoporosis Cardiologist Dr. [name] GI -Dr. Thana Farr  SURGICAL HISTORY: Past Surgical History:  Procedure Laterality Date  . APPENDECTOMY    . IR GENERIC HISTORICAL  01/25/2017   IR US GUIDE VASC ACCESS RIGHT 01/25/2017 [name], MD [facility]-INTER[facility] RAD  . IR GENERIC HISTORICAL  01/25/2017   IR FLUORO GUIDE PORT INSERTION RIGHT 01/25/2017 [name], MD [facility]  . MASS EXCISION Right 12/22/2016   Procedure: RIGHT NECK LYMPH NODE BIOPSY;  Surgeon: Rozetta Nunnery, MD;  Location: [place];  Service: ENT;  Laterality: Right;  . stents     in heart  . TOTAL KNEE ARTHROPLASTY Bilateral 2011,2012  . VAGINAL HYSTERECTOMY      SOCIAL HISTORY: Social History   Social History  . Marital status: Widowed    Spouse name: N/A  . Number of children: 2  . Years of education: 12   Occupational History  .  [employer]   Social History Main Topics  . Smoking status: Former Research scientist (life sciences)  . Smokeless tobacco: Never Used     Comment: quit smoking 30 years ago, very light smoker  . Alcohol use No  . Drug use: No  . Sexual activity: Not on file   Other Topics Concern  . Not on file   Social History Narrative   Lives alone   caffeine use- coffee- 5 cups daily    FAMILY HISTORY: Family History  Problem Relation Age of Onset  . Diabetes Mother   . Dementia Mother   . ALS Mother   . Heart Problems Father   . Liver disease Sister   . Cirrhosis Sister   . Heart block      CABG  . Diabetes Sister   . Heart block Son     CABG  . Other [place]  . Other Daughter     [place]  . Dementia Sister     ALLERGIES:  is allergic to fosamax [alendronate sodium].  MEDICATIONS:  Current Outpatient Prescriptions  Medication Sig Dispense  Refill  . amoxicillin (AMOXIL) 500 MG capsule 500 mg.    . aspirin 81 MG tablet Take 81 mg by mouth daily.      [name] Kitchen co-enzyme Q-10 30 MG capsule Take 30 mg by mouth daily.     [name] Kitchen dexamethasone (DECADRON) 4 MG tablet Take 1 tablet (4 mg total) by mouth daily. 14 tablet 0  . dexamethasone (DECADRON) 4 MG tablet Take 2 tablets by mouth once a day on the day after chemotherapy and then take 2 tablets two times a day for 2 days. Take with food. 30 tablet 1  . glucosamine-chondroitin 500-400 MG tablet Take 1 tablet by mouth daily as needed (for arthritis).     . iron polysaccharides (NIFEREX) 150 MG capsule Take 1 capsule (150 mg total) by mouth daily. 30 capsule 1  . levothyroxine (SYNTHROID, LEVOTHROID) 88 MCG tablet Take 88 mcg by mouth daily.    [name] Kitchen lidocaine-prilocaine (EMLA) cream Apply to affected area once 30 g 3  . metoprolol succinate (TOPROL-XL) 25 MG 24 hr  tablet Take 12.5 mg by mouth every other day.     . Multiple Vitamins-Minerals (CENTRUM SILVER PO) Take 1 tablet by mouth daily.      . nitroGLYCERIN (NITROLINGUAL) 0.4 MG/SPRAY spray Place 1 spray under the tongue every 5 (five) minutes x 3 doses as needed for chest pain (3 sprays max).    . Omega-3 Fatty Acids (FISH OIL) 1000 MG CAPS Take 1 capsule by mouth every other day.     . ondansetron (ZOFRAN) 8 MG tablet Take 1 tablet (8 mg total) by mouth 2 (two) times daily as needed. Start on the third day after chemotherapy. 30 tablet 1  . PROAIR HFA 108 (90 Base) MCG/ACT inhaler     . prochlorperazine (COMPAZINE) 10 MG tablet Take 1 tablet (10 mg total) by mouth every 6 (six) hours as needed (Nausea or vomiting). 30 tablet 1  . RESTASIS 0.05 % ophthalmic emulsion Place 2 drops into both eyes daily as needed. For dry eyes    . simvastatin (ZOCOR) 20 MG tablet Take 20 mg by mouth daily.    [name] Kitchen triamcinolone cream (KENALOG) 0.1 % Apply 1 application topically daily.      No current facility-administered medications for this visit.     REVIEW OF  SYSTEMS:    10 Point review of Systems was done is negative except as noted above.  PHYSICAL EXAMINATION: ECOG PERFORMANCE STATUS: 2 - Symptomatic, <50% confined to bed  . Vitals:   02/02/17 1336  BP: 126/69  Pulse: 85  Resp: 18  Temp: 98 F (36.7 C)   Filed Weights   02/02/17 1336  Weight: 116 lb 11.2 oz (52.9 kg)   .Body mass index is 20.03 kg/m.  GENERAL:alert, in no acute distress and comfortable SKIN: skin color, texture, turgor are normal, no rashes or significant lesions EYES: normal, conjunctiva are pink and non-injected, sclera clear OROPHARYNX:no exudate, no erythema and lips, buccal mucosa, and tongue normal  NECK: supple, no JVD, thyroid normal size, non-tender, without nodularity LYMPH:  no palpable lymphadenopathy in the cervical, axillary or inguinal LUNGS: clear to auscultation with normal respiratory effort HEART: regular rate & rhythm,  no murmurs and no lower extrety [place] ABDOMEN: abdomen soft, non-tender, normoactive bowel sounds  Musculoskeletal: no cyanosis of digits and no clubbing  PSYCH: alert & oriented x 3 with fluent speech NEURO: no focal motor/sensory deficits  LABORATORY DATA:  I have reviewed the data as listed  . CBC Latest Ref Rng & Units 02/02/2017 01/26/2017 01/25/2017  WBC 3.9 - 10.3 10e3/uL 2.4(L) 14.1(H) 13.7(H)  Hemoglobin 11.6 - 15.9 g/dL 11.8 8.1(L) 8.7(L)  Hematocrit 34.8 - 46.6 % 37.4 25.6(L) 27.4(L)  Platelets 145 - 400 10e3/uL 340 616(H) 637(H)   . CBC    Component Value Date/Time   WBC 2.4 (L) 02/02/2017 1249   WBC 13.7 (H) 01/25/2017 0807   RBC 4.88 02/02/2017 1249   RBC 3.84 (L) 01/25/2017 0807   HGB 11.8 02/02/2017 1249   HCT 37.4 02/02/2017 1249   PLT 340 02/02/2017 1249   MCV 76.6 (L) 02/02/2017 1249   MCH 24.2 (L) 02/02/2017 1249   MCH 22.7 (L) 01/25/2017 0807   MCHC 31.6 02/02/2017 1249   MCHC 31.8 01/25/2017 0807   RDW 20.3 (H) 02/02/2017 1249   LYMPHSABS 0.6 (L) 02/02/2017 1249   MONOABS 0.4  02/02/2017 1249   EOSABS 0.2 02/02/2017 1249   BASOSABS 0.0 02/02/2017 1249    . CMP Latest Ref Rng & Units 02/02/2017 01/26/2017 12/30/2016  Glucose 70 - 140 mg/dl 80 146(H) 233(H)  BUN 7.0 - 26.0 mg/dL 18.4 13.5 14.2  Creatinine 0.6 - 1.1 mg/dL 0.8 0.8 1.1  Sodium 136 - 145 mEq/L 137 134(L) 137  Potassium 3.5 - 5.1 mEq/L 3.9 4.0 3.7  Chloride 96 - 112 mEq/L - - -  CO2 22 - 29 mEq/L 26 25 26   Calcium 8.4 - 10.4 mg/dL 9.5 9.1 9.2  Total Protein 6.4 - 8.3 g/dL 7.4 7.5 7.9  Total Bilirubin 0.20 - 1.20 mg/dL 0.41 0.48 0.37  Alkaline Phos 40 - 150 U/L 123 126 124  AST 5 - 34 U/L 18 18 19   ALT 0 - 55 U/L 27 13 16    Component     Latest Ref Rng & Units 12/30/2016  LDH     125 - 245 U/L 185  Sed Rate     0 - 40 mm/hr 68 (H)  Hepatitis B Surface Ag     Negative Negative      RADIOGRAPHIC STUDIES: I have personally reviewed the radiological images as listed and agreed with the findings in the report. Nm Pet Image Initial (pi) Skull Base To Thigh  Result Date: 01/14/2017 CLINICAL DATA:  Initial treatment strategy for nodular sclerosis Hodgkin's lymphoma. Right neck biopsy December 23, 2016. EXAM: NUCLEAR MEDICINE PET SKULL BASE TO THIGH TECHNIQUE: 2.7 mCi F-18 FDG was injected intravenously. Full-ring PET imaging was performed from the skull base to thigh after the radiotracer. CT data was obtained and used for attenuation correction and anatomic localization. FASTING BLOOD GLUCOSE:  Value: 99 mg/dl COMPARISON:  MRI 04/26/2016 FINDINGS: NECK A right level Va lymph node measuring 0.9 cm in short axis on image 38 of series 3 has a maximum standard uptake value of 7.2. Additional clustered hypermetabolic supraclavicular lymph nodes are present bilaterally any more caudad location, level Vb. A right level IV lymph node at the thoracic inlet level has a short axis diameter of 2.0 cm on image 54/3 and a maximum standard uptake value of 14.2. CHEST Extensive paratracheal, prevascular, AP window,  bilateral hilar, bilateral infrahilar, and subcarinal adenopathy noted. An index right paratracheal node on image 69/ 3 measures 2.1 cm in short axis and has a maximum SUV of 19.9. An upper left subpectoral lymph node has a short axis diameter 1.2 cm on image 62/3 and a maximum SUV of 6.1. Faintly metabolic lower thoracic periaortic adenopathy noted. Background mediastinal blood pool activity 2.3. Coronary, aortic arch, and branch vessel atherosclerotic vascular disease. Trace right pleural effusion. There is considerable reticulonodular opacity inferiorly in the right upper lobe and in the right middle lobe and right lower lobe with associated airway thickening and some nodularity, but without a significant amount of metabolic activity. ABDOMEN/PELVIS Hypermetabolic gastrohepatic ligament, porta hepatis, periaortic, and external iliac adenopathy is present. An index gastrohepatic ligament lymph node measuring 1.6 cm in short axis diameter on image 130/3 has a maximum SUV of 11.6. A left periaortic node measuring 2.3 cm in short axis on image 145/ 3 has a maximum SUV of 5.4. Background liver activity SUV 2.8. Aortoiliac atherosclerotic vascular disease. No splenomegaly or splenic mass. SKELETON Subtle activity along the iliac side of the right sacroiliac joint corresponds with spurring and sclerosis and is probably inflammatory. IMPRESSION: 1. Hypermetabolic adenopathy in the lower neck, chest, and upper abdomen down to the level of the proximal common iliac chain as detailed above, compatible with primarily Deauville 5 lymphoma. Select lesions (not measured) would likely qualify as Deauville 4. No  splenomegaly or splenic mass. 2. Coronary, aortic arch, and branch vessel atherosclerotic vascular disease. Aortoiliac atherosclerotic vascular disease. 3. Considerable reticulonodular opacity inferiorly in the right upper lobe and in the right middle lobe and right lower lobe, not very hypermetabolic, suspicious for  atypical infectious process/bronchiolitis. This merits surveillance in course of the patient's therapy in order to ensure resolution. Electronically Signed   By: Van Clines M.D.   On: 01/14/2017 13:21   Ir US Guide Vasc Access Right  Result Date: 01/25/2017 CLINICAL DATA:  Hodgkin's lymphoma and need for porta cath to begin chemotherapy. EXAM: IMPLANTED PORT A CATH PLACEMENT WITH ULTRASOUND AND FLUOROSCOPIC GUIDANCE ANESTHESIA/SEDATION: 2.0 mg IV Versed; 100 mcg IV Fentanyl Total Moderate Sedation Time:  30 minutes The patient's level of consciousness and physiologic status were continuously monitored during the procedure by Radiology nursing. Additional Medications: 2 g IV Ancef. As antibiotic prophylaxis, Ancef was ordered pre-procedure and administered intravenously within one hour of incision. FLUOROSCOPY TIME:  18 seconds.  2.0 mGy. PROCEDURE: The procedure, risks, benefits, and alternatives were explained to the patient. Questions regarding the procedure were encouraged and answered. The patient understands and consents to the procedure. A time-out was performed prior to initiating the procedure. Ultrasound was utilized to confirm patency of the right internal jugular vein. The right neck and chest were prepped with chlorhexidine in a sterile fashion, and a sterile drape was applied covering the operative field. Maximum barrier sterile technique with sterile gowns and gloves were used for the procedure. Local anesthesia was provided with 1% lidocaine. After creating a small venotomy incision, a 21 gauge needle was advanced into the right internal jugular vein under direct, real-time ultrasound guidance. Ultrasound image documentation was performed. After securing guidewire access, an 8 Fr dilator was placed. A J-wire was kinked to measure appropriate catheter length. A subcutaneous port pocket was then created along the upper chest wall utilizing sharp and blunt dissection. Portable cautery was  utilized. The pocket was irrigated with sterile saline. A single lumen power injectable port was chosen for placement. The 8 Fr catheter was tunneled from the port pocket site to the venotomy incision. The port was placed in the pocket. External catheter was trimmed to appropriate length based on guidewire measurement. At the venotomy, an 8 Fr peel-away sheath was placed over a guidewire. The catheter was then placed through the sheath and the sheath removed. Final catheter positioning was confirmed and documented with a fluoroscopic spot image. The port was accessed with a needle and aspirated and flushed with heparinized saline. The access needle was removed. The venotomy and port pocket incisions were closed with subcutaneous 3-0 Monocryl and subcuticular 4-0 Vicryl. Dermabond was applied to both incisions. COMPLICATIONS: COMPLICATIONS None FINDINGS: After catheter placement, the tip lies at the cavo-atrial junction. The catheter aspirates normally and is ready for immediate use. IMPRESSION: Placement of single lumen port a cath via right internal jugular vein. The catheter tip lies at the cavo-atrial junction. A power injectable port a cath was placed and is ready for immediate use. Electronically Signed   By: [name] M.D.   On: 01/25/2017 15:29   Ir Fluoro Guide Port Insertion Right  Result Date: 01/25/2017 CLINICAL DATA:  Hodgkin's lymphoma and need for porta cath to begin chemotherapy. EXAM: IMPLANTED PORT A CATH PLACEMENT WITH ULTRASOUND AND FLUOROSCOPIC GUIDANCE ANESTHESIA/SEDATION: 2.0 mg IV Versed; 100 mcg IV Fentanyl Total Moderate Sedation Time:  30 minutes The patient's level of consciousness and physiologic status were continuously monitored during  the procedure by Radiology nursing. Additional Medications: 2 g IV Ancef. As antibiotic prophylaxis, Ancef was ordered pre-procedure and administered intravenously within one hour of incision. FLUOROSCOPY TIME:  18 seconds.  2.0 mGy. PROCEDURE:  The procedure, risks, benefits, and alternatives were explained to the patient. Questions regarding the procedure were encouraged and answered. The patient understands and consents to the procedure. A time-out was performed prior to initiating the procedure. Ultrasound was utilized to confirm patency of the right internal jugular vein. The right neck and chest were prepped with chlorhexidine in a sterile fashion, and a sterile drape was applied covering the operative field. Maximum barrier sterile technique with sterile gowns and gloves were used for the procedure. Local anesthesia was provided with 1% lidocaine. After creating a small venotomy incision, a 21 gauge needle was advanced into the right internal jugular vein under direct, real-time ultrasound guidance. Ultrasound image documentation was performed. After securing guidewire access, an 8 Fr dilator was placed. A J-wire was kinked to measure appropriate catheter length. A subcutaneous port pocket was then created along the upper chest wall utilizing sharp and blunt dissection. Portable cautery was utilized. The pocket was irrigated with sterile saline. A single lumen power injectable port was chosen for placement. The 8 Fr catheter was tunneled from the port pocket site to the venotomy incision. The port was placed in the pocket. External catheter was trimmed to appropriate length based on guidewire measurement. At the venotomy, an 8 Fr peel-away sheath was placed over a guidewire. The catheter was then placed through the sheath and the sheath removed. Final catheter positioning was confirmed and documented with a fluoroscopic spot image. The port was accessed with a needle and aspirated and flushed with heparinized saline. The access needle was removed. The venotomy and port pocket incisions were closed with subcutaneous 3-0 Monocryl and subcuticular 4-0 Vicryl. Dermabond was applied to both incisions. COMPLICATIONS: COMPLICATIONS None FINDINGS: After  catheter placement, the tip lies at the cavo-atrial junction. The catheter aspirates normally and is ready for immediate use. IMPRESSION: Placement of single lumen port a cath via right internal jugular vein. The catheter tip lies at the cavo-atrial junction. A power injectable port a cath was placed and is ready for immediate use. Electronically Signed   By: [name] M.D.   On: 01/25/2017 15:29   Transthoracic Echocardiography  Patient:    Kim, Lane MR #:       [id number] Study Date: 06/11/2016 Gender:     F Age:        68 Height:     162.6 cm Weight:     62 kg BSA:        1.68 m^2 Pt. Status: Room:   Jetty Duhamel, M.D.  REFERRING    [name], M.D.  ATTENDING    Loralie Champagne, M.D.  PERFORMING   [facility], Outpatient  SONOGRAPHER  Bethany McMahill, RDCS  cc:  ------------------------------------------------------------------- LV EF: 60% -   65%  ------------------------------------------------------------------- Indications:      TIA (G45.9).  ------------------------------------------------------------------- History:   PMH:   Coronary artery disease.  Stroke.  Risk factors: Family history of coronary artery disease. Former tobacco use. Hypertension. Dyslipidemia.  ------------------------------------------------------------------- Study Conclusions  - Left ventricle: The cavity size was normal. Wall thickness was   increased in a pattern of mild LVH. Systolic function was normal.   The estimated ejection fraction was in the range of 60% to 65%.   Wall motion was normal; there  were no regional wall motion   abnormalities. Doppler parameters are consistent with abnormal   left ventricular relaxation (grade 1 diastolic dysfunction).   Normal strain pattern, GLS -22.7%. - Aortic valve: There was no stenosis. - Mitral valve: Mildly calcified annulus. There was no significant   regurgitation. - Left atrium: The atrium was mildly dilated. -  Right ventricle: The cavity size was normal. Systolic function   was normal. - Pulmonary arteries: PA peak pressure: 19 mm Hg (S). - Inferior vena cava: The vessel was normal in size. The   respirophasic diameter changes were in the normal range (= 50%),   consistent with normal central venous pressure.  Impressions:  - Normal LV size and systolic function, EF [id number]. Mild LV   hypertrophy. Normal RV size and systolic function. No significant   valvular abnormalities.   ASSESSMENT & PLAN:   83 year old Caucasian female with ECOG performance status of 2 with  1) Newly diagnosed classical Hodgkin's lymphoma of nodular sclerosis variety . Atleast Stage IIIB some concern for possible Rt lung involvement which make in Stage IVB Presented with right neck lymph nodes . She has type B constitutional symptoms with 15-20 pound weight loss night sweats or chills  Also noted to have significant pruritus with mild rash likely from Hodgkin's   echo in the last 6 months in May 2017 which showed normal ejection fraction. No new cardiac symptomatology since then. 2) Microcytic anemia - likely anemia of chronic disease due to Hodgkin's lymphoma .  No evidence of iron def. Improved after PRBC transfusion . PLAN - labs stable. No prohibitive toxicities -continue AVD with G-CSF support. -infection prevention strategies discussed.  -she had several other questions regarding post-chemotherapy cares which were answered in details.  3) . Patient Active Problem List   Diagnosis Date Noted  . Port catheter in place 02/02/2017  . Nodular sclerosis Hodgkin lymphoma of lymph nodes of neck () 01/13/2017  . Dyspnea 09/14/2012  . Epistaxis 06/02/2011  . HYPERCHOLESTEROLEMIA, MIXED 01/08/2009  . ESSENTIAL HYPERTENSION, BENIGN 01/08/2009  . Coronary atherosclerosis 01/08/2009  . HYPOTHYROIDISM, HX OF 01/08/2009  PLAN -Continue follow-up with primary care physician for management of other chronic medical  co-morbidities   continue chemotherapy as per schedule. -RTC with Dr [name] C2D1 with lab -continue labs q2week with chemotherapy   All of the patients questions were answered with apparent satisfaction. The patient knows to call the clinic with any problems, questions or concerns.  I spent 20 minutes counseling the patient face to face. The total time spent in the appointment was 25 minutes and more than 50% was on counseling and direct patient cares.    Sullivan Lone MD [place] AAHIVMS [facility] Hematology/Oncology Physician [facility]  (Office):       [phone] (Work cell):  [phone] (Fax):           [phone]

## 2017-02-08 ENCOUNTER — Telehealth: Payer: Self-pay | Admitting: Hematology

## 2017-02-08 NOTE — Telephone Encounter (Signed)
Patient called to have injection appointment canceled. Per the patient, she is no longer required to have her injections on the following day she has something placed on her stomach.

## 2017-02-09 ENCOUNTER — Ambulatory Visit (HOSPITAL_BASED_OUTPATIENT_CLINIC_OR_DEPARTMENT_OTHER): Payer: Medicare Other

## 2017-02-09 ENCOUNTER — Other Ambulatory Visit (HOSPITAL_BASED_OUTPATIENT_CLINIC_OR_DEPARTMENT_OTHER): Payer: Medicare Other

## 2017-02-09 ENCOUNTER — Ambulatory Visit: Payer: Medicare Other

## 2017-02-09 VITALS — BP 136/66 | HR 68 | Temp 97.8°F

## 2017-02-09 DIAGNOSIS — C8111 Nodular sclerosis classical Hodgkin lymphoma, lymph nodes of head, face, and neck: Secondary | ICD-10-CM

## 2017-02-09 DIAGNOSIS — Z5111 Encounter for antineoplastic chemotherapy: Secondary | ICD-10-CM

## 2017-02-09 LAB — CBC WITH DIFFERENTIAL/PLATELET
BASO%: 0.2 % (ref 0.0–2.0)
Basophils Absolute: 0.1 10*3/uL (ref 0.0–0.1)
EOS%: 0.9 % (ref 0.0–7.0)
Eosinophils Absolute: 0.4 10*3/uL (ref 0.0–0.5)
HCT: 36.1 % (ref 34.8–46.6)
HGB: 11.6 g/dL (ref 11.6–15.9)
LYMPH%: 5.2 % — ABNORMAL LOW (ref 14.0–49.7)
MCH: 24.3 pg — ABNORMAL LOW (ref 25.1–34.0)
MCHC: 32.1 g/dL (ref 31.5–36.0)
MCV: 75.6 fL — ABNORMAL LOW (ref 79.5–101.0)
MONO#: 0.4 10*3/uL (ref 0.1–0.9)
MONO%: 0.8 % (ref 0.0–14.0)
NEUT#: 44.5 10*3/uL — ABNORMAL HIGH (ref 1.5–6.5)
NEUT%: 92.9 % — ABNORMAL HIGH (ref 38.4–76.8)
Platelets: 446 10*3/uL — ABNORMAL HIGH (ref 145–400)
RBC: 4.77 10*6/uL (ref 3.70–5.45)
RDW: 22.9 % — ABNORMAL HIGH (ref 11.2–14.5)
WBC: 47.9 10*3/uL — ABNORMAL HIGH (ref 3.9–10.3)
lymph#: 2.5 10*3/uL (ref 0.9–3.3)

## 2017-02-09 LAB — COMPREHENSIVE METABOLIC PANEL
ALT: 26 U/L (ref 0–55)
AST: 25 U/L (ref 5–34)
Albumin: 3.2 g/dL — ABNORMAL LOW (ref 3.5–5.0)
Alkaline Phosphatase: 174 U/L — ABNORMAL HIGH (ref 40–150)
Anion Gap: 12 mEq/L — ABNORMAL HIGH (ref 3–11)
BUN: 16.7 mg/dL (ref 7.0–26.0)
CO2: 25 mEq/L (ref 22–29)
Calcium: 9.7 mg/dL (ref 8.4–10.4)
Chloride: 103 mEq/L (ref 98–109)
Creatinine: 0.9 mg/dL (ref 0.6–1.1)
EGFR: 64 mL/min/{1.73_m2} — ABNORMAL LOW (ref >90)
Glucose: 90 mg/dl (ref 70–140)
Potassium: 4 mEq/L (ref 3.5–5.1)
Sodium: 140 mEq/L (ref 136–145)
Total Bilirubin: 0.28 mg/dL (ref 0.20–1.20)
Total Protein: 7.4 g/dL (ref 6.4–8.3)

## 2017-02-09 MED ORDER — SODIUM CHLORIDE 0.9 % IV SOLN
Freq: Once | INTRAVENOUS | Status: AC
  Administered 2017-02-09: 10:00:00 via INTRAVENOUS
  Filled 2017-02-09: qty 5

## 2017-02-09 MED ORDER — PALONOSETRON HCL INJECTION 0.25 MG/5ML
0.2500 mg | Freq: Once | INTRAVENOUS | Status: AC
  Administered 2017-02-09: 0.25 mg via INTRAVENOUS

## 2017-02-09 MED ORDER — PALONOSETRON HCL INJECTION 0.25 MG/5ML
INTRAVENOUS | Status: AC
  Filled 2017-02-09: qty 5

## 2017-02-09 MED ORDER — SODIUM CHLORIDE 0.9 % IV SOLN
Freq: Once | INTRAVENOUS | Status: AC
  Administered 2017-02-09: 09:00:00 via INTRAVENOUS

## 2017-02-09 MED ORDER — DOXORUBICIN HCL CHEMO IV INJECTION 2 MG/ML
25.0000 mg/m2 | Freq: Once | INTRAVENOUS | Status: AC
  Administered 2017-02-09: 40 mg via INTRAVENOUS
  Filled 2017-02-09: qty 20

## 2017-02-09 MED ORDER — SODIUM CHLORIDE 0.9% FLUSH
10.0000 mL | INTRAVENOUS | Status: DC | PRN
  Administered 2017-02-09: 10 mL
  Filled 2017-02-09: qty 10

## 2017-02-09 MED ORDER — VINBLASTINE SULFATE CHEMO INJECTION 1 MG/ML
6.4500 mg/m2 | Freq: Once | INTRAVENOUS | Status: AC
  Administered 2017-02-09: 10 mg via INTRAVENOUS
  Filled 2017-02-09: qty 10

## 2017-02-09 MED ORDER — SODIUM CHLORIDE 0.9 % IV SOLN
375.0000 mg/m2 | Freq: Once | INTRAVENOUS | Status: AC
  Administered 2017-02-09: 600 mg via INTRAVENOUS
  Filled 2017-02-09: qty 30

## 2017-02-09 MED ORDER — HEPARIN SOD (PORK) LOCK FLUSH 100 UNIT/ML IV SOLN
500.0000 [IU] | Freq: Once | INTRAVENOUS | Status: AC | PRN
  Administered 2017-02-09: 500 [IU]
  Filled 2017-02-09: qty 5

## 2017-02-09 MED ORDER — VINBLASTINE SULFATE CHEMO INJECTION 1 MG/ML: 6.0000 mg/m2 | Freq: Once | INTRAVENOUS | Status: DC

## 2017-02-09 MED ORDER — PEGFILGRASTIM 6 MG/0.6ML ~~LOC~~ PSKT
6.0000 mg | PREFILLED_SYRINGE | Freq: Once | SUBCUTANEOUS | Status: AC
  Administered 2017-02-09: 6 mg via SUBCUTANEOUS
  Filled 2017-02-09: qty 0.6

## 2017-02-09 NOTE — Progress Notes (Signed)
Nutrition Follow-up:   Nutrition follow-up completed in infusion this am.  Patient receiving chemotherapy for hodgkin's lymphoma.  She is a patient of Dr. [name].  Patient reports "my appetite is better but I didn't gain any weight."  "I feel hungry now as before I just really did not want anything to eat."  Patient reports this am woke up at 3am and ate bowl of cereal then went back to bed.  Ate toast with peanut butter before coming to infusion.  Reports last night for dinner had grilled chicken and tossed salad.  For lunch yesterday ate 1/2 hot dog.  Reports she has been trying to eat every 2-3 hours but just can't do it, still trying to eat frequently throughout the day.  Reports that she is drinking boost but can't remember if it is boost original. Recently purchased premier protein and likes that.   Medications: reviewed  Labs: reviewed  Anthropometrics:   Patient reports she weighed at home this am and was 115 pounds.  Last measured weight in clinic was 116 pounds 11.2 oz on 2/13 decreased from 120.7 lb on 2/6.     NUTRITION DIAGNOSIS: Unintentional weight loss continues   MALNUTRITION DIAGNOSIS: Severe malnutrition continues   INTERVENTION:   Reviewed importance of choosing oral nutrition supplements with increased calories to promote weight gain.  Examples provided and written on handout and coupons given to patient.   Reviewed strategies to increase calories and protein for weight gain.  Handout provided and teach back used.       MONITORING, EVALUATION, GOAL: Patient will tolerate increased calories and protein to minimize further weight loss and tolerate treatment.   NEXT VISIT: March 20 during infusion  Dawson Albers B. Zenia Resides, [place], [place] (pager)

## 2017-02-09 NOTE — Patient Instructions (Signed)
[facility] Discharge Instructions for Patients Receiving Chemotherapy  Today you received the following chemotherapy agents Adriamycin, Vinblastine and DTIC  To help prevent nausea and vomiting after your treatment, we encourage you to take your nausea medication as directed. No Zofran for 3 days. Take Compazine instead.    If you develop nausea and vomiting that is not controlled by your nausea medication, call the clinic.   BELOW ARE SYMPTOMS THAT SHOULD BE REPORTED IMMEDIATELY:  *FEVER GREATER THAN 100.5 F  *CHILLS WITH OR WITHOUT FEVER  NAUSEA AND VOMITING THAT IS NOT CONTROLLED WITH YOUR NAUSEA MEDICATION  *UNUSUAL SHORTNESS OF BREATH  *UNUSUAL BRUISING OR BLEEDING  TENDERNESS IN MOUTH AND THROAT WITH OR WITHOUT PRESENCE OF ULCERS  *URINARY PROBLEMS  *BOWEL PROBLEMS  UNUSUAL RASH Items with * indicate a potential emergency and should be followed up as soon as possible.  Feel free to call the clinic you have any questions or concerns. The clinic phone number is [phone].  Please show the CHEMO ALERT CARD at check-in to the Emergency Department and triage nurse.   

## 2017-02-10 ENCOUNTER — Ambulatory Visit: Payer: Medicare Other

## 2017-02-10 ENCOUNTER — Telehealth: Payer: Self-pay | Admitting: Hematology

## 2017-02-10 NOTE — Telephone Encounter (Signed)
Patient called today re chemo 3/6. chemo added for after 3/6 after f/u - added per care plan.

## 2017-02-18 ENCOUNTER — Encounter: Payer: Self-pay | Admitting: *Deleted

## 2017-02-18 NOTE — Progress Notes (Signed)
QI encounter 

## 2017-02-23 ENCOUNTER — Encounter: Payer: Self-pay | Admitting: Hematology

## 2017-02-23 ENCOUNTER — Ambulatory Visit (HOSPITAL_BASED_OUTPATIENT_CLINIC_OR_DEPARTMENT_OTHER): Payer: Medicare Other

## 2017-02-23 ENCOUNTER — Other Ambulatory Visit (HOSPITAL_BASED_OUTPATIENT_CLINIC_OR_DEPARTMENT_OTHER): Payer: Medicare Other

## 2017-02-23 ENCOUNTER — Ambulatory Visit (HOSPITAL_BASED_OUTPATIENT_CLINIC_OR_DEPARTMENT_OTHER): Payer: Medicare Other | Admitting: Hematology

## 2017-02-23 ENCOUNTER — Ambulatory Visit: Payer: Medicare Other

## 2017-02-23 VITALS — BP 135/60 | HR 73 | Temp 97.9°F | Resp 18 | Wt 119.8 lb

## 2017-02-23 DIAGNOSIS — C8111 Nodular sclerosis classical Hodgkin lymphoma, lymph nodes of head, face, and neck: Secondary | ICD-10-CM

## 2017-02-23 DIAGNOSIS — D509 Iron deficiency anemia, unspecified: Secondary | ICD-10-CM | POA: Diagnosis not present

## 2017-02-23 DIAGNOSIS — Z5111 Encounter for antineoplastic chemotherapy: Secondary | ICD-10-CM

## 2017-02-23 DIAGNOSIS — D72829 Elevated white blood cell count, unspecified: Secondary | ICD-10-CM

## 2017-02-23 DIAGNOSIS — D649 Anemia, unspecified: Secondary | ICD-10-CM

## 2017-02-23 LAB — COMPREHENSIVE METABOLIC PANEL
ALT: 18 U/L (ref 0–55)
AST: 21 U/L (ref 5–34)
Albumin: 3.4 g/dL — ABNORMAL LOW (ref 3.5–5.0)
Alkaline Phosphatase: 161 U/L — ABNORMAL HIGH (ref 40–150)
Anion Gap: 7 mEq/L (ref 3–11)
BUN: 11.2 mg/dL (ref 7.0–26.0)
CO2: 26 mEq/L (ref 22–29)
Calcium: 8.9 mg/dL (ref 8.4–10.4)
Chloride: 107 mEq/L (ref 98–109)
Creatinine: 0.9 mg/dL (ref 0.6–1.1)
EGFR: 58 mL/min/{1.73_m2} — ABNORMAL LOW (ref >90)
Glucose: 100 mg/dl (ref 70–140)
Potassium: 4 mEq/L (ref 3.5–5.1)
Sodium: 140 mEq/L (ref 136–145)
Total Bilirubin: 0.26 mg/dL (ref 0.20–1.20)
Total Protein: 6.6 g/dL (ref 6.4–8.3)

## 2017-02-23 LAB — CBC & DIFF AND RETIC
BASO%: 1.5 % (ref 0.0–2.0)
Basophils Absolute: 0.5 10*3/uL — ABNORMAL HIGH (ref 0.0–0.1)
EOS%: 2.7 % (ref 0.0–7.0)
Eosinophils Absolute: 0.9 10*3/uL — ABNORMAL HIGH (ref 0.0–0.5)
HCT: 33.7 % — ABNORMAL LOW (ref 34.8–46.6)
HGB: 10.7 g/dL — ABNORMAL LOW (ref 11.6–15.9)
Immature Retic Fract: 10.7 % — ABNORMAL HIGH (ref 1.60–10.00)
LYMPH%: 5.8 % — ABNORMAL LOW (ref 14.0–49.7)
MCH: 24.7 pg — ABNORMAL LOW (ref 25.1–34.0)
MCHC: 31.8 g/dL (ref 31.5–36.0)
MCV: 77.8 fL — ABNORMAL LOW (ref 79.5–101.0)
MONO#: 1.6 10*3/uL — ABNORMAL HIGH (ref 0.1–0.9)
MONO%: 4.9 % (ref 0.0–14.0)
NEUT#: 28.5 10*3/uL — ABNORMAL HIGH (ref 1.5–6.5)
NEUT%: 85.1 % — ABNORMAL HIGH (ref 38.4–76.8)
Platelets: 409 10*3/uL — ABNORMAL HIGH (ref 145–400)
RBC: 4.33 10*6/uL (ref 3.70–5.45)
RDW: 24.8 % — ABNORMAL HIGH (ref 11.2–14.5)
Retic %: 1.37 % (ref 0.70–2.10)
Retic Ct Abs: 59.32 10*3/uL (ref 33.70–90.70)
WBC: 33.5 10*3/uL — ABNORMAL HIGH (ref 3.9–10.3)
lymph#: 2 10*3/uL (ref 0.9–3.3)

## 2017-02-23 MED ORDER — PALONOSETRON HCL INJECTION 0.25 MG/5ML
0.2500 mg | Freq: Once | INTRAVENOUS | Status: AC
  Administered 2017-02-23: 0.25 mg via INTRAVENOUS

## 2017-02-23 MED ORDER — SODIUM CHLORIDE 0.9 % IV SOLN
Freq: Once | INTRAVENOUS | Status: AC
  Administered 2017-02-23: 15:00:00 via INTRAVENOUS
  Filled 2017-02-23: qty 5

## 2017-02-23 MED ORDER — SODIUM CHLORIDE 0.9 % IV SOLN
375.0000 mg/m2 | Freq: Once | INTRAVENOUS | Status: AC
  Administered 2017-02-23: 600 mg via INTRAVENOUS
  Filled 2017-02-23: qty 30

## 2017-02-23 MED ORDER — DOXORUBICIN HCL CHEMO IV INJECTION 2 MG/ML
25.0000 mg/m2 | Freq: Once | INTRAVENOUS | Status: AC
  Administered 2017-02-23: 40 mg via INTRAVENOUS
  Filled 2017-02-23: qty 20

## 2017-02-23 MED ORDER — SODIUM CHLORIDE 0.9 % IV SOLN
Freq: Once | INTRAVENOUS | Status: AC
  Administered 2017-02-23: 15:00:00 via INTRAVENOUS

## 2017-02-23 MED ORDER — HEPARIN SOD (PORK) LOCK FLUSH 100 UNIT/ML IV SOLN
500.0000 [IU] | Freq: Once | INTRAVENOUS | Status: AC | PRN
  Administered 2017-02-23: 500 [IU]
  Filled 2017-02-23: qty 5

## 2017-02-23 MED ORDER — SODIUM CHLORIDE 0.9% FLUSH
10.0000 mL | INTRAVENOUS | Status: DC | PRN
  Administered 2017-02-23: 10 mL
  Filled 2017-02-23: qty 10

## 2017-02-23 MED ORDER — PEGFILGRASTIM 6 MG/0.6ML ~~LOC~~ PSKT
6.0000 mg | PREFILLED_SYRINGE | Freq: Once | SUBCUTANEOUS | Status: AC
  Administered 2017-02-23: 6 mg via SUBCUTANEOUS
  Filled 2017-02-23: qty 0.6

## 2017-02-23 MED ORDER — VINBLASTINE SULFATE CHEMO INJECTION 1 MG/ML
6.3000 mg/m2 | Freq: Once | INTRAVENOUS | Status: AC
  Administered 2017-02-23: 10 mg via INTRAVENOUS
  Filled 2017-02-23: qty 10

## 2017-02-23 NOTE — Progress Notes (Signed)
[name] Kitchen    HEMATOLOGY/ONCOLOGY CLINIC NOTE  Date of Service: .02/23/2017  Patient Care Team: Leeroy Cha, MD as PCP - General (Internal Medicine)  CHIEF COMPLAINTS/PURPOSE OF CONSULTATION:   f/u for continued treatment of Hodgkin's lymphoma  HISTORY OF PRESENTING ILLNESS:   Kim Lane is a wonderful 83 y.o. female who has been referred to [place] by Dr .Leeroy Cha, MD for evaluation and management of newly diagnosed Hodgkin's lymphoma.  Patient has a history of coronary artery disease status post PCI to LAD and diagonal in March 2007, hypertension, dyslipidemia, hypothyroidism, irritable bowel syndrome, suspected COPD, possible CVA in May 2017 (MRI showed chronic microvascular ischemic changes, no acute CVA - patient presented with slurred speech and confusion and facial droop which have resolved). Patient still lives in functions independently. Having some issues with balance but able to ambulate without a walker and cane at this time. No history of falls. She is accompanied by her son Konrad Dolores and daughter [name] for her visit today.  Patient notes that since about May/June 2017 she has had some increased fatigue, decreased appetite, weight loss of 15-20 pounds, intermittent significant night sweats. Has felt chilly but no objective fevers. Has had intermittent skin rash over her trunk area and extremities which have been treated with topical steroids and short burst of oral prednisone by her dermatologist. She reports that she was also told by her doctor should she is getting somewhat more anemic.  In November/December 2017 she noted increased size of right lower neck lymph nodes which were evaluated by her primary care physician. She was empirically treated with amoxicillin with no resolution of her lymph nodes. She was referred for a lymph node biopsy and eventually had her right supraclavicular lymph node biopsy on 12/22/2016.  The results of her lymph node biopsy were  consistent with classical Hodgkin's lymphoma nodular sclerosis type.  She was referred to [place] for further evaluation and management. She has noted some other right neck lymph nodes but does not note any other overt lymphadenopathy. Notes that she feels fatigued and overall other unwell since this started in hopes we can help her. No staging imaging done yet. She was not aware of her diagnosis prior to the clinic visit today. I discussed her diagnosis, planned workup, broadly possible treatment options and other details of her condition and provided reading material to her and her accompanying daughter and son.  Patient still drives and lives independently. She had an echocardiogram in May 2017 with her cardiologist Dr. [name] which showed normal ejection fraction of 60-65% with mild left ventricular hypertrophy and no significant valvular abnormalities. She has had a few episodes of angina that have been treated with nitroglycerin.  She reports that she recently had a chest x-ray for bronchitis like symptoms on 11/25/2016 that showed somewhat hyperinflated lungs with subsegmental ectasis. No acute pneumonia.  She notes that she only smoked for about a year long time ago and had significant exposure to secondhand smoking from husband.  Currently still has a reddish non-raised rash over her back which is quite pruritic.  INTERVAL HISTORY  Patient is here for f/u after his C2D1 of AVD chemotherapy. She notes no acute new symptoms. Notes no nausea/vomiting. Eating well. Some grade 1 fatigue . Encouraged her to use her walker and cane for ambulation to reduce risk of fall. No further pruritus/skin rashes or night sweats. Notes some constipation that has resolved with Miralax. Notes that she is functioning okay at home with family support but  notes that she will hire help if needed. No fevers/chills or other focal symptoms suggestive of an infection.  MEDICAL HISTORY:  Past Medical History:    Diagnosis Date  . CAD (coronary artery disease) 02/2006   Taxus stent placed to LAD and Diagonal per Dr. Olevia Perches  . HTN (hypertension)   . Hypercholesterolemia   . Hypothyroidism   . IBS (irritable bowel syndrome)   . Osteoarthritis   . Stroke [facility]) 03/2016  Osteoporosis Cardiologist Dr. [name] GI -Dr. Thana Farr  SURGICAL HISTORY: Past Surgical History:  Procedure Laterality Date  . APPENDECTOMY    . IR GENERIC HISTORICAL  01/25/2017   IR US GUIDE VASC ACCESS RIGHT 01/25/2017 [name], MD [facility]-INTERV RAD  . IR GENERIC HISTORICAL  01/25/2017   IR FLUORO GUIDE PORT INSERTION RIGHT 01/25/2017 [name], MD [facility]-INTERV RAD  . MASS EXCISION Right 12/22/2016   Procedure: RIGHT NECK LYMPH NODE BIOPSY;  Surgeon: Rozetta Nunnery, MD;  Location: [facility];  Service: ENT;  Laterality: Right;  . stents     in heart  . TOTAL KNEE ARTHROPLASTY Bilateral 2011,2012  . VAGINAL HYSTERECTOMY      SOCIAL HISTORY: Social History   Social History  . Marital status: Widowed    Spouse name: N/A  . Number of children: 2  . Years of education: 12   Occupational History  .  [employer]   Social History Main Topics  . Smoking status: Former Research scientist (life sciences)  . Smokeless tobacco: Never Used     Comment: quit smoking 30 years ago, very light smoker  . Alcohol use No  . Drug use: No  . Sexual activity: Not on file   Other Topics Concern  . Not on file   Social History Narrative   Lives alone   caffeine use- coffee- 5 cups daily    FAMILY HISTORY: Family History  Problem Relation Age of Onset  . Diabetes Mother   . Dementia Mother   . ALS Mother   . Heart Problems Father   . Liver disease Sister   . Cirrhosis Sister   . Heart block      CABG  . Diabetes Sister   . Heart block Son     CABG  . Other [place]  . Other Daughter     [place]  . Dementia Sister     ALLERGIES:  is allergic to fosamax [alendronate  sodium].  MEDICATIONS:  Current Outpatient Prescriptions  Medication Sig Dispense Refill  . amoxicillin (AMOXIL) 500 MG capsule 500 mg.    . aspirin 81 MG tablet Take 81 mg by mouth daily.      [name] Kitchen co-enzyme Q-10 30 MG capsule Take 30 mg by mouth daily.     [name] Kitchen dexamethasone (DECADRON) 4 MG tablet Take 1 tablet (4 mg total) by mouth daily. 14 tablet 0  . dexamethasone (DECADRON) 4 MG tablet Take 2 tablets by mouth once a day on the day after chemotherapy and then take 2 tablets two times a day for 2 days. Take with food. 30 tablet 1  . glucosamine-chondroitin 500-400 MG tablet Take 1 tablet by mouth daily as needed (for arthritis).     . iron polysaccharides (NIFEREX) 150 MG capsule Take 1 capsule (150 mg total) by mouth daily. 30 capsule 1  . levothyroxine (SYNTHROID, LEVOTHROID) 88 MCG tablet Take 88 mcg by mouth daily.    [name] Kitchen lidocaine-prilocaine (EMLA) cream Apply  to affected area once 30 g 3  . metoprolol succinate (TOPROL-XL) 25 MG 24 hr tablet Take 12.5 mg by mouth every other day.     . Multiple Vitamins-Minerals (CENTRUM SILVER PO) Take 1 tablet by mouth daily.      . nitroGLYCERIN (NITROLINGUAL) 0.4 MG/SPRAY spray Place 1 spray under the tongue every 5 (five) minutes x 3 doses as needed for chest pain (3 sprays max).    . Omega-3 Fatty Acids (FISH OIL) 1000 MG CAPS Take 1 capsule by mouth every other day.     . ondansetron (ZOFRAN) 8 MG tablet Take 1 tablet (8 mg total) by mouth 2 (two) times daily as needed. Start on the third day after chemotherapy. 30 tablet 1  . PROAIR HFA 108 (90 Base) MCG/ACT inhaler     . prochlorperazine (COMPAZINE) 10 MG tablet Take 1 tablet (10 mg total) by mouth every 6 (six) hours as needed (Nausea or vomiting). 30 tablet 1  . RESTASIS 0.05 % ophthalmic emulsion Place 2 drops into both eyes daily as needed. For dry eyes    . simvastatin (ZOCOR) 20 MG tablet Take 20 mg by mouth daily.    [name] Kitchen triamcinolone cream (KENALOG) 0.1 % Apply 1 application topically daily.       No current facility-administered medications for this visit.     REVIEW OF SYSTEMS:    10 Point review of Systems was done is negative except as noted above.  PHYSICAL EXAMINATION: ECOG PERFORMANCE STATUS: 2 - Symptomatic, <50% confined to bed  . Vitals:   02/23/17 1342  BP: 135/60  Pulse: 73  Resp: 18  Temp: 97.9 F (36.6 C)   Filed Weights   02/23/17 1342  Weight: 119 lb 12.8 oz (54.3 kg)   .Body mass index is 20.56 kg/m.  GENERAL:alert, in no acute distress and comfortable SKIN: skin color, texture, turgor are normal, no rashes or significant lesions EYES: normal, conjunctiva are pink and non-injected, sclera clear OROPHARYNX:no exudate, no erythema and lips, buccal mucosa, and tongue normal  NECK: supple, no JVD, thyroid normal size, non-tender, without nodularity LYMPH:  no palpable lymphadenopathy in the cervical, axillary or inguinal LUNGS: clear to auscultation with normal respiratory effort HEART: regular rate & rhythm,  no murmurs and no lower extrety [place] ABDOMEN: abdomen soft, non-tender, normoactive bowel sounds  Musculoskeletal: no cyanosis of digits and no clubbing  PSYCH: alert & oriented x 3 with fluent speech NEURO: no focal motor/sensory deficits  LABORATORY DATA:  I have reviewed the data as listed  . CBC Latest Ref Rng & Units 02/23/2017 02/09/2017 02/02/2017  WBC 3.9 - 10.3 10e3/uL 33.5(H) 47.9(H) 2.4(L)  Hemoglobin 11.6 - 15.9 g/dL 10.7(L) 11.6 11.8  Hematocrit 34.8 - 46.6 % 33.7(L) 36.1 37.4  Platelets 145 - 400 10e3/uL 409(H) 446(H) 340   . CBC    Component Value Date/Time   WBC 33.5 (H) 02/23/2017 1247   WBC 13.7 (H) 01/25/2017 0807   RBC 4.33 02/23/2017 1247   RBC 3.84 (L) 01/25/2017 0807   HGB 10.7 (L) 02/23/2017 1247   HCT 33.7 (L) 02/23/2017 1247   PLT 409 (H) 02/23/2017 1247   MCV 77.8 (L) 02/23/2017 1247   MCH 24.7 (L) 02/23/2017 1247   MCH 22.7 (L) 01/25/2017 0807   MCHC 31.8 02/23/2017 1247   MCHC 31.8 01/25/2017  0807   RDW 24.8 (H) 02/23/2017 1247   LYMPHSABS 2.0 02/23/2017 1247   MONOABS 1.6 (H) 02/23/2017 1247   EOSABS 0.9 (H) 02/23/2017 1247  BASOSABS 0.5 (H) 02/23/2017 1247    . CMP Latest Ref Rng & Units 02/23/2017 02/09/2017 02/02/2017  Glucose 70 - 140 mg/dl 100 90 80  BUN 7.0 - 26.0 mg/dL 11.2 16.7 18.4  Creatinine 0.6 - 1.1 mg/dL 0.9 0.9 0.8  Sodium 136 - 145 mEq/L 140 140 137  Potassium 3.5 - 5.1 mEq/L 4.0 4.0 3.9  Chloride 96 - 112 mEq/L - - -  CO2 22 - 29 mEq/L 26 25 26   Calcium 8.4 - 10.4 mg/dL 8.9 9.7 9.5  Total Protein 6.4 - 8.3 g/dL 6.6 7.4 7.4  Total Bilirubin 0.20 - 1.20 mg/dL 0.26 0.28 0.41  Alkaline Phos 40 - 150 U/L 161(H) 174(H) 123  AST 5 - 34 U/L 21 25 18   ALT 0 - 55 U/L 18 26 27      RADIOGRAPHIC STUDIES: I have personally reviewed the radiological images as listed and agreed with the findings in the report. Ir US Guide Vasc Access Right  Result Date: 01/25/2017 CLINICAL DATA:  Hodgkin's lymphoma and need for porta cath to begin chemotherapy. EXAM: IMPLANTED PORT A CATH PLACEMENT WITH ULTRASOUND AND FLUOROSCOPIC GUIDANCE ANESTHESIA/SEDATION: 2.0 mg IV Versed; 100 mcg IV Fentanyl Total Moderate Sedation Time:  30 minutes The patient's level of consciousness and physiologic status were continuously monitored during the procedure by Radiology nursing. Additional Medications: 2 g IV Ancef. As antibiotic prophylaxis, Ancef was ordered pre-procedure and administered intravenously within one hour of incision. FLUOROSCOPY TIME:  18 seconds.  2.0 mGy. PROCEDURE: The procedure, risks, benefits, and alternatives were explained to the patient. Questions regarding the procedure were encouraged and answered. The patient understands and consents to the procedure. A time-out was performed prior to initiating the procedure. Ultrasound was utilized to confirm patency of the right internal jugular vein. The right neck and chest were prepped with chlorhexidine in a sterile fashion, and a  sterile drape was applied covering the operative field. Maximum barrier sterile technique with sterile gowns and gloves were used for the procedure. Local anesthesia was provided with 1% lidocaine. After creating a small venotomy incision, a 21 gauge needle was advanced into the right internal jugular vein under direct, real-time ultrasound guidance. Ultrasound image documentation was performed. After securing guidewire access, an 8 Fr dilator was placed. A J-wire was kinked to measure appropriate catheter length. A subcutaneous port pocket was then created along the upper chest wall utilizing sharp and blunt dissection. Portable cautery was utilized. The pocket was irrigated with sterile saline. A single lumen power injectable port was chosen for placement. The 8 Fr catheter was tunneled from the port pocket site to the venotomy incision. The port was placed in the pocket. External catheter was trimmed to appropriate length based on guidewire measurement. At the venotomy, an 8 Fr peel-away sheath was placed over a guidewire. The catheter was then placed through the sheath and the sheath removed. Final catheter positioning was confirmed and documented with a fluoroscopic spot image. The port was accessed with a needle and aspirated and flushed with heparinized saline. The access needle was removed. The venotomy and port pocket incisions were closed with subcutaneous 3-0 Monocryl and subcuticular 4-0 Vicryl. Dermabond was applied to both incisions. COMPLICATIONS: COMPLICATIONS None FINDINGS: After catheter placement, the tip lies at the cavo-atrial junction. The catheter aspirates normally and is ready for immediate use. IMPRESSION: Placement of single lumen port a cath via right internal jugular vein. The catheter tip lies at the cavo-atrial junction. A power injectable port a cath was placed  and is ready for immediate use. Electronically Signed   By: [name] M.D.   On: 01/25/2017 15:29   Ir Fluoro Guide  Port Insertion Right  Result Date: 01/25/2017 CLINICAL DATA:  Hodgkin's lymphoma and need for porta cath to begin chemotherapy. EXAM: IMPLANTED PORT A CATH PLACEMENT WITH ULTRASOUND AND FLUOROSCOPIC GUIDANCE ANESTHESIA/SEDATION: 2.0 mg IV Versed; 100 mcg IV Fentanyl Total Moderate Sedation Time:  30 minutes The patient's level of consciousness and physiologic status were continuously monitored during the procedure by Radiology nursing. Additional Medications: 2 g IV Ancef. As antibiotic prophylaxis, Ancef was ordered pre-procedure and administered intravenously within one hour of incision. FLUOROSCOPY TIME:  18 seconds.  2.0 mGy. PROCEDURE: The procedure, risks, benefits, and alternatives were explained to the patient. Questions regarding the procedure were encouraged and answered. The patient understands and consents to the procedure. A time-out was performed prior to initiating the procedure. Ultrasound was utilized to confirm patency of the right internal jugular vein. The right neck and chest were prepped with chlorhexidine in a sterile fashion, and a sterile drape was applied covering the operative field. Maximum barrier sterile technique with sterile gowns and gloves were used for the procedure. Local anesthesia was provided with 1% lidocaine. After creating a small venotomy incision, a 21 gauge needle was advanced into the right internal jugular vein under direct, real-time ultrasound guidance. Ultrasound image documentation was performed. After securing guidewire access, an 8 Fr dilator was placed. A J-wire was kinked to measure appropriate catheter length. A subcutaneous port pocket was then created along the upper chest wall utilizing sharp and blunt dissection. Portable cautery was utilized. The pocket was irrigated with sterile saline. A single lumen power injectable port was chosen for placement. The 8 Fr catheter was tunneled from the port pocket site to the venotomy incision. The port was placed in  the pocket. External catheter was trimmed to appropriate length based on guidewire measurement. At the venotomy, an 8 Fr peel-away sheath was placed over a guidewire. The catheter was then placed through the sheath and the sheath removed. Final catheter positioning was confirmed and documented with a fluoroscopic spot image. The port was accessed with a needle and aspirated and flushed with heparinized saline. The access needle was removed. The venotomy and port pocket incisions were closed with subcutaneous 3-0 Monocryl and subcuticular 4-0 Vicryl. Dermabond was applied to both incisions. COMPLICATIONS: COMPLICATIONS None FINDINGS: After catheter placement, the tip lies at the cavo-atrial junction. The catheter aspirates normally and is ready for immediate use. IMPRESSION: Placement of single lumen port a cath via right internal jugular vein. The catheter tip lies at the cavo-atrial junction. A power injectable port a cath was placed and is ready for immediate use. Electronically Signed   By: [name] M.D.   On: 01/25/2017 15:29   Transthoracic Echocardiography  Patient:    Coua, Caudill MR #:       [id number] Study Date: 06/11/2016 Gender:     F Age:        71 Height:     162.6 cm Weight:     62 kg BSA:        1.68 m^2 Pt. Status: Room:   Jetty Duhamel, M.D.  REFERRING    [name], M.D.  ATTENDING    Loralie Champagne, M.D.  PERFORMING   [facility], Outpatient  SONOGRAPHER  [facility], RDCS  cc:  ------------------------------------------------------------------- LV EF: 60% -   65%  ------------------------------------------------------------------- Indications:  TIA (G45.9).  ------------------------------------------------------------------- History:   PMH:   Coronary artery disease.  Stroke.  Risk factors: Family history of coronary artery disease. Former tobacco use. Hypertension.  Dyslipidemia.  ------------------------------------------------------------------- Study Conclusions  - Left ventricle: The cavity size was normal. Wall thickness was   increased in a pattern of mild LVH. Systolic function was normal.   The estimated ejection fraction was in the range of 60% to 65%.   Wall motion was normal; there were no regional wall motion   abnormalities. Doppler parameters are consistent with abnormal   left ventricular relaxation (grade 1 diastolic dysfunction).   Normal strain pattern, GLS -22.7%. - Aortic valve: There was no stenosis. - Mitral valve: Mildly calcified annulus. There was no significant   regurgitation. - Left atrium: The atrium was mildly dilated. - Right ventricle: The cavity size was normal. Systolic function   was normal. - Pulmonary arteries: PA peak pressure: 19 mm Hg (S). - Inferior vena cava: The vessel was normal in size. The   respirophasic diameter changes were in the normal range (= 50%),   consistent with normal central venous pressure.  Impressions:  - Normal LV size and systolic function, EF 123456. Mild LV   hypertrophy. Normal RV size and systolic function. No significant   valvular abnormalities.   ASSESSMENT & PLAN:   83 year old Caucasian female with ECOG performance status of 2 with  1) Classical Hodgkin's lymphoma of nodular sclerosis variety . Atleast Stage IIIB some concern for possible Rt lung involvement which make in Stage IVB Presented with right neck lymph nodes . She had type B constitutional symptoms with 15-20 pound weight loss night sweats or chills .noted to have significant pruritus with mild rash likely from Hodgkin's  . Wt Readings from Last 3 Encounters:  02/23/17 119 lb 12.8 oz (54.3 kg)  02/02/17 116 lb 11.2 oz (52.9 kg)  01/25/17 120 lb 11.2 oz (54.7 kg)  - her constitutional symptoms, pruritus and rash have resolved. Rt neck swelling reduced in size. 2) Microcytic anemia - likely anemia of  chronic disease due to Hodgkin's lymphoma .  No evidence of iron def. Improved after PRBC transfusion . Stable. Now partly from chemotherapy 3) Neutrophilic leucocytosis - likely from steroid and Neulasta. No evidence of infection. PLAN - labs stable. No prohibitive toxicities -continue AVD with G-CSF support. -infection prevention strategies discussed.  -she is eating better and was counseled to optimize po food intake. -counseled to use walker and cane - to pursue fall precautions -constipation - resolved with Miralax. Has IBS at baseline  3) . Patient Active Problem List   Diagnosis Date Noted  . Port catheter in place 02/02/2017  . Nodular sclerosis Hodgkin lymphoma of lymph nodes of neck ([place]) 01/13/2017  . Dyspnea 09/14/2012  . Epistaxis 06/02/2011  . HYPERCHOLESTEROLEMIA, MIXED 01/08/2009  . ESSENTIAL HYPERTENSION, BENIGN 01/08/2009  . Coronary atherosclerosis 01/08/2009  . HYPOTHYROIDISM, HX OF 01/08/2009  PLAN -Continue follow-up with primary care physician for management of other chronic medical co-morbidities   continue chemotherapy as per schedule. -RTC with Dr [name] C3D1 with lab -continue labs q2week with chemotherapy   All of the patients questions were answered with apparent satisfaction. The patient knows to call the clinic with any problems, questions or concerns.  I spent 20 minutes counseling the patient face to face. The total time spent in the appointment was 25 minutes and more than 50% was on counseling and direct patient cares.    Sullivan Lone MD [place] AAHIVMS [facility]  [facility] Hematology/Oncology Physician [facility]  (Office):       [phone] (Work cell):  [phone] (Fax):           [phone]

## 2017-02-23 NOTE — Patient Instructions (Signed)
[facility] Discharge Instructions for Patients Receiving Chemotherapy  Today you received the following chemotherapy agents:  Adriamycin,  Vinblastine, Dacarbazine  To help prevent nausea and vomiting after your treatment, we encourage you to take your nausea medication as prescribed.   If you develop nausea and vomiting that is not controlled by your nausea medication, call the clinic.   BELOW ARE SYMPTOMS THAT SHOULD BE REPORTED IMMEDIATELY:  *FEVER GREATER THAN 100.5 F  *CHILLS WITH OR WITHOUT FEVER  NAUSEA AND VOMITING THAT IS NOT CONTROLLED WITH YOUR NAUSEA MEDICATION  *UNUSUAL SHORTNESS OF BREATH  *UNUSUAL BRUISING OR BLEEDING  TENDERNESS IN MOUTH AND THROAT WITH OR WITHOUT PRESENCE OF ULCERS  *URINARY PROBLEMS  *BOWEL PROBLEMS  UNUSUAL RASH Items with * indicate a potential emergency and should be followed up as soon as possible.  Feel free to call the clinic you have any questions or concerns. The clinic phone number is [phone].  Please show the [place] at check-in to the Emergency Department and triage nurse.

## 2017-02-23 NOTE — Patient Instructions (Signed)
Implanted Port Home Guide An implanted port is a type of central line that is placed under the skin. Central lines are used to provide IV access when treatment or nutrition needs to be given through a person's veins. Implanted ports are used for long-term IV access. An implanted port may be placed because:  You need IV medicine that would be irritating to the small veins in your hands or arms.  You need long-term IV medicines, such as antibiotics.  You need IV nutrition for a long period.  You need frequent blood draws for lab tests.  You need dialysis.  Implanted ports are usually placed in the chest area, but they can also be placed in the upper arm, the abdomen, or the leg. An implanted port has two main parts:  Reservoir. The reservoir is round and will appear as a small, raised area under your skin. The reservoir is the part where a needle is inserted to give medicines or draw blood.  Catheter. The catheter is a thin, flexible tube that extends from the reservoir. The catheter is placed into a large vein. Medicine that is inserted into the reservoir goes into the catheter and then into the vein.  How will I care for my incision site? Do not get the incision site wet. Bathe or shower as directed by your health care provider. How is my port accessed? Special steps must be taken to access the port:  Before the port is accessed, a numbing cream can be placed on the skin. This helps numb the skin over the port site.  Your health care provider uses a sterile technique to access the port. ? Your health care provider must put on a mask and sterile gloves. ? The skin over your port is cleaned carefully with an antiseptic and allowed to dry. ? The port is gently pinched between sterile gloves, and a needle is inserted into the port.  Only "non-coring" port needles should be used to access the port. Once the port is accessed, a blood return should be checked. This helps ensure that the port  is in the vein and is not clogged.  If your port needs to remain accessed for a constant infusion, a clear (transparent) bandage will be placed over the needle site. The bandage and needle will need to be changed every week, or as directed by your health care provider.  Keep the bandage covering the needle clean and dry. Do not get it wet. Follow your health care provider's instructions on how to take a shower or bath while the port is accessed.  If your port does not need to stay accessed, no bandage is needed over the port.  What is flushing? Flushing helps keep the port from getting clogged. Follow your health care provider's instructions on how and when to flush the port. Ports are usually flushed with saline solution or a medicine called heparin. The need for flushing will depend on how the port is used.  If the port is used for intermittent medicines or blood draws, the port will need to be flushed: ? After medicines have been given. ? After blood has been drawn. ? As part of routine maintenance.  If a constant infusion is running, the port may not need to be flushed.  How long will my port stay implanted? The port can stay in for as long as your health care provider thinks it is needed. When it is time for the port to come out, surgery will be   done to remove it. The procedure is similar to the one performed when the port was put in. When should I seek immediate medical care? When you have an implanted port, you should seek immediate medical care if:  You notice a bad smell coming from the incision site.  You have swelling, redness, or drainage at the incision site.  You have more swelling or pain at the port site or the surrounding area.  You have a fever that is not controlled with medicine.  This information is not intended to replace advice given to you by your health care provider. Make sure you discuss any questions you have with your health care provider. Document  Released: 12/07/2005 Document Revised: 05/14/2016 Document Reviewed: 08/14/2013 Elsevier Interactive Patient Education  2017 [employer]  

## 2017-02-24 LAB — SEDIMENTATION RATE: Sedimentation Rate-Westergren: 25 mm/hr (ref 0–40)

## 2017-03-09 ENCOUNTER — Other Ambulatory Visit (HOSPITAL_BASED_OUTPATIENT_CLINIC_OR_DEPARTMENT_OTHER): Payer: Medicare Other

## 2017-03-09 ENCOUNTER — Ambulatory Visit: Payer: Medicare Other

## 2017-03-09 ENCOUNTER — Ambulatory Visit (HOSPITAL_BASED_OUTPATIENT_CLINIC_OR_DEPARTMENT_OTHER): Payer: Medicare Other

## 2017-03-09 VITALS — BP 114/63 | HR 72 | Temp 98.2°F | Resp 18

## 2017-03-09 DIAGNOSIS — C8111 Nodular sclerosis classical Hodgkin lymphoma, lymph nodes of head, face, and neck: Secondary | ICD-10-CM | POA: Diagnosis not present

## 2017-03-09 DIAGNOSIS — Z5111 Encounter for antineoplastic chemotherapy: Secondary | ICD-10-CM | POA: Diagnosis not present

## 2017-03-09 DIAGNOSIS — Z95828 Presence of other vascular implants and grafts: Secondary | ICD-10-CM

## 2017-03-09 LAB — COMPREHENSIVE METABOLIC PANEL
ALT: 17 U/L (ref 0–55)
AST: 19 U/L (ref 5–34)
Albumin: 3.6 g/dL (ref 3.5–5.0)
Alkaline Phosphatase: 145 U/L (ref 40–150)
Anion Gap: 8 mEq/L (ref 3–11)
BUN: 14.9 mg/dL (ref 7.0–26.0)
CO2: 27 mEq/L (ref 22–29)
Calcium: 9.2 mg/dL (ref 8.4–10.4)
Chloride: 106 mEq/L (ref 98–109)
Creatinine: 0.8 mg/dL (ref 0.6–1.1)
EGFR: 73 mL/min/{1.73_m2} — ABNORMAL LOW (ref >90)
Glucose: 116 mg/dl (ref 70–140)
Potassium: 4.1 mEq/L (ref 3.5–5.1)
Sodium: 141 mEq/L (ref 136–145)
Total Bilirubin: <0.22 mg/dL (ref 0.20–1.20)
Total Protein: 6.5 g/dL (ref 6.4–8.3)

## 2017-03-09 LAB — CBC WITH DIFFERENTIAL/PLATELET
BASO%: 0.6 % (ref 0.0–2.0)
Basophils Absolute: 0.2 10*3/uL — ABNORMAL HIGH (ref 0.0–0.1)
EOS%: 2.4 % (ref 0.0–7.0)
Eosinophils Absolute: 0.7 10*3/uL — ABNORMAL HIGH (ref 0.0–0.5)
HCT: 31.6 % — ABNORMAL LOW (ref 34.8–46.6)
HGB: 10.2 g/dL — ABNORMAL LOW (ref 11.6–15.9)
LYMPH%: 5 % — ABNORMAL LOW (ref 14.0–49.7)
MCH: 25.3 pg (ref 25.1–34.0)
MCHC: 32.3 g/dL (ref 31.5–36.0)
MCV: 78.2 fL — ABNORMAL LOW (ref 79.5–101.0)
MONO#: 0.9 10*3/uL (ref 0.1–0.9)
MONO%: 3.2 % (ref 0.0–14.0)
NEUT#: 24.7 10*3/uL — ABNORMAL HIGH (ref 1.5–6.5)
NEUT%: 88.8 % — ABNORMAL HIGH (ref 38.4–76.8)
Platelets: 447 10*3/uL — ABNORMAL HIGH (ref 145–400)
RBC: 4.04 10*6/uL (ref 3.70–5.45)
RDW: 28.7 % — ABNORMAL HIGH (ref 11.2–14.5)
WBC: 27.8 10*3/uL — ABNORMAL HIGH (ref 3.9–10.3)
lymph#: 1.4 10*3/uL (ref 0.9–3.3)

## 2017-03-09 MED ORDER — DACARBAZINE 200 MG IV SOLR
375.0000 mg/m2 | Freq: Once | INTRAVENOUS | Status: AC
  Administered 2017-03-09: 600 mg via INTRAVENOUS
  Filled 2017-03-09: qty 30

## 2017-03-09 MED ORDER — PALONOSETRON HCL INJECTION 0.25 MG/5ML
INTRAVENOUS | Status: AC
  Filled 2017-03-09: qty 5

## 2017-03-09 MED ORDER — HEPARIN SOD (PORK) LOCK FLUSH 100 UNIT/ML IV SOLN
500.0000 [IU] | Freq: Once | INTRAVENOUS | Status: AC | PRN
  Administered 2017-03-09: 500 [IU]
  Filled 2017-03-09: qty 5

## 2017-03-09 MED ORDER — SODIUM CHLORIDE 0.9% FLUSH
10.0000 mL | INTRAVENOUS | Status: DC | PRN
  Administered 2017-03-09: 10 mL
  Filled 2017-03-09: qty 10

## 2017-03-09 MED ORDER — PEGFILGRASTIM 6 MG/0.6ML ~~LOC~~ PSKT
6.0000 mg | PREFILLED_SYRINGE | Freq: Once | SUBCUTANEOUS | Status: AC
  Administered 2017-03-09: 6 mg via SUBCUTANEOUS
  Filled 2017-03-09: qty 0.6

## 2017-03-09 MED ORDER — PALONOSETRON HCL INJECTION 0.25 MG/5ML
0.2500 mg | Freq: Once | INTRAVENOUS | Status: AC
  Administered 2017-03-09: 0.25 mg via INTRAVENOUS

## 2017-03-09 MED ORDER — SODIUM CHLORIDE 0.9 % IV SOLN
Freq: Once | INTRAVENOUS | Status: AC
  Administered 2017-03-09: 13:00:00 via INTRAVENOUS

## 2017-03-09 MED ORDER — VINBLASTINE SULFATE CHEMO INJECTION 1 MG/ML
6.3000 mg/m2 | Freq: Once | INTRAVENOUS | Status: AC
  Administered 2017-03-09: 10 mg via INTRAVENOUS
  Filled 2017-03-09: qty 10

## 2017-03-09 MED ORDER — SODIUM CHLORIDE 0.9 % IV SOLN
Freq: Once | INTRAVENOUS | Status: AC
  Administered 2017-03-09: 13:00:00 via INTRAVENOUS
  Filled 2017-03-09: qty 5

## 2017-03-09 MED ORDER — DOXORUBICIN HCL CHEMO IV INJECTION 2 MG/ML
25.0000 mg/m2 | Freq: Once | INTRAVENOUS | Status: AC
  Administered 2017-03-09: 40 mg via INTRAVENOUS
  Filled 2017-03-09: qty 20

## 2017-03-09 MED ORDER — SODIUM CHLORIDE 0.9% FLUSH
10.0000 mL | INTRAVENOUS | Status: DC | PRN
  Administered 2017-03-09: 10 mL via INTRAVENOUS
  Filled 2017-03-09: qty 10

## 2017-03-09 NOTE — Progress Notes (Signed)
Nutrition Follow-up:   Nutrition follow-up completed in infusion this pm. Patient receiving chemotherapy for hodgkin's lymphoma.  She is a patient of Dr. [name].  Patient reports that she is trying to eat every 3-4 hours.  Patient reports that she has been eating a lot of soup lately because friends and family has been making that for her.  Reports that she is drinking a protein drink because she bought a case of it (it has more protein than ensure).   Reports some trouble with constipation but no other nutrition issues   Medications: miralax  Labs: reviewed  Anthropometrics:   Noted weight on 3/6 119 pounds increased from 116 pounds on 2/13.    NUTRITION DIAGNOSIS: Unintentional weight loss improved   MALNUTRITION DIAGNOSIS: Severe malnutrition continues   INTERVENTION:   Discussed oral nutrition supplements that have increased calories and protein.  Patient reports "I remember you talking about that last time." "I will have to get some of that when I run out of my other shake." Discussed strategies to help with constipation and fact sheet given.  Patient verbalized understanding.    MONITORING, EVALUATION, GOAL: Patient will tolerate increased calories and protein to minimize further weight loss and tolerate treatment   NEXT VISIT:  As needed  Freeland Pracht B. Zenia Resides, [place], [place] Registered Dietitian [phone] (pager)

## 2017-03-09 NOTE — Progress Notes (Signed)
Blood return noted before, every 64mls and after Adriamycin push and before Vinblastine.

## 2017-03-09 NOTE — Patient Instructions (Signed)
[facility] Discharge Instructions for Patients Receiving Chemotherapy  Today you received the following chemotherapy agents: Adriamycin, Vinblastine and DTIC  To help prevent nausea and vomiting after your treatment, we encourage you to take your nausea medication as directed.  If you develop nausea and vomiting that is not controlled by your nausea medication, call the clinic.   BELOW ARE SYMPTOMS THAT SHOULD BE REPORTED IMMEDIATELY:  *FEVER GREATER THAN 100.5 F  *CHILLS WITH OR WITHOUT FEVER  NAUSEA AND VOMITING THAT IS NOT CONTROLLED WITH YOUR NAUSEA MEDICATION  *UNUSUAL SHORTNESS OF BREATH  *UNUSUAL BRUISING OR BLEEDING  TENDERNESS IN MOUTH AND THROAT WITH OR WITHOUT PRESENCE OF ULCERS  *URINARY PROBLEMS  *BOWEL PROBLEMS  UNUSUAL RASH Items with * indicate a potential emergency and should be followed up as soon as possible.  Feel free to call the clinic you have any questions or concerns. The clinic phone number is [phone].  Please show the CHEMO ALERT CARD at check-in to the Emergency Department and triage nurse.   

## 2017-03-09 NOTE — Patient Instructions (Signed)
Implanted Port Home Guide An implanted port is a type of central line that is placed under the skin. Central lines are used to provide IV access when treatment or nutrition needs to be given through a person's veins. Implanted ports are used for long-term IV access. An implanted port may be placed because:  You need IV medicine that would be irritating to the small veins in your hands or arms.  You need long-term IV medicines, such as antibiotics.  You need IV nutrition for a long period.  You need frequent blood draws for lab tests.  You need dialysis.  Implanted ports are usually placed in the chest area, but they can also be placed in the upper arm, the abdomen, or the leg. An implanted port has two main parts:  Reservoir. The reservoir is round and will appear as a small, raised area under your skin. The reservoir is the part where a needle is inserted to give medicines or draw blood.  Catheter. The catheter is a thin, flexible tube that extends from the reservoir. The catheter is placed into a large vein. Medicine that is inserted into the reservoir goes into the catheter and then into the vein.  How will I care for my incision site? Do not get the incision site wet. Bathe or shower as directed by your health care provider. How is my port accessed? Special steps must be taken to access the port:  Before the port is accessed, a numbing cream can be placed on the skin. This helps numb the skin over the port site.  Your health care provider uses a sterile technique to access the port. ? Your health care provider must put on a mask and sterile gloves. ? The skin over your port is cleaned carefully with an antiseptic and allowed to dry. ? The port is gently pinched between sterile gloves, and a needle is inserted into the port.  Only "non-coring" port needles should be used to access the port. Once the port is accessed, a blood return should be checked. This helps ensure that the port  is in the vein and is not clogged.  If your port needs to remain accessed for a constant infusion, a clear (transparent) bandage will be placed over the needle site. The bandage and needle will need to be changed every week, or as directed by your health care provider.  Keep the bandage covering the needle clean and dry. Do not get it wet. Follow your health care provider's instructions on how to take a shower or bath while the port is accessed.  If your port does not need to stay accessed, no bandage is needed over the port.  What is flushing? Flushing helps keep the port from getting clogged. Follow your health care provider's instructions on how and when to flush the port. Ports are usually flushed with saline solution or a medicine called heparin. The need for flushing will depend on how the port is used.  If the port is used for intermittent medicines or blood draws, the port will need to be flushed: ? After medicines have been given. ? After blood has been drawn. ? As part of routine maintenance.  If a constant infusion is running, the port may not need to be flushed.  How long will my port stay implanted? The port can stay in for as long as your health care provider thinks it is needed. When it is time for the port to come out, surgery will be   done to remove it. The procedure is similar to the one performed when the port was put in. When should I seek immediate medical care? When you have an implanted port, you should seek immediate medical care if:  You notice a bad smell coming from the incision site.  You have swelling, redness, or drainage at the incision site.  You have more swelling or pain at the port site or the surrounding area.  You have a fever that is not controlled with medicine.  This information is not intended to replace advice given to you by your health care provider. Make sure you discuss any questions you have with your health care provider. Document  Released: 12/07/2005 Document Revised: 05/14/2016 Document Reviewed: 08/14/2013 Elsevier Interactive Patient Education  2017 [employer]  

## 2017-03-23 ENCOUNTER — Ambulatory Visit (HOSPITAL_BASED_OUTPATIENT_CLINIC_OR_DEPARTMENT_OTHER): Payer: Medicare Other

## 2017-03-23 ENCOUNTER — Other Ambulatory Visit (HOSPITAL_BASED_OUTPATIENT_CLINIC_OR_DEPARTMENT_OTHER): Payer: Medicare Other

## 2017-03-23 ENCOUNTER — Ambulatory Visit: Payer: Medicare Other

## 2017-03-23 ENCOUNTER — Telehealth: Payer: Self-pay | Admitting: Hematology

## 2017-03-23 VITALS — BP 115/71 | HR 68 | Temp 98.4°F | Resp 18

## 2017-03-23 DIAGNOSIS — Z5111 Encounter for antineoplastic chemotherapy: Secondary | ICD-10-CM

## 2017-03-23 DIAGNOSIS — Z95828 Presence of other vascular implants and grafts: Secondary | ICD-10-CM

## 2017-03-23 DIAGNOSIS — C8111 Nodular sclerosis classical Hodgkin lymphoma, lymph nodes of head, face, and neck: Secondary | ICD-10-CM

## 2017-03-23 LAB — CBC WITH DIFFERENTIAL/PLATELET
BASO%: 0.7 % (ref 0.0–2.0)
Basophils Absolute: 0.2 10*3/uL — ABNORMAL HIGH (ref 0.0–0.1)
EOS%: 2.2 % (ref 0.0–7.0)
Eosinophils Absolute: 0.6 10*3/uL — ABNORMAL HIGH (ref 0.0–0.5)
HCT: 30.8 % — ABNORMAL LOW (ref 34.8–46.6)
HGB: 9.9 g/dL — ABNORMAL LOW (ref 11.6–15.9)
LYMPH%: 5.3 % — ABNORMAL LOW (ref 14.0–49.7)
MCH: 26.2 pg (ref 25.1–34.0)
MCHC: 32.3 g/dL (ref 31.5–36.0)
MCV: 81.1 fL (ref 79.5–101.0)
MONO#: 1.2 10*3/uL — ABNORMAL HIGH (ref 0.1–0.9)
MONO%: 4.3 % (ref 0.0–14.0)
NEUT#: 24.6 10*3/uL — ABNORMAL HIGH (ref 1.5–6.5)
NEUT%: 87.5 % — ABNORMAL HIGH (ref 38.4–76.8)
Platelets: 453 10*3/uL — ABNORMAL HIGH (ref 145–400)
RBC: 3.8 10*6/uL (ref 3.70–5.45)
RDW: 29.2 % — ABNORMAL HIGH (ref 11.2–14.5)
WBC: 28 10*3/uL — ABNORMAL HIGH (ref 3.9–10.3)
lymph#: 1.5 10*3/uL (ref 0.9–3.3)

## 2017-03-23 LAB — COMPREHENSIVE METABOLIC PANEL
ALT: 23 U/L (ref 0–55)
AST: 22 U/L (ref 5–34)
Albumin: 3.7 g/dL (ref 3.5–5.0)
Alkaline Phosphatase: 145 U/L (ref 40–150)
Anion Gap: 9 mEq/L (ref 3–11)
BUN: 10.2 mg/dL (ref 7.0–26.0)
CO2: 26 mEq/L (ref 22–29)
Calcium: 9.3 mg/dL (ref 8.4–10.4)
Chloride: 106 mEq/L (ref 98–109)
Creatinine: 0.7 mg/dL (ref 0.6–1.1)
EGFR: 76 mL/min/{1.73_m2} — ABNORMAL LOW (ref >90)
Glucose: 99 mg/dl (ref 70–140)
Potassium: 4.1 mEq/L (ref 3.5–5.1)
Sodium: 141 mEq/L (ref 136–145)
Total Bilirubin: 0.22 mg/dL (ref 0.20–1.20)
Total Protein: 6.3 g/dL — ABNORMAL LOW (ref 6.4–8.3)

## 2017-03-23 MED ORDER — PEGFILGRASTIM 6 MG/0.6ML ~~LOC~~ PSKT
6.0000 mg | PREFILLED_SYRINGE | Freq: Once | SUBCUTANEOUS | Status: AC
  Administered 2017-03-23: 6 mg via SUBCUTANEOUS
  Filled 2017-03-23: qty 0.6

## 2017-03-23 MED ORDER — PALONOSETRON HCL INJECTION 0.25 MG/5ML
0.2500 mg | Freq: Once | INTRAVENOUS | Status: AC
  Administered 2017-03-23: 0.25 mg via INTRAVENOUS

## 2017-03-23 MED ORDER — DOXORUBICIN HCL CHEMO IV INJECTION 2 MG/ML
25.0000 mg/m2 | Freq: Once | INTRAVENOUS | Status: AC
  Administered 2017-03-23: 40 mg via INTRAVENOUS
  Filled 2017-03-23: qty 20

## 2017-03-23 MED ORDER — SODIUM CHLORIDE 0.9 % IV SOLN
Freq: Once | INTRAVENOUS | Status: AC
  Administered 2017-03-23: 12:00:00 via INTRAVENOUS

## 2017-03-23 MED ORDER — HEPARIN SOD (PORK) LOCK FLUSH 100 UNIT/ML IV SOLN
500.0000 [IU] | Freq: Once | INTRAVENOUS | Status: AC | PRN
  Administered 2017-03-23: 500 [IU]
  Filled 2017-03-23: qty 5

## 2017-03-23 MED ORDER — SODIUM CHLORIDE 0.9 % IV SOLN
Freq: Once | INTRAVENOUS | Status: AC
  Administered 2017-03-23: 13:00:00 via INTRAVENOUS
  Filled 2017-03-23: qty 5

## 2017-03-23 MED ORDER — SODIUM CHLORIDE 0.9 % IV SOLN
375.0000 mg/m2 | Freq: Once | INTRAVENOUS | Status: AC
  Administered 2017-03-23: 600 mg via INTRAVENOUS
  Filled 2017-03-23: qty 30

## 2017-03-23 MED ORDER — SODIUM CHLORIDE 0.9 % IV SOLN
6.3000 mg/m2 | Freq: Once | INTRAVENOUS | Status: AC
  Administered 2017-03-23: 10 mg via INTRAVENOUS
  Filled 2017-03-23: qty 10

## 2017-03-23 MED ORDER — SODIUM CHLORIDE 0.9% FLUSH
10.0000 mL | INTRAVENOUS | Status: DC | PRN
  Administered 2017-03-23: 10 mL via INTRAVENOUS
  Filled 2017-03-23: qty 10

## 2017-03-23 MED ORDER — PALONOSETRON HCL INJECTION 0.25 MG/5ML
INTRAVENOUS | Status: AC
  Filled 2017-03-23: qty 5

## 2017-03-23 MED ORDER — SODIUM CHLORIDE 0.9% FLUSH
10.0000 mL | INTRAVENOUS | Status: DC | PRN
  Administered 2017-03-23: 10 mL
  Filled 2017-03-23: qty 10

## 2017-03-23 NOTE — Telephone Encounter (Signed)
Patient came to scheduling to schedule 4.17 appointments. Scheduled per scheduling message.

## 2017-03-23 NOTE — Progress Notes (Signed)
Blood return noted from port a cath before, every 3 cc and after Adriamycin push and before and after Vinblastine.

## 2017-03-23 NOTE — Patient Instructions (Signed)
[facility] Discharge Instructions for Patients Receiving Chemotherapy  Today you received the following chemotherapy agents: Adriamycin, Vinblastine, Dacarbazine   To help prevent nausea and vomiting after your treatment, we encourage you to take your nausea medication as directed.    If you develop nausea and vomiting that is not controlled by your nausea medication, call the clinic.   BELOW ARE SYMPTOMS THAT SHOULD BE REPORTED IMMEDIATELY:  *FEVER GREATER THAN 100.5 F  *CHILLS WITH OR WITHOUT FEVER  NAUSEA AND VOMITING THAT IS NOT CONTROLLED WITH YOUR NAUSEA MEDICATION  *UNUSUAL SHORTNESS OF BREATH  *UNUSUAL BRUISING OR BLEEDING  TENDERNESS IN MOUTH AND THROAT WITH OR WITHOUT PRESENCE OF ULCERS  *URINARY PROBLEMS  *BOWEL PROBLEMS  UNUSUAL RASH Items with * indicate a potential emergency and should be followed up as soon as possible.  Feel free to call the clinic you have any questions or concerns. The clinic phone number is [phone].  Please show the [place] at check-in to the Emergency Department and triage nurse.

## 2017-03-24 ENCOUNTER — Telehealth: Payer: Self-pay | Admitting: Hematology

## 2017-03-24 LAB — SEDIMENTATION RATE: Sedimentation Rate-Westergren: 12 mm/hr (ref 0–40)

## 2017-03-24 NOTE — Telephone Encounter (Signed)
Per sch message from Paulita Fujita 4/3 - already scheduled by Enis Gash no additional appts needed

## 2017-03-29 ENCOUNTER — Other Ambulatory Visit: Payer: Self-pay | Admitting: *Deleted

## 2017-03-29 ENCOUNTER — Other Ambulatory Visit: Payer: Self-pay | Admitting: Medical Oncology

## 2017-03-29 ENCOUNTER — Telehealth: Payer: Self-pay | Admitting: Medical Oncology

## 2017-03-29 MED ORDER — POLYSACCHARIDE IRON COMPLEX 150 MG PO CAPS: 150.0000 mg | ORAL_CAPSULE | Freq: Every day | ORAL | 1 refills | Status: DC

## 2017-03-29 NOTE — Telephone Encounter (Signed)
States she needs PET scan before next appt on 17th . I do not not see any order. Please f/u with pt .

## 2017-03-29 NOTE — Telephone Encounter (Signed)
Per MD [name], PET scan will be ordered and done after the completion of cycle 3. Patient informed and verbalized understanding.

## 2017-04-06 ENCOUNTER — Ambulatory Visit (HOSPITAL_BASED_OUTPATIENT_CLINIC_OR_DEPARTMENT_OTHER): Payer: Medicare Other | Admitting: Hematology

## 2017-04-06 ENCOUNTER — Other Ambulatory Visit (HOSPITAL_BASED_OUTPATIENT_CLINIC_OR_DEPARTMENT_OTHER): Payer: Medicare Other

## 2017-04-06 ENCOUNTER — Encounter: Payer: Self-pay | Admitting: Hematology

## 2017-04-06 ENCOUNTER — Ambulatory Visit (HOSPITAL_BASED_OUTPATIENT_CLINIC_OR_DEPARTMENT_OTHER): Payer: Medicare Other

## 2017-04-06 ENCOUNTER — Ambulatory Visit: Payer: Medicare Other

## 2017-04-06 ENCOUNTER — Telehealth: Payer: Self-pay | Admitting: Hematology

## 2017-04-06 VITALS — BP 133/66 | HR 85 | Temp 98.4°F | Resp 18 | Ht 64.0 in | Wt 119.2 lb

## 2017-04-06 DIAGNOSIS — Z5111 Encounter for antineoplastic chemotherapy: Secondary | ICD-10-CM | POA: Diagnosis not present

## 2017-04-06 DIAGNOSIS — C8111 Nodular sclerosis classical Hodgkin lymphoma, lymph nodes of head, face, and neck: Secondary | ICD-10-CM

## 2017-04-06 DIAGNOSIS — D72829 Elevated white blood cell count, unspecified: Secondary | ICD-10-CM

## 2017-04-06 DIAGNOSIS — G4700 Insomnia, unspecified: Secondary | ICD-10-CM

## 2017-04-06 LAB — CBC WITH DIFFERENTIAL/PLATELET
BASO%: 0.7 % (ref 0.0–2.0)
Basophils Absolute: 0.2 10*3/uL — ABNORMAL HIGH (ref 0.0–0.1)
EOS%: 3.1 % (ref 0.0–7.0)
Eosinophils Absolute: 0.8 10*3/uL — ABNORMAL HIGH (ref 0.0–0.5)
HCT: 30.9 % — ABNORMAL LOW (ref 34.8–46.6)
HGB: 10.3 g/dL — ABNORMAL LOW (ref 11.6–15.9)
LYMPH%: 4.3 % — ABNORMAL LOW (ref 14.0–49.7)
MCH: 27.6 pg (ref 25.1–34.0)
MCHC: 33.2 g/dL (ref 31.5–36.0)
MCV: 83 fL (ref 79.5–101.0)
MONO#: 1.2 10*3/uL — ABNORMAL HIGH (ref 0.1–0.9)
MONO%: 4.5 % (ref 0.0–14.0)
NEUT#: 23.3 10*3/uL — ABNORMAL HIGH (ref 1.5–6.5)
NEUT%: 87.4 % — ABNORMAL HIGH (ref 38.4–76.8)
Platelets: 509 10*3/uL — ABNORMAL HIGH (ref 145–400)
RBC: 3.73 10*6/uL (ref 3.70–5.45)
RDW: 27.5 % — ABNORMAL HIGH (ref 11.2–14.5)
WBC: 26.7 10*3/uL — ABNORMAL HIGH (ref 3.9–10.3)
lymph#: 1.1 10*3/uL (ref 0.9–3.3)

## 2017-04-06 LAB — COMPREHENSIVE METABOLIC PANEL
ALT: 20 U/L (ref 0–55)
AST: 24 U/L (ref 5–34)
Albumin: 3.7 g/dL (ref 3.5–5.0)
Alkaline Phosphatase: 152 U/L — ABNORMAL HIGH (ref 40–150)
Anion Gap: 11 mEq/L (ref 3–11)
BUN: 13.7 mg/dL (ref 7.0–26.0)
CO2: 25 mEq/L (ref 22–29)
Calcium: 9.4 mg/dL (ref 8.4–10.4)
Chloride: 105 mEq/L (ref 98–109)
Creatinine: 0.8 mg/dL (ref 0.6–1.1)
EGFR: 72 mL/min/{1.73_m2} — ABNORMAL LOW (ref >90)
Glucose: 114 mg/dl (ref 70–140)
Potassium: 4.4 mEq/L (ref 3.5–5.1)
Sodium: 141 mEq/L (ref 136–145)
Total Bilirubin: 0.25 mg/dL (ref 0.20–1.20)
Total Protein: 6.6 g/dL (ref 6.4–8.3)

## 2017-04-06 MED ORDER — SODIUM CHLORIDE 0.9 % IV SOLN
Freq: Once | INTRAVENOUS | Status: AC
  Administered 2017-04-06: 10:00:00 via INTRAVENOUS

## 2017-04-06 MED ORDER — SODIUM CHLORIDE 0.9% FLUSH
10.0000 mL | INTRAVENOUS | Status: DC | PRN
  Administered 2017-04-06: 10 mL
  Filled 2017-04-06: qty 10

## 2017-04-06 MED ORDER — DOXORUBICIN HCL CHEMO IV INJECTION 2 MG/ML
25.0000 mg/m2 | Freq: Once | INTRAVENOUS | Status: AC
  Administered 2017-04-06: 40 mg via INTRAVENOUS
  Filled 2017-04-06: qty 20

## 2017-04-06 MED ORDER — PALONOSETRON HCL INJECTION 0.25 MG/5ML
0.2500 mg | Freq: Once | INTRAVENOUS | Status: AC
  Administered 2017-04-06: 0.25 mg via INTRAVENOUS

## 2017-04-06 MED ORDER — DACARBAZINE 200 MG IV SOLR
375.0000 mg/m2 | Freq: Once | INTRAVENOUS | Status: AC
  Administered 2017-04-06: 600 mg via INTRAVENOUS
  Filled 2017-04-06: qty 30

## 2017-04-06 MED ORDER — HEPARIN SOD (PORK) LOCK FLUSH 100 UNIT/ML IV SOLN
500.0000 [IU] | Freq: Once | INTRAVENOUS | Status: AC | PRN
  Administered 2017-04-06: 500 [IU]
  Filled 2017-04-06: qty 5

## 2017-04-06 MED ORDER — TRAZODONE HCL 50 MG PO TABS: 25.0000 mg | ORAL_TABLET | Freq: Every evening | ORAL | 0 refills | Status: DC | PRN

## 2017-04-06 MED ORDER — VINBLASTINE SULFATE CHEMO INJECTION 1 MG/ML
6.3000 mg/m2 | Freq: Once | INTRAVENOUS | Status: AC
  Administered 2017-04-06: 10 mg via INTRAVENOUS
  Filled 2017-04-06: qty 10

## 2017-04-06 MED ORDER — SODIUM CHLORIDE 0.9 % IV SOLN
Freq: Once | INTRAVENOUS | Status: AC
  Administered 2017-04-06: 11:00:00 via INTRAVENOUS
  Filled 2017-04-06: qty 5

## 2017-04-06 MED ORDER — PALONOSETRON HCL INJECTION 0.25 MG/5ML
INTRAVENOUS | Status: AC
  Filled 2017-04-06: qty 5

## 2017-04-06 MED ORDER — PEGFILGRASTIM 6 MG/0.6ML ~~LOC~~ PSKT
6.0000 mg | PREFILLED_SYRINGE | Freq: Once | SUBCUTANEOUS | Status: AC
  Administered 2017-04-06: 6 mg via SUBCUTANEOUS
  Filled 2017-04-06: qty 0.6

## 2017-04-06 NOTE — Telephone Encounter (Signed)
Appointments scheduled per 04/06/17 los. Patient was given a copy of the AVS report and appointment schedule per 04/06/17 los. °

## 2017-04-06 NOTE — Patient Instructions (Signed)
[facility] Discharge Instructions for Patients Receiving Chemotherapy  Today you received the following chemotherapy agents: Adriamycin, Vinblastine, Dacarbazine   To help prevent nausea and vomiting after your treatment, we encourage you to take your nausea medication as directed.    If you develop nausea and vomiting that is not controlled by your nausea medication, call the clinic.   BELOW ARE SYMPTOMS THAT SHOULD BE REPORTED IMMEDIATELY:  *FEVER GREATER THAN 100.5 F  *CHILLS WITH OR WITHOUT FEVER  NAUSEA AND VOMITING THAT IS NOT CONTROLLED WITH YOUR NAUSEA MEDICATION  *UNUSUAL SHORTNESS OF BREATH  *UNUSUAL BRUISING OR BLEEDING  TENDERNESS IN MOUTH AND THROAT WITH OR WITHOUT PRESENCE OF ULCERS  *URINARY PROBLEMS  *BOWEL PROBLEMS  UNUSUAL RASH Items with * indicate a potential emergency and should be followed up as soon as possible.  Feel free to call the clinic you have any questions or concerns. The clinic phone number is [phone].  Please show the [place] at check-in to the Emergency Department and triage nurse.

## 2017-04-06 NOTE — Patient Instructions (Signed)
Implanted Port Home Guide An implanted port is a type of central line that is placed under the skin. Central lines are used to provide IV access when treatment or nutrition needs to be given through a person's veins. Implanted ports are used for long-term IV access. An implanted port may be placed because:  You need IV medicine that would be irritating to the small veins in your hands or arms.  You need long-term IV medicines, such as antibiotics.  You need IV nutrition for a long period.  You need frequent blood draws for lab tests.  You need dialysis.  Implanted ports are usually placed in the chest area, but they can also be placed in the upper arm, the abdomen, or the leg. An implanted port has two main parts:  Reservoir. The reservoir is round and will appear as a small, raised area under your skin. The reservoir is the part where a needle is inserted to give medicines or draw blood.  Catheter. The catheter is a thin, flexible tube that extends from the reservoir. The catheter is placed into a large vein. Medicine that is inserted into the reservoir goes into the catheter and then into the vein.  How will I care for my incision site? Do not get the incision site wet. Bathe or shower as directed by your health care provider. How is my port accessed? Special steps must be taken to access the port:  Before the port is accessed, a numbing cream can be placed on the skin. This helps numb the skin over the port site.  Your health care provider uses a sterile technique to access the port. ? Your health care provider must put on a mask and sterile gloves. ? The skin over your port is cleaned carefully with an antiseptic and allowed to dry. ? The port is gently pinched between sterile gloves, and a needle is inserted into the port.  Only "non-coring" port needles should be used to access the port. Once the port is accessed, a blood return should be checked. This helps ensure that the port  is in the vein and is not clogged.  If your port needs to remain accessed for a constant infusion, a clear (transparent) bandage will be placed over the needle site. The bandage and needle will need to be changed every week, or as directed by your health care provider.  Keep the bandage covering the needle clean and dry. Do not get it wet. Follow your health care provider's instructions on how to take a shower or bath while the port is accessed.  If your port does not need to stay accessed, no bandage is needed over the port.  What is flushing? Flushing helps keep the port from getting clogged. Follow your health care provider's instructions on how and when to flush the port. Ports are usually flushed with saline solution or a medicine called heparin. The need for flushing will depend on how the port is used.  If the port is used for intermittent medicines or blood draws, the port will need to be flushed: ? After medicines have been given. ? After blood has been drawn. ? As part of routine maintenance.  If a constant infusion is running, the port may not need to be flushed.  How long will my port stay implanted? The port can stay in for as long as your health care provider thinks it is needed. When it is time for the port to come out, surgery will be   done to remove it. The procedure is similar to the one performed when the port was put in. When should I seek immediate medical care? When you have an implanted port, you should seek immediate medical care if:  You notice a bad smell coming from the incision site.  You have swelling, redness, or drainage at the incision site.  You have more swelling or pain at the port site or the surrounding area.  You have a fever that is not controlled with medicine.  This information is not intended to replace advice given to you by your health care provider. Make sure you discuss any questions you have with your health care provider. Document  Released: 12/07/2005 Document Revised: 05/14/2016 Document Reviewed: 08/14/2013 Elsevier Interactive Patient Education  2017 [employer]  

## 2017-04-06 NOTE — Progress Notes (Signed)
[name] Kitchen    HEMATOLOGY/ONCOLOGY CLINIC NOTE  Date of Service: .04/06/2017  Patient Care Team: Leeroy Cha, MD as PCP - General (Internal Medicine)  CHIEF COMPLAINTS/PURPOSE OF CONSULTATION:   f/u for continued treatment of Hodgkin's lymphoma  HISTORY OF PRESENTING ILLNESS:   Kim Lane is a wonderful 83 y.o. female who has been referred to [place] by Dr .Leeroy Cha, MD for evaluation and management of newly diagnosed Hodgkin's lymphoma.  Patient has a history of coronary artery disease status post PCI to LAD and diagonal in March 2007, hypertension, dyslipidemia, hypothyroidism, irritable bowel syndrome, suspected COPD, possible CVA in May 2017 (MRI showed chronic microvascular ischemic changes, no acute CVA - patient presented with slurred speech and confusion and facial droop which have resolved). Patient still lives in functions independently. Having some issues with balance but able to ambulate without a walker and cane at this time. No history of falls. She is accompanied by her son Konrad Dolores and daughter [name] for her visit today.  Patient notes that since about May/June 2017 she has had some increased fatigue, decreased appetite, weight loss of 15-20 pounds, intermittent significant night sweats. Has felt chilly but no objective fevers. Has had intermittent skin rash over her trunk area and extremities which have been treated with topical steroids and short burst of oral prednisone by her dermatologist. She reports that she was also told by her doctor should she is getting somewhat more anemic.  In November/December 2017 she noted increased size of right lower neck lymph nodes which were evaluated by her primary care physician. She was empirically treated with amoxicillin with no resolution of her lymph nodes. She was referred for a lymph node biopsy and eventually had her right supraclavicular lymph node biopsy on 12/22/2016.  The results of her lymph node biopsy were  consistent with classical Hodgkin's lymphoma nodular sclerosis type.  She was referred to [place] for further evaluation and management. She has noted some other right neck lymph nodes but does not note any other overt lymphadenopathy. Notes that she feels fatigued and overall other unwell since this started in hopes we can help her. No staging imaging done yet. She was not aware of her diagnosis prior to the clinic visit today. I discussed her diagnosis, planned workup, broadly possible treatment options and other details of her condition and provided reading material to her and her accompanying daughter and son.  Patient still drives and lives independently. She had an echocardiogram in May 2017 with her cardiologist Dr. [name] which showed normal ejection fraction of 60-65% with mild left ventricular hypertrophy and no significant valvular abnormalities. She has had a few episodes of angina that have been treated with nitroglycerin.  She reports that she recently had a chest x-ray for bronchitis like symptoms on 11/25/2016 that showed somewhat hyperinflated lungs with subsegmental ectasis. No acute pneumonia.  She notes that she only smoked for about a year long time ago and had significant exposure to secondhand smoking from husband.  Currently still has a reddish non-raised rash over her back which is quite pruritic.  INTERVAL HISTORY  Patient is here for f/u after his C31D15 of AVD chemotherapy. She notes no acute new symptoms. Notes no nausea/vomiting. Eating well. Some grade 1 fatigue .  No fevers/chills or other focal symptoms suggestive of an infection. Notes that all of the enlarged lymph nodes in her neck have resolved. She is eager to look at the treatment response on her PET scan which is would be due  after the completion of her third cycle in the next week or so. Note some issues with insomnia despite taking melatonin. Okay with trying low dose trazodone when necessary.  Prescription sent.  MEDICAL HISTORY:  Past Medical History:  Diagnosis Date  . CAD (coronary artery disease) 02/2006   Taxus stent placed to LAD and Diagonal per Dr. Olevia Perches  . HTN (hypertension)   . Hypercholesterolemia   . Hypothyroidism   . IBS (irritable bowel syndrome)   . Osteoarthritis   . Stroke [facility]) 03/2016  Osteoporosis Cardiologist Dr. [name] GI -Dr. Thana Farr  SURGICAL HISTORY: Past Surgical History:  Procedure Laterality Date  . APPENDECTOMY    . IR GENERIC HISTORICAL  01/25/2017   IR US GUIDE VASC ACCESS RIGHT 01/25/2017 [name], MD [facility]-INTERV RAD  . IR GENERIC HISTORICAL  01/25/2017   IR FLUORO GUIDE PORT INSERTION RIGHT 01/25/2017 [name], MD [facility]  . MASS EXCISION Right 12/22/2016   Procedure: RIGHT NECK LYMPH NODE BIOPSY;  Surgeon: Rozetta Nunnery, MD;  Location: [place];  Service: ENT;  Laterality: Right;  . stents     in heart  . TOTAL KNEE ARTHROPLASTY Bilateral 2011,2012  . VAGINAL HYSTERECTOMY      SOCIAL HISTORY: Social History   Social History  . Marital status: Widowed    Spouse name: N/A  . Number of children: 2  . Years of education: 12   Occupational History  .  [employer]   Social History Main Topics  . Smoking status: Former Research scientist (life sciences)  . Smokeless tobacco: Never Used     Comment: quit smoking 30 years ago, very light smoker  . Alcohol use No  . Drug use: No  . Sexual activity: Not on file   Other Topics Concern  . Not on file   Social History Narrative   Lives alone   caffeine use- coffee- 5 cups daily    FAMILY HISTORY: Family History  Problem Relation Age of Onset  . Diabetes Mother   . Dementia Mother   . ALS Mother   . Heart Problems Father   . Liver disease Sister   . Cirrhosis Sister   . Heart block      CABG  . Diabetes Sister   . Heart block Son     CABG  . Other [place]  . Other Daughter     [place]  . Dementia Sister       ALLERGIES:  is allergic to fosamax [alendronate sodium].  MEDICATIONS:  Current Outpatient Prescriptions  Medication Sig Dispense Refill  . amoxicillin (AMOXIL) 500 MG capsule 500 mg.    . aspirin 81 MG tablet Take 81 mg by mouth daily.      [name] Kitchen co-enzyme Q-10 30 MG capsule Take 30 mg by mouth daily.     [name] Kitchen dexamethasone (DECADRON) 4 MG tablet Take 1 tablet (4 mg total) by mouth daily. 14 tablet 0  . dexamethasone (DECADRON) 4 MG tablet Take 2 tablets by mouth once a day on the day after chemotherapy and then take 2 tablets two times a day for 2 days. Take with food. 30 tablet 1  . glucosamine-chondroitin 500-400 MG tablet Take 1 tablet by mouth daily as needed (for arthritis).     . iron polysaccharides (NIFEREX) 150 MG capsule Take 1 capsule (150 mg total) by mouth daily. 30 capsule 1  . levothyroxine (SYNTHROID, LEVOTHROID) 88 MCG tablet Take  88 mcg by mouth daily.    [name] Kitchen lidocaine-prilocaine (EMLA) cream Apply to affected area once 30 g 3  . metoprolol succinate (TOPROL-XL) 25 MG 24 hr tablet Take 12.5 mg by mouth every other day.     . Multiple Vitamins-Minerals (CENTRUM SILVER PO) Take 1 tablet by mouth daily.      . nitroGLYCERIN (NITROLINGUAL) 0.4 MG/SPRAY spray Place 1 spray under the tongue every 5 (five) minutes x 3 doses as needed for chest pain (3 sprays max).    . Omega-3 Fatty Acids (FISH OIL) 1000 MG CAPS Take 1 capsule by mouth every other day.     . ondansetron (ZOFRAN) 8 MG tablet Take 1 tablet (8 mg total) by mouth 2 (two) times daily as needed. Start on the third day after chemotherapy. 30 tablet 1  . PROAIR HFA 108 (90 Base) MCG/ACT inhaler     . prochlorperazine (COMPAZINE) 10 MG tablet Take 1 tablet (10 mg total) by mouth every 6 (six) hours as needed (Nausea or vomiting). 30 tablet 1  . RESTASIS 0.05 % ophthalmic emulsion Place 2 drops into both eyes daily as needed. For dry eyes    . simvastatin (ZOCOR) 20 MG tablet Take 20 mg by mouth daily.    [name] Kitchen triamcinolone cream  (KENALOG) 0.1 % Apply 1 application topically daily.      No current facility-administered medications for this visit.     REVIEW OF SYSTEMS:    10 Point review of Systems was done is negative except as noted above.  PHYSICAL EXAMINATION: ECOG PERFORMANCE STATUS: 2 - Symptomatic, <50% confined to bed  . Vitals:   04/06/17 0856  BP: 133/66  Pulse: 85  Resp: 18  Temp: 98.4 F (36.9 C)   Filed Weights   04/06/17 0856  Weight: 119 lb 3.2 oz (54.1 kg)   .Body mass index is 20.46 kg/m.  GENERAL:alert, in no acute distress and comfortable SKIN: skin color, texture, turgor are normal, no rashes or significant lesions EYES: normal, conjunctiva are pink and non-injected, sclera clear OROPHARYNX:no exudate, no erythema and lips, buccal mucosa, and tongue normal  NECK: supple, no JVD, thyroid normal size, non-tender, without nodularity LYMPH:  no palpable lymphadenopathy in the cervical, axillary or inguinal LUNGS: clear to auscultation with normal respiratory effort HEART: regular rate & rhythm,  no murmurs and no lower extrety [place] ABDOMEN: abdomen soft, non-tender, normoactive bowel sounds  Musculoskeletal: no cyanosis of digits and no clubbing  PSYCH: alert & oriented x 3 with fluent speech NEURO: no focal motor/sensory deficits  LABORATORY DATA:  I have reviewed the data as listed  . CBC Latest Ref Rng & Units 04/06/2017 03/23/2017 03/09/2017  WBC 3.9 - 10.3 10e3/uL 26.7(H) 28.0(H) 27.8(H)  Hemoglobin 11.6 - 15.9 g/dL 10.3(L) 9.9(L) 10.2(L)  Hematocrit 34.8 - 46.6 % 30.9(L) 30.8(L) 31.6(L)  Platelets 145 - 400 10e3/uL 509(H) 453(H) 447(H)   . CBC    Component Value Date/Time   WBC 26.7 (H) 04/06/2017 0828   WBC 13.7 (H) 01/25/2017 0807   RBC 3.73 04/06/2017 0828   RBC 3.84 (L) 01/25/2017 0807   HGB 10.3 (L) 04/06/2017 0828   HCT 30.9 (L) 04/06/2017 0828   PLT 509 (H) 04/06/2017 0828   MCV 83.0 04/06/2017 0828   MCH 27.6 04/06/2017 0828   MCH 22.7 (L) 01/25/2017  0807   MCHC 33.2 04/06/2017 0828   MCHC 31.8 01/25/2017 0807   RDW 27.5 (H) 04/06/2017 0828   LYMPHSABS 1.1 04/06/2017 0828   MONOABS  1.2 (H) 04/06/2017 0828   EOSABS 0.8 (H) 04/06/2017 0828   BASOSABS 0.2 (H) 04/06/2017 1950    . CMP Latest Ref Rng & Units 04/06/2017 03/23/2017 03/09/2017  Glucose 70 - 140 mg/dl 114 99 116  BUN 7.0 - 26.0 mg/dL 13.7 10.2 14.9  Creatinine 0.6 - 1.1 mg/dL 0.8 0.7 0.8  Sodium 136 - 145 mEq/L 141 141 141  Potassium 3.5 - 5.1 mEq/L 4.4 4.1 4.1  Chloride 96 - 112 mEq/L - - -  CO2 22 - 29 mEq/L 25 26 27   Calcium 8.4 - 10.4 mg/dL 9.4 9.3 9.2  Total Protein 6.4 - 8.3 g/dL 6.6 6.3(L) 6.5  Total Bilirubin 0.20 - 1.20 mg/dL 0.25 0.22 <0.22  Alkaline Phos 40 - 150 U/L 152(H) 145 145  AST 5 - 34 U/L 24 22 19   ALT 0 - 55 U/L 20 23 17      RADIOGRAPHIC STUDIES: I have personally reviewed the radiological images as listed and agreed with the findings in the report. No results found. Transthoracic Echocardiography  Patient:    Jessa, Stinson MR #:       [id number] Study Date: 06/11/2016 Gender:     F Age:        44 Height:     162.6 cm Weight:     62 kg BSA:        1.68 m^2 Pt. Status: Room:   Jetty Duhamel, M.D.  REFERRING    [name], M.D.  ATTENDING    Loralie Champagne, M.D.  PERFORMING   [facility], Outpatient  SONOGRAPHER  Bethany McMahill, RDCS  cc:  ------------------------------------------------------------------- LV EF: 60% -   65%  ------------------------------------------------------------------- Indications:      TIA (G45.9).  ------------------------------------------------------------------- History:   PMH:   Coronary artery disease.  Stroke.  Risk factors: Family history of coronary artery disease. Former tobacco use. Hypertension. Dyslipidemia.  ------------------------------------------------------------------- Study Conclusions  - Left ventricle: The cavity size was normal. Wall thickness was   increased  in a pattern of mild LVH. Systolic function was normal.   The estimated ejection fraction was in the range of 60% to 65%.   Wall motion was normal; there were no regional wall motion   abnormalities. Doppler parameters are consistent with abnormal   left ventricular relaxation (grade 1 diastolic dysfunction).   Normal strain pattern, GLS -22.7%. - Aortic valve: There was no stenosis. - Mitral valve: Mildly calcified annulus. There was no significant   regurgitation. - Left atrium: The atrium was mildly dilated. - Right ventricle: The cavity size was normal. Systolic function   was normal. - Pulmonary arteries: PA peak pressure: 19 mm Hg (S). - Inferior vena cava: The vessel was normal in size. The   respirophasic diameter changes were in the normal range (= 50%),   consistent with normal central venous pressure.  Impressions:  - Normal LV size and systolic function, EF 80-99%. Mild LV   hypertrophy. Normal RV size and systolic function. No significant   valvular abnormalities.   ASSESSMENT & PLAN:   83 year old Caucasian female with ECOG performance status of 2 with  1) Classical Hodgkin's lymphoma of nodular sclerosis variety . Atleast Stage IIIB some concern for possible Rt lung involvement which make in Stage IVB Presented with right neck lymph nodes . She had type B constitutional symptoms with 15-20 pound weight loss night sweats or chills .noted to have significant pruritus with mild rash likely from Hodgkin's Wt Readings from Last 3  Encounters:  04/06/17 119 lb 3.2 oz (54.1 kg)  02/23/17 119 lb 12.8 oz (54.3 kg)  02/02/17 116 lb 11.2 oz (52.9 kg)  - her constitutional symptoms, pruritus and rash have resolved. Rt neck swelling Appears to have resolved. 2) Microcytic anemia - likely anemia of chronic disease due to Hodgkin's lymphoma .  No evidence of iron def. Improved after PRBC transfusion . Stable. Now partly from chemotherapy 3) Neutrophilic leucocytosis - likely  from steroid and Neulasta. No evidence of infection. PLAN - labs stable. No prohibitive toxicities -continue AVD with G-CSF support. -infection prevention strategies discussed.  -PET/CT scan in the next 1 week to reevaluate treatment response after 3 cycles of treatment. -she is eating better and was counseled to optimize po food intake. -counseled to use walker and cane - to pursue fall precautions -constipation - resolved with Miralax. Has IBS at baseline -Given trazodone 25-50 mg when necessary for insomnia  3) . Patient Active Problem List   Diagnosis Date Noted  . Port catheter in place 02/02/2017  . Nodular sclerosis Hodgkin lymphoma of lymph nodes of neck ([place]) 01/13/2017  . Dyspnea 09/14/2012  . Epistaxis 06/02/2011  . HYPERCHOLESTEROLEMIA, MIXED 01/08/2009  . ESSENTIAL HYPERTENSION, BENIGN 01/08/2009  . Coronary atherosclerosis 01/08/2009  . HYPOTHYROIDISM, HX OF 01/08/2009  PLAN -Continue follow-up with primary care physician for management of other chronic medical co-morbidities.   -continue chemotherapy as per schedule. -continue labs q2week with chemotherapy  -RTC with Dr [name] C4D1 with lab with PET/CT  All of the patients questions were answered with apparent satisfaction. The patient knows to call the clinic with any problems, questions or concerns.  I spent 20 minutes counseling the patient face to face. The total time spent in the appointment was 25 minutes and more than 50% was on counseling and direct patient cares.    Sullivan Lone MD [place] AAHIVMS [facility] Hematology/Oncology Physician [facility]  (Office):       [phone] (Work cell):  [phone] (Fax):           [phone]

## 2017-04-19 ENCOUNTER — Other Ambulatory Visit: Payer: Self-pay | Admitting: *Deleted

## 2017-04-19 ENCOUNTER — Encounter (HOSPITAL_COMMUNITY)
Admission: RE | Admit: 2017-04-19 | Discharge: 2017-04-19 | Disposition: A | Discharge status: Home / Self Care | Payer: Medicare Other | Source: Ambulatory Visit | Attending: Hematology | Admitting: Hematology

## 2017-04-19 DIAGNOSIS — C8111 Nodular sclerosis classical Hodgkin lymphoma, lymph nodes of head, face, and neck: Secondary | ICD-10-CM

## 2017-04-19 LAB — GLUCOSE, CAPILLARY: Glucose-Capillary: 117 mg/dL — ABNORMAL HIGH (ref 65–99)

## 2017-04-19 MED ORDER — FLUDEOXYGLUCOSE F - 18 (FDG) INJECTION
5.7600 | Freq: Once | INTRAVENOUS | Status: AC | PRN
  Administered 2017-04-19: 5.76 via INTRAVENOUS

## 2017-04-19 MED ORDER — DEXAMETHASONE 4 MG PO TABS: ORAL_TABLET | ORAL | 1 refills | Status: DC

## 2017-04-20 ENCOUNTER — Telehealth: Payer: Self-pay | Admitting: Hematology

## 2017-04-20 ENCOUNTER — Ambulatory Visit: Payer: Medicare Other

## 2017-04-20 ENCOUNTER — Other Ambulatory Visit: Payer: Medicare Other

## 2017-04-20 ENCOUNTER — Ambulatory Visit (HOSPITAL_BASED_OUTPATIENT_CLINIC_OR_DEPARTMENT_OTHER): Payer: Medicare Other | Admitting: Hematology

## 2017-04-20 ENCOUNTER — Other Ambulatory Visit (HOSPITAL_BASED_OUTPATIENT_CLINIC_OR_DEPARTMENT_OTHER): Payer: Medicare Other

## 2017-04-20 ENCOUNTER — Encounter: Payer: Self-pay | Admitting: Hematology

## 2017-04-20 ENCOUNTER — Ambulatory Visit (HOSPITAL_BASED_OUTPATIENT_CLINIC_OR_DEPARTMENT_OTHER): Payer: Medicare Other

## 2017-04-20 VITALS — BP 139/68 | HR 87 | Temp 98.3°F | Resp 16 | Wt 119.4 lb

## 2017-04-20 DIAGNOSIS — C8111 Nodular sclerosis classical Hodgkin lymphoma, lymph nodes of head, face, and neck: Secondary | ICD-10-CM

## 2017-04-20 DIAGNOSIS — Z5111 Encounter for antineoplastic chemotherapy: Secondary | ICD-10-CM

## 2017-04-20 DIAGNOSIS — R21 Rash and other nonspecific skin eruption: Secondary | ICD-10-CM

## 2017-04-20 DIAGNOSIS — Z95828 Presence of other vascular implants and grafts: Secondary | ICD-10-CM

## 2017-04-20 DIAGNOSIS — L299 Pruritus, unspecified: Secondary | ICD-10-CM | POA: Diagnosis not present

## 2017-04-20 LAB — CBC & DIFF AND RETIC
BASO%: 0.7 % (ref 0.0–2.0)
Basophils Absolute: 0.2 10*3/uL — ABNORMAL HIGH (ref 0.0–0.1)
EOS%: 2.4 % (ref 0.0–7.0)
Eosinophils Absolute: 0.6 10*3/uL — ABNORMAL HIGH (ref 0.0–0.5)
HCT: 30.4 % — ABNORMAL LOW (ref 34.8–46.6)
HGB: 9.7 g/dL — ABNORMAL LOW (ref 11.6–15.9)
Immature Retic Fract: 13.5 % — ABNORMAL HIGH (ref 1.60–10.00)
LYMPH%: 3.9 % — ABNORMAL LOW (ref 14.0–49.7)
MCH: 28 pg (ref 25.1–34.0)
MCHC: 31.9 g/dL (ref 31.5–36.0)
MCV: 87.6 fL (ref 79.5–101.0)
MONO#: 1.7 10*3/uL — ABNORMAL HIGH (ref 0.1–0.9)
MONO%: 6.4 % (ref 0.0–14.0)
NEUT#: 22.8 10*3/uL — ABNORMAL HIGH (ref 1.5–6.5)
NEUT%: 86.6 % — ABNORMAL HIGH (ref 38.4–76.8)
Platelets: 447 10*3/uL — ABNORMAL HIGH (ref 145–400)
RBC: 3.47 10*6/uL — ABNORMAL LOW (ref 3.70–5.45)
RDW: 23.3 % — ABNORMAL HIGH (ref 11.2–14.5)
Retic %: 3.26 % — ABNORMAL HIGH (ref 0.70–2.10)
Retic Ct Abs: 113.12 10*3/uL — ABNORMAL HIGH (ref 33.70–90.70)
WBC: 26.4 10*3/uL — ABNORMAL HIGH (ref 3.9–10.3)
lymph#: 1 10*3/uL (ref 0.9–3.3)

## 2017-04-20 LAB — COMPREHENSIVE METABOLIC PANEL
ALT: 16 U/L (ref 0–55)
AST: 18 U/L (ref 5–34)
Albumin: 3.4 g/dL — ABNORMAL LOW (ref 3.5–5.0)
Alkaline Phosphatase: 143 U/L (ref 40–150)
Anion Gap: 9 mEq/L (ref 3–11)
BUN: 12.6 mg/dL (ref 7.0–26.0)
CO2: 26 mEq/L (ref 22–29)
Calcium: 9 mg/dL (ref 8.4–10.4)
Chloride: 106 mEq/L (ref 98–109)
Creatinine: 0.8 mg/dL (ref 0.6–1.1)
EGFR: 68 mL/min/{1.73_m2} — ABNORMAL LOW (ref >90)
Glucose: 104 mg/dl (ref 70–140)
Potassium: 4.1 mEq/L (ref 3.5–5.1)
Sodium: 141 mEq/L (ref 136–145)
Total Bilirubin: 0.24 mg/dL (ref 0.20–1.20)
Total Protein: 6.3 g/dL — ABNORMAL LOW (ref 6.4–8.3)

## 2017-04-20 MED ORDER — SODIUM CHLORIDE 0.9% FLUSH
10.0000 mL | INTRAVENOUS | Status: DC | PRN
  Administered 2017-04-20: 10 mL
  Filled 2017-04-20: qty 10

## 2017-04-20 MED ORDER — CLOTRIMAZOLE-BETAMETHASONE 1-0.05 % EX CREA: 1.0000 "application " | TOPICAL_CREAM | Freq: Two times a day (BID) | CUTANEOUS | 0 refills | Status: AC

## 2017-04-20 MED ORDER — SODIUM CHLORIDE 0.9 % IV SOLN
Freq: Once | INTRAVENOUS | Status: AC
  Administered 2017-04-20: 13:00:00 via INTRAVENOUS

## 2017-04-20 MED ORDER — SODIUM CHLORIDE 0.9% FLUSH
10.0000 mL | Freq: Once | INTRAVENOUS | Status: AC
  Administered 2017-04-20: 10 mL via INTRAVENOUS
  Filled 2017-04-20: qty 10

## 2017-04-20 MED ORDER — FOSAPREPITANT DIMEGLUMINE INJECTION 150 MG
Freq: Once | INTRAVENOUS | Status: AC
  Administered 2017-04-20: 13:00:00 via INTRAVENOUS
  Filled 2017-04-20: qty 5

## 2017-04-20 MED ORDER — PALONOSETRON HCL INJECTION 0.25 MG/5ML
0.2500 mg | Freq: Once | INTRAVENOUS | Status: AC
  Administered 2017-04-20: 0.25 mg via INTRAVENOUS

## 2017-04-20 MED ORDER — SODIUM CHLORIDE 0.9 % IV SOLN
375.0000 mg/m2 | Freq: Once | INTRAVENOUS | Status: AC
  Administered 2017-04-20: 600 mg via INTRAVENOUS
  Filled 2017-04-20: qty 30

## 2017-04-20 MED ORDER — PEGFILGRASTIM 6 MG/0.6ML ~~LOC~~ PSKT
6.0000 mg | PREFILLED_SYRINGE | Freq: Once | SUBCUTANEOUS | Status: AC
  Administered 2017-04-20: 6 mg via SUBCUTANEOUS
  Filled 2017-04-20: qty 0.6

## 2017-04-20 MED ORDER — HEPARIN SOD (PORK) LOCK FLUSH 100 UNIT/ML IV SOLN
500.0000 [IU] | Freq: Once | INTRAVENOUS | Status: AC | PRN
  Administered 2017-04-20: 500 [IU]
  Filled 2017-04-20: qty 5

## 2017-04-20 MED ORDER — PALONOSETRON HCL INJECTION 0.25 MG/5ML
INTRAVENOUS | Status: AC
  Filled 2017-04-20: qty 5

## 2017-04-20 MED ORDER — SODIUM CHLORIDE 0.9 % IV SOLN
6.3000 mg/m2 | Freq: Once | INTRAVENOUS | Status: AC
  Administered 2017-04-20: 10 mg via INTRAVENOUS
  Filled 2017-04-20: qty 10

## 2017-04-20 MED ORDER — DOXORUBICIN HCL CHEMO IV INJECTION 2 MG/ML
25.0000 mg/m2 | Freq: Once | INTRAVENOUS | Status: AC
  Administered 2017-04-20: 40 mg via INTRAVENOUS
  Filled 2017-04-20: qty 20

## 2017-04-20 NOTE — Patient Instructions (Signed)
Implanted Port Home Guide An implanted port is a type of central line that is placed under the skin. Central lines are used to provide IV access when treatment or nutrition needs to be given through a person's veins. Implanted ports are used for long-term IV access. An implanted port may be placed because:  You need IV medicine that would be irritating to the small veins in your hands or arms.  You need long-term IV medicines, such as antibiotics.  You need IV nutrition for a long period.  You need frequent blood draws for lab tests.  You need dialysis.  Implanted ports are usually placed in the chest area, but they can also be placed in the upper arm, the abdomen, or the leg. An implanted port has two main parts:  Reservoir. The reservoir is round and will appear as a small, raised area under your skin. The reservoir is the part where a needle is inserted to give medicines or draw blood.  Catheter. The catheter is a thin, flexible tube that extends from the reservoir. The catheter is placed into a large vein. Medicine that is inserted into the reservoir goes into the catheter and then into the vein.  How will I care for my incision site? Do not get the incision site wet. Bathe or shower as directed by your health care provider. How is my port accessed? Special steps must be taken to access the port:  Before the port is accessed, a numbing cream can be placed on the skin. This helps numb the skin over the port site.  Your health care provider uses a sterile technique to access the port. ? Your health care provider must put on a mask and sterile gloves. ? The skin over your port is cleaned carefully with an antiseptic and allowed to dry. ? The port is gently pinched between sterile gloves, and a needle is inserted into the port.  Only "non-coring" port needles should be used to access the port. Once the port is accessed, a blood return should be checked. This helps ensure that the port  is in the vein and is not clogged.  If your port needs to remain accessed for a constant infusion, a clear (transparent) bandage will be placed over the needle site. The bandage and needle will need to be changed every week, or as directed by your health care provider.  Keep the bandage covering the needle clean and dry. Do not get it wet. Follow your health care provider's instructions on how to take a shower or bath while the port is accessed.  If your port does not need to stay accessed, no bandage is needed over the port.  What is flushing? Flushing helps keep the port from getting clogged. Follow your health care provider's instructions on how and when to flush the port. Ports are usually flushed with saline solution or a medicine called heparin. The need for flushing will depend on how the port is used.  If the port is used for intermittent medicines or blood draws, the port will need to be flushed: ? After medicines have been given. ? After blood has been drawn. ? As part of routine maintenance.  If a constant infusion is running, the port may not need to be flushed.  How long will my port stay implanted? The port can stay in for as long as your health care provider thinks it is needed. When it is time for the port to come out, surgery will be   done to remove it. The procedure is similar to the one performed when the port was put in. When should I seek immediate medical care? When you have an implanted port, you should seek immediate medical care if:  You notice a bad smell coming from the incision site.  You have swelling, redness, or drainage at the incision site.  You have more swelling or pain at the port site or the surrounding area.  You have a fever that is not controlled with medicine.  This information is not intended to replace advice given to you by your health care provider. Make sure you discuss any questions you have with your health care provider. Document  Released: 12/07/2005 Document Revised: 05/14/2016 Document Reviewed: 08/14/2013 Elsevier Interactive Patient Education  2017 [employer]  

## 2017-04-20 NOTE — Progress Notes (Signed)
Nutrition Follow-up:  Nutrition follow-up completed during infusion this pm.  Patient receiving chemotherapy for hodgkin's lymphoma.  She is a patient of Dr. [name]  Patient reports she got a good report today from her PET scan that chemotherapy is working.  Patient reports that her appetite is coming back.  Getting ready to eat a [place] sandwich and chips for lunch during infusion.  Reports she ate eggs, bacon, toast and cantelope this am for breakfast.  Reports she drinks ensure/boost but not every day.    No other nutrition symptoms mentioned today.   Medications: reviewed  Labs: reviewed  Anthropometrics:   Weight today 119 lb 7 oz stable for the last few months.     NUTRITION DIAGNOSIS: Unintentional weight loss stable   MALNUTRITION DIAGNOSIS: Severe malnutrition stable   INTERVENTION:   Celebrated with patient about good news from PET scan.   Encouraged patient to continue with good nutrition to improve strength, energy and weight. Encouraged continued use of oral nutrition supplements    MONITORING, EVALUATION, GOAL: Patient will tolerate increased calories and protein to minimize further weight loss.    NEXT VISIT: as needed  Ehsan Corvin B. Zenia Resides, [place], [place] Registered Dietitian [phone] (pager)

## 2017-04-20 NOTE — Telephone Encounter (Signed)
Scheduled appts per 5/1 los. - Patient called to the treatment area before schedule was finished - patient aware and will get a new schedule in the treatment area. Per GK okay to schedule C5D1 on 5/30 due to the holidays

## 2017-04-20 NOTE — Patient Instructions (Addendum)
Dexamethasone refill has been sent your pharmacy.  Please take dexamethasone for 4 days following your chemotherapy.  Topical ointment will be sent to your pharmacy for your skin rash.  Thank you for choosing [facility] to provide your oncology and hematology care.  To afford each patient quality time with our providers, please arrive 30 minutes before your scheduled appointment time.  If you arrive late for your appointment, you may be asked to reschedule.  We strive to give you quality time with our providers, and arriving late affects you and other patients whose appointments are after yours.  If you are a no show for multiple scheduled visits, you may be dismissed from the clinic at the providers discretion.   Again, thank you for choosing [facility], our hope is that these requests will decrease the amount of time that you wait before being seen by our physicians.  ______________________________________________________________________ Should you have questions after your visit to the [facility], please contact our office at [phone] between the hours of 8:30 and 4:30 p.m.    Voicemails left after 4:30p.m will not be returned until the following business day.   For prescription refill requests, please have your pharmacy contact us directly.  Please also try to allow 48 hours for prescription requests.   Please contact the scheduling department for questions regarding scheduling.  For scheduling of procedures such as PET scans, CT scans, MRI, Ultrasound, etc please contact central scheduling at [phone].   Resources For Cancer Patients and Caregivers:  American Cancer Society:  4125986560  Can help patients locate various types of support and financial assistance Cancer Care: 1-800-813-HOPE 2676072876) Provides financial assistance, online support groups, medication/co-pay assistance.   [place]:  2367777951 Where to apply for  food stamps, Medicaid, and utility assistance Medicare Rights Center: (802)226-1258 Helps people with Medicare understand their rights and benefits, navigate the Medicare system, and secure the quality healthcare they deserve SCAT: [place] [employer]'s shared-ride transportation service for eligible riders who have a disability that prevents them from riding the fixed route bus.   For additional information on assistance programs please contact our social worker:   [place]:  [phone]

## 2017-04-20 NOTE — Progress Notes (Signed)
[name] Kitchen    HEMATOLOGY/ONCOLOGY CLINIC NOTE  Date of Service: .04/20/2017  Patient Care Team: Leeroy Cha, MD as PCP - General (Internal Medicine)  CHIEF COMPLAINTS/PURPOSE OF CONSULTATION:   f/u for continued treatment of Hodgkin's lymphoma  HISTORY OF PRESENTING ILLNESS:   plz see previous   INTERVAL HISTORY  Patient is here for f/u prior to starting her fourth cycle of AVD for her Hodgkin's lymphoma . She had a PET scan after completion of 3 cycles which shows near complete metabolic response of her disease . Images were reviewed with the patient and her family and she is glad about this. She notes a dry scaly reddish spots over the upper extremities on a few on the trunk. Dry skin. Her skin is fairly delicate due to her age. Could be from chemotherapy. Could not rule out sweet syndrome. Discussed reducing or dropping Neulasta dose versus  Monitoring her rash and she prefers the latter. No fevers/chills or other focal symptoms suggestive of an infection. Patient notes she is sleeping better with trazodone and feels better. Good overall strength. Eating well. Looking forward to her grandson's wedding in the next couple of weeks.  MEDICAL HISTORY:  Past Medical History:  Diagnosis Date  . CAD (coronary artery disease) 02/2006   Taxus stent placed to LAD and Diagonal per Dr. Olevia Perches  . HTN (hypertension)   . Hypercholesterolemia   . Hypothyroidism   . IBS (irritable bowel syndrome)   . Osteoarthritis   . Stroke [facility]) 03/2016  Osteoporosis Cardiologist Dr. [name] -Dr. Thana Farr  SURGICAL HISTORY: Past Surgical History:  Procedure Laterality Date  . APPENDECTOMY    . IR GENERIC HISTORICAL  01/25/2017   IR US GUIDE VASC ACCESS RIGHT 01/25/2017 [name], MD [facility]  . IR GENERIC HISTORICAL  01/25/2017   IR FLUORO GUIDE PORT INSERTION RIGHT 01/25/2017 [name], MD [facility]  . MASS EXCISION Right 12/22/2016   Procedure: RIGHT NECK LYMPH NODE BIOPSY;   Surgeon: Rozetta Nunnery, MD;  Location: [facility];  Service: ENT;  Laterality: Right;  . stents     in heart  . TOTAL KNEE ARTHROPLASTY Bilateral 2011,2012  . VAGINAL HYSTERECTOMY      SOCIAL HISTORY: Social History   Social History  . Marital status: Widowed    Spouse name: N/A  . Number of children: 2  . Years of education: 12   Occupational History  .  [employer]   Social History Main Topics  . Smoking status: Former Research scientist (life sciences)  . Smokeless tobacco: Never Used     Comment: quit smoking 30 years ago, very light smoker  . Alcohol use No  . Drug use: No  . Sexual activity: Not on file   Other Topics Concern  . Not on file   Social History Narrative   Lives alone   caffeine use- coffee- 5 cups daily    FAMILY HISTORY: Family History  Problem Relation Age of Onset  . Diabetes Mother   . Dementia Mother   . ALS Mother   . Heart Problems Father   . Liver disease Sister   . Cirrhosis Sister   . Heart block      CABG  . Diabetes Sister   . Heart block Son     CABG  . Other [place]  . Other Daughter     [place]  . Dementia Sister     ALLERGIES:  is allergic to fosamax [alendronate sodium].  MEDICATIONS:  Current Outpatient Prescriptions  Medication Sig Dispense Refill  . amoxicillin (AMOXIL) 500 MG capsule 500 mg.    . aspirin 81 MG tablet Take 81 mg by mouth daily.      . clotrimazole-betamethasone (LOTRISONE) cream Apply 1 application topically 2 (two) times daily. To area of skin rash on extremities and trunk. (not on face) 30 g 0  . co-enzyme Q-10 30 MG capsule Take 30 mg by mouth daily.     [name] Kitchen dexamethasone (DECADRON) 4 MG tablet Take 1 tablet (4 mg total) by mouth daily. 14 tablet 0  . dexamethasone (DECADRON) 4 MG tablet Take 2 tablets by mouth once a day on the day after chemotherapy and then take 2 tablets two times a day for 2 days. Take with food. 30 tablet 1  .  glucosamine-chondroitin 500-400 MG tablet Take 1 tablet by mouth daily as needed (for arthritis).     . iron polysaccharides (NIFEREX) 150 MG capsule Take 1 capsule (150 mg total) by mouth daily. 30 capsule 1  . levothyroxine (SYNTHROID, LEVOTHROID) 88 MCG tablet Take 88 mcg by mouth daily.    [name] Kitchen lidocaine-prilocaine (EMLA) cream Apply to affected area once 30 g 3  . metoprolol succinate (TOPROL-XL) 25 MG 24 hr tablet Take 12.5 mg by mouth every other day.     . Multiple Vitamins-Minerals (CENTRUM SILVER PO) Take 1 tablet by mouth daily.      . nitroGLYCERIN (NITROLINGUAL) 0.4 MG/SPRAY spray Place 1 spray under the tongue every 5 (five) minutes x 3 doses as needed for chest pain (3 sprays max).    . Omega-3 Fatty Acids (FISH OIL) 1000 MG CAPS Take 1 capsule by mouth every other day.     . ondansetron (ZOFRAN) 8 MG tablet Take 1 tablet (8 mg total) by mouth 2 (two) times daily as needed. Start on the third day after chemotherapy. 30 tablet 1  . PROAIR HFA 108 (90 Base) MCG/ACT inhaler     . prochlorperazine (COMPAZINE) 10 MG tablet Take 1 tablet (10 mg total) by mouth every 6 (six) hours as needed (Nausea or vomiting). 30 tablet 1  . RESTASIS 0.05 % ophthalmic emulsion Place 2 drops into both eyes daily as needed. For dry eyes    . simvastatin (ZOCOR) 20 MG tablet Take 20 mg by mouth daily.    . traZODone (DESYREL) 50 MG tablet Take 0.5-1 tablets (25-50 mg total) by mouth at bedtime as needed for sleep. 30 tablet 0  . triamcinolone cream (KENALOG) 0.1 % Apply 1 application topically daily.      No current facility-administered medications for this visit.    Facility-Administered Medications Ordered in Other Visits  Medication Dose Route Frequency Provider Last Rate Last Dose  . 0.9 %  sodium chloride infusion   Intravenous Once Brunetta Genera, MD      . dacarbazine (DTIC) 600 mg in sodium chloride 0.9 % 250 mL chemo infusion  375 mg/m2 (Treatment Plan Recorded) Intravenous Once Brunetta Genera, MD      . DOXOrubicin (ADRIAMYCIN) chemo injection 40 mg  25 mg/m2 (Treatment Plan Recorded) Intravenous Once Brunetta Genera, MD      . fosaprepitant (EMEND) 150 mg, dexamethasone (DECADRON) 12 mg in sodium chloride 0.9 % 145 mL IVPB   Intravenous Once Brunetta Genera, MD      . heparin lock flush 100 unit/mL  500 Units Intracatheter Once PRN Brunetta Genera, MD      .  palonosetron (ALOXI) injection 0.25 mg  0.25 mg Intravenous Once Brunetta Genera, MD      . pegfilgrastim (NEULASTA ONPRO KIT) injection 6 mg  6 mg Subcutaneous Once Brunetta Genera, MD      . sodium chloride flush (NS) 0.9 % injection 10 mL  10 mL Intracatheter PRN Brunetta Genera, MD      . vinBLAStine (VELBAN) 10 mg in sodium chloride 0.9 % 50 mL chemo infusion  6.3 mg/m2 (Treatment Plan Recorded) Intravenous Once Brunetta Genera, MD        REVIEW OF SYSTEMS:    10 Point review of Systems was done is negative except as noted above.  PHYSICAL EXAMINATION: ECOG PERFORMANCE STATUS: 2 - Symptomatic, <50% confined to bed  Vital signs reviewed in Epic  GENERAL:alert, in no acute distress and comfortable SKIN:Dry skin with some areas with scaly itchy. Not on the face or hands . Predominantly on the upper extremities . Areas of senile purpura . EYES: normal, conjunctiva are pink and non-injected, sclera clear OROPHARYNX:no exudate, no erythema and lips, buccal mucosa, and tongue normal  NECK: supple, no JVD, thyroid normal size, non-tender, without nodularity LYMPH:  no palpable lymphadenopathy in the cervical, axillary or inguinal LUNGS: clear to auscultation with normal respiratory effort HEART: regular rate & rhythm,  no murmurs and no lower extrety edem ABDOMEN: abdomen soft, non-tender, normoactive bowel sounds  Musculoskeletal: no cyanosis of digits and no clubbing  PSYCH: alert & oriented x 3 with fluent speech NEURO: no focal motor/sensory deficits  LABORATORY DATA:  I have reviewed  the data as listed   . CBC Latest Ref Rng & Units 04/20/2017 04/06/2017 03/23/2017  WBC 3.9 - 10.3 10e3/uL 26.4(H) 26.7(H) 28.0(H)  Hemoglobin 11.6 - 15.9 g/dL 9.7(L) 10.3(L) 9.9(L)  Hematocrit 34.8 - 46.6 % 30.4(L) 30.9(L) 30.8(L)  Platelets 145 - 400 10e3/uL 447(H) 509(H) 453(H)  . CBC    Component Value Date/Time   WBC 26.4 (H) 04/20/2017 1038   WBC 13.7 (H) 01/25/2017 0807   RBC 3.47 (L) 04/20/2017 1038   RBC 3.84 (L) 01/25/2017 0807   HGB 9.7 (L) 04/20/2017 1038   HCT 30.4 (L) 04/20/2017 1038   PLT 447 (H) 04/20/2017 1038   MCV 87.6 04/20/2017 1038   MCH 28.0 04/20/2017 1038   MCH 22.7 (L) 01/25/2017 0807   MCHC 31.9 04/20/2017 1038   MCHC 31.8 01/25/2017 0807   RDW 23.3 (H) 04/20/2017 1038   LYMPHSABS 1.0 04/20/2017 1038   MONOABS 1.7 (H) 04/20/2017 1038   EOSABS 0.6 (H) 04/20/2017 1038   BASOSABS 0.2 (H) 04/20/2017 1038    CMP Latest Ref Rng & Units 04/20/2017 04/06/2017 03/23/2017  Glucose 70 - 140 mg/dl 104 114 99  BUN 7.0 - 26.0 mg/dL 12.6 13.7 10.2  Creatinine 0.6 - 1.1 mg/dL 0.8 0.8 0.7  Sodium 136 - 145 mEq/L 141 141 141  Potassium 3.5 - 5.1 mEq/L 4.1 4.4 4.1  Chloride 96 - 112 mEq/L - - -  CO2 22 - 29 mEq/L '26 25 26  '$ Calcium 8.4 - 10.4 mg/dL 9.0 9.4 9.3  Total Protein 6.4 - 8.3 g/dL 6.3(L) 6.6 6.3(L)  Total Bilirubin 0.20 - 1.20 mg/dL 0.24 0.25 0.22  Alkaline Phos 40 - 150 U/L 143 152(H) 145  AST 5 - 34 U/L '18 24 22  '$ ALT 0 - 55 U/L '16 20 23     '$ RADIOGRAPHIC STUDIES: I have personally reviewed the radiological images as listed and agreed with the findings in  the report. Nm Pet Image Restag (ps) Skull Base To Thigh  Result Date: 04/19/2017 CLINICAL DATA:  Subsequent treatment strategy for restaging of lymphoma. Status post 3 cycles of chemotherapy. EXAM: NUCLEAR MEDICINE PET SKULL BASE TO THIGH TECHNIQUE: 5 point a MCi F-18 FDG was injected intravenously. Full-ring PET imaging was performed from the skull base to thigh after the radiotracer. CT data was obtained  and used for attenuation correction and anatomic localization. FASTING BLOOD GLUCOSE:  Value: 117 mg/dl COMPARISON:  01/14/2017 FINDINGS: NECK Interval resolution of low cervical hypermetabolic adenopathy. Index right supraclavicular nodes measure maximally 5 mm and a S.U.V. max of 1.0 today versus 1.3 cm and a S.U.V. max of 7.2 on the prior exam. Carotid atherosclerosis. CHEST Node at the right-sided thoracic inlet Measures 1.4 cm and is no longer hypermetabolic. Compare 2.0 cm and a S.U.V. max of 14.2 on the prior exam. Right paratracheal node measures 2.0 cm and a S.U.V. max of 2.7 versus 2.1 cm and a S.U.V. max of 19.9. Borderline cardiomegaly. Multivessel coronary artery atherosclerosis. Tiny hiatal hernia. 4 mm right lower lobe pulmonary nodule is unchanged on image 48/series 7. Previously described right-sided reticulonodular opacification has resolved. ABDOMEN/PELVIS Index left periaortic node measures 12 mm and a S.U.V. max of 1.7 on image 105/series 4. Compare 2.3 cm and a S.U.V. max of 5.4 on the prior exam. No pelvic adenopathy. Abdominal aortic atherosclerosis. Colonic stool burden suggests constipation. Hysterectomy. SKELETON Diffuse marrow hypermetabolism is mild and most likely related to marrow stimulation by chemotherapy. No focal osseous lesion. IMPRESSION: 1. Marked response to therapy. Residual thoracoabdominal adenopathy, without malignant range hypermetabolism. 2.  Coronary artery atherosclerosis. Aortic atherosclerosis. 3.  Possible constipation. Electronically Signed   By: Abigail Miyamoto M.D.   On: 04/19/2017 10:32   Transthoracic Echocardiography  Patient:    Kim Lane, Kim Lane MR #:       [id number] Study Date: 06/11/2016 Gender:     F Age:        83 Height:     162.6 cm Weight:     62 kg BSA:        1.68 m^2 Pt. Status: Room:   Jetty Duhamel, M.D.  REFERRING    [name], M.D.  ATTENDING    Loralie Champagne, M.D.  PERFORMING   [facility], Outpatient  SONOGRAPHER   Bethany McMahill, RDCS  cc:  ------------------------------------------------------------------- LV EF: 60% -   65%  ------------------------------------------------------------------- Indications:      TIA (G45.9).  ------------------------------------------------------------------- History:   PMH:   Coronary artery disease.  Stroke.  Risk factors: Family history of coronary artery disease. Former tobacco use. Hypertension. Dyslipidemia.  ------------------------------------------------------------------- Study Conclusions  - Left ventricle: The cavity size was normal. Wall thickness was   increased in a pattern of mild LVH. Systolic function was normal.   The estimated ejection fraction was in the range of 60% to 65%.   Wall motion was normal; there were no regional wall motion   abnormalities. Doppler parameters are consistent with abnormal   left ventricular relaxation (grade 1 diastolic dysfunction).   Normal strain pattern, GLS -22.7%. - Aortic valve: There was no stenosis. - Mitral valve: Mildly calcified annulus. There was no significant   regurgitation. - Left atrium: The atrium was mildly dilated. - Right ventricle: The cavity size was normal. Systolic function   was normal. - Pulmonary arteries: PA peak pressure: 19 mm Hg (S). - Inferior vena cava: The vessel was normal in size. The  respirophasic diameter changes were in the normal range (= 50%),   consistent with normal central venous pressure.  Impressions:  - Normal LV size and systolic function, EF 16-10%. Mild LV   hypertrophy. Normal RV size and systolic function. No significant   valvular abnormalities.   ASSESSMENT & PLAN:   83 year old Caucasian female with ECOG performance status of 2 with  1) Classical Hodgkin's lymphoma of nodular sclerosis variety . Atleast Stage IIIB some concern for possible Rt lung involvement which make in Stage IVB Presented with right neck lymph nodes . She had  type B constitutional symptoms with 15-20 pound weight loss night sweats or chills .noted to have significant pruritus with mild rash likely from Hodgkin's  Wt Readings from Last 3 Encounters:  04/20/17 119 lb 7 oz (54.2 kg)  04/06/17 119 lb 3.2 oz (54.1 kg)  02/23/17 119 lb 12.8 oz (54.3 kg)  - her constitutional symptoms, pruritus have resolved. Rt neck swelling Appears to have resolved. PET/CT scan after 3 cycles shows marked response to chemotherapy.  2) Microcytic anemia - likely anemia of chronic disease due to Hodgkin's lymphoma .  No evidence of iron def. Improved after PRBC transfusion . Stable. Now partly from chemotherapy 3) Neutrophilic leucocytosis - likely from steroid and Neulasta. No evidence of infection. PLAN - labs stable. No prohibitive toxicities -continue AVD with G-CSF support. -We discussed holding or cutting down dose of Neulasta but patient is hesitant to do this. She has a grand son that is getting married and does not want to rock the boat on her treatment. -We discussed that we might likely only to 5 cycles instead of 6 cycles of AVD given good response to treatment thus far. -infection prevention strategies discussed.  -PET/CT scan  results were discussed in detail-and images were reviewed with her and her family. She is happy with the treatment response thus far. she is eating better and was counseled to optimize po food intake. -counseled to use walker and cane - to pursue fall precautions -constipation - resolved with Miralax. Has IBS at baseline -Given trazodone 25-50 mg when necessary for insomnia - patient notes this has really helped her sleep.  4) Rash and pruritus  -Grade 1 likely related to chemotherapy. Sweet syndrome is on the differential diagnosis. -Patient recommended to use the dexamethasone for 4-5 days after chemotherapy as opposed to only 2 days. -She was also given a prescription for topical letrozole on to apply over the rash on the  extremities and trunk. -She was recommended take on the short lukewarm water showers  And use generous amounts of moisturizer. -Recommended not to scratch her skin, this could make it worse.  -Patient will inform us if the rash gets worse   3) . Patient Active Problem List   Diagnosis Date Noted  . Port catheter in place 02/02/2017  . Nodular sclerosis Hodgkin lymphoma of lymph nodes of neck ([place]) 01/13/2017  . Dyspnea 09/14/2012  . Epistaxis 06/02/2011  . HYPERCHOLESTEROLEMIA, MIXED 01/08/2009  . ESSENTIAL HYPERTENSION, BENIGN 01/08/2009  . Coronary atherosclerosis 01/08/2009  . HYPOTHYROIDISM, HX OF 01/08/2009  PLAN -Continue follow-up with primary care physician for management of other chronic medical co-morbidities.  Continue Treatment as per schedule q2 weeks Labs q2weeks RTC with Dr [name] in 4 weeks with labs C5D1  All of the patients questions were answered with apparent satisfaction. The patient knows to call the clinic with any problems, questions or concerns.  I spent 20 minutes counseling the patient face to  face. The total time spent in the appointment was 25 minutes and more than 50% was on counseling and direct patient cares.    Sullivan Lone MD [place] AAHIVMS [facility] Hematology/Oncology Physician [facility]  (Office):       [phone] (Work cell):  [phone] (Fax):           [phone]

## 2017-04-20 NOTE — Patient Instructions (Signed)
[place] Discharge Instructions for Patients Receiving Chemotherapy  Today you received the following chemotherapy agents: Adriamycin, Vinblastine, Dacarbazine   To help prevent nausea and vomiting after your treatment, we encourage you to take your nausea medication as directed.    If you develop nausea and vomiting that is not controlled by your nausea medication, call the clinic.   BELOW ARE SYMPTOMS THAT SHOULD BE REPORTED IMMEDIATELY:  *FEVER GREATER THAN 100.5 F  *CHILLS WITH OR WITHOUT FEVER  NAUSEA AND VOMITING THAT IS NOT CONTROLLED WITH YOUR NAUSEA MEDICATION  *UNUSUAL SHORTNESS OF BREATH  *UNUSUAL BRUISING OR BLEEDING  TENDERNESS IN MOUTH AND THROAT WITH OR WITHOUT PRESENCE OF ULCERS  *URINARY PROBLEMS  *BOWEL PROBLEMS  UNUSUAL RASH Items with * indicate a potential emergency and should be followed up as soon as possible.  Feel free to call the clinic you have any questions or concerns. The clinic phone number is [phone].  Please show the [facility] at check-in to the Emergency Department and triage nurse.

## 2017-04-21 ENCOUNTER — Other Ambulatory Visit: Payer: Self-pay | Admitting: *Deleted

## 2017-04-21 DIAGNOSIS — C8111 Nodular sclerosis classical Hodgkin lymphoma, lymph nodes of head, face, and neck: Secondary | ICD-10-CM

## 2017-04-21 LAB — SEDIMENTATION RATE: Sedimentation Rate-Westergren: 31 mm/hr (ref 0–40)

## 2017-04-21 MED ORDER — DEXAMETHASONE 4 MG PO TABS: ORAL_TABLET | ORAL | 1 refills | Status: DC

## 2017-04-22 ENCOUNTER — Telehealth: Payer: Self-pay

## 2017-04-22 NOTE — Telephone Encounter (Signed)
Pt called to report she fell last night about 1145 and hit her head. It was bleeding then stopped. It started bleeding again but pt kept thinking it was going to stop but it did not.  She went to [facility] ED at 0700. They put 3 staples in her scalp. She reports they did not do any labs. She went home and slept. She feels OK now, denied need for repeat lab work. Instructed her to call if feeling extra weak or tired or SOB or heart pounding. She agreed with this.  Chemo 5/1 adria, vinblastin, DTIC

## 2017-04-26 ENCOUNTER — Telehealth: Payer: Self-pay | Admitting: *Deleted

## 2017-04-26 ENCOUNTER — Encounter (HOSPITAL_COMMUNITY): Payer: Self-pay | Admitting: *Deleted

## 2017-04-26 ENCOUNTER — Observation Stay (HOSPITAL_COMMUNITY)
Admission: EM | Admit: 2017-04-26 | Discharge: 2017-04-27 | Disposition: A | Discharge status: Home / Self Care | Payer: Medicare Other | Attending: Internal Medicine | Admitting: Internal Medicine

## 2017-04-26 ENCOUNTER — Other Ambulatory Visit: Payer: Self-pay | Admitting: *Deleted

## 2017-04-26 ENCOUNTER — Other Ambulatory Visit: Payer: Self-pay

## 2017-04-26 ENCOUNTER — Ambulatory Visit (HOSPITAL_BASED_OUTPATIENT_CLINIC_OR_DEPARTMENT_OTHER): Payer: Medicare Other

## 2017-04-26 DIAGNOSIS — C8190 Hodgkin lymphoma, unspecified, unspecified site: Secondary | ICD-10-CM | POA: Diagnosis not present

## 2017-04-26 DIAGNOSIS — I2510 Atherosclerotic heart disease of native coronary artery without angina pectoris: Secondary | ICD-10-CM | POA: Diagnosis not present

## 2017-04-26 DIAGNOSIS — M1990 Unspecified osteoarthritis, unspecified site: Secondary | ICD-10-CM | POA: Insufficient documentation

## 2017-04-26 DIAGNOSIS — Z96653 Presence of artificial knee joint, bilateral: Secondary | ICD-10-CM | POA: Diagnosis not present

## 2017-04-26 DIAGNOSIS — Z90710 Acquired absence of both cervix and uterus: Secondary | ICD-10-CM | POA: Insufficient documentation

## 2017-04-26 DIAGNOSIS — D649 Anemia, unspecified: Principal | ICD-10-CM | POA: Diagnosis present

## 2017-04-26 DIAGNOSIS — Z888 Allergy status to other drugs, medicaments and biological substances status: Secondary | ICD-10-CM | POA: Diagnosis not present

## 2017-04-26 DIAGNOSIS — S0101XA Laceration without foreign body of scalp, initial encounter: Secondary | ICD-10-CM | POA: Insufficient documentation

## 2017-04-26 DIAGNOSIS — W06XXXA Fall from bed, initial encounter: Secondary | ICD-10-CM | POA: Diagnosis not present

## 2017-04-26 DIAGNOSIS — I110 Hypertensive heart disease with heart failure: Secondary | ICD-10-CM | POA: Diagnosis not present

## 2017-04-26 DIAGNOSIS — Z8379 Family history of other diseases of the digestive system: Secondary | ICD-10-CM | POA: Diagnosis not present

## 2017-04-26 DIAGNOSIS — D72829 Elevated white blood cell count, unspecified: Secondary | ICD-10-CM | POA: Diagnosis not present

## 2017-04-26 DIAGNOSIS — Y939 Activity, unspecified: Secondary | ICD-10-CM | POA: Insufficient documentation

## 2017-04-26 DIAGNOSIS — Z79899 Other long term (current) drug therapy: Secondary | ICD-10-CM | POA: Diagnosis not present

## 2017-04-26 DIAGNOSIS — W19XXXA Unspecified fall, initial encounter: Secondary | ICD-10-CM

## 2017-04-26 DIAGNOSIS — I700 Atherosclerosis of aorta: Secondary | ICD-10-CM | POA: Diagnosis not present

## 2017-04-26 DIAGNOSIS — C8111 Nodular sclerosis classical Hodgkin lymphoma, lymph nodes of head, face, and neck: Secondary | ICD-10-CM | POA: Insufficient documentation

## 2017-04-26 DIAGNOSIS — Z7982 Long term (current) use of aspirin: Secondary | ICD-10-CM | POA: Diagnosis not present

## 2017-04-26 DIAGNOSIS — Z833 Family history of diabetes mellitus: Secondary | ICD-10-CM | POA: Insufficient documentation

## 2017-04-26 DIAGNOSIS — K589 Irritable bowel syndrome without diarrhea: Secondary | ICD-10-CM | POA: Insufficient documentation

## 2017-04-26 DIAGNOSIS — Z9221 Personal history of antineoplastic chemotherapy: Secondary | ICD-10-CM | POA: Insufficient documentation

## 2017-04-26 DIAGNOSIS — E7800 Pure hypercholesterolemia, unspecified: Secondary | ICD-10-CM | POA: Diagnosis not present

## 2017-04-26 DIAGNOSIS — Z87891 Personal history of nicotine dependence: Secondary | ICD-10-CM | POA: Insufficient documentation

## 2017-04-26 DIAGNOSIS — Z955 Presence of coronary angioplasty implant and graft: Secondary | ICD-10-CM | POA: Insufficient documentation

## 2017-04-26 DIAGNOSIS — E440 Moderate protein-calorie malnutrition: Secondary | ICD-10-CM | POA: Insufficient documentation

## 2017-04-26 DIAGNOSIS — Z8249 Family history of ischemic heart disease and other diseases of the circulatory system: Secondary | ICD-10-CM | POA: Diagnosis not present

## 2017-04-26 DIAGNOSIS — Z8673 Personal history of transient ischemic attack (TIA), and cerebral infarction without residual deficits: Secondary | ICD-10-CM | POA: Insufficient documentation

## 2017-04-26 DIAGNOSIS — E039 Hypothyroidism, unspecified: Secondary | ICD-10-CM | POA: Diagnosis present

## 2017-04-26 DIAGNOSIS — I10 Essential (primary) hypertension: Secondary | ICD-10-CM | POA: Diagnosis present

## 2017-04-26 DIAGNOSIS — Y92009 Unspecified place in unspecified non-institutional (private) residence as the place of occurrence of the external cause: Secondary | ICD-10-CM

## 2017-04-26 DIAGNOSIS — Z820 Family history of epilepsy and other diseases of the nervous system: Secondary | ICD-10-CM | POA: Insufficient documentation

## 2017-04-26 DIAGNOSIS — I6529 Occlusion and stenosis of unspecified carotid artery: Secondary | ICD-10-CM | POA: Insufficient documentation

## 2017-04-26 LAB — I-STAT CHEM 8, ED
BUN: 16 mg/dL (ref 6–20)
Calcium, Ion: 1.04 mmol/L — ABNORMAL LOW (ref 1.15–1.40)
Chloride: 103 mmol/L (ref 101–111)
Creatinine, Ser: 0.7 mg/dL (ref 0.44–1.00)
Glucose, Bld: 118 mg/dL — ABNORMAL HIGH (ref 65–99)
HCT: 17 % — ABNORMAL LOW (ref 36.0–46.0)
Hemoglobin: 5.8 g/dL — CL (ref 12.0–15.0)
Potassium: 3.4 mmol/L — ABNORMAL LOW (ref 3.5–5.1)
Sodium: 138 mmol/L (ref 135–145)
TCO2: 25 mmol/L (ref 0–100)

## 2017-04-26 LAB — IRON AND TIBC
Iron: 12 ug/dL — ABNORMAL LOW (ref 28–170)
Saturation Ratios: 6 % — ABNORMAL LOW (ref 10.4–31.8)
TIBC: 210 ug/dL — ABNORMAL LOW (ref 250–450)
UIBC: 198 ug/dL

## 2017-04-26 LAB — COMPREHENSIVE METABOLIC PANEL
ALT: 19 U/L (ref 0–55)
AST: 15 U/L (ref 5–34)
Albumin: 3.3 g/dL — ABNORMAL LOW (ref 3.5–5.0)
Alkaline Phosphatase: 165 U/L — ABNORMAL HIGH (ref 40–150)
Anion Gap: 7 mEq/L (ref 3–11)
BUN: 17 mg/dL (ref 7.0–26.0)
CO2: 28 mEq/L (ref 22–29)
Calcium: 8.4 mg/dL (ref 8.4–10.4)
Chloride: 106 mEq/L (ref 98–109)
Creatinine: 0.7 mg/dL (ref 0.6–1.1)
EGFR: 77 mL/min/{1.73_m2} — ABNORMAL LOW (ref >90)
Glucose: 168 mg/dl — ABNORMAL HIGH (ref 70–140)
Potassium: 3.5 mEq/L (ref 3.5–5.1)
Sodium: 140 mEq/L (ref 136–145)
Total Bilirubin: 0.26 mg/dL (ref 0.20–1.20)
Total Protein: 5.5 g/dL — ABNORMAL LOW (ref 6.4–8.3)

## 2017-04-26 LAB — CBC WITH DIFFERENTIAL/PLATELET
BASO%: 0.3 % (ref 0.0–2.0)
Basophils Absolute: 0 10*3/uL (ref 0.0–0.1)
EOS%: 2.7 % (ref 0.0–7.0)
Eosinophils Absolute: 0.3 10*3/uL (ref 0.0–0.5)
HCT: 20.8 % — ABNORMAL LOW (ref 34.8–46.6)
HGB: 6.6 g/dL — CL (ref 11.6–15.9)
LYMPH%: 5.1 % — ABNORMAL LOW (ref 14.0–49.7)
MCH: 28.3 pg (ref 25.1–34.0)
MCHC: 31.7 g/dL (ref 31.5–36.0)
MCV: 89.3 fL (ref 79.5–101.0)
MONO#: 0.2 10*3/uL (ref 0.1–0.9)
MONO%: 2.4 % (ref 0.0–14.0)
NEUT#: 8.2 10*3/uL — ABNORMAL HIGH (ref 1.5–6.5)
NEUT%: 89.5 % — ABNORMAL HIGH (ref 38.4–76.8)
Platelets: 264 10*3/uL (ref 145–400)
RBC: 2.33 10*6/uL — ABNORMAL LOW (ref 3.70–5.45)
RDW: 20.6 % — ABNORMAL HIGH (ref 11.2–14.5)
WBC: 9.2 10*3/uL (ref 3.9–10.3)
lymph#: 0.5 10*3/uL — ABNORMAL LOW (ref 0.9–3.3)

## 2017-04-26 LAB — RETICULOCYTES
RBC.: 2.16 MIL/uL — ABNORMAL LOW (ref 3.87–5.11)
Retic Ct Pct: <0.4 % — ABNORMAL LOW (ref 0.4–3.1)

## 2017-04-26 LAB — I-STAT TROPONIN, ED: Troponin i, poc: 0.12 ng/mL (ref 0.00–0.08)

## 2017-04-26 LAB — FERRITIN: Ferritin: 397 ng/mL — ABNORMAL HIGH (ref 11–307)

## 2017-04-26 LAB — PREPARE RBC (CROSSMATCH)

## 2017-04-26 LAB — FOLATE: Folate: 20.2 ng/mL (ref >5.9)

## 2017-04-26 LAB — VITAMIN B12: Vitamin B-12: 3385 pg/mL — ABNORMAL HIGH (ref 180–914)

## 2017-04-26 LAB — MAGNESIUM: Magnesium: 1.8 mg/dL (ref 1.7–2.4)

## 2017-04-26 MED ORDER — SODIUM CHLORIDE 0.9 % IV SOLN: 250.0000 mL | INTRAVENOUS | Status: DC | PRN

## 2017-04-26 MED ORDER — POTASSIUM CHLORIDE CRYS ER 20 MEQ PO TBCR
40.0000 meq | EXTENDED_RELEASE_TABLET | Freq: Once | ORAL | Status: AC
  Administered 2017-04-26: 40 meq via ORAL
  Filled 2017-04-26: qty 2

## 2017-04-26 MED ORDER — SODIUM CHLORIDE 0.9% FLUSH: 3.0000 mL | INTRAVENOUS | Status: DC | PRN

## 2017-04-26 MED ORDER — ACETAMINOPHEN 650 MG RE SUPP: 650.0000 mg | Freq: Four times a day (QID) | RECTAL | Status: DC | PRN

## 2017-04-26 MED ORDER — ACETAMINOPHEN 325 MG PO TABS: 650.0000 mg | ORAL_TABLET | Freq: Four times a day (QID) | ORAL | Status: DC | PRN

## 2017-04-26 MED ORDER — POLYSACCHARIDE IRON COMPLEX 150 MG PO CAPS
150.0000 mg | ORAL_CAPSULE | Freq: Every day | ORAL | Status: DC
  Administered 2017-04-27: 150 mg via ORAL
  Filled 2017-04-26: qty 1

## 2017-04-26 MED ORDER — SODIUM CHLORIDE 0.9% FLUSH: 3.0000 mL | Freq: Two times a day (BID) | INTRAVENOUS | Status: DC

## 2017-04-26 MED ORDER — TRAZODONE HCL 50 MG PO TABS: 25.0000 mg | ORAL_TABLET | Freq: Every evening | ORAL | Status: DC | PRN

## 2017-04-26 MED ORDER — ASPIRIN EC 81 MG PO TBEC
81.0000 mg | DELAYED_RELEASE_TABLET | Freq: Every day | ORAL | Status: DC
  Filled 2017-04-26: qty 1

## 2017-04-26 MED ORDER — LEVOTHYROXINE SODIUM 88 MCG PO TABS
88.0000 ug | ORAL_TABLET | Freq: Every day | ORAL | Status: DC
  Administered 2017-04-27: 88 ug via ORAL
  Filled 2017-04-26: qty 1

## 2017-04-26 MED ORDER — ONDANSETRON HCL 4 MG PO TABS: 4.0000 mg | ORAL_TABLET | Freq: Four times a day (QID) | ORAL | Status: DC | PRN

## 2017-04-26 MED ORDER — SODIUM CHLORIDE 0.9 % IV SOLN
10.0000 mL/h | Freq: Once | INTRAVENOUS | Status: AC
  Administered 2017-04-26: 10 mL/h via INTRAVENOUS

## 2017-04-26 MED ORDER — ONDANSETRON HCL 4 MG/2ML IJ SOLN: 4.0000 mg | Freq: Four times a day (QID) | INTRAMUSCULAR | Status: DC | PRN

## 2017-04-26 NOTE — Telephone Encounter (Signed)
Pt called this morning stating she had hit her head last week and lost "a lot of blood".  Pt feeling very week and requesting to come in for lab work.  Lab apt scheduled for 3pm.  HGB 6.6, per Dr. [name] pt should be transported to ED, should not wait until tomorrow for transfusion.  Transported pt via wheelchair to ED/room 23.  Notified ED RN that MD did not want to pt be discharged with critical hgb given her age/living independently.  Notified lab that pt in ED for transfusion, will sent hold tube to blood bank.

## 2017-04-26 NOTE — H&P (Addendum)
History and Physical    Kim Lane ZJQ:[id number] DOB: [birth year] DOA: 04/26/2017    PCP: Leeroy Cha, MD  Patient coming from: home  Chief Complaint: weak  HPI: Kim Lane is a 83 y.o. female with medical history of Hodgkins Lymphoma, HTN, hypothyroidism, CAD s/p stent to LAD who fell a few days ago off of her bed and sustained a laceration to her scalp which bled quite a lot. She eventually when to [facility] ED and had 3 staples placed. The subsequent day she developed weakness which has progressed. She presented to the Cancer center today to have blood work done and Hb was found to be 6.6. She was sent to the ER for a blood transfusion. She last received a round of chemo on 5/1. She has not had anemia with any of her chemo thus far.   ED Course:  BP 102/57 Hb 6.6- repeat Hb 5.8  Review of Systems:  Chest pain the day after she fell- she took 2 nitro spray's for it and it resolved b/l ankle edema starting last night after a family wedding- has never had pedal edema so far Constipation Chronic knee pain  All other systems reviewed and apart from HPI, are negative.  Past Medical History:  Diagnosis Date  . CAD (coronary artery disease) 02/2006   Taxus stent placed to LAD and Diagonal per Dr. Olevia Perches  . HTN (hypertension)   . Hypercholesterolemia   . Hypothyroidism   . IBS (irritable bowel syndrome)   . Osteoarthritis   . Stroke [facility]) 03/2016    Past Surgical History:  Procedure Laterality Date  . APPENDECTOMY    . IR GENERIC HISTORICAL  01/25/2017   IR US GUIDE VASC ACCESS RIGHT 01/25/2017 [name], MD [facility]  . IR GENERIC HISTORICAL  01/25/2017   IR FLUORO GUIDE PORT INSERTION RIGHT 01/25/2017 [name], MD [facility]  . MASS EXCISION Right 12/22/2016   Procedure: RIGHT NECK LYMPH NODE BIOPSY;  Surgeon: Rozetta Nunnery, MD;  Location: [facility];  Service: ENT;  Laterality: Right;  . stents     in heart  . TOTAL KNEE  ARTHROPLASTY Bilateral 2011,2012  . VAGINAL HYSTERECTOMY      Social History:   reports that she has quit smoking. She has never used smokeless tobacco. She reports that she does not drink alcohol or use drugs.  Lives alone and uses her cane  Allergies  Allergen Reactions  . Fosamax [Alendronate Sodium] Other (See Comments)    "made me ache all over"    Family History  Problem Relation Age of Onset  . Diabetes Mother   . Dementia Mother   . ALS Mother   . Heart Problems Father   . Liver disease Sister   . Cirrhosis Sister   . Heart block      CABG  . Diabetes Sister   . Heart block Son     CABG  . Other [place]  . Other Daughter     [place]  . Dementia Sister      Prior to Admission medications   Medication Sig Start Date End Date Taking? Authorizing Provider  aspirin 81 MG tablet Take 81 mg by mouth daily.     Yes [provider]  clotrimazole-betamethasone (LOTRISONE) cream Apply 1 application topically 2 (two) times daily. To area of skin rash on extremities and trunk. (not on face) 04/20/17 05/05/17 Yes Brunetta Genera, MD  co-enzyme  Q-10 30 MG capsule Take 30 mg by mouth daily.    Yes [provider]  dexamethasone (DECADRON) 4 MG tablet Take 2 tablets by mouth once a day on the day after chemotherapy and then take 2 tablets two times a day for 2 days. Take with food. 04/21/17  Yes Brunetta Genera, MD  iron polysaccharides (NIFEREX) 150 MG capsule Take 1 capsule (150 mg total) by mouth daily. 03/29/17  Yes Brunetta Genera, MD  levothyroxine (SYNTHROID, LEVOTHROID) 88 MCG tablet Take 88 mcg by mouth daily. 11/04/15  Yes [provider]  lidocaine-prilocaine (EMLA) cream Apply to affected area once 01/19/17  Yes Brunetta Genera, MD  metoprolol succinate (TOPROL-XL) 25 MG 24 hr tablet Take 12.5 mg by mouth every other day.    Yes [provider]  Multiple Vitamins-Minerals (CENTRUM SILVER PO) Take 1  tablet by mouth daily.     Yes [provider]  nitroGLYCERIN (NITROLINGUAL) 0.4 MG/SPRAY spray Place 1 spray under the tongue every 5 (five) minutes x 3 doses as needed for chest pain (3 sprays max).   Yes [provider]  Omega-3 Fatty Acids (FISH OIL) 1000 MG CAPS Take 1 capsule by mouth every other day.    Yes [provider]  ondansetron (ZOFRAN) 8 MG tablet Take 1 tablet (8 mg total) by mouth 2 (two) times daily as needed. Start on the third day after chemotherapy. 01/19/17  Yes Brunetta Genera, MD  RESTASIS 0.05 % ophthalmic emulsion Place 2 drops into both eyes 2 (two) times daily. For dry eyes 09/19/15  Yes [provider]  traZODone (DESYREL) 50 MG tablet Take 0.5-1 tablets (25-50 mg total) by mouth at bedtime as needed for sleep. 04/06/17  Yes Brunetta Genera, MD  dexamethasone (DECADRON) 4 MG tablet Take 1 tablet (4 mg total) by mouth daily. Patient not taking: Reported on 04/26/2017 01/18/17   Brunetta Genera, MD  [facility] HFA 108 [phone] Base) MCG/ACT inhaler  10/29/16   [provider]  prochlorperazine (COMPAZINE) 10 MG tablet Take 1 tablet (10 mg total) by mouth every 6 (six) hours as needed (Nausea or vomiting). 01/19/17   Brunetta Genera, MD  triamcinolone cream (KENALOG) 0.1 % Apply 1 application topically daily.  05/19/16   [provider]    Physical Exam: Wt Readings from Last 3 Encounters:  04/26/17 54.9 kg (121 lb)  04/20/17 54.2 kg (119 lb 7 oz)  04/06/17 54.1 kg (119 lb 3.2 oz)   Vitals:   04/26/17 1542 04/26/17 1550  BP: (!) 102/57   Pulse: 89   Resp: 16   Temp: 98.5 F (36.9 C)   TempSrc: Oral   SpO2: 98%   Weight:  54.9 kg (121 lb)  Height:  5\' 4"  (1.626 m)      Constitutional: NAD, calm, comfortable Eyes: PERTLA, lids and conjunctivae normal ENMT: Mucous membranes are moist. Posterior pharynx clear of any exudate or lesions. Normal dentition.  Neck: normal, supple, no masses, no  thyromegaly Respiratory: clear to auscultation bilaterally, no wheezing, no crackles. Normal respiratory effort. No accessory muscle use.  Cardiovascular: S1 & S2 heard, regular rate and rhythm, no murmurs / rubs / gallops. No extremity edema. 2+ pedal pulses. No carotid bruits.  Abdomen: No distension, no tenderness, no masses palpated. No hepatosplenomegaly. Bowel sounds normal.  Musculoskeletal: no clubbing / cyanosis. No joint deformity upper and lower extremities. Good ROM, no contractures. Normal muscle tone.  Skin: no ulcers. No induration- faint  maculopapular rash all over her body Neurologic: CN 2-12 grossly intact. Sensation intact, DTR normal. Strength 5/5 in all 4 limbs.  Psychiatric: Normal judgment and insight. Alert and oriented x 3. Normal mood.     Labs on Admission: I have personally reviewed following labs and imaging studies  CBC:  Recent Labs Lab 04/20/17 1038 04/26/17 1452 04/26/17 1630  WBC 26.4* 9.2  --   NEUTROABS 22.8* 8.2*  --   HGB 9.7* 6.6* 5.8*  HCT 30.4* 20.8* 17.0*  MCV 87.6 89.3  --   PLT 447* 264  --    Basic Metabolic Panel:  Recent Labs Lab 04/20/17 1039 04/26/17 1452 04/26/17 1630  NA 141 140 138  K 4.1 3.5 3.4*  CL  --   --  103  CO2 26 28  --   GLUCOSE 104 168* 118*  BUN 12.6 17.0 16  CREATININE 0.8 0.7 0.70  CALCIUM 9.0 8.4  --    GFR: Estimated Creatinine Clearance: 48.4 mL/min (by C-G formula based on SCr of 0.7 mg/dL). Liver Function Tests:  Recent Labs Lab 04/20/17 1039 04/26/17 1452  AST 18 15  ALT 16 19  ALKPHOS 143 165*  BILITOT 0.24 0.26  PROT 6.3* 5.5*  ALBUMIN 3.4* 3.3*   No results for input(s): LIPASE, AMYLASE in the last 168 hours. No results for input(s): AMMONIA in the last 168 hours. Coagulation Profile: No results for input(s): INR, PROTIME in the last 168 hours. Cardiac Enzymes: No results for input(s): CKTOTAL, CKMB, CKMBINDEX, TROPONINI in the last 168 hours. BNP (last 3 results) No results  for input(s): PROBNP in the last 8760 hours. HbA1C: No results for input(s): HGBA1C in the last 72 hours. CBG: No results for input(s): GLUCAP in the last 168 hours. Lipid Profile: No results for input(s): CHOL, HDL, LDLCALC, TRIG, CHOLHDL, LDLDIRECT in the last 72 hours. Thyroid Function Tests: No results for input(s): TSH, T4TOTAL, FREET4, T3FREE, THYROIDAB in the last 72 hours. Anemia Panel: No results for input(s): VITAMINB12, FOLATE, FERRITIN, TIBC, IRON, RETICCTPCT in the last 72 hours. Urine analysis:    Component Value Date/Time   COLORURINE YELLOW 12/23/2010 1045   APPEARANCEUR CLEAR 12/23/2010 1045   LABSPEC 1.007 12/23/2010 1045   PHURINE 7.0 12/23/2010 1045   GLUCOSEU NEGATIVE 12/23/2010 1045   HGBUR TRACE (A) 12/23/2010 1045   BILIRUBINUR NEGATIVE 12/23/2010 1045   KETONESUR NEGATIVE 12/23/2010 1045   PROTEINUR NEGATIVE 12/23/2010 1045   UROBILINOGEN 0.2 12/23/2010 1045   NITRITE NEGATIVE 12/23/2010 1045   LEUKOCYTESUR SMALL (A) 12/23/2010 1045   Sepsis Labs: @LABRCNTIP (procalcitonin:4,lacticidven:4) )No results found for this or any previous visit (from the past 240 hour(s)).   Radiological Exams on Admission: No results found.  EKG: NSR at 82 bpm with no ST or T wave changes  Assessment/Plan Principal Problem:   Symptomatic anemia - Hb 9.7 on 04/20/17 and now 6.6- repeated in ER and 5.8 - transfuse 2 u PRBC, check anemia panel - suspect it was due to acute blood loss as she has not had anemia as a result of previous rounds of chemo - check EKG due to chest pain a few days ago and h/o CAD- cont ASA   Active Problems:   Essential hypertension, benign   Coronary atherosclerosis - on Aspirin and Metoprolol for this  Hypotension - BP borderline low- takes Metoprolol for CAD QOD at 12.5 mg- took today- follow BP tomorrow- should not need Metoprolol tomorrow  Pedal edema - currently on left ankle edema- as it  occurred while at a wedding last night and she  has nor, suspect it may be venous stasis    Hypothyroidism - synthroid    Hodgkin lymphoma  - chemo per Dr [name]   DVT prophylaxis: SCDs  Code Status: Full code  Family Communication:   Disposition Plan: telemetry monitoring  Consults called:   Admission status: observation    Debbe Odea MD [facility] Pager: [email or web address] Password [email or web address] 7PM-7AM, please contact night-coverage   04/26/2017, 4:33 PM

## 2017-04-26 NOTE — ED Notes (Signed)
Hospitalist at bedside 

## 2017-04-26 NOTE — ED Provider Notes (Signed)
[place] DEPT Provider Note   CSN: [id number] Arrival date & time: 04/26/17  1537     History   Chief Complaint Chief Complaint  Patient presents with  . Hgb 6.6  . Dizziness    HPI Kim Lane is a 83 y.o. female.  HPI Pt with hx of hodgkins lymphoma, CAD comes in with cc of dizziness, weakness and low hemoglobin. Per pt, she had a fall on Thursday and had a scalp laceration which led to significant blood loss. Pt went to OSH where they repaired her laceration and sent her hoe. Since then she has been feeling weak and dizzy. Pt went to her cancer center for the symptoms, her hb was noted to be 6.6 - so she was asked to come to the ER. Pt denies being on blood thinners. She also denies any bloody stools or dark tarry stools or any bloody urine. Pt has required transfusion in the past. Pt denies nausea, emesis, fevers, chills, chest pains, shortness of breath, headaches, abdominal pain, uti like symptoms.   Past Medical History:  Diagnosis Date  . CAD (coronary artery disease) 02/2006   Taxus stent placed to LAD and Diagonal per Dr. Olevia Perches  . HTN (hypertension)   . Hypercholesterolemia   . Hypothyroidism   . IBS (irritable bowel syndrome)   . Osteoarthritis   . Stroke [facility]) 03/2016    Patient Active Problem List   Diagnosis Date Noted  . Symptomatic anemia 04/26/2017  . Hodgkin lymphoma ([place]) 04/26/2017  . Port catheter in place 02/02/2017  . 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 Dyspnea 09/14/2012  . Epistaxis 06/02/2011  . HYPERCHOLESTEROLEMIA, MIXED 01/08/2009  . Essential hypertension, benign 01/08/2009  . Coronary atherosclerosis 01/08/2009  . Hypothyroidism 01/08/2009    Past Surgical History:  Procedure Laterality Date  . APPENDECTOMY    . IR GENERIC HISTORICAL  01/25/2017   IR US GUIDE VASC ACCESS RIGHT 01/25/2017 [name], MD [facility]  . IR GENERIC HISTORICAL  01/25/2017   IR FLUORO GUIDE PORT  INSERTION RIGHT 01/25/2017 [name], MD [facility]  . MASS EXCISION Right 12/22/2016   Procedure: RIGHT NECK LYMPH NODE BIOPSY;  Surgeon: Rozetta Nunnery, MD;  Location: [place];  Service: ENT;  Laterality: Right;  . stents     in heart  . TOTAL KNEE ARTHROPLASTY Bilateral 2011,2012  . VAGINAL HYSTERECTOMY      OB History    No data available       Home Medications    Prior to Admission medications   Medication Sig Start Date End Date Taking? Authorizing Provider  aspirin 81 MG tablet Take 81 mg by mouth daily.     Yes [provider]  clotrimazole-betamethasone (LOTRISONE) cream Apply 1 application topically 2 (two) times daily. To area of skin rash on extremities and trunk. (not on face) 04/20/17 05/05/17 Yes Brunetta Genera, MD  co-enzyme Q-10 30 MG capsule Take 30 mg by mouth daily.    Yes [provider]  dexamethasone (DECADRON) 4 MG tablet Take 2 tablets by mouth once a day on the day after chemotherapy and then take 2 tablets two times a day for 2 days. Take with food. 04/21/17  Yes Brunetta Genera, MD  iron polysaccharides (NIFEREX) 150 MG capsule Take 1 capsule (150 mg total) by mouth daily. 03/29/17  Yes Brunetta Genera, MD  levothyroxine (SYNTHROID, LEVOTHROID) 88 MCG tablet Take 88 mcg by mouth  daily. 11/04/15  Yes [provider]  lidocaine-prilocaine (EMLA) cream Apply to affected area once 01/19/17  Yes Brunetta Genera, MD  metoprolol succinate (TOPROL-XL) 25 MG 24 hr tablet Take 12.5 mg by mouth every other day.    Yes [provider]  Multiple Vitamins-Minerals (CENTRUM SILVER PO) Take 1 tablet by mouth daily.     Yes [provider]  nitroGLYCERIN (NITROLINGUAL) 0.4 MG/SPRAY spray Place 1 spray under the tongue every 5 (five) minutes x 3 doses as needed for chest pain (3 sprays max).   Yes [provider]  Omega-3 Fatty Acids (FISH OIL) 1000 MG CAPS Take 1 capsule by mouth  every other day.    Yes [provider]  ondansetron (ZOFRAN) 8 MG tablet Take 1 tablet (8 mg total) by mouth 2 (two) times daily as needed. Start on the third day after chemotherapy. 01/19/17  Yes Brunetta Genera, MD  RESTASIS 0.05 % ophthalmic emulsion Place 2 drops into both eyes 2 (two) times daily. For dry eyes 09/19/15  Yes [provider]  traZODone (DESYREL) 50 MG tablet Take 0.5-1 tablets (25-50 mg total) by mouth at bedtime as needed for sleep. 04/06/17  Yes Brunetta Genera, MD  dexamethasone (DECADRON) 4 MG tablet Take 1 tablet (4 mg total) by mouth daily. Patient not taking: Reported on 04/26/2017 01/18/17   Brunetta Genera, MD  [facility] HFA 108 [phone] Base) MCG/ACT inhaler  10/29/16   [provider]  prochlorperazine (COMPAZINE) 10 MG tablet Take 1 tablet (10 mg total) by mouth every 6 (six) hours as needed (Nausea or vomiting). 01/19/17   Brunetta Genera, MD  triamcinolone cream (KENALOG) 0.1 % Apply 1 application topically daily.  05/19/16   [provider]    Family History Family History  Problem Relation Age of Onset  . Diabetes Mother   . Dementia Mother   . ALS Mother   . Heart Problems Father   . Liver disease Sister   . Cirrhosis Sister   . Heart block      CABG  . Diabetes Sister   . Heart block Son     CABG  . Other [place]  . Other Daughter     [place]  . Dementia Sister     Social History Social History  Substance Use Topics  . Smoking status: Former Research scientist (life sciences)  . Smokeless tobacco: Never Used     Comment: quit smoking 30 years ago, very light smoker  . Alcohol use No     Allergies   Fosamax [alendronate sodium]   Review of Systems Review of Systems  Constitutional: Positive for activity change.  Respiratory: Positive for shortness of breath.   Cardiovascular: Negative for chest pain.  Neurological: Positive for weakness.  All other systems reviewed and are  negative.    Physical Exam Updated Vital Signs BP (!) 102/57   Pulse 89   Temp 98.5 F (36.9 C) (Oral)   Resp 16   Ht 5\' 4"  (1.626 m)   Wt 121 lb (54.9 kg)   SpO2 98%   BMI 20.77 kg/m   Physical Exam  Constitutional: She is oriented to person, place, and time. She appears well-developed.  HENT:  Head: Normocephalic and atraumatic.  Eyes: EOM are normal.  Neck: Normal range of motion. Neck supple.  Cardiovascular: Normal rate.   Pulmonary/Chest: Effort normal.  Abdominal: Bowel sounds are normal.  Neurological: She is alert and oriented to  person, place, and time.  Skin: Skin is warm and dry.  Scalp has a dressing on  Nursing note and vitals reviewed.    ED Treatments / Results  Labs (all labs ordered are listed, but only abnormal results are displayed) Labs Reviewed  RETICULOCYTES - Abnormal; Notable for the following:       Result Value   Retic Ct Pct <0.4 (*)    RBC. 2.16 (*)    All other components within normal limits  I-STAT CHEM 8, ED - Abnormal; Notable for the following:    Potassium 3.4 (*)    Glucose, Bld 118 (*)    Calcium, Ion 1.04 (*)    Hemoglobin 5.8 (*)    HCT 17.0 (*)    All other components within normal limits  I-STAT TROPOININ, ED - Abnormal; Notable for the following:    Troponin i, poc 0.12 (*)    All other components within normal limits  VITAMIN B12  FOLATE  IRON AND TIBC  FERRITIN  HAPTOGLOBIN  CBC  TYPE AND SCREEN  PREPARE RBC (CROSSMATCH)    EKG  EKG Interpretation None      Date: 04/26/2017  Rate: 82  Rhythm: normal sinus rhythm  QRS Axis: normal  Intervals: normal  ST/T Wave abnormalities: normal  Conduction Disutrbances: none  Narrative Interpretation: unremarkable      Radiology No results found.  Procedures Procedures (including critical care time)  CRITICAL CARE Performed by: Varney Biles   Total critical care time: 43 minutes  Critical care time was exclusive of separately billable  procedures and treating other patients.  Critical care was necessary to treat or prevent imminent or life-threatening deterioration.  Critical care was time spent personally by me on the following activities: development of treatment plan with patient and/or surrogate as well as nursing, discussions with consultants, evaluation of patient's response to treatment, examination of patient, obtaining history from patient or surrogate, ordering and performing treatments and interventions, ordering and review of laboratory studies, ordering and review of radiographic studies, pulse oximetry and re-evaluation of patient's condition.   Medications Ordered in ED Medications  levothyroxine (SYNTHROID, LEVOTHROID) tablet 88 mcg (not administered)  iron polysaccharides (NIFEREX) capsule 150 mg (not administered)  aspirin tablet 81 mg (not administered)  traZODone (DESYREL) tablet 25-50 mg (not administered)  sodium chloride flush (NS) 0.9 % injection 3 mL (not administered)  sodium chloride flush (NS) 0.9 % injection 3 mL (not administered)  0.9 %  sodium chloride infusion (not administered)  acetaminophen (TYLENOL) tablet 650 mg (not administered)    Or  acetaminophen (TYLENOL) suppository 650 mg (not administered)  ondansetron (ZOFRAN) tablet 4 mg (not administered)    Or  ondansetron (ZOFRAN) injection 4 mg (not administered)  0.9 %  sodium chloride infusion (10 mL/hr Intravenous New Bag/Given 04/26/17 1655)     Initial Impression / Assessment and Plan / ED Course  I have reviewed the triage vital signs and the nursing notes.  Pertinent labs & imaging results that were available during my care of the patient were reviewed by me and considered in my medical decision making (see chart for details).    Pt comes in with cc of weakness, dizziness. Hb is 6.6 at OSH. We will admit for transfusion. Seems like acute blood loss anemia, but it could be chemo related anemia as well. Pt consented for  transfusion.   Final Clinical Impressions(s) / ED Diagnoses   Final diagnoses:  Symptomatic anemia    New Prescriptions New Prescriptions  No medications on file     Varney Biles, MD 04/26/17 1728

## 2017-04-26 NOTE — Telephone Encounter (Signed)
"  I feel so bad.  Could I come in for lab today.  I do not think I need to wait until next week.  Chemotherapy on 04-20-2017 and I fell last week and lost a lot of blood.  I'm weak.  I will get someone to bring me in at any time today.  Return number [phone]."

## 2017-04-26 NOTE — ED Notes (Signed)
Bed: CB44 Expected date:  Expected time:  Means of arrival:  Comments: Pt from [place]

## 2017-04-26 NOTE — ED Notes (Signed)
Pt reports she fell early Thursday morning off of her bed.  Hitting the back of her head.  She states that she had a lot of bleeding but it stopped, she went back to bed, once she did, her head started bleeding again and was not able to stop it.  She did not call the ambulance, waited until Friday to call her oncologist and made an appt for today.  Went to see her oncologist at the cancer ctr today, and was found to have a Hgb of 6.6 and was sent to the ED for blood transfusion.  Pt is A&Ox 4.  Denies any lightheadedness or h/a but reports weakness.  She also reports one episode of h/a over the weekend which resolved after taking OTC analgesic.

## 2017-04-27 DIAGNOSIS — C8111 Nodular sclerosis classical Hodgkin lymphoma, lymph nodes of head, face, and neck: Secondary | ICD-10-CM

## 2017-04-27 DIAGNOSIS — R21 Rash and other nonspecific skin eruption: Secondary | ICD-10-CM | POA: Diagnosis not present

## 2017-04-27 DIAGNOSIS — D649 Anemia, unspecified: Secondary | ICD-10-CM

## 2017-04-27 DIAGNOSIS — L299 Pruritus, unspecified: Secondary | ICD-10-CM | POA: Diagnosis not present

## 2017-04-27 DIAGNOSIS — D72828 Other elevated white blood cell count: Secondary | ICD-10-CM

## 2017-04-27 DIAGNOSIS — E440 Moderate protein-calorie malnutrition: Secondary | ICD-10-CM | POA: Insufficient documentation

## 2017-04-27 LAB — TYPE AND SCREEN
ABO/RH(D): O POS
Antibody Screen: NEGATIVE
Unit division: 0
Unit division: 0

## 2017-04-27 LAB — BPAM RBC
Blood Product Expiration Date: 201805282359
Blood Product Expiration Date: 201805282359
ISSUE DATE / TIME: 201805071825
ISSUE DATE / TIME: 201805072320
Unit Type and Rh: 5100
Unit Type and Rh: 5100

## 2017-04-27 LAB — HAPTOGLOBIN: Haptoglobin: 132 mg/dL (ref 34–200)

## 2017-04-27 MED ORDER — SODIUM CHLORIDE 0.9% FLUSH
10.0000 mL | INTRAVENOUS | Status: DC | PRN
  Administered 2017-04-27: 10 mL
  Filled 2017-04-27: qty 40

## 2017-04-27 MED ORDER — BOOST PLUS PO LIQD: 237.0000 mL | Freq: Three times a day (TID) | ORAL | 0 refills | Status: DC

## 2017-04-27 MED ORDER — HEPARIN SOD (PORK) LOCK FLUSH 100 UNIT/ML IV SOLN
500.0000 [IU] | INTRAVENOUS | Status: AC | PRN
  Administered 2017-04-27: 500 [IU]

## 2017-04-27 MED ORDER — ADULT MULTIVITAMIN W/MINERALS CH: 1.0000 | ORAL_TABLET | Freq: Every day | ORAL | Status: DC

## 2017-04-27 MED ORDER — BOOST PLUS PO LIQD
237.0000 mL | Freq: Three times a day (TID) | ORAL | Status: DC
  Filled 2017-04-27: qty 237

## 2017-04-27 NOTE — Progress Notes (Signed)
Pt had no preference for [facility] agency. [facility] was selected related to pt's insurance [facility]. Referral was given to in house rep.

## 2017-04-27 NOTE — Discharge Summary (Addendum)
Discharge Summary  Kim Lane MGQ:[id number] DOB: 02/19/1934  PCP: Leeroy Cha, MD  Admit date: 04/26/2017 Discharge date: 04/27/2017  Time spent: <33mins  Recommendations for Outpatient Follow-up:  1. F/u with oncology on 5/15for hospital discharge follow up, repeat cbc/bmp at follow up 2. f/u with cardiology Dr Johnsie Cancel   Discharge Diagnoses:  Active Hospital Problems   Diagnosis Date Noted  . Symptomatic anemia 04/26/2017  . Malnutrition of moderate degree 04/27/2017  . Hodgkin lymphoma ([place]) 04/26/2017  . Essential hypertension, benign 01/08/2009  . Coronary atherosclerosis 01/08/2009  . Hypothyroidism 01/08/2009    Resolved Hospital Problems   Diagnosis Date Noted Date Resolved  No resolved problems to display.    Discharge Condition: stable  Diet recommendation: heart healthy  Filed Weights   04/26/17 1550 04/26/17 2306  Weight: 54.9 kg (121 lb) 56.1 kg (123 lb 9.6 oz)    History of present illness:  PCP: Leeroy Cha, MD  Patient coming from: home  Chief Complaint: weak  HPI: Kim Lane is a 83 y.o. female with medical history of Hodgkins Lymphoma, HTN, hypothyroidism, CAD s/p stent to LAD who fell a few days ago off of her bed and sustained a laceration to her scalp which bled quite a lot. She eventually when to [facility] ED and had 3 staples placed. The subsequent day she developed weakness which has progressed. She presented to the Cancer center today to have blood work done and Hb was found to be 6.6. She was sent to the ER for a blood transfusion. She last received a round of chemo on 5/1. She has not had anemia with any of her chemo thus far.   ED Course:  BP 102/57 Hb 6.6- repeat Hb 5.8  Hospital Course:  Principal Problem:   Symptomatic anemia Active Problems:   Essential hypertension, benign   Coronary atherosclerosis   Hypothyroidism   Hodgkin lymphoma (HCC)   Malnutrition of moderate degree    Symptomatic  anemia/acute on chronic normocytic anemia - Hb 9.7 on 04/20/17 and now 6.6- repeated in ER and 5.8 - b12/folate wnl, retic inappropriately low, low iron but normal ferritin level - suspect it was due to acute blood loss in addition to anemia of chronic disease from lymphoma and chemo side effect -  hgb with appropriate increase after transfuse 2 u PRBC, she feels back to baseline, bp heart rate stable, no active bleed -hematology oncology Dr [name] input appreciated, patient is cleared to discharge home and follow up with Dr [name] closely  She chest pain a few days ago , h/o CAD- cont ASA, statin, betablocker at home dose ekg no acute changes, no chest pain in the hospital,  She is to follow up with cardiology outpatient.    Essential hypertension, benign   Coronary atherosclerosis - on Aspirin, toprolxl25mg  every other day at home which are continued.   Pedal edema, resolved, likely venous stasis after attending grandson's wedding on 5/6.      Hypothyroidism - continue synthroid    Hodgkin lymphoma , getting chemo , in remission - chemo per Dr [name]  malnutriton: moderate:  Findings are moderate fat depletion in arms and chest, moderate to severe muscle depletion over entire upper body, and no edema. Nutrition consult appreciated, on nutrition supplement.   DVT prophylaxis: SCDs  Code Status: Full code  Family Communication:  patient  Disposition Plan: home with home health Consults called:  hematology/oncology   Discharge Exam: BP 101/61 (BP Location: Left Arm)   Pulse 78  Temp 98.4 F (36.9 C) (Oral)   Resp 18   Ht 5\' 4"  (1.626 m)   Wt 56.1 kg (123 lb 9.6 oz)   SpO2 98%   BMI 21.22 kg/m   General: pale, alopecia, but NAD Cardiovascular: RRR Respiratory: CTABL Extremity: no edema  Discharge Instructions You were cared for by a hospitalist during your hospital stay. If you have any questions about your discharge medications or the care you received while you were  in the hospital after you are discharged, you can call the unit and asked to speak with the hospitalist on call if the hospitalist that took care of you is not available. Once you are discharged, your primary care physician will handle any further medical issues. Please note that NO REFILLS for any discharge medications will be authorized once you are discharged, as it is imperative that you return to your primary care physician (or establish a relationship with a primary care physician if you do not have one) for your aftercare needs so that they can reassess your need for medications and monitor your lab values.  Discharge Instructions    Diet - low sodium heart healthy    Complete by:  As directed    Face-to-face encounter (required for Medicare/Medicaid patients)    Complete by:  As directed    I Tarhonda Hollenberg certify that this patient is under my care and that I, or a nurse practitioner or physician's assistant working with me, had a face-to-face encounter that meets the physician face-to-face encounter requirements with this patient on 04/27/2017. The encounter with the patient was in whole, or in part for the following medical condition(s) which is the primary reason for home health care (List medical condition): FTT   The encounter with the patient was in whole, or in part, for the following medical condition, which is the primary reason for home health care:  FTT   I certify that, based on my findings, the following services are medically necessary home health services:  Physical therapy   Reason for Medically Necessary Home Health Services:  Skilled Nursing- Change/Decline in Patient Status   My clinical findings support the need for the above services:  Shortness of breath with activity   Further, I certify that my clinical findings support that this patient is homebound due to:  Immunocompromised   Home Health    Complete by:  As directed    To provide the following care/treatments:  PT    Increase activity slowly    Complete by:  As directed      Allergies as of 04/27/2017      Reactions   Fosamax [alendronate Sodium] Other (See Comments)   "made me ache all over"      Medication List    TAKE these medications   aspirin 81 MG tablet Take 81 mg by mouth daily.   CENTRUM SILVER PO Take 1 tablet by mouth daily.   clotrimazole-betamethasone cream Commonly known as:  LOTRISONE Apply 1 application topically 2 (two) times daily. To area of skin rash on extremities and trunk. (not on face)   co-enzyme Q-10 30 MG capsule Take 30 mg by mouth daily.   dexamethasone 4 MG tablet Commonly known as:  DECADRON Take 1 tablet (4 mg total) by mouth daily.   dexamethasone 4 MG tablet Commonly known as:  DECADRON Take 2 tablets by mouth once a day on the day after chemotherapy and then take 2 tablets two times a day for 2 days. Take  with food.   Fish Oil 1000 MG Caps Take 1 capsule by mouth every other day.   iron polysaccharides 150 MG capsule Commonly known as:  NIFEREX Take 1 capsule (150 mg total) by mouth daily.   lactose free nutrition Liqd Take 237 mLs by mouth 3 (three) times daily with meals.   levothyroxine 88 MCG tablet Commonly known as:  SYNTHROID, LEVOTHROID Take 88 mcg by mouth daily.   lidocaine-prilocaine cream Commonly known as:  EMLA Apply to affected area once   metoprolol succinate 25 MG 24 hr tablet Commonly known as:  TOPROL-XL Take 12.5 mg by mouth every other day.   nitroGLYCERIN 0.4 MG/SPRAY spray Commonly known as:  NITROLINGUAL Place 1 spray under the tongue every 5 (five) minutes x 3 doses as needed for chest pain (3 sprays max).   ondansetron 8 MG tablet Commonly known as:  ZOFRAN Take 1 tablet (8 mg total) by mouth 2 (two) times daily as needed. Start on the third day after chemotherapy.   PROAIR HFA 108 (90 Base) MCG/ACT inhaler Generic drug:  albuterol   prochlorperazine 10 MG tablet Commonly known as:  COMPAZINE Take 1  tablet (10 mg total) by mouth every 6 (six) hours as needed (Nausea or vomiting).   RESTASIS 0.05 % ophthalmic emulsion Generic drug:  cycloSPORINE Place 2 drops into both eyes 2 (two) times daily. For dry eyes   traZODone 50 MG tablet Commonly known as:  DESYREL Take 0.5-1 tablets (25-50 mg total) by mouth at bedtime as needed for sleep.   triamcinolone cream 0.1 % Commonly known as:  KENALOG Apply 1 application topically daily.      Allergies  Allergen Reactions  . Fosamax [Alendronate Sodium] Other (See Comments)    "made me ache all over"   Follow-up Information    Brunetta Genera, MD Follow up in 1 week(s).   Specialties:  Hematology, Oncology Contact information: [place] [address] [phone]        Josue Hector, MD Follow up in 3 week(s).   Specialty:  Cardiology Why:  cad Contact information: [address] [place] [address] [phone]        [place], [place] Follow up.   Specialty:  Home Health Services Why:   Nanine Means Will follow up with you at home for Home Health Physical Therapy. Contact information: [place] [address] [phone]            The results of significant diagnostics from this hospitalization (including imaging, microbiology, ancillary and laboratory) are listed below for reference.    Significant Diagnostic Studies: Nm Pet Image Restag (ps) Skull Base To Thigh  Result Date: 04/19/2017 CLINICAL DATA:  Subsequent treatment strategy for restaging of lymphoma. Status post 3 cycles of chemotherapy. EXAM: NUCLEAR MEDICINE PET SKULL BASE TO THIGH TECHNIQUE: 5 point a MCi F-18 FDG was injected intravenously. Full-ring PET imaging was performed from the skull base to thigh after the radiotracer. CT data was obtained and used for attenuation correction and anatomic localization. FASTING BLOOD GLUCOSE:  Value: 117 mg/dl COMPARISON:   01/14/2017 FINDINGS: NECK Interval resolution of low cervical hypermetabolic adenopathy. Index right supraclavicular nodes measure maximally 5 mm and a S.U.V. max of 1.0 today versus 1.3 cm and a S.U.V. max of 7.2 on the prior exam. Carotid atherosclerosis. CHEST Node at the right-sided thoracic inlet Measures 1.4 cm and is no longer hypermetabolic. Compare 2.0 cm and a  S.U.V. max of 14.2 on the prior exam. Right paratracheal node measures 2.0 cm and a S.U.V. max of 2.7 versus 2.1 cm and a S.U.V. max of 19.9. Borderline cardiomegaly. Multivessel coronary artery atherosclerosis. Tiny hiatal hernia. 4 mm right lower lobe pulmonary nodule is unchanged on image 48/series 7. Previously described right-sided reticulonodular opacification has resolved. ABDOMEN/PELVIS Index left periaortic node measures 12 mm and a S.U.V. max of 1.7 on image 105/series 4. Compare 2.3 cm and a S.U.V. max of 5.4 on the prior exam. No pelvic adenopathy. Abdominal aortic atherosclerosis. Colonic stool burden suggests constipation. Hysterectomy. SKELETON Diffuse marrow hypermetabolism is mild and most likely related to marrow stimulation by chemotherapy. No focal osseous lesion. IMPRESSION: 1. Marked response to therapy. Residual thoracoabdominal adenopathy, without malignant range hypermetabolism. 2.  Coronary artery atherosclerosis. Aortic atherosclerosis. 3.  Possible constipation. Electronically Signed   By: Abigail Miyamoto M.D.   On: 04/19/2017 10:32    Microbiology: No results found for this or any previous visit (from the past 240 hour(s)).   Labs: Basic Metabolic Panel:  Recent Labs Lab 04/26/17 1452 04/26/17 1621 04/26/17 1630  NA 140  --  138  K 3.5  --  3.4*  CL  --   --  103  CO2 28  --   --   GLUCOSE 168*  --  118*  BUN 17.0  --  16  CREATININE 0.7  --  0.70  CALCIUM 8.4  --   --   MG  --  1.8  --    Liver Function Tests:  Recent Labs Lab 04/26/17 1452  AST 15  ALT 19  ALKPHOS 165*  BILITOT 0.26  PROT  5.5*  ALBUMIN 3.3*   No results for input(s): LIPASE, AMYLASE in the last 168 hours. No results for input(s): AMMONIA in the last 168 hours. CBC:  Recent Labs Lab 04/26/17 1452 04/26/17 1630 04/27/17 0508  WBC 9.2  --  6.3  NEUTROABS 8.2*  --   --   HGB 6.6* 5.8* 9.1*  HCT 20.8* 17.0* 27.3*  MCV 89.3  --  88.6  PLT 264  --  258   Cardiac Enzymes: No results for input(s): CKTOTAL, CKMB, CKMBINDEX, TROPONINI in the last 168 hours. BNP: BNP (last 3 results) No results for input(s): BNP in the last 8760 hours.  ProBNP (last 3 results) No results for input(s): PROBNP in the last 8760 hours.  CBG: No results for input(s): GLUCAP in the last 168 hours.     SignedFlorencia Reasons MD, PhD  [facility] 04/27/2017, 1:07 PM

## 2017-04-27 NOTE — Evaluation (Signed)
Physical Therapy Evaluation-1x Patient Details Name: Kim Lane MRN: [id number] DOB: 14-May-1934 Today's Date: 04/27/2017   History of Present Illness  83 yo female admitted with symptomatic anemia, fall, scalp laceration. Hx of Hodgkin's lymphoma, CAD, CVA, OA  Clinical Impression  On eval, pt required min guard-min assist for mobility. She walked ~150 feet with a straight cane. Unsteady at times. Recommend HHPT follow (possibly 1-2 visits) to ensure safe transition back into home. Pt plans to d/c home later today. 1x eval. Will sign off. Recommend daily ambulation with nursing supervision during hospital stay.     Follow Up Recommendations Home health PT (1-2 visits)    Equipment Recommendations  None recommended by PT    Recommendations for Other Services       Precautions / Restrictions Precautions Precautions: Fall Restrictions Weight Bearing Restrictions: No      Mobility  Bed Mobility Overal bed mobility: Modified Independent                Transfers Overall transfer level: Modified independent                  Ambulation/Gait Ambulation/Gait assistance: Min guard;Min assist Ambulation Distance (Feet): 150 Feet Assistive device: Straight cane Gait Pattern/deviations: Step-through pattern;Decreased stride length;Drifts right/left;Staggering left;Staggering right     General Gait Details: Intermittent assist to stabilize. Pt tolerated distance well. She denied dizziness/lightheadedness.   Stairs            Wheelchair Mobility    Modified Rankin (Stroke Patients Only)       Balance                                             Pertinent Vitals/Pain Pain Assessment: No/denies pain    Home Living Family/patient expects to be discharged to:: Private residence Living Arrangements: Alone   Type of Home: House Home Access: Stairs to enter Entrance Stairs-Rails: Right Entrance Stairs-Number of Steps: 3 Home Layout:  One level Home Equipment: Environmental consultant - 2 wheels;Cane - single point      Prior Function Level of Independence: Independent with assistive device(s)         Comments: cane in community.      Hand Dominance        Extremity/Trunk Assessment   Upper Extremity Assessment Upper Extremity Assessment: Overall WFL for tasks assessed    Lower Extremity Assessment Lower Extremity Assessment: Generalized weakness    Cervical / Trunk Assessment Cervical / Trunk Assessment: Normal  Communication   Communication: No difficulties  Cognition Arousal/Alertness: Awake/alert Behavior During Therapy: WFL for tasks assessed/performed Overall Cognitive Status: Within Functional Limits for tasks assessed                                        General Comments      Exercises     Assessment/Plan    PT Assessment Patient needs continued PT services  PT Problem List Decreased mobility;Decreased strength;Decreased balance       PT Treatment Interventions DME instruction;Gait training;Therapeutic activities;Therapeutic exercise;Patient/family education;Balance training;Functional mobility training    PT Goals (Current goals can be found in the Care Plan section)  Acute Rehab PT Goals Patient Stated Goal: regain PLOF. home.  PT Goal Formulation: With patient Time For Goal Achievement: 05/11/17 Potential to  Achieve Goals: Good    Frequency Min 3X/week   Barriers to discharge        Co-evaluation               AM-PAC PT "6 Clicks" Daily Activity  Outcome Measure Difficulty turning over in bed (including adjusting bedclothes, sheets and blankets)?: None Difficulty moving from lying on back to sitting on the side of the bed? : None Difficulty sitting down on and standing up from a chair with arms (e.g., wheelchair, bedside commode, etc,.)?: None Help needed moving to and from a bed to chair (including a wheelchair)?: A Little Help needed walking in hospital  room?: A Little Help needed climbing 3-5 steps with a railing? : A Little 6 Click Score: 21    End of Session Equipment Utilized During Treatment: Gait belt Activity Tolerance: Patient tolerated treatment well Patient left: in chair;with call bell/phone within reach   PT Visit Diagnosis: Muscle weakness (generalized) (M62.81);Difficulty in walking, not elsewhere classified (R26.2)    Time: 3338-3291 PT Time Calculation (min) (ACUTE ONLY): 13 min   Charges:   PT Evaluation $PT Eval Low Complexity: 1 Procedure     PT G Codes:   PT G-Codes **NOT FOR INPATIENT CLASS** Functional Assessment Tool Used: AM-PAC 6 Clicks Basic Mobility;Clinical judgement Functional Limitation: Mobility: Walking and moving around Mobility: Walking and Moving Around Current Status (B1660): At least 1 percent but less than 20 percent impaired, limited or restricted Mobility: Walking and Moving Around Goal Status [phone]): At least 1 percent but less than 20 percent impaired, limited or restricted Mobility: Walking and Moving Around Discharge Status [phone]): At least 1 percent but less than 20 percent impaired, limited or restricted      Weston Anna, MPT Pager: [phone]

## 2017-04-27 NOTE — Progress Notes (Signed)
[name]   HEMATOLOGY/ONCOLOGY INPATIENT PROGRESS NOTE  Date of Service: 04/27/2017  Inpatient Attending: .Florencia Reasons, MD   SUBJECTIVE  [name] is a wonderful 83 year old female with a history of Hodgkin's lymphoma, hypertension, hypothyroidism, coronary disease status post PCI to LAD was admitted with symptomatically anemia with a hemoglobin of 6.6. She had followed off her bed while waking up at night and had a scalp laceration leading to significant blood loss. She went to the emergency room and had the wound taken care of. No labs were done. Patient enjoyed her grandson's wedding and called [place] yesterday with severe fatigue and inability to function at home. She came to the cancer clinic for labs and was noted to have a hemoglobin of 6.6 with severe symptomatic anemia likely from significant blood loss from her scalp wound. She was sent to the emergency room and was admitted to [facility] uneventfully. Hemoglobin this morning is 9.1 and she has no other acute concerns and feels ready to go home. I discussed in detail importance of fall precautions and using a cane at all times. Patient suggested that this was a mechanical fall and that she was not dizzy or lightheaded.  OBJECTIVE:  No acute distress  PHYSICAL EXAMINATION: . Vitals:   04/26/17 2313 04/26/17 2348 04/27/17 0226 04/27/17 0549  BP: (!) 109/58 (!) 103/59 (!) 126/59 101/61  Pulse: 85 83 75 78  Resp: 20 18 20 18   Temp: 99.2 F (37.3 C) 98.8 F (37.1 C) 98.3 F (36.8 C) 98.4 F (36.9 C)  TempSrc: Oral Oral Oral Oral  SpO2: 98% 100% 99% 98%  Weight:      Height:       Filed Weights   04/26/17 1550 04/26/17 2306  Weight: 121 lb (54.9 kg) 123 lb 9.6 oz (56.1 kg)   .Body mass index is 21.22 kg/m.  GENERAL:alert, in no acute distress and comfortable. No additional bleeding from her head wound. SKIN: Skin rash is resolving EYES: normal, conjunctiva are pink and non-injected, sclera clear OROPHARYNX: Mucous membranes  moist NECK: supple, no JVD, thyroid normal size, non-tender, without nodularity LYMPH:  no palpable lymphadenopathy in the cervical, axillary or inguinal LUNGS: clear to auscultation with normal respiratory effort HEART: regular rate & rhythm,  no murmurs and no lower extremity edema ABDOMEN: abdomen soft, non-tender, normoactive bowel sounds  Musculoskeletal: no cyanosis of digits and no clubbing  PSYCH: alert & oriented x 3 with fluent speech NEURO: no focal motor/sensory deficits  MEDICAL HISTORY:  Past Medical History:  Diagnosis Date  . CAD (coronary artery disease) 02/2006   Taxus stent placed to LAD and Diagonal per Dr. Olevia Perches  . HTN (hypertension)   . Hypercholesterolemia   . Hypothyroidism   . IBS (irritable bowel syndrome)   . Osteoarthritis   . Stroke [facility]) 03/2016    SURGICAL HISTORY: Past Surgical History:  Procedure Laterality Date  . APPENDECTOMY    . IR GENERIC HISTORICAL  01/25/2017   IR US GUIDE VASC ACCESS RIGHT 01/25/2017 [name], MD [facility]INTER[facility]  . IR GENERIC HISTORICAL  01/25/2017   IR FLUORO GUIDE PORT INSERTION RIGHT 01/25/2017 [name], MD [facility]  . MASS EXCISION Right 12/22/2016   Procedure: RIGHT NECK LYMPH NODE BIOPSY;  Surgeon: Rozetta Nunnery, MD;  Location: [place];  Service: ENT;  Laterality: Right;  . stents     in heart  . TOTAL KNEE ARTHROPLASTY Bilateral 2011,2012  . VAGINAL HYSTERECTOMY      SOCIAL HISTORY: Social  History   Social History  . Marital status: Widowed    Spouse name: N/A  . Number of children: 2  . Years of education: 12   Occupational History  .  [employer]   Social History Main Topics  . Smoking status: Former Research scientist (life sciences)  . Smokeless tobacco: Never Used     Comment: quit smoking 30 years ago, very light smoker  . Alcohol use No  . Drug use: No  . Sexual activity: Not on file   Other Topics Concern  . Not on file   Social History Narrative   Lives  alone   caffeine use- coffee- 5 cups daily    FAMILY HISTORY: Family History  Problem Relation Age of Onset  . Diabetes Mother   . Dementia Mother   . ALS Mother   . Heart Problems Father   . Liver disease Sister   . Cirrhosis Sister   . Heart block      CABG  . Diabetes Sister   . Heart block Son     CABG  . Other [place]  . Other Daughter     [place]  . Dementia Sister     ALLERGIES:  is allergic to fosamax [alendronate sodium].  MEDICATIONS:  Scheduled Meds: . aspirin EC  81 mg Oral Daily  . iron polysaccharides  150 mg Oral Daily  . levothyroxine  88 mcg Oral QAC breakfast  . sodium chloride flush  3 mL Intravenous Q12H   Continuous Infusions: . sodium chloride     PRN Meds:.sodium chloride, acetaminophen **OR** acetaminophen, ondansetron **OR** ondansetron (ZOFRAN) IV, sodium chloride flush, sodium chloride flush, traZODone  REVIEW OF SYSTEMS:    10 Point review of Systems was done is negative except as noted above.   LABORATORY DATA:  I have reviewed the data as listed  . CBC Latest Ref Rng & Units 04/27/2017 04/26/2017 04/26/2017  WBC 4.0 - 10.5 K/uL 6.3 9.2 -  Hemoglobin 12.0 - 15.0 g/dL 9.1(L) 5.8(LL) 6.6(LL)  Hematocrit 36.0 - 46.0 % 27.3(L) 17.0(L) 20.8(L)  Platelets 150 - 400 K/uL 258 264 -    . CMP Latest Ref Rng & Units 04/26/2017 04/26/2017 04/20/2017  Glucose 65 - 99 mg/dL 118(H) 168(H) 104  BUN 6 - 20 mg/dL 16 17.0 12.6  Creatinine 0.44 - 1.00 mg/dL 0.70 0.7 0.8  Sodium 135 - 145 mmol/L 138 140 141  Potassium 3.5 - 5.1 mmol/L 3.4(L) 3.5 4.1  Chloride 101 - 111 mmol/L 103 - -  CO2 22 - 29 mEq/L 28 - 26  Calcium 8.4 - 10.4 mg/dL 8.4 - 9.0  Total Protein 6.4 - 8.3 g/dL 5.5(L) - 6.3(L)  Total Bilirubin 0.20 - 1.20 mg/dL 0.26 - 0.24  Alkaline Phos 40 - 150 U/L 165(H) - 143  AST 5 - 34 U/L 15 - 18  ALT 0 - 55 U/L 19 - 16     RADIOGRAPHIC STUDIES: I have personally reviewed the radiological images as listed and agreed with  the findings in the report. Nm Pet Image Restag (ps) Skull Base To Thigh  Result Date: 04/19/2017 CLINICAL DATA:  Subsequent treatment strategy for restaging of lymphoma. Status post 3 cycles of chemotherapy. EXAM: NUCLEAR MEDICINE PET SKULL BASE TO THIGH TECHNIQUE: 5 point a MCi F-18 FDG was injected intravenously. Full-ring PET imaging was performed from the skull base to thigh after the radiotracer. CT data was obtained and used for attenuation correction  and anatomic localization. FASTING BLOOD GLUCOSE:  Value: 117 mg/dl COMPARISON:  01/14/2017 FINDINGS: NECK Interval resolution of low cervical hypermetabolic adenopathy. Index right supraclavicular nodes measure maximally 5 mm and a S.U.V. max of 1.0 today versus 1.3 cm and a S.U.V. max of 7.2 on the prior exam. Carotid atherosclerosis. CHEST Node at the right-sided thoracic inlet Measures 1.4 cm and is no longer hypermetabolic. Compare 2.0 cm and a S.U.V. max of 14.2 on the prior exam. Right paratracheal node measures 2.0 cm and a S.U.V. max of 2.7 versus 2.1 cm and a S.U.V. max of 19.9. Borderline cardiomegaly. Multivessel coronary artery atherosclerosis. Tiny hiatal hernia. 4 mm right lower lobe pulmonary nodule is unchanged on image 48/series 7. Previously described right-sided reticulonodular opacification has resolved. ABDOMEN/PELVIS Index left periaortic node measures 12 mm and a S.U.V. max of 1.7 on image 105/series 4. Compare 2.3 cm and a S.U.V. max of 5.4 on the prior exam. No pelvic adenopathy. Abdominal aortic atherosclerosis. Colonic stool burden suggests constipation. Hysterectomy. SKELETON Diffuse marrow hypermetabolism is mild and most likely related to marrow stimulation by chemotherapy. No focal osseous lesion. IMPRESSION: 1. Marked response to therapy. Residual thoracoabdominal adenopathy, without malignant range hypermetabolism. 2.  Coronary artery atherosclerosis. Aortic atherosclerosis. 3.  Possible constipation. Electronically Signed    By: Abigail Miyamoto M.D.   On: 04/19/2017 10:32    ASSESSMENT & PLAN:   83 year old Caucasian female with ECOG performance status of 2 with  1) Classical Hodgkin's lymphoma of nodular sclerosis variety . Atleast Stage IIIB some concern for possible Rt lung involvement which make in Stage IVB Presented with right neck lymph nodes . She had type B constitutional symptoms with 15-20 pound weight loss night sweats or chills .noted to have significant pruritus with mild rash likely from Hodgkin's     Wt Readings from Last 3 Encounters:  04/20/17 119 lb 7 oz (54.2 kg)  04/06/17 119 lb 3.2 oz (54.1 kg)  02/23/17 119 lb 12.8 oz (54.3 kg)  - her constitutional symptoms, pruritus have resolved. Rt neck swelling Appears to have resolved. PET/CT scan after 3 cycles shows marked response to chemotherapy.  2) Microcytic anemia - likely anemia of chronic disease due to Hodgkin's lymphoma And chemotherapy and was running a hemoglobin of around 10 .  Had an acute drop in hemoglobin to 5.8 due to scalp laceration with significant blood loss. LDH and haptoglobin within normal limits and rule out hemolysis.  3) Neutrophilic leucocytosis - likely from steroid and Neulasta. No evidence of infection. PLAN  -Patient received 2 units of PRBCs and her hemoglobin is back to 9.1 and she feels good. No other acute new concerns at this time. -Okay to discharge from an oncology hematology standpoint. -She will continue cancer Center follow-up for her continued treatment of Hodgkin's lymphoma as per previous plan. -Much appreciate the help from the hospitalist team.  4) Rash and pruritus  -Grade 1 likely related to chemotherapy. Sweet syndrome is on the differential diagnosis. Plan -Rash appears better -She will continue to use her topical lotrisone. -She was recommended take on the short lukewarm water showers  And use generous amounts of moisturizer. -Recommended not to scratch her skin, this could make it  worse.  -Patient will inform us if the rash gets worse   I spent 25 minutes counseling the patient face to face. The total time spent in the appointment was 30 minutes and more than 50% was on counseling and direct patient cares.    Sullivan Lone  MD MS AAHIVMS [facility] Hematology/Oncology Physician [facility]  (Office):       [phone] (Work cell):  [phone] (Fax):           [phone]  04/27/2017 10:44 AM

## 2017-04-27 NOTE — Progress Notes (Signed)
Initial Nutrition Assessment  DOCUMENTATION CODES:   Non-severe (moderate) malnutrition in context of chronic illness  INTERVENTION:   Boost Plus chocolate TID- Each supplement provides 360kcal and 14g protein.    MVI  NUTRITION DIAGNOSIS:   Malnutrition (moderate) related to cancer and cancer related treatments as evidenced by moderate depletion of body fat, moderate depletions of muscle mass.  GOAL:   Patient will meet greater than or equal to 90% of their needs  MONITOR:   PO intake, Supplement acceptance, Labs, Weight trends  REASON FOR ASSESSMENT:   Malnutrition Screening Tool    ASSESSMENT:   83 y.o. female with medical history of Hodgkins Lymphoma, HTN, hypothyroidism, CAD s/p stent to LAD who fell a few days ago off of her bed and sustained a laceration to her scalp which bled quite a lot. She eventually when to [facility] ED and had 3 staples placed. The subsequent day she developed weakness which has progressed. She presented to the Cancer center today to have blood work done and Hb was found to be 6.6. She was sent to the ER for a blood transfusion. She last received a round of chemo on 5/1.    Met with pt in room today. Pt reports that in May 2017, after her cancer diagnosis, she started having poor appetite and weight loss. Pt lost from 138lbs in May down to 115lbs in Feb 2018. Pt reports that since March 2018, her appetite has increased and she has gained back to 123lbs. Pt reports that her appetite improves every day and that she is currently eating 75-100% of meals and supplements. Pt likes Boost Plus and Premier Protein and drinks these at home; RD will order Boost. Continue to encourage meals and supplements as pt is doing well.    Medications reviewed and include: aspirin, synthroid, niferex   Labs reviewed: K 3.4(L), iCa 1.04(L) Iron 12(L), ferritin 397(H) Hgb 9.1(L), Hct 27.3(L)  Nutrition-Focused physical exam completed. Findings are moderate fat depletion  in arms and chest, moderate to severe muscle depletion over entire upper body, and no edema.   Diet Order:  Diet regular Room service appropriate? Yes; Fluid consistency: Thin Diet - low sodium heart healthy  Skin:  Reviewed, no issues  Last BM:  5/5  Height:   Ht Readings from Last 1 Encounters:  04/26/17 '5\' 4"'$  (1.626 m)    Weight:   Wt Readings from Last 1 Encounters:  04/26/17 123 lb 9.6 oz (56.1 kg)    Ideal Body Weight:  54.5 kg  BMI:  Body mass index is 21.22 kg/m.  Estimated Nutritional Needs:   Kcal:  1500-1800kcal/day   Protein:  67-78g/day   Fluid:  >1.5L/day   EDUCATION NEEDS:   No education needs identified at this time  Koleen Distance, RD, LDN Pager #[phone]

## 2017-04-28 LAB — CBC
HCT: 27.3 % — ABNORMAL LOW (ref 36.0–46.0)
Hemoglobin: 9.1 g/dL — ABNORMAL LOW (ref 12.0–15.0)
MCH: 29.5 pg (ref 26.0–34.0)
MCHC: 33.3 g/dL (ref 30.0–36.0)
MCV: 88.6 fL (ref 78.0–100.0)
Platelets: 258 10*3/uL (ref 150–400)
RBC: 3.08 MIL/uL — ABNORMAL LOW (ref 3.87–5.11)
RDW: 17.1 % — ABNORMAL HIGH (ref 11.5–15.5)
WBC: 6.3 10*3/uL (ref 4.0–10.5)

## 2017-05-04 ENCOUNTER — Ambulatory Visit (HOSPITAL_BASED_OUTPATIENT_CLINIC_OR_DEPARTMENT_OTHER): Payer: Medicare Other

## 2017-05-04 ENCOUNTER — Ambulatory Visit: Payer: Medicare Other

## 2017-05-04 ENCOUNTER — Other Ambulatory Visit: Payer: Self-pay | Admitting: Hematology

## 2017-05-04 ENCOUNTER — Other Ambulatory Visit (HOSPITAL_BASED_OUTPATIENT_CLINIC_OR_DEPARTMENT_OTHER): Payer: Medicare Other

## 2017-05-04 VITALS — BP 107/49 | HR 87 | Temp 98.1°F | Resp 18

## 2017-05-04 DIAGNOSIS — C8111 Nodular sclerosis classical Hodgkin lymphoma, lymph nodes of head, face, and neck: Secondary | ICD-10-CM | POA: Diagnosis not present

## 2017-05-04 DIAGNOSIS — Z5111 Encounter for antineoplastic chemotherapy: Secondary | ICD-10-CM

## 2017-05-04 DIAGNOSIS — Z95828 Presence of other vascular implants and grafts: Secondary | ICD-10-CM

## 2017-05-04 LAB — COMPREHENSIVE METABOLIC PANEL
ALT: 18 U/L (ref 0–55)
AST: 20 U/L (ref 5–34)
Albumin: 3.4 g/dL — ABNORMAL LOW (ref 3.5–5.0)
Alkaline Phosphatase: 144 U/L (ref 40–150)
Anion Gap: 10 mEq/L (ref 3–11)
BUN: 13.4 mg/dL (ref 7.0–26.0)
CO2: 26 mEq/L (ref 22–29)
Calcium: 9.1 mg/dL (ref 8.4–10.4)
Chloride: 105 mEq/L (ref 98–109)
Creatinine: 0.7 mg/dL (ref 0.6–1.1)
EGFR: 77 mL/min/{1.73_m2} — ABNORMAL LOW (ref >90)
Glucose: 89 mg/dl (ref 70–140)
Potassium: 4.2 mEq/L (ref 3.5–5.1)
Sodium: 140 mEq/L (ref 136–145)
Total Bilirubin: 0.31 mg/dL (ref 0.20–1.20)
Total Protein: 6.1 g/dL — ABNORMAL LOW (ref 6.4–8.3)

## 2017-05-04 LAB — CBC WITH DIFFERENTIAL/PLATELET
BASO%: 0.5 % (ref 0.0–2.0)
Basophils Absolute: 0.1 10*3/uL (ref 0.0–0.1)
EOS%: 3.1 % (ref 0.0–7.0)
Eosinophils Absolute: 0.8 10*3/uL — ABNORMAL HIGH (ref 0.0–0.5)
HCT: 34.3 % — ABNORMAL LOW (ref 34.8–46.6)
HGB: 11.1 g/dL — ABNORMAL LOW (ref 11.6–15.9)
LYMPH%: 3.4 % — ABNORMAL LOW (ref 14.0–49.7)
MCH: 29 pg (ref 25.1–34.0)
MCHC: 32.4 g/dL (ref 31.5–36.0)
MCV: 89.6 fL (ref 79.5–101.0)
MONO#: 1.2 10*3/uL — ABNORMAL HIGH (ref 0.1–0.9)
MONO%: 4.8 % (ref 0.0–14.0)
NEUT#: 22.1 10*3/uL — ABNORMAL HIGH (ref 1.5–6.5)
NEUT%: 88.2 % — ABNORMAL HIGH (ref 38.4–76.8)
Platelets: 492 10*3/uL — ABNORMAL HIGH (ref 145–400)
RBC: 3.83 10*6/uL (ref 3.70–5.45)
RDW: 16.5 % — ABNORMAL HIGH (ref 11.2–14.5)
WBC: 25 10*3/uL — ABNORMAL HIGH (ref 3.9–10.3)
lymph#: 0.8 10*3/uL — ABNORMAL LOW (ref 0.9–3.3)

## 2017-05-04 MED ORDER — DOXORUBICIN HCL CHEMO IV INJECTION 2 MG/ML
25.0000 mg/m2 | Freq: Once | INTRAVENOUS | Status: AC
Start: 2017-05-04 — End: 2017-05-04
  Administered 2017-05-04: 40 mg via INTRAVENOUS
  Filled 2017-05-04: qty 20

## 2017-05-04 MED ORDER — VINBLASTINE SULFATE CHEMO INJECTION 1 MG/ML
6.3000 mg/m2 | Freq: Once | INTRAVENOUS | Status: AC
  Administered 2017-05-04: 10 mg via INTRAVENOUS
  Filled 2017-05-04: qty 10

## 2017-05-04 MED ORDER — SODIUM CHLORIDE 0.9 % IV SOLN
375.0000 mg/m2 | Freq: Once | INTRAVENOUS | Status: AC
  Administered 2017-05-04: 600 mg via INTRAVENOUS
  Filled 2017-05-04: qty 30

## 2017-05-04 MED ORDER — SODIUM CHLORIDE 0.9 % IV SOLN
Freq: Once | INTRAVENOUS | Status: AC
  Administered 2017-05-04: 14:00:00 via INTRAVENOUS
  Filled 2017-05-04: qty 5

## 2017-05-04 MED ORDER — HEPARIN SOD (PORK) LOCK FLUSH 100 UNIT/ML IV SOLN
500.0000 [IU] | Freq: Once | INTRAVENOUS | Status: AC | PRN
  Administered 2017-05-04: 500 [IU]
  Filled 2017-05-04: qty 5

## 2017-05-04 MED ORDER — SODIUM CHLORIDE 0.9 % IV SOLN
Freq: Once | INTRAVENOUS | Status: AC
  Administered 2017-05-04: 13:00:00 via INTRAVENOUS

## 2017-05-04 MED ORDER — PALONOSETRON HCL INJECTION 0.25 MG/5ML
INTRAVENOUS | Status: AC
  Filled 2017-05-04: qty 5

## 2017-05-04 MED ORDER — SODIUM CHLORIDE 0.9% FLUSH
10.0000 mL | INTRAVENOUS | Status: DC | PRN
  Administered 2017-05-04: 10 mL via INTRAVENOUS
  Filled 2017-05-04: qty 10

## 2017-05-04 MED ORDER — SODIUM CHLORIDE 0.9% FLUSH
10.0000 mL | INTRAVENOUS | Status: DC | PRN
  Administered 2017-05-04: 10 mL
  Filled 2017-05-04: qty 10

## 2017-05-04 MED ORDER — PALONOSETRON HCL INJECTION 0.25 MG/5ML
0.2500 mg | Freq: Once | INTRAVENOUS | Status: AC
  Administered 2017-05-04: 0.25 mg via INTRAVENOUS

## 2017-05-04 NOTE — Patient Instructions (Addendum)
[place] Discharge Instructions for Patients Receiving Chemotherapy  Today you received the following chemotherapy agents :  Adriamycin, Velban, Dacarbazine  To help prevent nausea and vomiting after your treatment, we encourage you to take your nausea medication    If you develop nausea and vomiting that is not controlled by your nausea medication, call the clinic.   BELOW ARE SYMPTOMS THAT SHOULD BE REPORTED IMMEDIATELY:  *FEVER GREATER THAN 100.5 F  *CHILLS WITH OR WITHOUT FEVER  NAUSEA AND VOMITING THAT IS NOT CONTROLLED WITH YOUR NAUSEA MEDICATION  *UNUSUAL SHORTNESS OF BREATH  *UNUSUAL BRUISING OR BLEEDING  TENDERNESS IN MOUTH AND THROAT WITH OR WITHOUT PRESENCE OF ULCERS  *URINARY PROBLEMS  *BOWEL PROBLEMS  UNUSUAL RASH Items with * indicate a potential emergency and should be followed up as soon as possible.  Feel free to call the clinic you have any questions or concerns. The clinic phone number is [phone].  Please show the [place] at check-in to the Emergency Department and triage nurse.

## 2017-05-06 ENCOUNTER — Telehealth: Payer: Self-pay | Admitting: Hematology

## 2017-05-06 ENCOUNTER — Ambulatory Visit (HOSPITAL_BASED_OUTPATIENT_CLINIC_OR_DEPARTMENT_OTHER): Payer: Medicare Other

## 2017-05-06 VITALS — BP 114/61 | HR 70 | Temp 97.5°F | Resp 20

## 2017-05-06 DIAGNOSIS — C8111 Nodular sclerosis classical Hodgkin lymphoma, lymph nodes of head, face, and neck: Secondary | ICD-10-CM | POA: Diagnosis not present

## 2017-05-06 DIAGNOSIS — Z5189 Encounter for other specified aftercare: Secondary | ICD-10-CM | POA: Diagnosis not present

## 2017-05-06 MED ORDER — PEGFILGRASTIM INJECTION 6 MG/0.6ML ~~LOC~~
6.0000 mg | PREFILLED_SYRINGE | Freq: Once | SUBCUTANEOUS | Status: AC
  Administered 2017-05-06: 6 mg via SUBCUTANEOUS
  Filled 2017-05-06: qty 0.6

## 2017-05-06 NOTE — Patient Instructions (Signed)
Pegfilgrastim injection What is this medicine? PEGFILGRASTIM (PEG fil gra stim) is a long-acting granulocyte colony-stimulating factor that stimulates the growth of neutrophils, a type of white blood cell important in the body's fight against infection. It is used to reduce the incidence of fever and infection in patients with certain types of cancer who are receiving chemotherapy that affects the bone marrow, and to increase survival after being exposed to high doses of radiation. This medicine may be used for other purposes; ask your health care provider or pharmacist if you have questions. COMMON BRAND NAME(S): Neulasta What should I tell my health care provider before I take this medicine? They need to know if you have any of these conditions: -kidney disease -latex allergy -ongoing radiation therapy -sickle cell disease -skin reactions to acrylic adhesives (On-Body Injector only) -an unusual or allergic reaction to pegfilgrastim, filgrastim, other medicines, foods, dyes, or preservatives -pregnant or trying to get pregnant -breast-feeding How should I use this medicine? This medicine is for injection under the skin. If you get this medicine at home, you will be taught how to prepare and give the pre-filled syringe or how to use the On-body Injector. Refer to the patient Instructions for Use for detailed instructions. Use exactly as directed. Tell your healthcare provider immediately if you suspect that the On-body Injector may not have performed as intended or if you suspect the use of the On-body Injector resulted in a missed or partial dose. It is important that you put your used needles and syringes in a special sharps container. Do not put them in a trash can. If you do not have a sharps container, call your pharmacist or healthcare provider to get one. Talk to your pediatrician regarding the use of this medicine in children. While this drug may be prescribed for selected conditions,  precautions do apply. Overdosage: If you think you have taken too much of this medicine contact a poison control center or emergency room at once. NOTE: This medicine is only for you. Do not share this medicine with others. What if I miss a dose? It is important not to miss your dose. Call your doctor or health care professional if you miss your dose. If you miss a dose due to an On-body Injector failure or leakage, a new dose should be administered as soon as possible using a single prefilled syringe for manual use. What may interact with this medicine? Interactions have not been studied. Give your health care provider a list of all the medicines, herbs, non-prescription drugs, or dietary supplements you use. Also tell them if you smoke, drink alcohol, or use illegal drugs. Some items may interact with your medicine. This list may not describe all possible interactions. Give your health care provider a list of all the medicines, herbs, non-prescription drugs, or dietary supplements you use. Also tell them if you smoke, drink alcohol, or use illegal drugs. Some items may interact with your medicine. What should I watch for while using this medicine? You may need blood work done while you are taking this medicine. If you are going to need a MRI, CT scan, or other procedure, tell your doctor that you are using this medicine (On-Body Injector only). What side effects may I notice from receiving this medicine? Side effects that you should report to your doctor or health care professional as soon as possible: -allergic reactions like skin rash, itching or hives, swelling of the face, lips, or tongue -dizziness -fever -pain, redness, or irritation at site   where injected -pinpoint red spots on the skin -red or dark-brown urine -shortness of breath or breathing problems -stomach or side pain, or pain at the shoulder -swelling -tiredness -trouble passing urine or change in the amount of urine Side  effects that usually do not require medical attention (report to your doctor or health care professional if they continue or are bothersome): -bone pain -muscle pain This list may not describe all possible side effects. Call your doctor for medical advice about side effects. You may report side effects to FDA at 1-800-FDA-1088. Where should I keep my medicine? Keep out of the reach of children. Store pre-filled syringes in a refrigerator between 2 and 8 degrees C (36 and 46 degrees F). Do not freeze. Keep in carton to protect from light. Throw away this medicine if it is left out of the refrigerator for more than 48 hours. Throw away any unused medicine after the expiration date. NOTE: This sheet is a summary. It may not cover all possible information. If you have questions about this medicine, talk to your doctor, pharmacist, or health care provider.  2018 Elsevier/Gold Standard (2016-12-03 12:58:03)  

## 2017-05-06 NOTE — Telephone Encounter (Signed)
Spoke with patient about appointment change and she was ok with it

## 2017-05-18 ENCOUNTER — Encounter: Payer: Self-pay | Admitting: *Deleted

## 2017-05-18 NOTE — Progress Notes (Signed)
QI encounter 

## 2017-05-19 ENCOUNTER — Ambulatory Visit: Payer: Medicare Other

## 2017-05-19 ENCOUNTER — Ambulatory Visit (HOSPITAL_BASED_OUTPATIENT_CLINIC_OR_DEPARTMENT_OTHER): Payer: Medicare Other | Admitting: Hematology

## 2017-05-19 ENCOUNTER — Other Ambulatory Visit (HOSPITAL_BASED_OUTPATIENT_CLINIC_OR_DEPARTMENT_OTHER): Payer: Medicare Other

## 2017-05-19 ENCOUNTER — Ambulatory Visit (HOSPITAL_BASED_OUTPATIENT_CLINIC_OR_DEPARTMENT_OTHER): Payer: Medicare Other

## 2017-05-19 VITALS — BP 123/63 | HR 79 | Temp 98.6°F | Resp 17 | Ht 64.0 in | Wt 120.0 lb

## 2017-05-19 DIAGNOSIS — L299 Pruritus, unspecified: Secondary | ICD-10-CM | POA: Diagnosis not present

## 2017-05-19 DIAGNOSIS — R5383 Other fatigue: Secondary | ICD-10-CM | POA: Diagnosis not present

## 2017-05-19 DIAGNOSIS — C8111 Nodular sclerosis classical Hodgkin lymphoma, lymph nodes of head, face, and neck: Secondary | ICD-10-CM | POA: Diagnosis not present

## 2017-05-19 DIAGNOSIS — D72829 Elevated white blood cell count, unspecified: Secondary | ICD-10-CM

## 2017-05-19 DIAGNOSIS — Z5111 Encounter for antineoplastic chemotherapy: Secondary | ICD-10-CM | POA: Diagnosis not present

## 2017-05-19 DIAGNOSIS — M545 Low back pain, unspecified: Secondary | ICD-10-CM

## 2017-05-19 DIAGNOSIS — M4854XA Collapsed vertebra, not elsewhere classified, thoracic region, initial encounter for fracture: Secondary | ICD-10-CM

## 2017-05-19 DIAGNOSIS — R21 Rash and other nonspecific skin eruption: Secondary | ICD-10-CM

## 2017-05-19 DIAGNOSIS — Z95828 Presence of other vascular implants and grafts: Secondary | ICD-10-CM

## 2017-05-19 LAB — COMPREHENSIVE METABOLIC PANEL
ALT: 16 U/L (ref 0–55)
AST: 19 U/L (ref 5–34)
Albumin: 3.3 g/dL — ABNORMAL LOW (ref 3.5–5.0)
Alkaline Phosphatase: 136 U/L (ref 40–150)
Anion Gap: 8 mEq/L (ref 3–11)
BUN: 13.3 mg/dL (ref 7.0–26.0)
CO2: 28 mEq/L (ref 22–29)
Calcium: 9.3 mg/dL (ref 8.4–10.4)
Chloride: 106 mEq/L (ref 98–109)
Creatinine: 0.7 mg/dL (ref 0.6–1.1)
EGFR: 77 mL/min/{1.73_m2} — ABNORMAL LOW (ref >90)
Glucose: 93 mg/dl (ref 70–140)
Potassium: 4.1 mEq/L (ref 3.5–5.1)
Sodium: 142 mEq/L (ref 136–145)
Total Bilirubin: 0.24 mg/dL (ref 0.20–1.20)
Total Protein: 5.8 g/dL — ABNORMAL LOW (ref 6.4–8.3)

## 2017-05-19 LAB — CBC WITH DIFFERENTIAL/PLATELET
BASO%: 0.5 % (ref 0.0–2.0)
Basophils Absolute: 0.1 10*3/uL (ref 0.0–0.1)
EOS%: 2.1 % (ref 0.0–7.0)
Eosinophils Absolute: 0.5 10*3/uL (ref 0.0–0.5)
HCT: 32.6 % — ABNORMAL LOW (ref 34.8–46.6)
HGB: 10.5 g/dL — ABNORMAL LOW (ref 11.6–15.9)
LYMPH%: 4.4 % — ABNORMAL LOW (ref 14.0–49.7)
MCH: 29.1 pg (ref 25.1–34.0)
MCHC: 32.1 g/dL (ref 31.5–36.0)
MCV: 90.5 fL (ref 79.5–101.0)
MONO#: 1 10*3/uL — ABNORMAL HIGH (ref 0.1–0.9)
MONO%: 4.5 % (ref 0.0–14.0)
NEUT#: 20.1 10*3/uL — ABNORMAL HIGH (ref 1.5–6.5)
NEUT%: 88.5 % — ABNORMAL HIGH (ref 38.4–76.8)
Platelets: 458 10*3/uL — ABNORMAL HIGH (ref 145–400)
RBC: 3.6 10*6/uL — ABNORMAL LOW (ref 3.70–5.45)
RDW: 15.5 % — ABNORMAL HIGH (ref 11.2–14.5)
WBC: 22.7 10*3/uL — ABNORMAL HIGH (ref 3.9–10.3)
lymph#: 1 10*3/uL (ref 0.9–3.3)

## 2017-05-19 MED ORDER — VINBLASTINE SULFATE CHEMO INJECTION 1 MG/ML
6.3000 mg/m2 | Freq: Once | INTRAVENOUS | Status: AC
  Administered 2017-05-19: 10 mg via INTRAVENOUS
  Filled 2017-05-19: qty 10

## 2017-05-19 MED ORDER — PALONOSETRON HCL INJECTION 0.25 MG/5ML
INTRAVENOUS | Status: AC
  Filled 2017-05-19: qty 5

## 2017-05-19 MED ORDER — PALONOSETRON HCL INJECTION 0.25 MG/5ML
0.2500 mg | Freq: Once | INTRAVENOUS | Status: AC
  Administered 2017-05-19: 0.25 mg via INTRAVENOUS

## 2017-05-19 MED ORDER — SODIUM CHLORIDE 0.9 % IV SOLN
375.0000 mg/m2 | Freq: Once | INTRAVENOUS | Status: AC
  Administered 2017-05-19: 600 mg via INTRAVENOUS
  Filled 2017-05-19: qty 30

## 2017-05-19 MED ORDER — SODIUM CHLORIDE 0.9% FLUSH
10.0000 mL | INTRAVENOUS | Status: DC | PRN
  Administered 2017-05-19: 10 mL
  Filled 2017-05-19: qty 10

## 2017-05-19 MED ORDER — SODIUM CHLORIDE 0.9% FLUSH
10.0000 mL | INTRAVENOUS | Status: DC | PRN
  Administered 2017-05-19: 10 mL via INTRAVENOUS
  Filled 2017-05-19: qty 10

## 2017-05-19 MED ORDER — SODIUM CHLORIDE 0.9 % IV SOLN
Freq: Once | INTRAVENOUS | Status: AC
  Administered 2017-05-19: 13:00:00 via INTRAVENOUS
  Filled 2017-05-19: qty 5

## 2017-05-19 MED ORDER — DOXORUBICIN HCL CHEMO IV INJECTION 2 MG/ML
25.0000 mg/m2 | Freq: Once | INTRAVENOUS | Status: AC
  Administered 2017-05-19: 40 mg via INTRAVENOUS
  Filled 2017-05-19: qty 20

## 2017-05-19 MED ORDER — SODIUM CHLORIDE 0.9 % IV SOLN
Freq: Once | INTRAVENOUS | Status: AC
  Administered 2017-05-19: 13:00:00 via INTRAVENOUS

## 2017-05-19 MED ORDER — HEPARIN SOD (PORK) LOCK FLUSH 100 UNIT/ML IV SOLN
500.0000 [IU] | Freq: Once | INTRAVENOUS | Status: AC | PRN
  Administered 2017-05-19: 500 [IU]
  Filled 2017-05-19: qty 5

## 2017-05-19 NOTE — Patient Instructions (Signed)
[place] Discharge Instructions for Patients Receiving Chemotherapy  Today you received the following chemotherapy agents: Adriamycin, Vinblastine and Dacarbazine   To help prevent nausea and vomiting after your treatment, we encourage you to take your nausea medication as directed.    If you develop nausea and vomiting that is not controlled by your nausea medication, call the clinic.   BELOW ARE SYMPTOMS THAT SHOULD BE REPORTED IMMEDIATELY:  *FEVER GREATER THAN 100.5 F  *CHILLS WITH OR WITHOUT FEVER  NAUSEA AND VOMITING THAT IS NOT CONTROLLED WITH YOUR NAUSEA MEDICATION  *UNUSUAL SHORTNESS OF BREATH  *UNUSUAL BRUISING OR BLEEDING  TENDERNESS IN MOUTH AND THROAT WITH OR WITHOUT PRESENCE OF ULCERS  *URINARY PROBLEMS  *BOWEL PROBLEMS  UNUSUAL RASH Items with * indicate a potential emergency and should be followed up as soon as possible.  Feel free to call the clinic you have any questions or concerns. The clinic phone number is [phone].  Please show the [place] at check-in to the Emergency Department and triage nurse.

## 2017-05-19 NOTE — Patient Instructions (Signed)

## 2017-05-19 NOTE — Progress Notes (Signed)
Per Dr. [name] no neulasta this treatment.

## 2017-05-20 ENCOUNTER — Telehealth: Payer: Self-pay | Admitting: Hematology

## 2017-05-20 NOTE — Telephone Encounter (Signed)
Scheduled appt per 5/30 LOS - patient aware of appt date and time . Per Rose at [facility] - already called patient and waiting for a call back.

## 2017-05-22 ENCOUNTER — Encounter (HOSPITAL_COMMUNITY): Payer: Self-pay

## 2017-05-22 ENCOUNTER — Emergency Department (HOSPITAL_COMMUNITY): Payer: Medicare Other

## 2017-05-22 ENCOUNTER — Emergency Department (HOSPITAL_COMMUNITY)
Admission: EM | Admit: 2017-05-22 | Discharge: 2017-05-22 | Disposition: A | Discharge status: Home / Self Care | Payer: Medicare Other | Attending: Emergency Medicine | Admitting: Emergency Medicine

## 2017-05-22 DIAGNOSIS — Y929 Unspecified place or not applicable: Secondary | ICD-10-CM | POA: Diagnosis not present

## 2017-05-22 DIAGNOSIS — I119 Hypertensive heart disease without heart failure: Secondary | ICD-10-CM | POA: Diagnosis not present

## 2017-05-22 DIAGNOSIS — Z87891 Personal history of nicotine dependence: Secondary | ICD-10-CM | POA: Diagnosis not present

## 2017-05-22 DIAGNOSIS — X58XXXA Exposure to other specified factors, initial encounter: Secondary | ICD-10-CM | POA: Insufficient documentation

## 2017-05-22 DIAGNOSIS — Z7982 Long term (current) use of aspirin: Secondary | ICD-10-CM | POA: Diagnosis not present

## 2017-05-22 DIAGNOSIS — C8111 Nodular sclerosis classical Hodgkin lymphoma, lymph nodes of head, face, and neck: Secondary | ICD-10-CM | POA: Insufficient documentation

## 2017-05-22 DIAGNOSIS — Y849 Medical procedure, unspecified as the cause of abnormal reaction of the patient, or of later complication, without mention of misadventure at the time of the procedure: Secondary | ICD-10-CM | POA: Diagnosis not present

## 2017-05-22 DIAGNOSIS — Z79899 Other long term (current) drug therapy: Secondary | ICD-10-CM | POA: Insufficient documentation

## 2017-05-22 DIAGNOSIS — Y999 Unspecified external cause status: Secondary | ICD-10-CM | POA: Insufficient documentation

## 2017-05-22 DIAGNOSIS — Z888 Allergy status to other drugs, medicaments and biological substances status: Secondary | ICD-10-CM | POA: Diagnosis not present

## 2017-05-22 DIAGNOSIS — Y939 Activity, unspecified: Secondary | ICD-10-CM | POA: Insufficient documentation

## 2017-05-22 DIAGNOSIS — Z8673 Personal history of transient ischemic attack (TIA), and cerebral infarction without residual deficits: Secondary | ICD-10-CM | POA: Insufficient documentation

## 2017-05-22 DIAGNOSIS — S32040A Wedge compression fracture of fourth lumbar vertebra, initial encounter for closed fracture: Secondary | ICD-10-CM | POA: Insufficient documentation

## 2017-05-22 DIAGNOSIS — M545 Low back pain, unspecified: Secondary | ICD-10-CM

## 2017-05-22 DIAGNOSIS — R350 Frequency of micturition: Secondary | ICD-10-CM | POA: Diagnosis present

## 2017-05-22 DIAGNOSIS — S22000A Wedge compression fracture of unspecified thoracic vertebra, initial encounter for closed fracture: Secondary | ICD-10-CM | POA: Insufficient documentation

## 2017-05-22 DIAGNOSIS — I2510 Atherosclerotic heart disease of native coronary artery without angina pectoris: Secondary | ICD-10-CM | POA: Insufficient documentation

## 2017-05-22 DIAGNOSIS — K5900 Constipation, unspecified: Secondary | ICD-10-CM | POA: Diagnosis not present

## 2017-05-22 LAB — COMPREHENSIVE METABOLIC PANEL
ALT: 18 U/L (ref 14–54)
AST: 21 U/L (ref 15–41)
Albumin: 3.5 g/dL (ref 3.5–5.0)
Alkaline Phosphatase: 97 U/L (ref 38–126)
Anion gap: 6 (ref 5–15)
BUN: 24 mg/dL — ABNORMAL HIGH (ref 6–20)
CO2: 28 mmol/L (ref 22–32)
Calcium: 8.9 mg/dL (ref 8.9–10.3)
Chloride: 106 mmol/L (ref 101–111)
Creatinine, Ser: 0.69 mg/dL (ref 0.44–1.00)
GFR calc Af Amer: >60 mL/min (ref >60)
GFR calc non Af Amer: >60 mL/min (ref >60)
Glucose, Bld: 90 mg/dL (ref 65–99)
Potassium: 3.8 mmol/L (ref 3.5–5.1)
Sodium: 140 mmol/L (ref 135–145)
Total Bilirubin: 0.5 mg/dL (ref 0.3–1.2)
Total Protein: 6 g/dL — ABNORMAL LOW (ref 6.5–8.1)

## 2017-05-22 LAB — CBC WITH DIFFERENTIAL/PLATELET
Basophils Absolute: 0 10*3/uL (ref 0.0–0.1)
Basophils Relative: 0 %
Eosinophils Absolute: 0 10*3/uL (ref 0.0–0.7)
Eosinophils Relative: 0 %
HCT: 29.8 % — ABNORMAL LOW (ref 36.0–46.0)
Hemoglobin: 9.8 g/dL — ABNORMAL LOW (ref 12.0–15.0)
Lymphocytes Relative: 8 %
Lymphs Abs: 0.9 10*3/uL (ref 0.7–4.0)
MCH: 29.8 pg (ref 26.0–34.0)
MCHC: 32.9 g/dL (ref 30.0–36.0)
MCV: 90.6 fL (ref 78.0–100.0)
Monocytes Absolute: 0.3 10*3/uL (ref 0.1–1.0)
Monocytes Relative: 3 %
Neutro Abs: 10.6 10*3/uL — ABNORMAL HIGH (ref 1.7–7.7)
Neutrophils Relative %: 89 %
Platelets: 287 10*3/uL (ref 150–400)
RBC: 3.29 MIL/uL — ABNORMAL LOW (ref 3.87–5.11)
RDW: 15.3 % (ref 11.5–15.5)
WBC: 11.8 10*3/uL — ABNORMAL HIGH (ref 4.0–10.5)

## 2017-05-22 LAB — URINALYSIS, ROUTINE W REFLEX MICROSCOPIC
Bilirubin Urine: NEGATIVE
Glucose, UA: NEGATIVE mg/dL
Hgb urine dipstick: NEGATIVE
Ketones, ur: NEGATIVE mg/dL
Leukocytes, UA: NEGATIVE
Nitrite: NEGATIVE
Protein, ur: NEGATIVE mg/dL
Specific Gravity, Urine: 1.003 — ABNORMAL LOW (ref 1.005–1.030)
pH: 6 (ref 5.0–8.0)

## 2017-05-22 LAB — I-STAT CG4 LACTIC ACID, ED: Lactic Acid, Venous: 1.01 mmol/L (ref 0.5–1.9)

## 2017-05-22 MED ORDER — OXYCODONE-ACETAMINOPHEN 5-325 MG PO TABS
1.0000 | ORAL_TABLET | Freq: Once | ORAL | Status: AC
  Administered 2017-05-22: 1 via ORAL
  Filled 2017-05-22: qty 1

## 2017-05-22 MED ORDER — DOCUSATE SODIUM 100 MG PO CAPS: 100.0000 mg | ORAL_CAPSULE | Freq: Two times a day (BID) | ORAL | 0 refills | Status: DC

## 2017-05-22 MED ORDER — SENNA 8.6 MG PO TABS: 1.0000 | ORAL_TABLET | Freq: Every day | ORAL | 0 refills | Status: DC

## 2017-05-22 MED ORDER — OXYCODONE-ACETAMINOPHEN 5-325 MG PO TABS: 1.0000 | ORAL_TABLET | ORAL | 0 refills | Status: DC | PRN

## 2017-05-22 NOTE — ED Notes (Signed)
Pt transferred to MRI

## 2017-05-22 NOTE — ED Notes (Signed)
Went to get vital signs.   Pt still in MRI

## 2017-05-22 NOTE — Discharge Instructions (Addendum)
It is important that you monitor your condition carefully, and be sure to follow-up with your primary care physician as well as our orthopedic team for further evaluation and discussion of your spinal compression fractures.

## 2017-05-22 NOTE — ED Provider Notes (Signed)
[place] DEPT Provider Note   CSN: [id number] Arrival date & time: 05/22/17  1022     History   Chief Complaint Chief Complaint  Patient presents with  . Back Pain  . Urinary Frequency    HPI Kim Lane is a 83 y.o. female.   Started in right shoulder 2 weeks ago and pain has now moved into mid to upper back, across entire back but worse on R side. She has tried advil, ice followed by icy-hot and heat pads with no relief. Pain is worse with movement. She saw oncologist 3 days ago but no imaging at that time. No trauma or injury. No arm or leg weakness, numbness or urinary retention. She has had some accidents not being able to get to the bathroom fast enough. She reports urinary frequency and urgency, mild discomfort but no hematuria. No fevers, N/V, or diarrhea. No cough/cold symptoms, CP, or SOB.     Back Pain    Urinary Frequency     Past Medical History:  Diagnosis Date  . CAD (coronary artery disease) 02/2006   Taxus stent placed to LAD and Diagonal per Dr. Olevia Perches  . HTN (hypertension)   . Hypercholesterolemia   . Hypothyroidism   . IBS (irritable bowel syndrome)   . Osteoarthritis   . Stroke [facility]) 03/2016    Patient Active Problem List   Diagnosis Date Noted  . Malnutrition of moderate degree 04/27/2017  . Symptomatic anemia 04/26/2017  . Hodgkin lymphoma ([place]) 04/26/2017  . Port catheter in place 02/02/2017  . 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 Dyspnea 09/14/2012  . Epistaxis 06/02/2011  . HYPERCHOLESTEROLEMIA, MIXED 01/08/2009  . Essential hypertension, benign 01/08/2009  . Coronary atherosclerosis 01/08/2009  . Hypothyroidism 01/08/2009    Past Surgical History:  Procedure Laterality Date  . APPENDECTOMY    . IR GENERIC HISTORICAL  01/25/2017   IR US GUIDE VASC ACCESS RIGHT 01/25/2017 [name], MD [facility]-INTER[facility] RAD  . IR GENERIC HISTORICAL  01/25/2017   IR FLUORO GUIDE PORT INSERTION RIGHT 01/25/2017  [name], MD [facility]INTER[facility]  . MASS EXCISION Right 12/22/2016   Procedure: RIGHT NECK LYMPH NODE BIOPSY;  Surgeon: Rozetta Nunnery, MD;  Location: [place];  Service: ENT;  Laterality: Right;  . stents     in heart  . TOTAL KNEE ARTHROPLASTY Bilateral 2011,2012  . VAGINAL HYSTERECTOMY      OB History    No data available       Home Medications    Prior to Admission medications   Medication Sig Start Date End Date Taking? Authorizing Provider  aspirin 81 MG tablet Take 81 mg by mouth daily.      [provider]  co-enzyme Q-10 30 MG capsule Take 30 mg by mouth daily.     [provider]  dexamethasone (DECADRON) 4 MG tablet Take 1 tablet (4 mg total) by mouth daily. Patient not taking: Reported on 04/26/2017 01/18/17   Brunetta Genera, MD  dexamethasone (DECADRON) 4 MG tablet Take 2 tablets by mouth once a day on the day after chemotherapy and then take 2 tablets two times a day for 2 days. Take with food. 04/21/17   Brunetta Genera, MD  iron polysaccharides (NIFEREX) 150 MG capsule Take 1 capsule (150 mg total) by mouth daily. 03/29/17   Brunetta Genera, MD  lactose free nutrition (BOOST PLUS) LIQD Take 237 mLs by mouth 3 (three) times daily  with meals. 04/27/17   Florencia Reasons, MD  levothyroxine (SYNTHROID, LEVOTHROID) 88 MCG tablet Take 88 mcg by mouth daily. 11/04/15   [provider]  lidocaine-prilocaine (EMLA) cream Apply to affected area once 01/19/17   Brunetta Genera, MD  metoprolol succinate (TOPROL-XL) 25 MG 24 hr tablet Take 12.5 mg by mouth every other day.     [provider]  Multiple Vitamins-Minerals (CENTRUM SILVER PO) Take 1 tablet by mouth daily.      [provider]  nitroGLYCERIN (NITROLINGUAL) 0.4 MG/SPRAY spray Place 1 spray under the tongue every 5 (five) minutes x 3 doses as needed for chest pain (3 sprays max).    [provider]  Omega-3 Fatty Acids (FISH OIL) 1000 MG CAPS  Take 1 capsule by mouth every other day.     [provider]  ondansetron (ZOFRAN) 8 MG tablet Take 1 tablet (8 mg total) by mouth 2 (two) times daily as needed. Start on the third day after chemotherapy. 01/19/17   Brunetta Genera, MD  PROAIR HFA 108 [phone] Base) MCG/ACT inhaler  10/29/16   [provider]  prochlorperazine (COMPAZINE) 10 MG tablet Take 1 tablet (10 mg total) by mouth every 6 (six) hours as needed (Nausea or vomiting). 01/19/17   Brunetta Genera, MD  RESTASIS 0.05 % ophthalmic emulsion Place 2 drops into both eyes 2 (two) times daily. For dry eyes 09/19/15   [provider]  traZODone (DESYREL) 50 MG tablet Take 0.5-1 tablets (25-50 mg total) by mouth at bedtime as needed for sleep. 04/06/17   Brunetta Genera, MD  triamcinolone cream (KENALOG) 0.1 % Apply 1 application topically daily.  05/19/16   [provider]    Family History Family History  Problem Relation Age of Onset  . Diabetes Mother   . Dementia Mother   . ALS Mother   . Heart Problems Father   . Liver disease Sister   . Cirrhosis Sister   . Heart block Unknown        CABG  . Diabetes Sister   . Heart block Son        CABG  . Other [place]  . Other Daughter        [place]  . Dementia Sister     Social History Social History  Substance Use Topics  . Smoking status: Former Research scientist (life sciences)  . Smokeless tobacco: Never Used     Comment: quit smoking 30 years ago, very light smoker  . Alcohol use No     Allergies   Fosamax [alendronate sodium]   Review of Systems Review of Systems  Genitourinary: Positive for frequency.  Musculoskeletal: Positive for back pain.     Physical Exam Updated Vital Signs BP (!) 150/64 (BP Location: Right Arm)   Pulse 61   Temp 98.2 F (36.8 C) (Oral)   Resp 18   SpO2 97%   Physical Exam  Constitutional: She is oriented to person, place, and time. She appears well-developed and well-nourished. No  distress.  HENT:  Head: Normocephalic and atraumatic.  Moist mucous membranes  Eyes: Conjunctivae and EOM are normal. Pupils are equal, round, and reactive to light.  Neck: Neck supple.  Cardiovascular: Normal rate, regular rhythm and normal heart sounds.   No murmur heard. Pulmonary/Chest: Effort normal and breath sounds normal.  Abdominal: Soft. Bowel sounds are normal. She exhibits no distension. There is no tenderness.  Musculoskeletal: She exhibits  tenderness. She exhibits no edema.  Tenderness of lower thoracic/upper lumbar midline spine and R paraspinal muscles without bruising or deformity; tenderness of R lateral posterior ribs near scapula without crepitus  Neurological: She is alert and oriented to person, place, and time. She displays normal reflexes. No cranial nerve deficit or sensory deficit.  Fluent speech 5/5 strength x all 4 extremities, no clonus  Skin: Skin is warm and dry.  Scattered ecchymoses arms and legs  Psychiatric: She has a normal mood and affect. Judgment normal.  Nursing note and vitals reviewed.    ED Treatments / Results  Labs (all labs ordered are listed, but only abnormal results are displayed) Labs Reviewed  URINALYSIS, ROUTINE W REFLEX MICROSCOPIC - Abnormal; Notable for the following:       Result Value   Color, Urine STRAW (*)    Specific Gravity, Urine 1.003 (*)    All other components within normal limits  CBC WITH DIFFERENTIAL/PLATELET - Abnormal; Notable for the following:    WBC 11.8 (*)    RBC 3.29 (*)    Hemoglobin 9.8 (*)    HCT 29.8 (*)    Neutro Abs 10.6 (*)    All other components within normal limits  COMPREHENSIVE METABOLIC PANEL - Abnormal; Notable for the following:    BUN 24 (*)    Total Protein 6.0 (*)    All other components within normal limits  I-STAT CG4 LACTIC ACID, ED    EKG  EKG Interpretation None       Radiology Dg Ribs Unilateral W/chest Right  Result Date: 05/22/2017 CLINICAL DATA:  Right back  pain. EXAM: RIGHT RIBS AND CHEST - 3+ VIEW COMPARISON:  Chest x-ray 11/25/2016 FINDINGS: Frontal chest shows hyperexpansion. Subsegmental atelectasis noted left lung base. No pulmonary edema or focal airspace consolidation. Right Port-A-Cath tip overlies the proximal SVC. Oblique views of the right ribs show no displaced right-sided rib fracture. No lytic or sclerotic abnormality identified in the right ribs. IMPRESSION: Negative. Electronically Signed   By: Misty Stanley M.D.   On: 05/22/2017 13:29   Dg Thoracic Spine 2 View  Result Date: 05/22/2017 CLINICAL DATA:  Right-sided back pain and urinary frequency. EXAM: THORACIC SPINE 2 VIEWS COMPARISON:  Thoracic spine and right rib radiographic series - earlier same day ; PET-CT -04/19/2017 FINDINGS: Mild scoliotic curvature of the thoracolumbar spine with dominant caudal component convex the right, potentially positional. No anterolisthesis or retrolisthesis. Age-indeterminate severe (approximately 75%) compression deformity involving the T10 vertebral body. Remaining thoracic vertebral body heights appear preserved. Thoracic intervertebral disc space heights appear preserved Limited visualization adjacent thorax demonstrates atherosclerotic plaque within the thoracic aorta. Tip of right jugular approach port a catheter overlies the superior cavoatrial junction. Indeterminate serpiginous lucencies overlie the right upper abdominal quadrant. IMPRESSION: 1. Age-indeterminate severe (approximately 75%) compression deformity involving the T10 vertebral body. Correlation for point tenderness at this location is recommended. 2. Indeterminate serpiginous lucency overlies the right upper abdominal quadrant, potentially external to the patient, no portal venous gas could have a similar appearance. Clinical correlation is advised. Further evaluation abdominal CT could performed as indicated. 3.  Aortic Atherosclerosis (ICD10-I70.0). Critical Value/emergent results were  called by telephone at the time of interpretation on 05/22/2017 at 1:33 pm to Dr. Theotis Burrow , who verbally acknowledged these results. Electronically Signed   By: Sandi Mariscal M.D.   On: 05/22/2017 13:37   Dg Lumbar Spine Complete  Result Date: 05/22/2017 CLINICAL DATA:  Back pain and urinary frequency. EXAM: LUMBAR  SPINE - COMPLETE 4+ VIEW COMPARISON:  PET-CT -04/19/2017 ; thoracic spine and right rib radiographic series - earlier same day FINDINGS: There are 5 non rib-bearing lumbar type vertebral bodies. There is a very mild scoliotic curvature of the thoracolumbar spine with dominant cranial component convex the right. No anterolisthesis or retrolisthesis. No definite pars defects. Age-indeterminate mild (approximately 25%) compression deformity involving the superior endplate of the L4 vertebral body. Remaining lumbar vertebral body heights appear preserved Mild to moderate multilevel lumbar spine DDD, worse at L2-L3 and L3-L4 with disc space height loss, endplate irregularity and sclerosis. Limited visualization of the bilateral SI joints is normal. Calcified atherosclerotic plaque within the abdominal aorta. Large colonic stool burden without evidence of enteric obstruction. Indeterminate serpiginous lucency overlies the left upper abdominal quadrant. IMPRESSION: 1. Age-indeterminate mild (approximately 25%) compression deformity involving the superior endplate of L4. Correlation for point tenderness at this location is recommended. 2. Mild-to-moderate L2 level are spine DDD, worse at L2-L3 and L3-L4. 3. Large colonic stool burden without evidence of enteric obstruction. 4. Indeterminate serpiginous lucency overlying the right upper abdominal quadrant, potentially artifactual though portal venous gas could have a similar appearance. Clinical correlation is advised. Further evaluation with CT scan the abdomen pelvis could be performed as indicated. Critical Value/emergent results were called by telephone  at the time of interpretation on 05/22/2017 at 1:31 pm to Dr. Theotis Burrow , who verbally acknowledged these results. Electronically Signed   By: Sandi Mariscal M.D.   On: 05/22/2017 13:38   Dg Abd 1 View  Result Date: 05/22/2017 CLINICAL DATA:  Constipation, previous artifact or indeterminate lucency over the right upper quadrant EXAM: ABDOMEN - 1 VIEW COMPARISON:  05/22/2017 FINDINGS: Scattered air and stool throughout the small and large bowel. Moderate retained stool burden throughout the colon suggesting constipation. Previously described indeterminate Right upper quadrant lucency is less apparent on the repeat exam. Lung bases are clear. L4 compression fracture again evident. Bones are osteopenic. IMPRESSION: Negative for bowel obstruction. Moderately large colonic stool burden suggesting constipation. L4 superior endplate compression fracture, age indeterminate Electronically Signed   By: Jerilynn Mages.  Shick M.D.   On: 05/22/2017 14:33    Procedures Procedures (including critical care time)  Medications Ordered in ED Medications  oxyCODONE-acetaminophen (PERCOCET/ROXICET) 5-325 MG per tablet 1 tablet (1 tablet Oral Given 05/22/17 1257)     Initial Impression / Assessment and Plan / ED Course  I have reviewed the triage vital signs and the nursing notes.  Pertinent labs & imaging results that were available during my care of the patient were reviewed by me and considered in my medical decision making (see chart for details).    Pt w/ Worsening mid back pain, also with urinary symptoms including urinary frequency and occasional difficulty with incontinence. She was uncomfortable on exam but nontoxic and in no acute distress. Vital signs reassuring. She had tenderness of her back as above, normal strength and sensation of lower extremities. No skin changes. Lab work overall reassuring, hemoglobin 9.8 which is consistent with her chemotherapy. Obtained plain films which show compression fracture of L4 and  severe compression fracture of T10. The patient had some artifact on her imaging and we obtained KUB which showed that findings were consistent with artifact. She does have large stool burden consistent with constipation. Discussed management with Colace and MiraLAX.  Regarding her fractures, I discussed with the radiologist and then with the patient. I'm concerned about the presence of urinary symptoms over the past 7-10 days which  are new since the pain started. Because of the severity of her T10 fracture, I cannot exclude spinal cord compression. I have ordered an MRI of the T and L-spine for better evaluation. I'm signing out to the oncoming provider who will follow-up on imaging studies.  Final Clinical Impressions(s) / ED Diagnoses   Final diagnoses:  Low back pain  Closed compression fracture of fourth lumbar vertebra, initial encounter ([place])  Closed compression fracture of thoracic vertebra, initial encounter ([place])  Constipation, unspecified constipation type    New Prescriptions New Prescriptions   No medications on file     Desiree Daise, Wenda Overland, MD 05/22/17 1559

## 2017-05-22 NOTE — ED Triage Notes (Addendum)
Pt had last chemo on Wednesday.  Pt now with rt back pain and urinary frequency.  Pt having urgency.  No fever.  No odor.  Pt started with the pain on Wednesday but MD did not xray. Pain is increasing.

## 2017-05-22 NOTE — ED Provider Notes (Signed)
8:14 PM Patient and daughter aware of all findings from MRI. No evidence for cord compression, though there is evidence for multiple compression fractures, as well as degenerative changes. We discussed the importance of following up with primary care and orthopedics, close monitoring.   Carmin Muskrat, MD 05/22/17 2015

## 2017-05-25 ENCOUNTER — Other Ambulatory Visit: Payer: Self-pay | Admitting: Hematology

## 2017-05-25 ENCOUNTER — Telehealth: Payer: Self-pay

## 2017-05-25 ENCOUNTER — Telehealth: Payer: Self-pay | Admitting: Hematology

## 2017-05-25 DIAGNOSIS — C8198 Hodgkin lymphoma, unspecified, lymph nodes of multiple sites: Secondary | ICD-10-CM

## 2017-05-25 DIAGNOSIS — IMO0001 Reserved for inherently not codable concepts without codable children: Secondary | ICD-10-CM

## 2017-05-25 DIAGNOSIS — M4850XA Collapsed vertebra, not elsewhere classified, site unspecified, initial encounter for fracture: Principal | ICD-10-CM

## 2017-05-25 NOTE — Telephone Encounter (Signed)
Nutrition  Called patient's home number and mobile number with no answer or option to leave voicemail for nutrition follow-up.  Will follow-up as able.  Fordyce Lepak B. Zenia Resides, [place], [place] Registered Dietitian [phone] (pager)

## 2017-05-25 NOTE — Telephone Encounter (Signed)
Spoke with patient re lab/fu 6/7 @ 8:45 am - time changed due to lab meeting.

## 2017-05-26 ENCOUNTER — Other Ambulatory Visit: Payer: Self-pay | Admitting: *Deleted

## 2017-05-26 ENCOUNTER — Ambulatory Visit: Payer: Medicare Other | Admitting: Physician Assistant

## 2017-05-27 ENCOUNTER — Ambulatory Visit: Payer: Medicare Other | Admitting: Hematology

## 2017-05-27 ENCOUNTER — Other Ambulatory Visit: Payer: Medicare Other

## 2017-06-01 ENCOUNTER — Other Ambulatory Visit: Payer: Self-pay | Admitting: Hematology

## 2017-06-01 ENCOUNTER — Telehealth: Payer: Self-pay

## 2017-06-02 ENCOUNTER — Telehealth: Payer: Self-pay | Admitting: Cardiovascular Disease

## 2017-06-02 NOTE — Telephone Encounter (Signed)
Walk In pt Form-[facility] Clearance Dropped off. Placed in [employer].

## 2017-06-03 ENCOUNTER — Telehealth: Payer: Self-pay

## 2017-06-03 DIAGNOSIS — Z01818 Encounter for other preprocedural examination: Secondary | ICD-10-CM

## 2017-06-03 DIAGNOSIS — I10 Essential (primary) hypertension: Secondary | ICD-10-CM

## 2017-06-03 DIAGNOSIS — I2510 Atherosclerotic heart disease of native coronary artery without angina pectoris: Secondary | ICD-10-CM

## 2017-06-03 NOTE — Telephone Encounter (Signed)
Informed patient that surgical request has been sent to Dr. Johnsie Cancel. Informed patient that once Dr. Johnsie Cancel gives his recommendations, that our office will fax back to Dr. Rolena Infante office. Patient verbalized understanding.

## 2017-06-03 NOTE — Telephone Encounter (Signed)
New message     Pt wants to know if you filled out the paperwork for [facility] and sent it back

## 2017-06-03 NOTE — Telephone Encounter (Signed)
Called patient to let her know Dr. Johnsie Cancel wants her to have a lexican myoview to be cleared for surgery. Order has been placed. Informed patient that someone from scheduling will be calling to give her an appointment time. Patient verbalized understanding.

## 2017-06-03 NOTE — Telephone Encounter (Signed)
Should have lexiscan myovue to clear for surgery

## 2017-06-03 NOTE — Telephone Encounter (Signed)
Request for surgical clearance:  1. What type of surgery is being performed? T 10 Kyphoplasty  2. When is this surgery scheduled? 06/24/17  3. Are there any medications that need to be held prior to surgery and how long? Aspirin   4. Name of physician performing surgery? Dr. Melina Schools    5. What is your office phone and fax number? [place] Phone [phone], Fax [phone]

## 2017-06-04 ENCOUNTER — Ambulatory Visit: Payer: Medicare Other

## 2017-06-04 ENCOUNTER — Other Ambulatory Visit: Payer: Medicare Other

## 2017-06-07 ENCOUNTER — Telehealth (HOSPITAL_COMMUNITY): Payer: Self-pay | Admitting: *Deleted

## 2017-06-07 NOTE — Telephone Encounter (Signed)
Patient given detailed instructions per Myocardial Perfusion Study Information Sheet for the test on 06/10/17 at 0945. Patient notified to arrive 15 minutes early and that it is imperative to arrive on time for appointment to keep from having the test rescheduled.  If you need to cancel or reschedule your appointment, please call the office within 24 hours of your appointment. . Patient verbalized understanding.Kim Lane, Ranae [place]

## 2017-06-08 ENCOUNTER — Ambulatory Visit: Payer: Self-pay | Admitting: Physician Assistant

## 2017-06-10 ENCOUNTER — Ambulatory Visit (HOSPITAL_COMMUNITY): Payer: Medicare Other | Attending: Cardiovascular Disease

## 2017-06-10 DIAGNOSIS — Z01818 Encounter for other preprocedural examination: Secondary | ICD-10-CM | POA: Insufficient documentation

## 2017-06-10 DIAGNOSIS — I2510 Atherosclerotic heart disease of native coronary artery without angina pectoris: Secondary | ICD-10-CM | POA: Insufficient documentation

## 2017-06-10 DIAGNOSIS — I10 Essential (primary) hypertension: Secondary | ICD-10-CM | POA: Insufficient documentation

## 2017-06-10 DIAGNOSIS — Z01810 Encounter for preprocedural cardiovascular examination: Secondary | ICD-10-CM

## 2017-06-10 LAB — MYOCARDIAL PERFUSION IMAGING
LV dias vol: 67 mL (ref 46–106)
LV sys vol: 28 mL
Peak HR: 99 {beats}/min
RATE: 0.3
Rest HR: 65 {beats}/min
SDS: 7
SRS: 6
SSS: 13
TID: 1.07

## 2017-06-10 MED ORDER — TECHNETIUM TC 99M TETROFOSMIN IV KIT
10.2000 | PACK | Freq: Once | INTRAVENOUS | Status: AC | PRN
  Administered 2017-06-10: 10.2 via INTRAVENOUS
  Filled 2017-06-10: qty 11

## 2017-06-10 MED ORDER — TECHNETIUM TC 99M TETROFOSMIN IV KIT
32.7000 | PACK | Freq: Once | INTRAVENOUS | Status: AC | PRN
Start: 2017-06-10 — End: 2017-06-10
  Administered 2017-06-10: 32.7 via INTRAVENOUS
  Filled 2017-06-10: qty 33

## 2017-06-10 MED ORDER — REGADENOSON 0.4 MG/5ML IV SOLN
0.4000 mg | Freq: Once | INTRAVENOUS | Status: AC
  Administered 2017-06-10: 0.4 mg via INTRAVENOUS

## 2017-06-11 ENCOUNTER — Telehealth: Payer: Self-pay

## 2017-06-11 NOTE — Telephone Encounter (Signed)
Patient had a high risk stress test result. Discussed with Dr. Saunders Revel (DOD) since Dr. Johnsie Cancel is out of the office. He recommended if patient is having symptoms to go to ED, and if patient is feeling okay, to see Dr. Johnsie Cancel or APP early next week to discuss results and possible heart cath. Called patient. Patient stated she was doing great and she had the best night of sleep last night. Informed patient that she would need an office visit to discuss her stress test results. Patient is not aware of her results at this time. Patient is concerned about her surgery next month and being cleared. Patient has appointment on Monday with Dr. Johnsie Cancel.

## 2017-06-12 NOTE — Progress Notes (Signed)
Patient ID: Kim Lane, female   DOB: [birth year], 83 y.o.   MRN: [id number]   Kim Lane is seen today in followup for her coronary artery disease. Needs preop clearance for shoulder surgery She has stents to the LAD and diagonal placed by Dr. Olevia Perches in 2007. She had a nonischemic Myoview in 2012 prior to orthopedic surgery . Able to take zocor and red yeast with LDL of 105 on recent labs from [place].  F/U Myovue 09/06/13 normal EF 80% with no ischemia  No history of PAF. 30 day event monitor echo and bubble negative Seen by EP Dr Curt Bears and patient defers LINQ  Seen in ER 05/22/17 back pain T10 compression fracture no impingement And non acute  Myovue  06/10/17 reviewed and deemed high risk Ordered for preop clearance    Nuclear stress EF: 58%.  There was less than 7mm of horizontal ST segment depression in the inferolateral leads during infusion.  There is a medium defect of moderate severity present in the basal anterolateral, mid anterolateral and apical lateral location. The defect is reversible and consistent with ischemia.  Findings consistent with ischemia.  The left ventricular ejection fraction is normal (55-65%).  This is a high risk study.   She needs back surgery with Dr Rolena Infante Tentatively scheduled for July 5 Involve putting cement in between discs to prevent further collapse  Discussed need for cath Only recent angina was when lymphoma acted up and she was severely anemic  ROS: Denies fever, malais, weight loss, blurry vision, decreased visual acuity, cough, sputum, SOB, hemoptysis, pleuritic pain, palpitaitons, heartburn, abdominal pain, melena, lower extremity edema, claudication, or rash.  All other systems reviewed and negative  General: Affect appropriate Elderly white female  HEENT: normal Neck supple with no adenopathy JVP normal no bruits no thyromegaly Lungs clear with no wheezing and good diaphragmatic motion Heart:  S1/S2 SEM RUSB  murmur, no rub, gallop or  click PMI normal Abdomen: benighn, BS positve, no tenderness, no AAA no bruit.  No HSM or HJR Distal pulses intact with no bruits Skin  with LE varicosities  S/P bilateral knee replacements  Wearing large back brace    Current Outpatient Prescriptions  Medication Sig Dispense Refill  . aspirin 81 MG tablet Take 81 mg by mouth daily.      [name] Kitchen co-enzyme Q-10 30 MG capsule Take 30 mg by mouth daily.     [name] Kitchen docusate sodium (COLACE) 100 MG capsule Take 1 capsule (100 mg total) by mouth every 12 (twelve) hours. 60 capsule 0  . iron polysaccharides (NIFEREX) 150 MG capsule Take 1 capsule (150 mg total) by mouth daily. 30 capsule 1  . lactose free nutrition (BOOST PLUS) LIQD Take 237 mLs by mouth 3 (three) times daily with meals. 30 Can 0  . levothyroxine (SYNTHROID, LEVOTHROID) 88 MCG tablet Take 88 mcg by mouth daily.    [name] Kitchen lidocaine-prilocaine (EMLA) cream Apply to affected area once 30 g 3  . metoprolol succinate (TOPROL-XL) 25 MG 24 hr tablet Take 12.5 mg by mouth every other day.     . Multiple Vitamins-Minerals (CENTRUM SILVER PO) Take 1 tablet by mouth daily.      . nitroGLYCERIN (NITROLINGUAL) 0.4 MG/SPRAY spray Place 1 spray under the tongue every 5 (five) minutes x 3 doses as needed for chest pain (3 sprays max).    . Omega-3 Fatty Acids (FISH OIL) 1000 MG CAPS Take 1 capsule by mouth every other day.     . ondansetron (ZOFRAN) 8  MG tablet Take 1 tablet (8 mg total) by mouth 2 (two) times daily as needed. Start on the third day after chemotherapy. 30 tablet 1  . oxyCODONE-acetaminophen (PERCOCET) 5-325 MG tablet Take 1-2 tablets by mouth every 4 (four) hours as needed for severe pain. 20 tablet 0  . PROAIR HFA 108 (90 Base) MCG/ACT inhaler     . prochlorperazine (COMPAZINE) 10 MG tablet Take 1 tablet (10 mg total) by mouth every 6 (six) hours as needed (Nausea or vomiting). 30 tablet 1  . RESTASIS 0.05 % ophthalmic emulsion Place 2 drops into both eyes 2 (two) times daily. For dry eyes     . senna (SENOKOT) 8.6 MG TABS tablet Take 1 tablet (8.6 mg total) by mouth daily. Until having soft daily stools 30 tablet 0  . traZODone (DESYREL) 50 MG tablet Take 0.5-1 tablets (25-50 mg total) by mouth at bedtime as needed for sleep. 30 tablet 0  . triamcinolone cream (KENALOG) 0.1 % Apply 1 application topically daily.     [name] Kitchen dexamethasone (DECADRON) 4 MG tablet Take 1 tablet (4 mg total) by mouth daily. (Patient not taking: Reported on 06/14/2017) 14 tablet 0  . dexamethasone (DECADRON) 4 MG tablet Take 2 tablets by mouth once a day on the day after chemotherapy and then take 2 tablets two times a day for 2 days. Take with food. (Patient not taking: Reported on 06/14/2017) 30 tablet 1   No current facility-administered medications for this visit.     Allergies  Fosamax [alendronate sodium]  Electrocardiogram:  12/3/ 2015 SR rate 63 normal ECG no change from 2014  05/25/16  NSR rate 79  Normal eCG   Assessment and Plan CAD:  Stent to LAD/Diagonal 2007  High risk myovue 06/10/17  Abnormal myovue for moderate risk Orthopedic back surgery Poor functional status less than 5 METS Discussed cath with patient and Daughter risks including stroke discussed willing to proceed Wendsday with Dr Irish Lack Would try to use BMS if intervention neeeded to expedite back surgery before further disc erosion   Thyroid  Continue replacement TSH normal labs with primary  Shoulder: still painful but back more of an issue  TIA:  Non focal neuro exam for me.  Echo with bubble study negative  ECG totally normal Monitor unrevealing and patient defers LINQ impant at this time   [name]

## 2017-06-14 ENCOUNTER — Ambulatory Visit (INDEPENDENT_AMBULATORY_CARE_PROVIDER_SITE_OTHER): Payer: Medicare Other | Admitting: Cardiovascular Disease

## 2017-06-14 ENCOUNTER — Encounter (INDEPENDENT_AMBULATORY_CARE_PROVIDER_SITE_OTHER): Payer: Self-pay

## 2017-06-14 VITALS — BP 140/70 | HR 76 | Ht 64.0 in | Wt 124.0 lb

## 2017-06-14 DIAGNOSIS — R9439 Abnormal result of other cardiovascular function study: Secondary | ICD-10-CM | POA: Diagnosis not present

## 2017-06-14 LAB — BASIC METABOLIC PANEL
BUN/Creatinine Ratio: 14 (ref 12–28)
BUN: 12 mg/dL (ref 8–27)
CO2: 25 mmol/L (ref 20–29)
Calcium: 9.5 mg/dL (ref 8.7–10.3)
Chloride: 103 mmol/L (ref 96–106)
Creatinine, Ser: 0.86 mg/dL (ref 0.57–1.00)
GFR calc Af Amer: 73 mL/min/{1.73_m2} (ref >59)
GFR calc non Af Amer: 64 mL/min/{1.73_m2} (ref >59)
Glucose: 93 mg/dL (ref 65–99)
Potassium: 4.9 mmol/L (ref 3.5–5.2)
Sodium: 142 mmol/L (ref 134–144)

## 2017-06-14 LAB — CBC WITH DIFFERENTIAL/PLATELET
Basophils Absolute: 0.3 10*3/uL — ABNORMAL HIGH (ref 0.0–0.2)
Basos: 2 %
EOS (ABSOLUTE): 0.7 10*3/uL — ABNORMAL HIGH (ref 0.0–0.4)
Eos: 6 %
Hematocrit: 37.4 % (ref 34.0–46.6)
Hemoglobin: 11.9 g/dL (ref 11.1–15.9)
Immature Grans (Abs): 0.1 10*3/uL (ref 0.0–0.1)
Immature Granulocytes: 1 %
Lymphocytes Absolute: 1.7 10*3/uL (ref 0.7–3.1)
Lymphs: 15 %
MCH: 29.2 pg (ref 26.6–33.0)
MCHC: 31.8 g/dL (ref 31.5–35.7)
MCV: 92 fL (ref 79–97)
Monocytes Absolute: 1.1 10*3/uL — ABNORMAL HIGH (ref 0.1–0.9)
Monocytes: 10 %
Neutrophils Absolute: 7.3 10*3/uL — ABNORMAL HIGH (ref 1.4–7.0)
Neutrophils: 66 %
Platelets: 496 10*3/uL — ABNORMAL HIGH (ref 150–379)
RBC: 4.08 x10E6/uL (ref 3.77–5.28)
RDW: 14.8 % (ref 12.3–15.4)
WBC: 11.1 10*3/uL — ABNORMAL HIGH (ref 3.4–10.8)

## 2017-06-14 LAB — PROTIME-INR
INR: 1 (ref 0.8–1.2)
Prothrombin Time: 10.4 s (ref 9.1–12.0)

## 2017-06-14 NOTE — Patient Instructions (Addendum)
Medication Instructions:  Your physician recommends that you continue on your current medications as directed. Please refer to the Current Medication list given to you today.  Labwork: Your physician recommends that you have lab work today- BMET, CBC, and PT/INR   Testing/Procedures: NONE  Follow-Up: Your physician recommends that you schedule a follow-up appointment in: as directed after your procedure.     If you need a refill on your cardiac medications before your next appointment, please call your pharmacy.    [facility] [address] [place] [address] [place] [address] Dept: [phone] Loc: [place]  06/14/2017  You are scheduled for a Cardiac Catheterization on Wednesday, June 27 with Dr. Larae Grooms.  1. Please arrive at the [facility] (Main Entrance A) at [facility]: [address] [place], [place] [address] at 6:30 AM (two hours before your procedure to ensure your preparation). Free valet parking service is available.   Special note: Every effort is made to have your procedure done on time. Please understand that emergencies sometimes delay scheduled procedures.  2. Diet: Do not eat or drink anything after midnight prior to your procedure except sips of water to take medications.  3. Labs: None needed.  4. Medication instructions in preparation for your procedure  On the morning of your procedure, take your Aspirin and any morning medicines NOT listed above.  You may use sips of water.  5. Plan for one night stay--bring personal belongings. 6. Bring a current list of your medications and current insurance cards. 7. You MUST have a responsible person to drive you home. 8. Someone MUST be with you the first 24 hours after you arrive home or your discharge will be delayed. 9. Please wear clothes that are easy to get on and off and wear slip-on  shoes.  Thank you for allowing [place] to care for you!   -- [facility]  .

## 2017-06-15 ENCOUNTER — Telehealth: Payer: Self-pay

## 2017-06-15 NOTE — Pre-Procedure Instructions (Signed)
Kim Lane  06/15/2017      [facility] - [place] - [address] [name] [address] Phone: [phone] Fax: [phone]  [id number] - [place], [place]. [place] [address] Phone: [phone] Fax: [phone]    Your procedure is scheduled on June 24, 2017.  Report to [facility] Admitting at 1125 AM.  Call this number if you have problems the morning of surgery:  [phone]   Remember:  Do not eat food or drink liquids after midnight.  Take these medicines the morning of surgery with A SIP OF WATER levothyroxine (synthroid), metoprolol (toprol -XL), oxycodone-acetaminophen (percocet), proair inhaler <bring inhaler with you>, restatis eye drops.  7 days prior to surgery STOP taking any Aspirin, Aleve, Naproxen, Ibuprofen, Motrin, Advil, Goody's, BC's, all herbal medications, fish oil, and all vitamins   Do not wear jewelry, make-up or nail polish.  Do not wear lotions, powders, or perfumes, or deoderant.  Do not shave 48 hours prior to surgery.    Do not bring valuables to the hospital.  [facility] is not responsible for any belongings or valuables.  Contacts, dentures or bridgework may not be worn into surgery.  Leave your suitcase in the car.  After surgery it may be brought to your room.  For patients admitted to the hospital, discharge time will be determined by your treatment team.  Patients discharged the day of surgery will not be allowed to drive home.   Special instructions:   [place]- Preparing For Surgery  Before surgery, you can play an important role. Because skin is not sterile, your skin needs to be as free of germs as possible. You can reduce the number of germs on your skin by washing with CHG (chlorahexidine gluconate) Soap before surgery.  CHG is an antiseptic cleaner which kills germs and bonds with the skin to  continue killing germs even after washing.  Please do not use if you have an allergy to CHG or antibacterial soaps. If your skin becomes reddened/irritated stop using the CHG.  Do not shave (including legs and underarms) for at least 48 hours prior to first CHG shower. It is OK to shave your face.  Please follow these instructions carefully.   1. Shower the NIGHT BEFORE SURGERY and the MORNING OF SURGERY with CHG.   2. If you chose to wash your hair, wash your hair first as usual with your normal shampoo.  3. After you shampoo, rinse your hair and body thoroughly to remove the shampoo.  4. Use CHG as you would any other liquid soap. You can apply CHG directly to the skin and wash gently with a scrungie or a clean washcloth.   5. Apply the CHG Soap to your body ONLY FROM THE NECK DOWN.  Do not use on open wounds or open sores. Avoid contact with your eyes, ears, mouth and genitals (private parts). Wash genitals (private parts) with your normal soap.  6. Wash thoroughly, paying special attention to the area where your surgery will be performed.  7. Thoroughly rinse your body with warm water from the neck down.  8. DO NOT shower/wash with your normal soap after using and rinsing off the CHG Soap.  9. Pat yourself dry with a CLEAN TOWEL.   10. Wear CLEAN PAJAMAS   11. Place CLEAN SHEETS on your bed the night of your first shower and  DO NOT SLEEP WITH PETS.    Day of Surgery: Do not apply any deodorants/lotions. Please wear clean clothes to the hospital/surgery center.     Please read over the following fact sheets that you were given. Pain Booklet, Coughing and Deep Breathing, MRSA Information and Surgical Site Infection Prevention

## 2017-06-15 NOTE — Telephone Encounter (Signed)
Patient contacted pre-catheterization at [facility] scheduled for: 06/16/2017 @ 0830  Verified arrival time and place: NT @ 0630  Confirmed AM meds to be taken pre-cath with sip of water:  Notified patient to take baby ASA prior to arrival for procedure.    Confirmed patient has responsible person to drive home post procedure and observe patient for 24 hours: yes-daughter  Addl concerns:  None noted.

## 2017-06-16 ENCOUNTER — Ambulatory Visit (HOSPITAL_COMMUNITY)
Admission: RE | Admit: 2017-06-16 | Discharge: 2017-06-16 | Disposition: A | Discharge status: Home / Self Care | Payer: Medicare Other | Source: Ambulatory Visit | Attending: Interventional Cardiology | Admitting: Interventional Cardiology

## 2017-06-16 ENCOUNTER — Other Ambulatory Visit: Payer: Self-pay

## 2017-06-16 ENCOUNTER — Encounter (HOSPITAL_COMMUNITY): Payer: Self-pay

## 2017-06-16 ENCOUNTER — Encounter (HOSPITAL_COMMUNITY)
Admission: RE | Disposition: A | Discharge status: Home / Self Care | Payer: Self-pay | Source: Ambulatory Visit | Attending: Interventional Cardiology

## 2017-06-16 DIAGNOSIS — M4854XG Collapsed vertebra, not elsewhere classified, thoracic region, subsequent encounter for fracture with delayed healing: Secondary | ICD-10-CM | POA: Diagnosis not present

## 2017-06-16 DIAGNOSIS — C8590 Non-Hodgkin lymphoma, unspecified, unspecified site: Secondary | ICD-10-CM | POA: Diagnosis not present

## 2017-06-16 DIAGNOSIS — Z955 Presence of coronary angioplasty implant and graft: Secondary | ICD-10-CM | POA: Diagnosis not present

## 2017-06-16 DIAGNOSIS — I2584 Coronary atherosclerosis due to calcified coronary lesion: Secondary | ICD-10-CM | POA: Diagnosis not present

## 2017-06-16 DIAGNOSIS — R931 Abnormal findings on diagnostic imaging of heart and coronary circulation: Secondary | ICD-10-CM | POA: Diagnosis present

## 2017-06-16 DIAGNOSIS — I2510 Atherosclerotic heart disease of native coronary artery without angina pectoris: Secondary | ICD-10-CM

## 2017-06-16 DIAGNOSIS — R9439 Abnormal result of other cardiovascular function study: Secondary | ICD-10-CM | POA: Diagnosis present

## 2017-06-16 DIAGNOSIS — Z7982 Long term (current) use of aspirin: Secondary | ICD-10-CM | POA: Diagnosis not present

## 2017-06-16 HISTORY — DX: Angina pectoris, unspecified: I20.9

## 2017-06-16 HISTORY — DX: Other specified postprocedural states: Z98.890

## 2017-06-16 HISTORY — DX: Nausea with vomiting, unspecified: R11.2

## 2017-06-16 HISTORY — DX: Adverse effect of unspecified anesthetic, initial encounter: T41.45XA

## 2017-06-16 HISTORY — DX: Unspecified cataract: H26.9

## 2017-06-16 HISTORY — DX: Other complications of anesthesia, initial encounter: T88.59XA

## 2017-06-16 HISTORY — DX: Disease of blood and blood-forming organs, unspecified: D75.9

## 2017-06-16 HISTORY — PX: LEFT HEART CATH AND CORONARY ANGIOGRAPHY: CATH118249

## 2017-06-16 HISTORY — DX: Anemia, unspecified: D64.9

## 2017-06-16 SURGERY — LEFT HEART CATH AND CORONARY ANGIOGRAPHY
Anesthesia: LOCAL

## 2017-06-16 MED ORDER — OXYCODONE-ACETAMINOPHEN 5-325 MG PO TABS: 1.0000 | ORAL_TABLET | ORAL | Status: DC | PRN

## 2017-06-16 MED ORDER — SODIUM CHLORIDE 0.9 % WEIGHT BASED INFUSION: 1.0000 mL/kg/h | INTRAVENOUS | Status: DC

## 2017-06-16 MED ORDER — IOPAMIDOL (ISOVUE-370) INJECTION 76%
INTRAVENOUS | Status: AC
  Filled 2017-06-16: qty 100

## 2017-06-16 MED ORDER — VERAPAMIL HCL 2.5 MG/ML IV SOLN
INTRAVENOUS | Status: AC
  Filled 2017-06-16: qty 2

## 2017-06-16 MED ORDER — IOPAMIDOL (ISOVUE-370) INJECTION 76%
INTRAVENOUS | Status: DC | PRN
  Administered 2017-06-16: 30 mL via INTRAVENOUS

## 2017-06-16 MED ORDER — SODIUM CHLORIDE 0.9% FLUSH: 3.0000 mL | Freq: Two times a day (BID) | INTRAVENOUS | Status: DC

## 2017-06-16 MED ORDER — HEPARIN (PORCINE) IN NACL 2-0.9 UNIT/ML-% IJ SOLN
INTRAMUSCULAR | Status: AC
  Filled 2017-06-16: qty 500

## 2017-06-16 MED ORDER — OXYCODONE-ACETAMINOPHEN 5-325 MG PO TABS
1.0000 | ORAL_TABLET | Freq: Once | ORAL | Status: AC
  Administered 2017-06-16: 1 via ORAL

## 2017-06-16 MED ORDER — VERAPAMIL HCL 2.5 MG/ML IV SOLN
INTRAVENOUS | Status: DC | PRN
  Administered 2017-06-16: 10 mL via INTRA_ARTERIAL

## 2017-06-16 MED ORDER — FENTANYL CITRATE (PF) 100 MCG/2ML IJ SOLN
INTRAMUSCULAR | Status: AC
  Filled 2017-06-16: qty 2

## 2017-06-16 MED ORDER — FENTANYL CITRATE (PF) 100 MCG/2ML IJ SOLN
INTRAMUSCULAR | Status: DC | PRN
  Administered 2017-06-16: 25 ug via INTRAVENOUS

## 2017-06-16 MED ORDER — SODIUM CHLORIDE 0.9% FLUSH: 3.0000 mL | INTRAVENOUS | Status: DC | PRN

## 2017-06-16 MED ORDER — MIDAZOLAM HCL 2 MG/2ML IJ SOLN
INTRAMUSCULAR | Status: DC | PRN
  Administered 2017-06-16: 1 mg via INTRAVENOUS

## 2017-06-16 MED ORDER — ASPIRIN 81 MG PO CHEW: 81.0000 mg | CHEWABLE_TABLET | ORAL | Status: DC

## 2017-06-16 MED ORDER — HEPARIN SODIUM (PORCINE) 1000 UNIT/ML IJ SOLN
INTRAMUSCULAR | Status: DC | PRN
Start: 2017-06-16 — End: 2017-06-16
  Administered 2017-06-16: 3000 [IU] via INTRAVENOUS

## 2017-06-16 MED ORDER — ONDANSETRON HCL 4 MG/2ML IJ SOLN: 4.0000 mg | Freq: Four times a day (QID) | INTRAMUSCULAR | Status: DC | PRN

## 2017-06-16 MED ORDER — SODIUM CHLORIDE 0.9 % IV SOLN: 250.0000 mL | INTRAVENOUS | Status: DC | PRN

## 2017-06-16 MED ORDER — OXYCODONE-ACETAMINOPHEN 5-325 MG PO TABS
ORAL_TABLET | ORAL | Status: AC
  Administered 2017-06-16: 1 via ORAL
  Filled 2017-06-16: qty 1

## 2017-06-16 MED ORDER — HEPARIN SODIUM (PORCINE) 1000 UNIT/ML IJ SOLN
INTRAMUSCULAR | Status: AC
  Filled 2017-06-16: qty 1

## 2017-06-16 MED ORDER — MIDAZOLAM HCL 2 MG/2ML IJ SOLN
INTRAMUSCULAR | Status: AC
  Filled 2017-06-16: qty 2

## 2017-06-16 MED ORDER — ACETAMINOPHEN 325 MG PO TABS: 650.0000 mg | ORAL_TABLET | ORAL | Status: DC | PRN

## 2017-06-16 MED ORDER — SODIUM CHLORIDE 0.9 % WEIGHT BASED INFUSION
3.0000 mL/kg/h | INTRAVENOUS | Status: AC
  Administered 2017-06-16: 3 mL/kg/h via INTRAVENOUS

## 2017-06-16 MED ORDER — LIDOCAINE HCL (PF) 1 % IJ SOLN
INTRAMUSCULAR | Status: DC | PRN
Start: 2017-06-16 — End: 2017-06-16
  Administered 2017-06-16: 2 mL

## 2017-06-16 MED ORDER — LIDOCAINE HCL (PF) 1 % IJ SOLN
INTRAMUSCULAR | Status: AC
  Filled 2017-06-16: qty 30

## 2017-06-16 SURGICAL SUPPLY — 11 items
CATH INFINITI 5 FR JL3.5 (CATHETERS) ×1 IMPLANT
CATH INFINITI JR4 5F (CATHETERS) ×1 IMPLANT
DEVICE RAD COMP TR BAND LRG (VASCULAR PRODUCTS) ×1 IMPLANT
GLIDESHEATH SLEND SS 6F .021 (SHEATH) ×1 IMPLANT
GUIDEWIRE INQWIRE 1.5J.035X260 (WIRE) IMPLANT
INQWIRE 1.5J .035X260CM (WIRE) ×2
KIT HEART LEFT (KITS) ×2 IMPLANT
PACK CARDIAC CATHETERIZATION (CUSTOM PROCEDURE TRAY) ×2 IMPLANT
TRANSDUCER W/STOPCOCK (MISCELLANEOUS) ×2 IMPLANT
TUBING CIL FLEX 10 FLL-RA (TUBING) ×2 IMPLANT
WIRE HI TORQ VERSACORE-J 145CM (WIRE) ×1 IMPLANT

## 2017-06-16 NOTE — Pre-Procedure Instructions (Addendum)
Kim Lane  06/16/2017      [facility] - [place], [place] - [address] [place] [address] Phone: [phone] Fax: [phone]  [id number] - [place], [place]. [place] [address] Phone: [phone] Fax: [phone]    Your procedure is scheduled on June 24, 2017.  Report to [facility] Admitting at 1125 AM.  Call this number if you have problems the morning of surgery:  [phone]   Remember:  Do not eat food or drink liquids after midnight.  Take these medicines the morning of surgery with A SIP OF WATER levothyroxine (synthroid), metoprolol (toprol -XL), oxycodone-acetaminophen (percocet), proair inhaler <bring inhaler with you>, restatis eye drops. Nitro if needed  7 days prior to surgery star tomorrow 06/17/17 STOP taking any Aspirin, Aleve, Naproxen, Ibuprofen, Motrin, Advil, Goody's, BC's, all herbal medications, fish oil, and all vitamins,coq10   Do not wear jewelry, make-up or nail polish.  Do not wear lotions, powders, or perfumes, or deoderant.  Do not shave 48 hours prior to surgery.    Do not bring valuables to the hospital.  [facility] is not responsible for any belongings or valuables.  Contacts, dentures or bridgework may not be worn into surgery.  Leave your suitcase in the car.  After surgery it may be brought to your room.  For patients admitted to the hospital, discharge time will be determined by your treatment team.  Patients discharged the day of surgery will not be allowed to drive home.   Special instructions:   [place]- Preparing For Surgery  Before surgery, you can play an important role. Because skin is not sterile, your skin needs to be as free of germs as possible. You can reduce the number of germs on your skin by washing with CHG (chlorahexidine gluconate) Soap before surgery.  CHG is an antiseptic cleaner which  kills germs and bonds with the skin to continue killing germs even after washing.  Please do not use if you have an allergy to CHG or antibacterial soaps. If your skin becomes reddened/irritated stop using the CHG.  Do not shave (including legs and underarms) for at least 48 hours prior to first CHG shower. It is OK to shave your face.  Please follow these instructions carefully.   1. Shower the NIGHT BEFORE SURGERY and the MORNING OF SURGERY with CHG.   2. If you chose to wash your hair, wash your hair first as usual with your normal shampoo.  3. After you shampoo, rinse your hair and body thoroughly to remove the shampoo.  4. Use CHG as you would any other liquid soap. You can apply CHG directly to the skin and wash gently with a scrungie or a clean washcloth.   5. Apply the CHG Soap to your body ONLY FROM THE NECK DOWN.  Do not use on open wounds or open sores. Avoid contact with your eyes, ears, mouth and genitals (private parts). Wash genitals (private parts) with your normal soap.  6. Wash thoroughly, paying special attention to the area where your surgery will be performed.  7. Thoroughly rinse your body with warm water from the neck down.  8. DO NOT shower/wash with your normal soap after using and rinsing off the CHG Soap.  9. Pat yourself dry with a CLEAN TOWEL.   10. Wear CLEAN PAJAMAS   11. Place CLEAN SHEETS on your bed the  night of your first shower and DO NOT SLEEP WITH PETS.    Day of Surgery: Do not apply any deodorants/lotions. Please wear clean clothes to the hospital/surgery center.     Please read over the following fact sheets that you were given. Pain Booklet, Coughing and Deep Breathing, MRSA Information and Surgical Site Infection Prevention

## 2017-06-16 NOTE — Interval H&P Note (Signed)
Cath Lab Visit (complete for each Cath Lab visit)  Clinical Evaluation Leading to the Procedure:   ACS: No.  Non-ACS:    Anginal Classification: CCS II  Anti-ischemic medical therapy: Minimal Therapy (1 class of medications)  Non-Invasive Test Results: High-risk stress test findings: cardiac mortality >3%/year  Prior CABG: No previous CABG      History and Physical Interval Note:  06/16/2017 1:42 PM  Kim Lane  has presented today for surgery, with the diagnosis of abnormal stress test, pre0surgical clearance  The various methods of treatment have been discussed with the patient and family. After consideration of risks, benefits and other options for treatment, the patient has consented to  Procedure(s): Left Heart Cath and Coronary Angiography (N/A) as a surgical intervention .  The patient's history has been reviewed, patient examined, no change in status, stable for surgery.  I have reviewed the patient's chart and labs.  Questions were answered to the patient's satisfaction.     Sherren Mocha

## 2017-06-16 NOTE — H&P (View-Only) (Signed)
Patient ID: SUZANN LAZARO, female   DOB: [birth year], 83 y.o.   MRN: [id number]   Robin is seen today in followup for her coronary artery disease. Needs preop clearance for shoulder surgery She has stents to the LAD and diagonal placed by Dr. Olevia Perches in 2007. She had a nonischemic Myoview in 2012 prior to orthopedic surgery . Able to take zocor and red yeast with LDL of 105 on recent labs from [place].  F/U Myovue 09/06/13 normal EF 80% with no ischemia  No history of PAF. 30 day event monitor echo and bubble negative Seen by EP Dr Curt Bears and patient defers LINQ  Seen in ER 05/22/17 back pain T10 compression fracture no impingement And non acute  Myovue  06/10/17 reviewed and deemed high risk Ordered for preop clearance    Nuclear stress EF: 58%.  There was less than 78mm of horizontal ST segment depression in the inferolateral leads during infusion.  There is a medium defect of moderate severity present in the basal anterolateral, mid anterolateral and apical lateral location. The defect is reversible and consistent with ischemia.  Findings consistent with ischemia.  The left ventricular ejection fraction is normal (55-65%).  This is a high risk study.   She needs back surgery with Dr Rolena Infante Tentatively scheduled for July 5 Involve putting cement in between discs to prevent further collapse  Discussed need for cath Only recent angina was when lymphoma acted up and she was severely anemic  ROS: Denies fever, malais, weight loss, blurry vision, decreased visual acuity, cough, sputum, SOB, hemoptysis, pleuritic pain, palpitaitons, heartburn, abdominal pain, melena, lower extremity edema, claudication, or rash.  All other systems reviewed and negative  General: Affect appropriate Elderly white female  HEENT: normal Neck supple with no adenopathy JVP normal no bruits no thyromegaly Lungs clear with no wheezing and good diaphragmatic motion Heart:  S1/S2 SEM RUSB  murmur, no rub, gallop or  click PMI normal Abdomen: benighn, BS positve, no tenderness, no AAA no bruit.  No HSM or HJR Distal pulses intact with no bruits Skin  with LE varicosities  S/P bilateral knee replacements  Wearing large back brace    Current Outpatient Prescriptions  Medication Sig Dispense Refill  . aspirin 81 MG tablet Take 81 mg by mouth daily.      [name] Kitchen co-enzyme Q-10 30 MG capsule Take 30 mg by mouth daily.     [name] Kitchen docusate sodium (COLACE) 100 MG capsule Take 1 capsule (100 mg total) by mouth every 12 (twelve) hours. 60 capsule 0  . iron polysaccharides (NIFEREX) 150 MG capsule Take 1 capsule (150 mg total) by mouth daily. 30 capsule 1  . lactose free nutrition (BOOST PLUS) LIQD Take 237 mLs by mouth 3 (three) times daily with meals. 30 Can 0  . levothyroxine (SYNTHROID, LEVOTHROID) 88 MCG tablet Take 88 mcg by mouth daily.    [name] Kitchen lidocaine-prilocaine (EMLA) cream Apply to affected area once 30 g 3  . metoprolol succinate (TOPROL-XL) 25 MG 24 hr tablet Take 12.5 mg by mouth every other day.     . Multiple Vitamins-Minerals (CENTRUM SILVER PO) Take 1 tablet by mouth daily.      . nitroGLYCERIN (NITROLINGUAL) 0.4 MG/SPRAY spray Place 1 spray under the tongue every 5 (five) minutes x 3 doses as needed for chest pain (3 sprays max).    . Omega-3 Fatty Acids (FISH OIL) 1000 MG CAPS Take 1 capsule by mouth every other day.     . ondansetron (ZOFRAN) 8  MG tablet Take 1 tablet (8 mg total) by mouth 2 (two) times daily as needed. Start on the third day after chemotherapy. 30 tablet 1  . oxyCODONE-acetaminophen (PERCOCET) 5-325 MG tablet Take 1-2 tablets by mouth every 4 (four) hours as needed for severe pain. 20 tablet 0  . PROAIR HFA 108 (90 Base) MCG/ACT inhaler     . prochlorperazine (COMPAZINE) 10 MG tablet Take 1 tablet (10 mg total) by mouth every 6 (six) hours as needed (Nausea or vomiting). 30 tablet 1  . RESTASIS 0.05 % ophthalmic emulsion Place 2 drops into both eyes 2 (two) times daily. For dry eyes     . senna (SENOKOT) 8.6 MG TABS tablet Take 1 tablet (8.6 mg total) by mouth daily. Until having soft daily stools 30 tablet 0  . traZODone (DESYREL) 50 MG tablet Take 0.5-1 tablets (25-50 mg total) by mouth at bedtime as needed for sleep. 30 tablet 0  . triamcinolone cream (KENALOG) 0.1 % Apply 1 application topically daily.     [name] Kitchen dexamethasone (DECADRON) 4 MG tablet Take 1 tablet (4 mg total) by mouth daily. (Patient not taking: Reported on 06/14/2017) 14 tablet 0  . dexamethasone (DECADRON) 4 MG tablet Take 2 tablets by mouth once a day on the day after chemotherapy and then take 2 tablets two times a day for 2 days. Take with food. (Patient not taking: Reported on 06/14/2017) 30 tablet 1   No current facility-administered medications for this visit.     Allergies  Fosamax [alendronate sodium]  Electrocardiogram:  12/3/ 2015 SR rate 63 normal ECG no change from 2014  05/25/16  NSR rate 79  Normal eCG   Assessment and Plan CAD:  Stent to LAD/Diagonal 2007  High risk myovue 06/10/17  Abnormal myovue for moderate risk Orthopedic back surgery Poor functional status less than 5 METS Discussed cath with patient and Daughter risks including stroke discussed willing to proceed Wendsday with Dr Irish Lack Would try to use BMS if intervention neeeded to expedite back surgery before further disc erosion   Thyroid  Continue replacement TSH normal labs with primary  Shoulder: still painful but back more of an issue  TIA:  Non focal neuro exam for me.  Echo with bubble study negative  ECG totally normal Monitor unrevealing and patient defers LINQ impant at this time   [name]

## 2017-06-16 NOTE — Discharge Instructions (Signed)

## 2017-06-17 ENCOUNTER — Encounter (HOSPITAL_COMMUNITY): Payer: Self-pay | Admitting: Cardiovascular Disease

## 2017-06-17 MED FILL — Heparin Sodium (Porcine) 2 Unit/ML in Sodium Chloride 0.9%: INTRAMUSCULAR | Qty: 500 | Status: AC

## 2017-06-17 NOTE — Progress Notes (Addendum)
Anesthesia Chart Review: Patient is a 83 year old female scheduled for T10 kyphoplasty on 06/24/17 by Dr. Rolena Infante.  History includes former smoker, CAD s/p Taxus stent mid LAD and ostial DIAG 03/02/06, angina, hypothyroidism, hypercholesterolemia, hypertension, IBS, CVA 04/2016, Hodgkin's lymphoma (diagnosed 12/2016; IIIB, possible IVB) s/p chemotherapy, anemia (admitted 04/2017), appendectomy, hysterectomy, tonsillectomy, left TKA 05/21/10, right TKA 12/29/10.   PCP is listed as Dr. Leeroy Cha. Cardiologist is Dr. [name]. Who recommended LHC due to high risk stress test. If PCI needed, he would "try to use BMS if intervention neeeded to expedite back surgery before further disc erosion." She saw EP cardiologist Dr. Curt Bears 06/07/16 for EP evaluation following CVA. She opted for 30 day monitor over [facility] monitor, results below.  - HEM-ONC is Dr. Sullivan Lone. - Neurologist is Dr. Andrey Spearman.   Meds include aspirin 81 mg, Niferex, levothyroxine, Toprol-XL, nitroglycerin, fish oil, Percocet, Proair, trazodone.  EKG 06/16/17: NSR.  Cardiac cath 06/16/17 (Dr. Sherren Mocha): 1. Severe three-vessel coronary artery disease with:  Severe mid LAD and first diagonal stenosis with continued patency of the stented segment in the mid LAD  Severe ostial left circumflex and first OM stenoses  Severe proximal RCA and posterior AV segment stenoses 2. Normal LV function by nuclear assessment Will discuss case with Dr. Johnsie Cancel. Complex situation in patient with need for back surgery, remarkably minimal cardiac symptoms, and frailty/poor nutritional status currently being treated for non-Hodgkin's lymphoma. She has  severe three-vessel coronary artery disease, clearly not a candidate for CABG.  Nuclear stress test 06/01/17:  Nuclear stress EF: 58%.  There was less than 82m of horizontal ST segment depression in the inferolateral leads during infusion.  There is a medium defect of moderate  severity present in the basal anterolateral, mid anterolateral and apical lateral location. The defect is reversible and consistent with ischemia.  Findings consistent with ischemia.  The left ventricular ejection fraction is normal (55-65%).  This is a high risk study.  Echo 06/11/16: Study Conclusions - Left ventricle: The cavity size was normal. Wall thickness was   increased in a pattern of mild LVH. Systolic function was normal.   The estimated ejection fraction was in the range of 60% to 65%.   Wall motion was normal; there were no regional wall motion   abnormalities. Doppler parameters are consistent with abnormal   left ventricular relaxation (grade 1 diastolic dysfunction).   Normal strain pattern, GLS -22.7%. - Aortic valve: There was no stenosis. - Mitral valve: Mildly calcified annulus. There was no significant   regurgitation. - Left atrium: The atrium was mildly dilated. - Right ventricle: The cavity size was normal. Systolic function   was normal. - Pulmonary arteries: PA peak pressure: 19 mm Hg (S). - Inferior vena cava: The vessel was normal in size. The   respirophasic diameter changes were in the normal range (= 50%),   consistent with normal central venous pressure. Impressions: - Normal LV size and systolic function, EF 666-29% Mild LV   hypertrophy. Normal RV size and systolic function. No significant   valvular abnormalities.  Cardiac event monitor 07/06/16: Sinus rhythm Occasional PVCs No evidence of atrial fibrillation  MRA neck 04/26/16: IMPRESSION:  This appears to be a normal MR angiogram of the neck vessels showing normal antegrade flow with no focal region of stenosis. The origin of the left vertebral artery is not well seen but this is likely to be artifactual as the study is otherwise normal.    CXR 11/25/16:  IMPRESSION: COPD.  Subsegmental atelectasis in the lingula.  No acute pneumonia. Thoracic aortic atherosclerosis.  PFTs 01/08/17: FVC 2.06  (79%), FEV1 1.44 (74%), DLCOunc 13.42 (55%).   Preoperative labs noted. Cr 0.86. WBC 11.1. H/H 11.9/37.4. PLT 496.  Cardiac clearance is still being discussed. If surgery remains as scheduled I will enter an ISTAT8 for the day of surgery to reassess renal function since labs were done prior to [facility].  Chart will be left for follow-up regarding status of cardiology recommendations.  George Hugh [facility] Short Stay Center/Anesthesiology Phone [phone] 06/17/2017 1:51 PM  Addendum: Per Dr. Johnsie Cancel: "Spoke with Dr Rolena Infante and left message on patient voice female Both understand considerable cardiac risk of surgery but quality of life issues and pain control warrant proceeding with kyphoplasty under local with MAC Continue beta blocker. [facility] available at [facility] 06/24/17 for any issues."  This was followed by notation by Pershing Proud on 06/18/17, "Dr. Johnsie Cancel spoke with patient about up coming surgery and he made her aware of her risk if she has her surgery. Patient verbalized understanding."  I have reviewed cardiac clearance in light of recent cath results with anesthesiologist Dr. Kalman Shan. Cardiology discussed cardiac risks with both Dr. Rolena Infante and patient and at this point the plan is to proceed with known risks. Anesthesiologist to evaluate on the day of surgery. Although case is currently posted as general, local with MAC was discussed as part of the cardiac clearance.   George Hugh [facility] Short Stay Center/Anesthesiology Phone [phone] 06/21/2017 2:33 PM

## 2017-06-18 NOTE — Telephone Encounter (Signed)
F/u Message ° °Pt returning RN call. Please call back to discuss  °

## 2017-06-18 NOTE — Telephone Encounter (Signed)
-----   Message from Kim Hector, MD sent at 06/18/2017 10:33 AM EDT ----- Spoke with Dr Rolena Infante and left message on patient voice female Both understand considerable cardiac risk of surgery but quality of life issues and pain control warrant proceeding with kyphoplasty under local with MAC Continue beta blocker. [facility] available at [facility] 06/24/17 for any issues

## 2017-06-18 NOTE — Telephone Encounter (Signed)
Dr. Johnsie Cancel spoke with patient about up coming surgery and he made her aware of her risk if she has her surgery. Patient verbalized understanding.

## 2017-06-22 NOTE — Progress Notes (Signed)
[name] Kitchen    HEMATOLOGY/ONCOLOGY CLINIC NOTE  Date of Service: .05/19/2017  Patient Care Team: Leeroy Cha, MD as PCP - General (Internal Medicine)  CHIEF COMPLAINTS/PURPOSE OF CONSULTATION:   f/u for continued treatment of Hodgkin's lymphoma  HISTORY OF PRESENTING ILLNESS:   plz see previous   INTERVAL HISTORY  Patient is here for f/u prior to starting her last planned fifth cycle of AVD for her Hodgkin's lymphoma .  Has had some increased fatigue and some unsteadiness and so we discussed and decided to stop at 5 cycles of treatment. She has some back pain had moving a lot of stuff in her house. "I should not have picked up so much stuff" . We discused getting an X ray of her back but she wanted to hold off. She was counseled to seek immediate attention for worsening back pain. She visit ED on 05/22/2017 with increased back pain had X ray and MRI L and T spine and was noted to have significant amount of degenerative changes and a T10 compression fracture with 60% height loss. Pain mx done and patient was discharged home from ED with orthopedics f/u. After discussed with her and her daughter subsequently her C5D15 treatment was discontinued based on FTT and back issues and IR consultation was given to consider kypho/vertebroplasty for pain control.  MEDICAL HISTORY:  Past Medical History:  Diagnosis Date  . Anemia   . Anginal pain ([place])    occ  . Blood dyscrasia 12/2016   hodgins lymphoma tx-remission  . CAD (coronary artery disease) 02/2006   Taxus stent placed to LAD and Diagonal per Dr. Olevia Perches  . Cataracts, bilateral   . Complication of anesthesia   . HTN (hypertension)   . Hypercholesterolemia   . Hypothyroidism   . IBS (irritable bowel syndrome)   . Osteoarthritis   . PONV (postoperative nausea and vomiting)   . Stroke [facility]) 03/2016  Osteoporosis Cardiologist Dr. [name] GI -Dr. Thana Farr  SURGICAL HISTORY: Past Surgical History:  Procedure Laterality Date    . APPENDECTOMY    . IR GENERIC HISTORICAL  01/25/2017   IR US GUIDE VASC ACCESS RIGHT 01/25/2017 [name], MD [facility]  . IR GENERIC HISTORICAL  01/25/2017   IR FLUORO GUIDE PORT INSERTION RIGHT 01/25/2017 [name], MD [facility]  . LEFT HEART CATH AND CORONARY ANGIOGRAPHY N/A 06/16/2017   Procedure: Left Heart Cath and Coronary Angiography;  Surgeon: Sherren Mocha, MD;  Location: [place] CV [facility];  Service: Cardiovascular;  Laterality: N/A;  . MASS EXCISION Right 12/22/2016   Procedure: RIGHT NECK LYMPH NODE BIOPSY;  Surgeon: Rozetta Nunnery, MD;  Location: [facility];  Service: ENT;  Laterality: Right;  . stents     in heart  . TONSILLECTOMY    . TOTAL KNEE ARTHROPLASTY Bilateral 2011,2012  . VAGINAL HYSTERECTOMY      SOCIAL HISTORY: Social History   Social History  . Marital status: Widowed    Spouse name: N/A  . Number of children: 2  . Years of education: 12   Occupational History  .  [employer]   Social History Main Topics  . Smoking status: Former Research scientist (life sciences)  . Smokeless tobacco: Never Used     Comment: quit smoking 30 years ago, very light smoker  . Alcohol use No  . Drug use: No  . Sexual activity: Not on file   Other Topics Concern  . Not on file   Social History Narrative  Lives alone   caffeine use- coffee- 5 cups daily    FAMILY HISTORY: Family History  Problem Relation Age of Onset  . Diabetes Mother   . Dementia Mother   . ALS Mother   . Heart Problems Father   . Liver disease Sister   . Cirrhosis Sister   . Heart block Unknown        CABG  . Diabetes Sister   . Heart block Son        CABG  . Other [place]  . Other Daughter        [place]  . Dementia Sister     ALLERGIES:  is allergic to fosamax [alendronate sodium].  MEDICATIONS:  Current Outpatient Prescriptions  Medication Sig Dispense Refill  . aspirin 81 MG tablet Take 81 mg by mouth daily.       . Coenzyme Q10 100 MG capsule Take 100 mg by mouth daily.     . iron polysaccharides (NIFEREX) 150 MG capsule Take 1 capsule (150 mg total) by mouth daily. 30 capsule 1  . lactose free nutrition (BOOST PLUS) LIQD Take 237 mLs by mouth 3 (three) times daily with meals. 30 Can 0  . levothyroxine (SYNTHROID, LEVOTHROID) 88 MCG tablet Take 88 mcg by mouth daily before breakfast.     . metoprolol succinate (TOPROL-XL) 25 MG 24 hr tablet Take 12.5 mg by mouth every other day.     . Multiple Vitamins-Minerals (CENTRUM SILVER PO) Take 1 tablet by mouth daily.      . nitroGLYCERIN (NITROLINGUAL) 0.4 MG/SPRAY spray Place 1 spray under the tongue every 5 (five) minutes x 3 doses as needed for chest pain (3 sprays max).    . Omega-3 Fatty Acids (FISH OIL) 1000 MG CAPS Take 1 capsule by mouth daily.     [name] Kitchen oxyCODONE-acetaminophen (PERCOCET) 5-325 MG tablet Take 1-2 tablets by mouth every 4 (four) hours as needed for severe pain. 20 tablet 0  . PROAIR HFA 108 (90 Base) MCG/ACT inhaler Inhale 2 puffs into the lungs every 4 (four) hours as needed for shortness of breath.     . RESTASIS 0.05 % ophthalmic emulsion Place 2 drops into both eyes 2 (two) times daily. For dry eyes    . traZODone (DESYREL) 50 MG tablet Take 0.5-1 tablets (25-50 mg total) by mouth at bedtime as needed for sleep. 30 tablet 0  . triamcinolone cream (KENALOG) 0.1 % Apply 1 application topically daily as needed.      No current facility-administered medications for this visit.     REVIEW OF SYSTEMS:    10 Point review of Systems was done is negative except as noted above.  PHYSICAL EXAMINATION: ECOG PERFORMANCE STATUS: 2 - Symptomatic, <50% confined to bed  Vital signs reviewed in Epic  GENERAL:alert, in no acute distress and comfortable SKIN:Dry skin with some areas with scaly itchy. Not on the face or hands . Predominantly on the upper extremities . Areas of senile purpura . EYES: normal, conjunctiva are pink and non-injected,  sclera clear OROPHARYNX:no exudate, no erythema and lips, buccal mucosa, and tongue normal  NECK: supple, no JVD, thyroid normal size, non-tender, without nodularity LYMPH:  no palpable lymphadenopathy in the cervical, axillary or inguinal LUNGS: clear to auscultation with normal respiratory effort HEART: regular rate & rhythm,  no murmurs and no lower extrety edem ABDOMEN: abdomen soft, non-tender, normoactive bowel sounds  Musculoskeletal: no cyanosis of digits and no clubbing  PSYCH: alert & oriented x 3 with fluent speech NEURO: no focal motor/sensory deficits  LABORATORY DATA:  I have reviewed the data as listed   . CBC Latest Ref Rng & Units 05/19/2017  WBC 3.4 - 10.8 x10E3/uL 22.7(H)  Hemoglobin 11.1 - 15.9 g/dL 10.5(L)  Hematocrit 34.0 - 46.6 % 32.6(L)  Platelets 150 - 379 x10E3/uL 458(H)    CMP Latest Ref Rng & Units 05/19/2017  Glucose 65 - 99 mg/dL 93  BUN 8 - 27 mg/dL 13.3  Creatinine 0.57 - 1.00 mg/dL 0.7  Sodium 134 - 144 mmol/L 142  Potassium 3.5 - 5.2 mmol/L 4.1  Chloride 96 - 106 mmol/L -  CO2 20 - 29 mmol/L 28  Calcium 8.7 - 10.3 mg/dL 9.3  Total Protein 6.5 - 8.1 g/dL 5.8(L)  Total Bilirubin 0.3 - 1.2 mg/dL 0.24  Alkaline Phos 38 - 126 U/L 136  AST 15 - 41 U/L 19  ALT 14 - 54 U/L 16     RADIOGRAPHIC STUDIES: I have personally reviewed the radiological images as listed and agreed with the findings in the report. No results found.   Transthoracic Echocardiography  Patient:    Phelan, Goers MR #:       [id number] Study Date: 06/11/2016 Gender:     F Age:        83 Height:     162.6 cm Weight:     62 kg BSA:        1.68 m^2 Pt. Status: Room:   Jetty Duhamel, M.D.  REFERRING    [name], M.D.  ATTENDING    Loralie Champagne, M.D.  PERFORMING   [facility], Outpatient  SONOGRAPHER  Bethany McMahill, RDCS  cc:  ------------------------------------------------------------------- LV EF: 60% -    65%  ------------------------------------------------------------------- Indications:      TIA (G45.9).  ------------------------------------------------------------------- History:   PMH:   Coronary artery disease.  Stroke.  Risk factors: Family history of coronary artery disease. Former tobacco use. Hypertension. Dyslipidemia.  ------------------------------------------------------------------- Study Conclusions  - Left ventricle: The cavity size was normal. Wall thickness was   increased in a pattern of mild LVH. Systolic function was normal.   The estimated ejection fraction was in the range of 60% to 65%.   Wall motion was normal; there were no regional wall motion   abnormalities. Doppler parameters are consistent with abnormal   left ventricular relaxation (grade 1 diastolic dysfunction).   Normal strain pattern, GLS -22.7%. - Aortic valve: There was no stenosis. - Mitral valve: Mildly calcified annulus. There was no significant   regurgitation. - Left atrium: The atrium was mildly dilated. - Right ventricle: The cavity size was normal. Systolic function   was normal. - Pulmonary arteries: PA peak pressure: 19 mm Hg (S). - Inferior vena cava: The vessel was normal in size. The   respirophasic diameter changes were in the normal range (= 50%),   consistent with normal central venous pressure.  Impressions:  - Normal LV size and systolic function, EF 51-88%. Mild LV   hypertrophy. Normal RV size and systolic function. No significant   valvular abnormalities.   ASSESSMENT & PLAN:   83 year old Caucasian female with ECOG performance status of 2 with  1) Classical Hodgkin's lymphoma of nodular sclerosis variety . Atleast Stage IIIB some concern for possible Rt lung involvement which make in Stage IVB Presented with right neck lymph nodes . She had type B constitutional symptoms with 15-20 pound weight loss night sweats  or chills .noted to have significant pruritus  with mild rash likely from Hodgkin's  Wt Readings from Last 3 Encounters:  06/16/17 118 lb (53.5 kg)  06/14/17 124 lb (56.2 kg)  05/22/17 120 lb (54.4 kg)  - her constitutional symptoms, pruritus have resolved. Rt neck swelling Appears to have resolved. PET/CT scan after 3 cycles shows marked response to chemotherapy.  2) Microcytic anemia - likely anemia of chronic disease due to Hodgkin's lymphoma .  No evidence of iron def. Improved after PRBC transfusion . Stable. Now partly from chemotherapy 3) Neutrophilic leucocytosis - likely from steroid and Neulasta. No evidence of infection. PLAN - labs stable. No prohibitive toxicities -continue AVD with G-CSF support will get C5D1 today. -will cancel C5 D15 due to back issues with T10 compression fatigue and increasing fatigue and unsteadiness. (discussed with patient and daughter) -continue trazodone 25-50 mg when necessary for insomnia - patient notes this has really helped her sleep. -fall precautions -Holding neulasta this cycle. Cancer rehab referral for outpatient PT PET/CT in 6 weeks RTC with Dr [name] in 6 weeks with labs and PET/CT  4) Rash and pruritus  -Grade 1 likely related to chemotherapy. Sweet syndrome from neulasta is on the differential diagnosis. -held neulasta last cycle.  5) T10 compression fracture based on imaging done on 05/22/2017 in ED Plan -orthopedics referral and IR referral given -possible role for vertebroplasty/kyphoplasty might be evaluated.  3) . Patient Active Problem List   Diagnosis Date Noted  . Abnormal nuclear cardiac imaging test 06/16/2017  . Malnutrition of moderate degree 04/27/2017  . Symptomatic anemia 04/26/2017  . Hodgkin lymphoma ([place]) 04/26/2017  . Port catheter in place 02/02/2017  . Nodular sclerosis Hodgkin lymphoma of lymph nodes of neck ([place]) 01/13/2017  . Dyspnea 09/14/2012  . Epistaxis 06/02/2011  . HYPERCHOLESTEROLEMIA, MIXED 01/08/2009  . Essential hypertension, benign  01/08/2009  . Coronary atherosclerosis 01/08/2009  . Hypothyroidism 01/08/2009  PLAN -Continue follow-up with primary care physician for management of other chronic medical co-morbidities.  Holding neulasta this cycle. Cancer rehab referral for outpatient PT PET/CT in 6 weeks RTC with Dr [name] in 6 weeks with labs  All of the patients questions were answered with apparent satisfaction. The patient knows to call the clinic with any problems, questions or concerns.  I spent 30 minutes counseling the patient face to face. The total time spent in the appointment was 30 minutes and more than 50% was on counseling and direct patient cares and multiple phone calls to co-ordinate her cares.    Sullivan Lone MD [place] AAHIVMS [facility] Hematology/Oncology Physician [facility]  (Office):       [phone] (Work cell):  [phone] (Fax):           [phone]

## 2017-06-24 ENCOUNTER — Observation Stay (HOSPITAL_COMMUNITY)
Admission: RE | Admit: 2017-06-24 | Discharge: 2017-06-25 | Disposition: A | Discharge status: Home / Self Care | Payer: Medicare Other | Source: Ambulatory Visit | Attending: Orthopedic Surgery | Admitting: Orthopedic Surgery

## 2017-06-24 ENCOUNTER — Ambulatory Visit (HOSPITAL_COMMUNITY): Payer: Medicare Other

## 2017-06-24 ENCOUNTER — Ambulatory Visit (HOSPITAL_COMMUNITY): Payer: Medicare Other | Admitting: Vascular Surgery

## 2017-06-24 ENCOUNTER — Encounter (HOSPITAL_COMMUNITY)
Admission: RE | Disposition: A | Discharge status: Home / Self Care | Payer: Self-pay | Source: Ambulatory Visit | Attending: Orthopedic Surgery

## 2017-06-24 ENCOUNTER — Encounter (HOSPITAL_COMMUNITY): Payer: Self-pay | Admitting: Surgery

## 2017-06-24 DIAGNOSIS — Z7982 Long term (current) use of aspirin: Secondary | ICD-10-CM | POA: Insufficient documentation

## 2017-06-24 DIAGNOSIS — C8190 Hodgkin lymphoma, unspecified, unspecified site: Secondary | ICD-10-CM | POA: Diagnosis not present

## 2017-06-24 DIAGNOSIS — Z419 Encounter for procedure for purposes other than remedying health state, unspecified: Secondary | ICD-10-CM

## 2017-06-24 DIAGNOSIS — S32020A Wedge compression fracture of second lumbar vertebra, initial encounter for closed fracture: Secondary | ICD-10-CM | POA: Diagnosis present

## 2017-06-24 DIAGNOSIS — I10 Essential (primary) hypertension: Secondary | ICD-10-CM | POA: Diagnosis not present

## 2017-06-24 DIAGNOSIS — Z955 Presence of coronary angioplasty implant and graft: Secondary | ICD-10-CM | POA: Insufficient documentation

## 2017-06-24 DIAGNOSIS — I2510 Atherosclerotic heart disease of native coronary artery without angina pectoris: Secondary | ICD-10-CM | POA: Insufficient documentation

## 2017-06-24 DIAGNOSIS — Z87891 Personal history of nicotine dependence: Secondary | ICD-10-CM | POA: Insufficient documentation

## 2017-06-24 DIAGNOSIS — M8088XA Other osteoporosis with current pathological fracture, vertebra(e), initial encounter for fracture: Principal | ICD-10-CM | POA: Insufficient documentation

## 2017-06-24 DIAGNOSIS — D649 Anemia, unspecified: Secondary | ICD-10-CM | POA: Diagnosis not present

## 2017-06-24 DIAGNOSIS — Z96653 Presence of artificial knee joint, bilateral: Secondary | ICD-10-CM | POA: Diagnosis not present

## 2017-06-24 DIAGNOSIS — Z79899 Other long term (current) drug therapy: Secondary | ICD-10-CM | POA: Insufficient documentation

## 2017-06-24 DIAGNOSIS — E039 Hypothyroidism, unspecified: Secondary | ICD-10-CM | POA: Diagnosis not present

## 2017-06-24 DIAGNOSIS — Z8673 Personal history of transient ischemic attack (TIA), and cerebral infarction without residual deficits: Secondary | ICD-10-CM | POA: Insufficient documentation

## 2017-06-24 HISTORY — PX: KYPHOPLASTY: SHX5884

## 2017-06-24 LAB — POCT I-STAT, CHEM 8
BUN: 17 mg/dL (ref 6–20)
Calcium, Ion: 1.12 mmol/L — ABNORMAL LOW (ref 1.15–1.40)
Chloride: 104 mmol/L (ref 101–111)
Creatinine, Ser: 0.8 mg/dL (ref 0.44–1.00)
Glucose, Bld: 111 mg/dL — ABNORMAL HIGH (ref 65–99)
HCT: 39 % (ref 36.0–46.0)
Hemoglobin: 13.3 g/dL (ref 12.0–15.0)
Potassium: 4.1 mmol/L (ref 3.5–5.1)
Sodium: 140 mmol/L (ref 135–145)
TCO2: 27 mmol/L (ref 0–100)

## 2017-06-24 SURGERY — KYPHOPLASTY
Anesthesia: Monitor Anesthesia Care

## 2017-06-24 MED ORDER — FENTANYL CITRATE (PF) 100 MCG/2ML IJ SOLN
INTRAMUSCULAR | Status: DC | PRN
  Administered 2017-06-24 (×5): 25 ug via INTRAVENOUS

## 2017-06-24 MED ORDER — SODIUM CHLORIDE 0.9% FLUSH
3.0000 mL | Freq: Two times a day (BID) | INTRAVENOUS | Status: DC
  Administered 2017-06-24: 3 mL via INTRAVENOUS

## 2017-06-24 MED ORDER — ONDANSETRON HCL 4 MG/2ML IJ SOLN
INTRAMUSCULAR | Status: DC | PRN
  Administered 2017-06-24: 4 mg via INTRAVENOUS

## 2017-06-24 MED ORDER — ONDANSETRON HCL 4 MG PO TABS
4.0000 mg | ORAL_TABLET | Freq: Four times a day (QID) | ORAL | Status: DC | PRN
Start: 2017-06-24 — End: 2017-06-25

## 2017-06-24 MED ORDER — OXYCODONE HCL 5 MG PO TABS
10.0000 mg | ORAL_TABLET | ORAL | Status: DC | PRN
  Administered 2017-06-24: 5 mg via ORAL
  Filled 2017-06-24: qty 2

## 2017-06-24 MED ORDER — PHENYLEPHRINE 40 MCG/ML (10ML) SYRINGE FOR IV PUSH (FOR BLOOD PRESSURE SUPPORT)
PREFILLED_SYRINGE | INTRAVENOUS | Status: AC
  Filled 2017-06-24: qty 10

## 2017-06-24 MED ORDER — ACETAMINOPHEN 650 MG RE SUPP: 650.0000 mg | RECTAL | Status: DC | PRN

## 2017-06-24 MED ORDER — IOPAMIDOL (ISOVUE-300) INJECTION 61%
INTRAVENOUS | Status: AC
  Filled 2017-06-24: qty 50

## 2017-06-24 MED ORDER — CYCLOSPORINE 0.05 % OP EMUL
2.0000 [drp] | Freq: Two times a day (BID) | OPHTHALMIC | Status: DC
Start: 2017-06-24 — End: 2017-06-25
  Administered 2017-06-24: 2 [drp] via OPHTHALMIC
  Filled 2017-06-24 (×2): qty 1

## 2017-06-24 MED ORDER — PHENYLEPHRINE HCL 10 MG/ML IJ SOLN
INTRAMUSCULAR | Status: DC | PRN
  Administered 2017-06-24: 80 ug via INTRAVENOUS

## 2017-06-24 MED ORDER — LACTATED RINGERS IV SOLN
INTRAVENOUS | Status: DC | PRN
  Administered 2017-06-24: 10:00:00 via INTRAVENOUS

## 2017-06-24 MED ORDER — METHOCARBAMOL 500 MG PO TABS: 500.0000 mg | ORAL_TABLET | Freq: Four times a day (QID) | ORAL | Status: DC | PRN

## 2017-06-24 MED ORDER — ALBUTEROL SULFATE (2.5 MG/3ML) 0.083% IN NEBU: 3.0000 mL | INHALATION_SOLUTION | RESPIRATORY_TRACT | Status: DC | PRN

## 2017-06-24 MED ORDER — IOPAMIDOL (ISOVUE-300) INJECTION 61%
INTRAVENOUS | Status: DC | PRN
  Administered 2017-06-24: 50 mL

## 2017-06-24 MED ORDER — NITROGLYCERIN 0.4 MG/SPRAY TL SOLN
1.0000 | Status: DC | PRN
  Filled 2017-06-24: qty 4.9

## 2017-06-24 MED ORDER — ONDANSETRON HCL 4 MG/2ML IJ SOLN: 4.0000 mg | Freq: Four times a day (QID) | INTRAMUSCULAR | Status: DC | PRN

## 2017-06-24 MED ORDER — PHENOL 1.4 % MT LIQD: 1.0000 | OROMUCOSAL | Status: DC | PRN

## 2017-06-24 MED ORDER — ACETAMINOPHEN 325 MG PO TABS: 650.0000 mg | ORAL_TABLET | ORAL | Status: DC | PRN

## 2017-06-24 MED ORDER — CEFAZOLIN SODIUM-DEXTROSE 2-4 GM/100ML-% IV SOLN
2.0000 g | INTRAVENOUS | Status: AC
  Administered 2017-06-24: 2 g via INTRAVENOUS
  Filled 2017-06-24: qty 100

## 2017-06-24 MED ORDER — POLYSACCHARIDE IRON COMPLEX 150 MG PO CAPS
150.0000 mg | ORAL_CAPSULE | Freq: Every day | ORAL | Status: DC
  Filled 2017-06-24: qty 1

## 2017-06-24 MED ORDER — OXYCODONE HCL 5 MG/5ML PO SOLN: 5.0000 mg | Freq: Once | ORAL | Status: DC | PRN

## 2017-06-24 MED ORDER — BUPIVACAINE-EPINEPHRINE (PF) 0.25% -1:200000 IJ SOLN
INTRAMUSCULAR | Status: AC
  Filled 2017-06-24: qty 30

## 2017-06-24 MED ORDER — SODIUM CHLORIDE 0.9 % IV SOLN
250.0000 mL | INTRAVENOUS | Status: DC
Start: 2017-06-24 — End: 2017-06-25

## 2017-06-24 MED ORDER — POLYETHYLENE GLYCOL 3350 17 G PO PACK: 17.0000 g | PACK | Freq: Every day | ORAL | Status: DC | PRN

## 2017-06-24 MED ORDER — PROPOFOL 10 MG/ML IV BOLUS
INTRAVENOUS | Status: DC | PRN
  Administered 2017-06-24: 20 mg via INTRAVENOUS

## 2017-06-24 MED ORDER — LEVOTHYROXINE SODIUM 88 MCG PO TABS
88.0000 ug | ORAL_TABLET | Freq: Every day | ORAL | Status: DC
  Administered 2017-06-25: 88 ug via ORAL
  Filled 2017-06-24: qty 1

## 2017-06-24 MED ORDER — SODIUM CHLORIDE 0.9% FLUSH: 3.0000 mL | INTRAVENOUS | Status: DC | PRN

## 2017-06-24 MED ORDER — PROPOFOL 10 MG/ML IV BOLUS
INTRAVENOUS | Status: AC
  Filled 2017-06-24: qty 20

## 2017-06-24 MED ORDER — METOPROLOL SUCCINATE ER 25 MG PO TB24
12.5000 mg | ORAL_TABLET | ORAL | Status: DC
Start: 2017-06-25 — End: 2017-06-25

## 2017-06-24 MED ORDER — BUPIVACAINE-EPINEPHRINE 0.25% -1:200000 IJ SOLN
INTRAMUSCULAR | Status: DC | PRN
  Administered 2017-06-24: 8 mL

## 2017-06-24 MED ORDER — OXYCODONE-ACETAMINOPHEN 5-325 MG PO TABS: 1.0000 | ORAL_TABLET | Freq: Four times a day (QID) | ORAL | 0 refills | Status: DC | PRN

## 2017-06-24 MED ORDER — MENTHOL 3 MG MT LOZG: 1.0000 | LOZENGE | OROMUCOSAL | Status: DC | PRN

## 2017-06-24 MED ORDER — ONDANSETRON HCL 4 MG/2ML IJ SOLN
INTRAMUSCULAR | Status: AC
  Filled 2017-06-24: qty 2

## 2017-06-24 MED ORDER — MIDAZOLAM HCL 5 MG/5ML IJ SOLN
INTRAMUSCULAR | Status: DC | PRN
  Administered 2017-06-24 (×2): 1 mg via INTRAVENOUS

## 2017-06-24 MED ORDER — METHOCARBAMOL 1000 MG/10ML IJ SOLN
500.0000 mg | Freq: Four times a day (QID) | INTRAVENOUS | Status: DC | PRN
  Filled 2017-06-24: qty 5

## 2017-06-24 MED ORDER — CEFAZOLIN SODIUM-DEXTROSE 2-4 GM/100ML-% IV SOLN
2.0000 g | Freq: Three times a day (TID) | INTRAVENOUS | Status: AC
  Administered 2017-06-24 – 2017-06-25 (×2): 2 g via INTRAVENOUS
  Filled 2017-06-24 (×2): qty 100

## 2017-06-24 MED ORDER — FENTANYL CITRATE (PF) 250 MCG/5ML IJ SOLN
INTRAMUSCULAR | Status: AC
  Filled 2017-06-24: qty 5

## 2017-06-24 MED ORDER — MIDAZOLAM HCL 2 MG/2ML IJ SOLN
INTRAMUSCULAR | Status: AC
  Filled 2017-06-24: qty 2

## 2017-06-24 MED ORDER — LACTATED RINGERS IV SOLN: INTRAVENOUS | Status: DC

## 2017-06-24 MED ORDER — OXYCODONE HCL 5 MG PO TABS: 5.0000 mg | ORAL_TABLET | Freq: Once | ORAL | Status: DC | PRN

## 2017-06-24 MED ORDER — FENTANYL CITRATE (PF) 100 MCG/2ML IJ SOLN: 25.0000 ug | INTRAMUSCULAR | Status: DC | PRN

## 2017-06-24 MED ORDER — OXYCODONE HCL 5 MG PO TABS
5.0000 mg | ORAL_TABLET | ORAL | Status: DC | PRN
  Administered 2017-06-25: 5 mg via ORAL
  Filled 2017-06-24: qty 1

## 2017-06-24 MED ORDER — ONDANSETRON 4 MG PO TBDP: 4.0000 mg | ORAL_TABLET | Freq: Three times a day (TID) | ORAL | 0 refills | Status: DC | PRN

## 2017-06-24 SURGICAL SUPPLY — 47 items
BANDAGE ADH SHEER 1  50/CT (GAUZE/BANDAGES/DRESSINGS) ×3 IMPLANT
BLADE SURG 15 STRL LF DISP TIS (BLADE) ×1 IMPLANT
BLADE SURG 15 STRL SS (BLADE) ×3
BONE FILLER DEVICE STRL SZ3 (INSTRUMENTS) IMPLANT
CEMENT BONE KYPHON CDS (Cement) ×2 IMPLANT
COVER MAYO STAND STRL (DRAPES) ×3 IMPLANT
CURETTE EXPRESS SZ2 7MM (INSTRUMENTS) IMPLANT
CURETTE WEDGE 8.5MM KYPHX (MISCELLANEOUS) IMPLANT
CURRETTE EXPRESS SZ2 7MM (INSTRUMENTS) ×3
DRAPE C-ARM 42X72 X-RAY (DRAPES) ×6 IMPLANT
DRAPE HALF SHEET 40X57 (DRAPES) ×6 IMPLANT
DRAPE INCISE IOBAN 66X45 STRL (DRAPES) ×3 IMPLANT
DRAPE LAPAROTOMY T 102X78X121 (DRAPES) ×3 IMPLANT
DRAPE SURG 17X23 STRL (DRAPES) ×3 IMPLANT
DRAPE U-SHAPE 47X51 STRL (DRAPES) ×3 IMPLANT
DRSG AQUACEL AG ADV 3.5X10 (GAUZE/BANDAGES/DRESSINGS) ×3 IMPLANT
DURAPREP 26ML APPLICATOR (WOUND CARE) ×3 IMPLANT
ELECT PENCIL ROCKER SW 15FT (MISCELLANEOUS) ×3 IMPLANT
GAUZE SPONGE 4X4 16PLY XRAY LF (GAUZE/BANDAGES/DRESSINGS) ×3 IMPLANT
GLOVE BIO SURGEON STRL SZ 6.5 (GLOVE) ×2 IMPLANT
GLOVE BIO SURGEONS STRL SZ 6.5 (GLOVE) ×1
GLOVE BIOGEL PI IND STRL 6.5 (GLOVE) ×1 IMPLANT
GLOVE BIOGEL PI INDICATOR 6.5 (GLOVE) ×2
GLOVE SS BIOGEL STRL SZ 8.5 (GLOVE) ×2 IMPLANT
GLOVE SUPERSENSE BIOGEL SZ 8.5 (GLOVE) ×4
GOWN STRL REUS W/ TWL LRG LVL3 (GOWN DISPOSABLE) ×2 IMPLANT
GOWN STRL REUS W/TWL 2XL LVL3 (GOWN DISPOSABLE) ×3 IMPLANT
GOWN STRL REUS W/TWL LRG LVL3 (GOWN DISPOSABLE) ×6
KIT BASIN OR (CUSTOM PROCEDURE TRAY) ×3 IMPLANT
KIT ROOM TURNOVER OR (KITS) ×3 IMPLANT
NDL HYPO 25X1 1.5 SAFETY (NEEDLE) ×1 IMPLANT
NDL SPNL 18GX3.5 QUINCKE PK (NEEDLE) ×2 IMPLANT
NEEDLE HYPO 25X1 1.5 SAFETY (NEEDLE) ×3 IMPLANT
NEEDLE SPNL 18GX3.5 QUINCKE PK (NEEDLE) ×6 IMPLANT
NS IRRIG 1000ML POUR BTL (IV SOLUTION) ×3 IMPLANT
PACK SURGICAL SETUP 50X90 (CUSTOM PROCEDURE TRAY) ×3 IMPLANT
PACK UNIVERSAL I (CUSTOM PROCEDURE TRAY) ×3 IMPLANT
PAD ARMBOARD 7.5X6 YLW CONV (MISCELLANEOUS) ×6 IMPLANT
SURGIFLO W/THROMBIN 8M KIT (HEMOSTASIS) IMPLANT
SUT BONE WAX W31G (SUTURE) ×3 IMPLANT
SUT MNCRL AB 3-0 PS2 18 (SUTURE) ×3 IMPLANT
SYR CONTROL 10ML LL (SYRINGE) ×3 IMPLANT
TOWEL OR 17X24 6PK STRL BLUE (TOWEL DISPOSABLE) ×3 IMPLANT
TOWEL OR 17X26 10 PK STRL BLUE (TOWEL DISPOSABLE) ×3 IMPLANT
TRAY KYPHOPAK 15/3 ONESTEP 1ST (MISCELLANEOUS) IMPLANT
TRAY KYPHOPAK 20/3 ONESTEP 1ST (MISCELLANEOUS) IMPLANT
WATER STERILE IRR 1000ML POUR (IV SOLUTION) ×3 IMPLANT

## 2017-06-24 NOTE — H&P (Addendum)
History of Present Illness  The patient is a 83 year old female who comes in today for a preoperative History and Physical. The patient is scheduled for a T10 kyphoplasty to be performed by Dr. Duane Lope D. Rolena Infante, MD at [facility] on 06-24-17 . Please see the hospital record for complete dictated history and physical. the pt has CAD. She has 1 stent in her heart.   Problem List/Past Medical  Right shoulder pain, unspecified chronicity (M25.511)  Traumatic rotator cuff tear, right, initial encounter (S46.011A)  Sprain of lumbar spine (S33.5XXA)  Closed wedge compression fracture of tenth thoracic vertebra with routine healing, subsequent encounter (S22.070D)  Contusion of lower back (S30.0XXA)  Problems Reconciled   Allergies NKDA   Family History  Heart Disease  Father. Diabetes Mellitus  Mother, Sister.  Social History  No history of drug/alcohol rehab  Marital status  widowed Not under pain contract  Tobacco use  Never smoker. 08/20/2015 Tobacco / smoke exposure  08/20/2015: no Current drinker  08/20/2015: Currently drinks wine only occasionally per week Children  2 Current work status  working part time Living situation  live alone Exercise  Exercises rarely  Medication History Percocet (5-325MG  Tablet, 1 (one) Oral three times daily, as needed, Taken starting 06/01/2017) Active. Synthroid Active. Toprol Active. Aleve (prn) Active. COQ10 Active. Multivitamin Active. Vitamin D Active. Magnesium Active. Dexamethasone (4MG  Tablet, Oral) Active. Levothyroxine Sodium (88MCG Tablet, Oral) Active. Metoprolol Succinate ER (25MG  Tablet ER 24HR, Oral) Active. Restasis (0.05% Emulsion, Ophthalmic) Active. TraZODone HCl (50MG  Tablet, Oral) Active. Colace (100MG  Capsule, Oral) Active. Nitroglycerin (0.4MG Dorita Fray Solution, Translingual) Active. ProAir HFA (108 (90 Base)MCG/ACT Aerosol Soln, Inhalation) Active. Prochlorperazine Maleate  (10MG  Tablet, Oral) Active. Medications Reconciled  Past Surgical History Total Knee Replacement  bilateral Heart Stents  Breast Biopsy  bilateral  Other Problems  Coronary artery disease  Gout  Hypercholesterolemia  Irritable bowel syndrome  Osteoporosis   Vitals  06/21/2017 9:42 AM Weight: 116 lb Height: 63in Body Surface Area: 1.53 m Body Mass Index: 20.55 kg/m  Temp.: 98.82F(Oral)  Pulse: 75 (Regular)  BP: 143/76 (Sitting, Left Arm, Standard)  General General Appearance-Not in acute distress. Orientation-Oriented X3. Build & Nutrition-Well nourished and Well developed.  Integumentary General Characteristics Surgical Scars - no surgical scar evidence of previous lumbar surgery. Lumbar Spine-Skin examination of the lumbar spine is without deformity, skin lesions, lacerations or abrasions.  Chest and Lung Exam Auscultation Breath sounds - Normal and Clear.  Cardiovascular Auscultation Rhythm - Regular.  Abdomen Palpation/Percussion Palpation and Percussion of the abdomen reveal - Soft, Non Tender and No Rebound tenderness.  Peripheral Vascular Lower Extremity Palpation - Posterior tibial pulse - Bilateral - 2+. Dorsalis pedis pulse - Bilateral - 2+.  Neurologic Sensation Lower Extremity - Bilateral - sensation is intact in the lower extremity. Reflexes Patellar Reflex - Bilateral - 2+. Achilles Reflex - Bilateral - 2+. Testing Seated Straight Leg Raise - Bilateral - Seated straight leg raise negative.  Musculoskeletal Spine/Ribs/Pelvis  Lumbosacral Spine: Inspection and Palpation - Tenderness - left thoracic paraspinals tender to palpation and right thoracic paraspinals tender to palpation. Strength and Tone: Strength - Hip Flexion - Bilateral - 5/5. Knee Extension - Bilateral - 5/5. Knee Flexion - Bilateral - 5/5. Ankle Dorsiflexion - Bilateral - 5/5. Ankle Plantarflexion - Bilateral - 5/5. ROM - Flexion - moderately  decreased range of motion and painful. Extension - moderately decreased range of motion and painful. Left Lateral Bending - moderately decreased range of motion and painful. Right Lateral  Bending - moderately decreased range of motion and painful. Pain - neither flexion or extension is more painful than the other. Lumbosacral Spine - Waddell's Signs - no Waddell's signs present. Lower Extremity Range of Motion - No true hip, knee or ankle pain with range of motion. Gait and Station - Equities trader - Note: TLSO in place.    Assessment & Plan Closed wedge compression fracture of tenth thoracic vertebra    Goal Of Surgery: Discussed that goal of surgery is to reduce pain and improve function and quality of life. Patient is aware that despite all appropriate treatment that there pain and function could be the same, worse, or different.  The patient does have fractures at L4, T11 and T12, but imaging is significant for a new T10 fracture, which does correlate with the level of her pain.  The patient and myself did discuss the fact that she is high risk because of her cardiac pathology. The patient voiced understanding and voiced that she wanted to proceed with the kyphoplasty.  Will plan on admission post-op and cardiology consult if any issues develop.

## 2017-06-24 NOTE — Transfer of Care (Signed)
Immediate Anesthesia Transfer of Care Note  Patient: Kim Lane  Procedure(s) Performed: Procedure(s) with comments: KYPHOPLASTY T10 (N/A) - 90 mins  Patient Location: PACU  Anesthesia Type:MAC  Level of Consciousness: awake, alert  and oriented  Airway & Oxygen Therapy: Patient Spontanous Breathing and Patient connected to nasal cannula oxygen  Post-op Assessment: Report given to RN and Post -op Vital signs reviewed and stable  Post vital signs: Reviewed and stable  Last Vitals:  Vitals:   06/24/17 0827  BP: (!) 144/63  Pulse: 81  Resp: 16  Temp: 36.7 C    Last Pain:  Vitals:   06/24/17 0827  TempSrc: Oral  PainSc: 4       Patients Stated Pain Goal: 8 (37/48/27 0786)  Complications: No apparent anesthesia complications

## 2017-06-24 NOTE — Anesthesia Procedure Notes (Signed)
Procedure Name: MAC Date/Time: 06/24/2017 10:16 AM Performed by: Candis Shine Pre-anesthesia Checklist: Patient identified, Emergency Drugs available, Suction available and Patient being monitored Patient Re-evaluated:Patient Re-evaluated prior to inductionOxygen Delivery Method: Nasal cannula Dental Injury: Teeth and Oropharynx as per pre-operative assessment

## 2017-06-24 NOTE — Brief Op Note (Signed)
06/24/2017  11:11 AM  PATIENT:  Kim Lane  83 y.o. female  PRE-OPERATIVE DIAGNOSIS:  T10 pathologic compression fracture  POST-OPERATIVE DIAGNOSIS:  T10 pathologic compression fracture  PROCEDURE:  Procedure(s) with comments: KYPHOPLASTY T10 (N/A) - 90 mins  SURGEON:  Surgeon(s) and Role:    Melina Schools, MD - Primary  PHYSICIAN ASSISTANT:   ASSISTANTS: Carmen Mayo   ANESTHESIA:   IV sedation  EBL:  Total I/O In: 500 [I.V.:500] Out: 1 [Blood:1]  BLOOD ADMINISTERED:none  DRAINS: none   LOCAL MEDICATIONS USED:  MARCAINE     SPECIMEN:  No Specimen  DISPOSITION OF SPECIMEN:  N/A  COUNTS:  YES  TOURNIQUET:  * No tourniquets in log *  DICTATION: .Dragon Dictation  PLAN OF CARE: Admit to inpatient   PATIENT DISPOSITION:  PACU - hemodynamically stable.

## 2017-06-24 NOTE — Evaluation (Signed)
Physical Therapy Evaluation Patient Details Name: Kim Lane MRN: [id number] DOB: 06-26-1934 Today's Date: 06/24/2017   History of Present Illness  Pt is an 83 yo female admitted on 06/24/17 for T10 kyphoplasty. PMH significant for CAD with stent placement, RTC tear, Gout, osteoporosis, IBS, TKA x2, breast biopsy, CA.   Clinical Impression  Pt presents with the above diagnosis and below deficits for therapy evaluation. Prior to admission, pt lived alone in a single level home and received assistance from family and friends as needed. Pt requires Min guard for mobility this session with [place] for gait on even surfaces. Pt will require continued acute PT services in order to address the below deficits prior to discharge. Pt will need to perform stair negotiation prior to discharge.     Follow Up Recommendations No PT follow up    Equipment Recommendations  None recommended by PT    Recommendations for Other Services OT consult     Precautions / Restrictions Precautions Precautions: Fall Required Braces or Orthoses: Spinal Brace Spinal Brace: Lumbar corset;Applied in sitting position Restrictions Weight Bearing Restrictions: No      Mobility  Bed Mobility Overal bed mobility: Needs Assistance Bed Mobility: Supine to Sit     Supine to sit: Min guard     General bed mobility comments: Min guard for safety to EOB  Transfers Overall transfer level: Needs assistance Equipment used: Straight cane Transfers: Sit to/from Stand Sit to Stand: Min guard         General transfer comment: Min gaurd for safety and to steady once in standing  Ambulation/Gait Ambulation/Gait assistance: Min guard Ambulation Distance (Feet): 250 Feet Assistive device: Straight cane Gait Pattern/deviations: Step-through pattern;Decreased step length - right;Decreased step length - left Gait velocity: decreased Gait velocity interpretation: Below normal speed for age/gender General Gait Details:  slow, steady gait with [place]. Pt with no LOB or diffiuclty with increased distances this session.  Stairs            Wheelchair Mobility    Modified Rankin (Stroke Patients Only)       Balance Overall balance assessment: Needs assistance Sitting-balance support: No upper extremity supported;Feet supported Sitting balance-Leahy Scale: Good     Standing balance support: Single extremity supported;During functional activity Standing balance-Leahy Scale: Fair Standing balance comment: able to stand with [place]                             Pertinent Vitals/Pain Pain Assessment: Faces Faces Pain Scale: Hurts a little bit Pain Location: thoracic region Pain Descriptors / Indicators: Grimacing;Guarding Pain Intervention(s): Monitored during session;Premedicated before session;Repositioned    Home Living Family/patient expects to be discharged to:: Private residence Living Arrangements: Alone Available Help at Discharge: Family;Available 24 hours/day Type of Home: House Home Access: Stairs to enter Entrance Stairs-Rails: Right Entrance Stairs-Number of Steps: 3 Home Layout: One level Home Equipment: Walker - 2 wheels;Cane - single point      Prior Function Level of Independence: Independent with assistive device(s)         Comments: cane in community.      Hand Dominance   Dominant Hand: Right    Extremity/Trunk Assessment   Upper Extremity Assessment Upper Extremity Assessment: Defer to OT evaluation    Lower Extremity Assessment Lower Extremity Assessment: Generalized weakness    Cervical / Trunk Assessment Cervical / Trunk Assessment: Kyphotic  Communication   Communication: No difficulties  Cognition Arousal/Alertness: Awake/alert Behavior During  Therapy: WFL for tasks assessed/performed Overall Cognitive Status: Within Functional Limits for tasks assessed                                        General Comments General  comments (skin integrity, edema, etc.): Pt has recieved cancer treatment recently.     Exercises     Assessment/Plan    PT Assessment Patient needs continued PT services  PT Problem List Decreased strength;Decreased activity tolerance;Decreased balance;Decreased mobility;Decreased knowledge of use of DME       PT Treatment Interventions DME instruction;Gait training;Stair training;Functional mobility training;Therapeutic activities;Therapeutic exercise;Balance training;Patient/family education    PT Goals (Current goals can be found in the Care Plan section)  Acute Rehab PT Goals Patient Stated Goal: to get back home PT Goal Formulation: With patient Time For Goal Achievement: 07/08/17 Potential to Achieve Goals: Good    Frequency Min 5X/week   Barriers to discharge        Co-evaluation               AM-PAC PT "6 Clicks" Daily Activity  Outcome Measure Difficulty turning over in bed (including adjusting bedclothes, sheets and blankets)?: None Difficulty moving from lying on back to sitting on the side of the bed? : None Difficulty sitting down on and standing up from a chair with arms (e.g., wheelchair, bedside commode, etc,.)?: Total Help needed moving to and from a bed to chair (including a wheelchair)?: A Little Help needed walking in hospital room?: A Little Help needed climbing 3-5 steps with a railing? : A Little 6 Click Score: 18    End of Session Equipment Utilized During Treatment: Gait belt Activity Tolerance: Patient tolerated treatment well Patient left: in chair;with call bell/phone within reach Nurse Communication: Mobility status PT Visit Diagnosis: Unsteadiness on feet (R26.81);Muscle weakness (generalized) (M62.81);Pain;Difficulty in walking, not elsewhere classified (R26.2) Pain - Right/Left:  (central) Pain - part of body:  (spine)    Time: 3557-3220 PT Time Calculation (min) (ACUTE ONLY): 21 min   Charges:   PT Evaluation $PT Eval  Moderate Complexity: 1 Procedure     PT G Codes:        Scheryl Marten PT, DPT  [phone]   Shanon Rosser 06/24/2017, 4:46 PM

## 2017-06-24 NOTE — Discharge Instructions (Signed)
Balloon Kyphoplasty, Care After °Refer to this sheet in the next few weeks. These instructions provide you with information about caring for yourself after your procedure. Your health care provider may also give you more specific instructions. Your treatment has been planned according to current medical practices, but problems sometimes occur. Call your health care provider if you have any problems or questions after your procedure. °What can I expect after the procedure? °After your procedure, it is common to have back pain. °Follow these instructions at home: °Incision care °· Follow instructions from your health care provider about how to take care of your incisions. Make sure you: °? Wash your hands with soap and water before you change your bandage (dressing). If soap and water are not available, use hand sanitizer. °? Change your dressing as told by your health care provider. °? Leave stitches (sutures), skin glue, or adhesive strips in place. These skin closures may need to be in place for 2 weeks or longer. If adhesive strip edges start to loosen and curl up, you may trim the loose edges. Do not remove adhesive strips completely unless your health care provider tells you to do that. °· Check your incision area every day for signs of infection. Watch for: °? Redness, swelling, or pain. °? Fluid, blood, or pus. °· Keep your dressing dry until your health care provider says that it can be removed. °Activity ° °· Rest your back and avoid intense physical activity for as long as told by your health care provider. °· Return to your normal activities as told by your health care provider. Ask your health care provider what activities are safe for you. °· Do not lift anything that is heavier than 10 lb (4.5 kg). This is about the weight of a gallon of milk. You may need to avoid heavy lifting for several weeks. °General instructions °· Take over-the-counter and prescription medicines only as told by your health care  provider. °· If directed, apply ice to the painful area: °? Put ice in a plastic bag. °? Place a towel between your skin and the bag. °? Leave the ice on for 20 minutes, 2-3 times per day. °· Do not use tobacco products, including cigarettes, chewing tobacco, or e-cigarettes. If you need help quitting, ask your health care provider. °· Keep all follow-up visits as told by your health care provider. This is important. °Contact a health care provider if: °· You have a fever. °· You have redness, swelling, or pain at the site of your incisions. °· You have fluid, blood, or pus coming from your incisions. °· You have pain that gets worse or does not get better with medicine. °· You develop numbness or weakness in any part of your body. °Get help right away if: °· You have chest pain. °· You have difficulty breathing. °· You cannot move your legs. °· You cannot control your bladder or bowel movements. °· You suddenly become weak or numb on one side of your body. °· You become very confused. °· You have trouble speaking or understanding, or both. °This information is not intended to replace advice given to you by your health care provider. Make sure you discuss any questions you have with your health care provider. °Document Released: 08/28/2015 Document Revised: 05/14/2016 Document Reviewed: 04/01/2015 °Elsevier Interactive Patient Education © 2018 [employer] ° °

## 2017-06-24 NOTE — Op Note (Signed)
Operative note.  Preoperative diagnosis. Osteoporotic T10 compression fracture.  Postoperative diagnosis. Same.  Operative procedure. T10 kyphoplasty.  First assistant [place], [place].  Intraoperative findings. Significant T10 compression fracture. Progressed loss of height compared to preoperative imaging studies. Attempted unilateral kyphoplasty. Minimal cement leak superior and inferior into the disc space.  Indications. A 83 year old woman with long-standing low back pain which is becoming acutely worse over the last 2-1/2 months. X-rays confirmed multiple compression fractures with the most recent being T10. Patient's pain is also located in this area. Discussed treatment options with her, and elected to proceed with a T10 kyphoplasty. All risks and benefits were explained to the patient.  Operative note. Patient was brought the operating room placed supine the operating room table. The back was then prepped and draped in a standard fashion. A timeout was taken to confirm patient procedure and all important data.  IV sedation was then provided by the anesthesiologist, and using biplane fluoroscopy I identified the T10 level. I anesthetized the area with quarter percent Marcaine with epinephrine. It is at this time after reviewing the lateral x-ray that I realized that the T10 vertebral body had significantly collapsed. There is greater loss of height compared to preop. I then elected to at least attempt to place my Jamshidi needle. There are small stab incision I advanced the Jamshidi to the lateral aspect of the T10 pedicle. Taking an extrapedicular approach I advanced the Jamshidi needle through the pedicle and into the posterior aspect of the vertebral body. I advanced him to just beyond the posterior vertebral body. I then placed a curet and attempted created defect that I could place cement. I placed the inflatable bone tamp and attempted to inflate it but it was it was unsuccessful. Is at  this time I then inserted the cement. I was able to get some cement in the central region but started to leak into the superior and inferior disc space. As a result of the leak stopped. I inserted perhaps half a cc to 1 mL of cement    Once the cement was allowed to harden I removed the trocar. I then asked patient to move both lower extremities she was able to move her toes.  At this time point I cleaned the skin placed a suture and Dermabond and a dressing. The patient was then transferred back to the PACU that incident. The end of the case all needle sponge counts were correct.

## 2017-06-24 NOTE — Anesthesia Preprocedure Evaluation (Signed)
Anesthesia Evaluation  Patient identified by MRN, date of birth, ID band Patient awake    Reviewed: Allergy & Precautions, H&P , NPO status , Patient's Chart, lab work & pertinent test results  History of Anesthesia Complications (+) PONV and history of anesthetic complications  Airway Mallampati: II   Neck ROM: full    Dental   Pulmonary shortness of breath, former smoker,    breath sounds clear to auscultation       Cardiovascular hypertension, + angina + CAD and + Cardiac Stents   Rhythm:regular Rate:Normal     Neuro/Psych CVA    GI/Hepatic   Endo/Other  Hypothyroidism   Renal/GU      Musculoskeletal  (+) Arthritis ,   Abdominal   Peds  Hematology   Anesthesia Other Findings   Reproductive/Obstetrics                             Anesthesia Physical Anesthesia Plan  ASA: IV  Anesthesia Plan: MAC   Post-op Pain Management:    Induction: Intravenous  PONV Risk Score and Plan: 3 and Ondansetron, Dexamethasone, Propofol and Midazolam  Airway Management Planned: Nasal Cannula  Additional Equipment:   Intra-op Plan:   Post-operative Plan:   Informed Consent: I have reviewed the patients History and Physical, chart, labs and discussed the procedure including the risks, benefits and alternatives for the proposed anesthesia with the patient or authorized representative who has indicated his/her understanding and acceptance.     Plan Discussed with: CRNA, Anesthesiologist and Surgeon  Anesthesia Plan Comments:         Anesthesia Quick Evaluation

## 2017-06-25 ENCOUNTER — Encounter (HOSPITAL_COMMUNITY): Payer: Self-pay | Admitting: Orthopedic Surgery

## 2017-06-25 DIAGNOSIS — M8088XA Other osteoporosis with current pathological fracture, vertebra(e), initial encounter for fracture: Secondary | ICD-10-CM | POA: Diagnosis not present

## 2017-06-25 NOTE — Evaluation (Signed)
Occupational Therapy Evaluation Patient Details Name: Kim Lane MRN: [id number] DOB: Jul 23, 1934 Today's Date: 06/25/2017    History of Present Illness Pt is an 83 yo female admitted on 06/24/17 for T10 kyphoplasty. PMH significant for CAD with stent placement, RTC tear, Gout, osteoporosis, IBS, TKA x2, breast biopsy, CA.    Clinical Impression   Patient evaluated by Occupational Therapy with no further acute OT needs identified. All education has been completed and the patient has no further questions. See below for any follow-up Occupational Therapy or equipment needs. OT to sign off. Thank you for referral.      Follow Up Recommendations  No OT follow up    Equipment Recommendations  3 in 1 bedside commode (family may be able to borrow )    Recommendations for Other Services       Precautions / Restrictions Precautions Precautions: Fall Precaution Comments: provided back handout and reviewed precautions. pt does not have a back brace currently but reports having a brace at home      Mobility Bed Mobility Overal bed mobility: Needs Assistance Bed Mobility: Rolling Rolling: Supervision         General bed mobility comments: reviewed bed positioning and encouraged not to use the motor on the hospital bed so that pt can simulate home environment  Transfers Overall transfer level: Needs assistance Equipment used: Straight cane Transfers: Sit to/from Stand Sit to Stand: Supervision              Balance                                           ADL either performed or assessed with clinical judgement   ADL Overall ADL's : Needs assistance/impaired Eating/Feeding: Independent   Grooming: Wash/dry hands;Wash/dry face;Independent   Upper Body Bathing: Supervision/ safety   Lower Body Bathing: Supervison/ safety         Lower Body Dressing Details (indicate cue type and reason): pt is able to cross bil Le in bed and eob to complete LB  dressing / bathing             Functional mobility during ADLs: Supervision/safety;Rolling walker (observed in hall with family) General ADL Comments: pt will have family (A ) upon d/c. pt states "ive learned alot since having CA"  Back handout provided and reviewed adls in detail. Pt educated on:  set an alarm at night for medication, avoid sitting for long periods of time, correct bed positioning for sleeping, correct sequence for bed mobility, avoiding lifting more than 5 pounds and never wash directly over incision. All education is complete and patient indicates understanding.  Pt educated on bathing and avoid washing directly on incision. Pt educated to use new wash cloth and towel each day. Pt educated to allow water to run across dressing and not to soak in a tub at this time. Pt advised RN will instruct on any bandages required otherwise is open to air.      Vision Baseline Vision/History: Wears glasses Wears Glasses: Reading only       Perception     Praxis      Pertinent Vitals/Pain Pain Assessment: No/denies pain     Hand Dominance Right   Extremity/Trunk Assessment Upper Extremity Assessment Upper Extremity Assessment: Overall WFL for tasks assessed   Lower Extremity Assessment Lower Extremity Assessment: Defer to PT evaluation  Cervical / Trunk Assessment Cervical / Trunk Assessment: Other exceptions (s/p surg)   Communication Communication Communication: No difficulties   Cognition Arousal/Alertness: Awake/alert Behavior During Therapy: WFL for tasks assessed/performed Overall Cognitive Status: Within Functional Limits for tasks assessed                                     General Comments  reviewed back handout in detial     Exercises     Shoulder Instructions      Home Living Family/patient expects to be discharged to:: Private residence Living Arrangements: Alone Available Help at Discharge: Family;Available 24  hours/day Type of Home: House Home Access: Stairs to enter [employer] of Steps: 3 Entrance Stairs-Rails: Right Home Layout: One level     Bathroom Shower/Tub: Teacher, early years/pre: Standard     Home Equipment: Environmental consultant - 2 wheels;Kasandra Knudsen - single point   Additional Comments: reports daughter is checking with friend to borrow 3n1. grandson present during evaluation      Prior Functioning/Environment Level of Independence: Independent with assistive device(s)        Comments: cane in community.         OT Problem List:        OT Treatment/Interventions:      OT Goals(Current goals can be found in the care plan section) Acute Rehab OT Goals Patient Stated Goal: to get back home  OT Frequency:     Barriers to D/C:            Co-evaluation              AM-PAC PT "6 Clicks" Daily Activity     Outcome Measure Help from another person eating meals?: None Help from another person taking care of personal grooming?: None Help from another person toileting, which includes using toliet, bedpan, or urinal?: None Help from another person bathing (including washing, rinsing, drying)?: None Help from another person to put on and taking off regular upper body clothing?: None Help from another person to put on and taking off regular lower body clothing?: None 6 Click Score: 24   End of Session Nurse Communication: Mobility status;Precautions  Activity Tolerance: Patient tolerated treatment well Patient left: in bed;with call bell/phone within reach;with family/visitor present                   Time: 0811-0828 OT Time Calculation (min): 17 min Charges:  OT General Charges $OT Visit: 1 Procedure OT Evaluation $OT Eval Moderate Complexity: 1 Procedure G-Codes: OT G-codes **NOT FOR INPATIENT CLASS** Functional Assessment Tool Used: Clinical judgement Functional Limitation: Self care Self Care Current Status (Q3335): At least 1 percent but less  than 20 percent impaired, limited or restricted Self Care Discharge Status [phone]): At least 1 percent but less than 20 percent impaired, limited or restricted    Jeri Modena   OTR/L Pager: [phone] Office: [phone] .   Parke Poisson B 06/25/2017, 8:53 AM

## 2017-06-25 NOTE — Anesthesia Postprocedure Evaluation (Signed)
Anesthesia Post Note  Patient: Kim Lane  Procedure(s) Performed: Procedure(s) (LRB): KYPHOPLASTY T10 (N/A)     Patient location during evaluation: PACU Anesthesia Type: MAC Level of consciousness: awake and alert Pain management: pain level controlled Vital Signs Assessment: post-procedure vital signs reviewed and stable Respiratory status: spontaneous breathing, nonlabored ventilation, respiratory function stable and patient connected to nasal cannula oxygen Cardiovascular status: stable and blood pressure returned to baseline Anesthetic complications: no    Last Vitals:  Vitals:   06/25/17 0344 06/25/17 0804  BP: 119/65 128/65  Pulse: 76 68  Resp: 18 16  Temp: 36.7 C 37.1 C    Last Pain:  Vitals:   06/25/17 0344  TempSrc: Oral  PainSc:                  Mok[place]ia S

## 2017-06-25 NOTE — Progress Notes (Signed)
Physical Therapy Treatment Patient Details Name: Kim Lane MRN: [id number] DOB: [birth year] Today's Date: 06/25/2017    History of Present Illness Pt is an 83 yo female admitted on 06/24/17 for T10 kyphoplasty. PMH significant for CAD with stent placement, RTC tear, Gout, osteoporosis, IBS, TKA x2, breast biopsy, CA.     PT Comments    Pt is making good progress towards her goals. Pt currently supervision for bed mobility, transfers, ambulation of 500 feet with SPC, and ascend/descend of 5 stairs. Pt requires skilled PT in acute setting to progress ambulation and to improve endurance to safely mobilize in her home environment at discharge.    Follow Up Recommendations  No PT follow up     Equipment Recommendations  None recommended by PT    Recommendations for Other Services       Precautions / Restrictions Precautions Precautions: Fall Precaution Comments: provided back handout and reviewed precautions. pt does not have a back brace currently but reports having a brace at home Restrictions Weight Bearing Restrictions: No    Mobility  Bed Mobility Overal bed mobility: Needs Assistance Bed Mobility: Rolling Rolling: Supervision   Supine to sit: Supervision     General bed mobility comments: supervision for bed mobility from a flattened bed  Transfers Overall transfer level: Needs assistance Equipment used: Straight cane Transfers: Sit to/from Stand Sit to Stand: Supervision         General transfer comment: supervision for safety, able to use SPC for staedying in upright  Ambulation/Gait Ambulation/Gait assistance: Supervision Ambulation Distance (Feet): 500 Feet Assistive device: Straight cane Gait Pattern/deviations: Step-through pattern;Decreased stride length Gait velocity: decreased Gait velocity interpretation: Below normal speed for age/gender General Gait Details: slow, steady gait, no LoB    Stairs Stairs: Yes   Stair Management: One rail  Left;Forwards;Step to pattern Number of Stairs: 5 General stair comments: slow, steady ascent/descent supervision for safety      Balance Overall balance assessment: No apparent balance deficits (not formally assessed)                                          Cognition Arousal/Alertness: Awake/alert Behavior During Therapy: WFL for tasks assessed/performed Overall Cognitive Status: Within Functional Limits for tasks assessed                                           General Comments General comments (skin integrity, edema, etc.): reviewed precautions with pt and grandson      Pertinent Vitals/Pain Pain Assessment: No/denies pain    Home Living Family/patient expects to be discharged to:: Private residence Living Arrangements: Alone Available Help at Discharge: Family;Available 24 hours/day Type of Home: House Home Access: Stairs to enter Entrance Stairs-Rails: Right Home Layout: One level Home Equipment: Walker - 2 wheels;Kasandra Knudsen - single point Additional Comments: reports daughter is checking with friend to borrow 3n1. grandson present during evaluation    Prior Function Level of Independence: Independent with assistive device(s)      Comments: cane in community.    PT Goals (current goals can now be found in the care plan section) Acute Rehab PT Goals Patient Stated Goal: to get back home PT Goal Formulation: With patient Time For Goal Achievement: 07/08/17 Potential to Achieve Goals: Good Progress towards PT goals:  Progressing toward goals    Frequency    Min 5X/week      PT Plan Current plan remains appropriate       AM-PAC PT "6 Clicks" Daily Activity  Outcome Measure  Difficulty turning over in bed (including adjusting bedclothes, sheets and blankets)?: None Difficulty moving from lying on back to sitting on the side of the bed? : None Difficulty sitting down on and standing up from a chair with arms (e.g.,  wheelchair, bedside commode, etc,.)?: A Little Help needed moving to and from a bed to chair (including a wheelchair)?: A Little Help needed walking in hospital room?: A Little Help needed climbing 3-5 steps with a railing? : A Little 6 Click Score: 20    End of Session Equipment Utilized During Treatment: Gait belt Activity Tolerance: Patient tolerated treatment well Patient left: with family/visitor present;with call bell/phone within reach;Other (comment) (seated EoB) Nurse Communication: Mobility status PT Visit Diagnosis: Unsteadiness on feet (R26.81);Muscle weakness (generalized) (M62.81);Pain;Difficulty in walking, not elsewhere classified (R26.2)     Time: 1761-6073 PT Time Calculation (min) (ACUTE ONLY): 11 min  Charges:  $Gait Training: 8-22 mins                    G Codes:  Functional Assessment Tool Used: AM-PAC 6 Clicks Basic Mobility;Clinical judgement Functional Limitation: Mobility: Walking and moving around Mobility: Walking and Moving Around Current Status (X1062): At least 20 percent but less than 40 percent impaired, limited or restricted Mobility: Walking and Moving Around Goal Status [phone]): At least 1 percent but less than 20 percent impaired, limited or restricted    Benjamine Mola B. Migdalia Dk PT, DPT Acute Rehabilitation  [phone] Pager [phone]     [place] 06/25/2017, 11:47 AM

## 2017-06-25 NOTE — Progress Notes (Signed)
Pt doing well. Pt and grandson given D/C instructions with Rx's, verbal understanding was provided. Pt's IV was removed prior to D/C. Pt's incision is clean and dry with no sign of infection. Pt D/C'd home via wheelchair @ 1130 per MD order. Pt is stable @ D/C and has no other needs at this time. Holli Humbles, RN

## 2017-06-25 NOTE — Progress Notes (Signed)
Late Entry G-Codes for Evaluation on 07-10-17    07-10-17 1638  PT G-Codes **NOT FOR INPATIENT CLASS**  Functional Assessment Tool Used AM-PAC 6 Clicks Basic Mobility;Clinical judgement  Functional Limitation Mobility: Walking and moving around  Mobility: Walking and Moving Around Current Status [phone]) CK  Mobility: Walking and Moving Around Goal Status (F7902) CI   Scheryl Marten PT, DPT  [phone]

## 2017-06-25 NOTE — Progress Notes (Signed)
    Subjective: Procedure(s) (LRB): KYPHOPLASTY T10 (N/A) 1 Day Post-Op  Patient reports pain as 2 on 0-10 scale.  Reports decreased leg pain reports incisional back pain   Positive void Negative bowel movement Positive flatus Negative chest pain or shortness of breath  Objective: Vital signs in last 24 hours: Temp:  [98 F (36.7 C)-98.6 F (37 C)] 98 F (36.7 C) (07/06 0344) Pulse Rate:  [57-81] 76 (07/06 0344) Resp:  [11-18] 18 (07/06 0344) BP: (101-149)/(63-79) 119/65 (07/06 0344) SpO2:  [97 %-100 %] 97 % (07/06 0344) Weight:  [53.5 kg (118 lb)] 53.5 kg (118 lb) (07/05 0827)  Intake/Output from previous day: 07/05 0701 - 07/06 0700 In: 840 [P.O.:240; I.V.:500] Out: 1 [Blood:1]  Labs:  Recent Labs  06/24/17 0830  HCT 39.0    Recent Labs  06/24/17 0830  NA 140  K 4.1  CL 104  BUN 17  CREATININE 0.80  GLUCOSE 111*   No results for input(s): LABPT, INR in the last 72 hours.  Physical Exam: Neurologically intact ABD soft Intact pulses distally Incision: dressing C/D/I Compartment soft  Assessment/Plan: Patient stable  xrays n/a Continue mobilization with physical therapy Continue care  Advance diet Up with therapy  Doing well Pain improving Plan on d/c to home today  Melina Schools, MD [place] [phone]

## 2017-06-25 NOTE — Care Management Obs Status (Signed)
[facility] NOTIFICATION   Patient Details  Name: DORATHEA FAERBER MRN: [id number] Date of Birth: February 20, 1934   Medicare Observation Status Notification Given:  Yes    Ninfa Meeker, RN 06/25/2017, 11:05 AM

## 2017-06-29 ENCOUNTER — Telehealth: Payer: Self-pay | Admitting: Cardiovascular Disease

## 2017-06-29 NOTE — Telephone Encounter (Signed)
Patient was calling to see if she needed to take a higher dose of Metoprolol. Patient stated that her BP was up before her surgery and during heart cath, but now it's back to normal. Encouraged patient to keep a record of her BP and HR and follow-up with her PCP. Patient verbalized understanding.

## 2017-06-29 NOTE — Telephone Encounter (Signed)
Kim Lane is calling about a stress test and a heart cath and she is calling to find out if she is needing to take a blood thinner . Please call   Thanks

## 2017-06-30 ENCOUNTER — Other Ambulatory Visit: Payer: Self-pay

## 2017-06-30 MED ORDER — POLYSACCHARIDE IRON COMPLEX 150 MG PO CAPS: 150.0000 mg | ORAL_CAPSULE | Freq: Every day | ORAL | 1 refills | Status: DC

## 2017-07-02 ENCOUNTER — Ambulatory Visit (INDEPENDENT_AMBULATORY_CARE_PROVIDER_SITE_OTHER): Payer: Medicare Other | Admitting: Family Medicine

## 2017-07-02 ENCOUNTER — Encounter: Payer: Self-pay | Admitting: Family Medicine

## 2017-07-02 VITALS — BP 124/73 | HR 67 | Ht 64.0 in | Wt 117.7 lb

## 2017-07-02 DIAGNOSIS — Z9861 Coronary angioplasty status: Secondary | ICD-10-CM

## 2017-07-02 DIAGNOSIS — I2510 Atherosclerotic heart disease of native coronary artery without angina pectoris: Secondary | ICD-10-CM | POA: Insufficient documentation

## 2017-07-02 DIAGNOSIS — I10 Essential (primary) hypertension: Secondary | ICD-10-CM

## 2017-07-02 DIAGNOSIS — C8190 Hodgkin lymphoma, unspecified, unspecified site: Secondary | ICD-10-CM

## 2017-07-02 DIAGNOSIS — E7800 Pure hypercholesterolemia, unspecified: Secondary | ICD-10-CM

## 2017-07-02 DIAGNOSIS — M79672 Pain in left foot: Secondary | ICD-10-CM

## 2017-07-02 DIAGNOSIS — S99922A Unspecified injury of left foot, initial encounter: Secondary | ICD-10-CM

## 2017-07-02 NOTE — Telephone Encounter (Signed)
Fax sent to [facility] with confirmation from Dr. [name] that pt is cleared for surgery. Confirmed fax receipt.

## 2017-07-02 NOTE — Patient Instructions (Signed)
Please go get your foot x-rayed at [facility].  The order was sent.  They should be calling me with the results today.  Until we know further, please try weightbearing as tolerated, elevate it, ice it for 15 minutes every hour.  If this worsens and does not continue to slowly improve over time, please let us know as we may need to send you to an orthopedist.    Please follow up so we can continue to discuss your chronic ongoing issues in the next couple of months.  Follow-up sooner if your foot pain doesn't slowly improve over the next 2-3 weeks.    Foot Injury A foot sprain is an injury to one of the strong bands of tissue (ligaments) that connect and support the many bones in your feet. The ligament can be stretched too much or it can tear. A tear can be either partial or complete. The severity of the sprain depends on how much of the ligament was damaged or torn. What are the causes? A foot sprain is usually caused by suddenly twisting or pivoting your foot. What increases the risk? This injury is more likely to occur in people who:  Play a sport, such as basketball or football.  Exercise or play a sport without warming up.  Start a new workout or sport.  Suddenly increase how long or hard they exercise or play a sport.  What are the signs or symptoms? Symptoms of this condition start soon after an injury and include:  Pain, especially in the arch of the foot.  Bruising.  Swelling.  Inability to walk or use the foot to support body weight.  How is this diagnosed? This condition is diagnosed with a medical history and physical exam. You may also have imaging tests, such as:  X-rays to make sure there are no broken bones (fractures).  MRI to see if the ligament has torn.  How is this treated? Treatment varies depending on the severity of your sprain. Mild sprains can be treated with rest, ice, compression, and elevation (RICE). If your ligament is overstretched or  partially torn, treatment usually involves keeping your foot in a fixed position (immobilization) for a period of time. To help you do this, your health care provider will apply a bandage, splint, or walking boot to keep your foot from moving until it heals. You may also be advised to use crutches or a scooter for a few weeks to avoid bearing weight on your foot while it is healing. If your ligament is fully torn, you may need surgery to reconnect the ligament to the bone. After surgery, a cast or splint will be applied and will need to stay on your foot while it heals. Your health care provider may also suggest exercises or physical therapy to strengthen your foot. Follow these instructions at home: If You Have a Bandage, Splint, or Walking Boot:  Wear it as directed by your health care provider. Remove it only as directed by your health care provider.  Loosen the bandage, splint, or walking boot if your toes become numb and tingle, or if they turn cold and blue. Bathing  If your health care provider approves bathing and showering, cover the bandage or splint with a watertight plastic bag to protect it from water. Do not let the bandage or splint get wet. Managing pain, stiffness, and swelling  If directed, apply ice to the injured area: ? Put ice in a plastic bag. ? Place a towel between  your skin and the bag. ? Leave the ice on for 20 minutes, 2-3 times per day.  Move your toes often to avoid stiffness and to lessen swelling.  Raise (elevate) the injured area above the level of your heart while you are sitting or lying down. Driving  Do not drive or operate heavy machinery while taking pain medicine.  Ask your health care provider when it is safe to drive if you have a bandage, splint, or walking boot on your foot. Activity  Rest as directed by your health care provider.  Do not use the injured foot to support your body weight until your health care provider says that you can. Use  crutches or other supportive devices as directed by your health care provider.  Ask your health care provider what activities are safe for you. Gradually increase how much and how far you walk until your health care provider says it is safe to return to full activity.  Do any exercise or physical therapy as directed by your health care provider. General instructions  If a splint was applied, do not put pressure on any part of it until it is fully hardened. This may take several hours.  Take medicines only as directed by your health care provider. These include over-the-counter medicines and prescription medicines.  Keep all follow-up visits as directed by your health care provider. This is important.  When you can walk without pain, wear supportive shoes that have stiff soles. Do not wear flip-flops, and do not walk barefoot. Contact a health care provider if:  Your pain is not controlled with medicine.  Your bruising or swelling gets worse or does not get better with treatment.  Your splint or walking boot is damaged. Get help right away if:  You develop severe numbness or tingling in your foot.  Your foot turns blue, white, or gray, and it feels cold. This information is not intended to replace advice given to you by your health care provider. Make sure you discuss any questions you have with your health care provider. Document Released: 05/29/2002 Document Revised: 05/14/2016 Document Reviewed: 10/10/2014 Elsevier Interactive Patient Education  2018 [employer].

## 2017-07-02 NOTE — Progress Notes (Signed)
New patient office visit note:  Impression and Recommendations:    1. Foot pain, left   2. Injury, foot, left, initial encounter   3. CAD S/P percutaneous coronary angioplasty   4. Atherosclerosis of native coronary artery of native heart without angina pectoris      No problem-specific Assessment & Plan notes found for this encounter.   The patient was counseled, risk factors were discussed, anticipatory guidance given.   New Prescriptions   No medications on file     Discontinued Medications   No medications on file      Orders Placed This Encounter  Procedures  . DG Foot Complete Left     Gross side effects, risk and benefits, and alternatives of medications discussed with patient.  Patient is aware that all medications have potential side effects and we are unable to predict every side effect or drug-drug interaction that may occur.  Expresses verbal understanding and consents to current therapy plan and treatment regimen.  Return if symptoms worsen or fail to improve in 2-3 wks, for Also you need follow-up in a couple months to further discuss her chronic ongoing issues..  Please see AVS handed out to patient at the end of our visit for further patient instructions/ counseling done pertaining to today's office visit.    Note: This document was prepared using Dragon voice recognition software and may include unintentional dictation errors.  ----------------------------------------------------------------------------------------------------------------------    Subjective:    Chief complaint:   Chief Complaint  Patient presents with  . Establish Care     HPI: Kim Lane is a pleasant 83 y.o. female who presents to [place] [facility] today to review their medical history with me and establish care.   I asked the patient to review their chronic problem list with me to ensure everything was updated and accurate.    All  recent office visits with other providers, any medical records that patient brought in etc  - I reviewed today.     Also asked pt to get me medical records from [facility] providers/ specialists that they had seen within the past 3-5 years- if they are in private practice and/or do not work for a [employer], [facility], [place], [place] or [employer] owned practice.  Told them to call their specialists to clarify this if they are not sure.    Dr Clayton Bibles- female doc at [place] across from [facility].  Changing docs to be closer to home and also- daughter went with her to her past PCP-   Sees heart Doc once yrly or so   last May 2017- started not feeling well- Found to have Hodgkins lymphoma- chemo- sees Dr [name]- ONC. Gets PET scans regularly.  Last CHEMO was May 30th 2018.     Recetnly had Kyphoplasty  06/24/17 - by Dr Rolena Infante- [place] [facility]- gives her pain meds.  But prior to back sx---needed surgical clearance from cards--> had + stress and then cath  Yesterday while patient was walking to the mailbox around 2 PM in the afternoon she twisted her ankle when she walked into a little difficult in the ground and felt a pain in top lateral aspect ankle.  She did not have pain immediately and she was able to weight bear.  It got worse as the day went on and swelling increased.  She has been able to walk on it and has been trying to ice it.    [name] Kitchen  07/02/17 117 lb 11.2 oz (53.4 kg)  06/24/17 118 lb (53.5 kg)  06/16/17 118 lb (53.5 kg)   BP Readings from Last 3 Encounters:  07/02/17 124/73  06/25/17 128/65  06/16/17 119/61   Pulse Readings from Last 3 Encounters:  07/02/17 67  06/25/17 68  06/16/17 77   BMI Readings from Last 3 Encounters:  07/02/17 20.20 kg/m  06/24/17 20.25 kg/m  06/16/17 20.25 kg/m    Patient Care Team    Relationship Specialty Notifications Start End  [name], DO PCP - General Family Medicine  07/02/17   Josue Hector, MD Consulting Physician Cardiology  07/02/17     Penni Bombard, MD Consulting Physician Neurology  07/02/17   Jarome Matin, MD Consulting Physician Dermatology  07/02/17   Rozetta Nunnery, MD Consulting Physician Otolaryngology  07/02/17   Brunetta Genera, MD Consulting Physician Hematology  07/02/17   Melina Schools, MD Consulting Physician Orthopedic Surgery  07/02/17     Patient Active Problem List   Diagnosis Date Noted  . Hodgkin lymphoma ([place]) 04/26/2017    Priority: High  . Coronary atherosclerosis 01/08/2009    Priority: High  . Closed compression fracture of L2 lumbar vertebra ([place]) 06/24/2017    Priority: Medium  . HYPERCHOLESTEROLEMIA, MIXED 01/08/2009    Priority: Medium  . Hypothyroidism 01/08/2009    Priority: Medium  . CAD S/P percutaneous coronary angioplasty 07/02/2017  . Abnormal nuclear cardiac imaging test 06/16/2017  . Malnutrition of moderate degree 04/27/2017  . Symptomatic anemia 04/26/2017  . Port catheter in place 02/02/2017  . 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 Dyspnea 09/14/2012  . Epistaxis 06/02/2011  . Essential hypertension, benign 01/08/2009     Past Medical History:  Diagnosis Date  . Anemia   . Anginal pain ([place])    occ  . Blood dyscrasia 12/2016   hodgins lymphoma tx-remission  . CAD (coronary artery disease) 02/2006   Taxus stent placed to LAD and Diagonal per Dr. Olevia Perches  . Cataracts, bilateral   . Complication of anesthesia   . HTN (hypertension)   . Hypercholesterolemia   . Hypothyroidism   . IBS (irritable bowel syndrome)   . Osteoarthritis   . PONV (postoperative nausea and vomiting)   . Stroke [facility]) 03/2016     Past Medical History:  Diagnosis Date  . Anemia   . Anginal pain ([place])    occ  . Blood dyscrasia 12/2016   hodgins lymphoma tx-remission  . CAD (coronary artery disease) 02/2006   Taxus stent placed to LAD and Diagonal per Dr. Olevia Perches  . Cataracts, bilateral   . Complication of anesthesia   . HTN  (hypertension)   . Hypercholesterolemia   . Hypothyroidism   . IBS (irritable bowel syndrome)   . Osteoarthritis   . PONV (postoperative nausea and vomiting)   . Stroke [facility]) 03/2016     Past Surgical History:  Procedure Laterality Date  . APPENDECTOMY    . IR GENERIC HISTORICAL  01/25/2017   IR US GUIDE VASC ACCESS RIGHT 01/25/2017 [name], MD [facility]  . IR GENERIC HISTORICAL  01/25/2017   IR FLUORO GUIDE PORT INSERTION RIGHT 01/25/2017 [name], MD [facility]  . KYPHOPLASTY N/A 06/24/2017   Procedure: KYPHOPLASTY T10;  Surgeon: Melina Schools, MD;  Location: [facility];  Service: Orthopedics;  Laterality: N/A;  90 mins  . LEFT HEART CATH AND CORONARY ANGIOGRAPHY N/A 06/16/2017   Procedure: Left Heart Cath and  Coronary Angiography;  Surgeon: Sherren Mocha, MD;  Location: [facility];  Service: Cardiovascular;  Laterality: N/A;  . MASS EXCISION Right 12/22/2016   Procedure: RIGHT NECK LYMPH NODE BIOPSY;  Surgeon: Rozetta Nunnery, MD;  Location: [facility];  Service: ENT;  Laterality: Right;  . SPINE SURGERY    . stents     in heart  . TONSILLECTOMY    . TOTAL KNEE ARTHROPLASTY Bilateral 2011,2012  . VAGINAL HYSTERECTOMY       Family History  Problem Relation Age of Onset  . Diabetes Mother   . Dementia Mother   . ALS Mother   . Heart Problems Father   . Liver disease Sister   . Cirrhosis Sister   . Heart block Unknown        CABG  . Diabetes Sister   . Heart block Son        CABG  . Other [place]  . Other Daughter        [place]  . Dementia Sister      History  Drug Use No     History  Alcohol Use No     History  Smoking Status  . Former Smoker  Smokeless Tobacco  . Never Used    Comment: quit smoking 30 years ago, very light smoker     Outpatient Encounter Prescriptions as of 07/02/2017  Medication Sig Note  . aspirin 81 MG tablet Take 81 mg by mouth daily.   04/26/2017: Dose not take  everyday  . Coenzyme Q10 100 MG capsule Take 100 mg by mouth daily.    . iron polysaccharides (NIFEREX) 150 MG capsule Take 1 capsule (150 mg total) by mouth daily.   [name] Kitchen lactose free nutrition (BOOST PLUS) LIQD Take 237 mLs by mouth 3 (three) times daily with meals.   [name] Kitchen levothyroxine (SYNTHROID, LEVOTHROID) 88 MCG tablet Take 88 mcg by mouth daily before breakfast.    . metoprolol succinate (TOPROL-XL) 25 MG 24 hr tablet Take 12.5 mg by mouth every other day.  06/24/2017: Pt takes every other day-took 06/23/17  . Multiple Vitamins-Minerals (CENTRUM SILVER PO) Take 1 tablet by mouth daily.     . nitroGLYCERIN (NITROLINGUAL) 0.4 MG/SPRAY spray Place 1 spray under the tongue every 5 (five) minutes x 3 doses as needed for chest pain (3 sprays max).   . Omega-3 Fatty Acids (FISH OIL) 1000 MG CAPS Take 1 capsule by mouth daily.    . ondansetron (ZOFRAN ODT) 4 MG disintegrating tablet Take 1 tablet (4 mg total) by mouth every 8 (eight) hours as needed for nausea or vomiting.   [name] Kitchen oxyCODONE-acetaminophen (ROXICET) 5-325 MG tablet Take 1 tablet by mouth every 6 (six) hours as needed.   [name] Kitchen PROAIR HFA 108 (90 Base) MCG/ACT inhaler Inhale 2 puffs into the lungs every 4 (four) hours as needed for shortness of breath.    . RESTASIS 0.05 % ophthalmic emulsion Place 2 drops into both eyes 2 (two) times daily. For dry eyes   . traZODone (DESYREL) 50 MG tablet Take 0.5-1 tablets (25-50 mg total) by mouth at bedtime as needed for sleep. 04/26/2017: Took a half at 2 am when she couldn't go back to bed  . triamcinolone cream (KENALOG) 0.1 % Apply 1 application topically daily as needed.     No facility-administered encounter medications on file as of 07/02/2017.     Allergies: Fosamax [alendronate sodium]  ROS   Objective:   Blood pressure 124/73, pulse 67, height 5' 4" (1.626 m), weight 117 lb 11.2 oz (53.4 kg). Body mass index is 20.2 kg/m. General: Well Developed, well nourished, and in no acute distress.    Neuro: Alert and oriented x3, extra-ocular muscles intact, sensation grossly intact.  HEENT:[place]/AT, PERRLA, neck supple, No carotid bruits Skin: no gross rashes  Cardiac: Regular rate and rhythm Respiratory: Essentially clear to auscultation bilaterally. Not using accessory muscles, speaking in full sentences.  Abdominal: not grossly distended Musculoskeletal: Ambulates w/o diff, FROM * 4 ext.  Vasc: less 2 sec cap RF, warm and pink  Psych:  No HI/SI, judgement and insight good, Euthymic mood. Full Affect.    Recent Results (from the past 2160 hour(s))  CBC with Differential     Status: Abnormal   Collection Time: 04/06/17  8:28 AM  Result Value Ref Range   WBC 26.7 (H) 3.9 - 10.3 10e3/uL   NEUT# 23.3 (H) 1.5 - 6.5 10e3/uL   HGB 10.3 (L) 11.6 - 15.9 g/dL   HCT 30.9 (L) 34.8 - 46.6 %   Platelets 509 (H) 145 - 400 10e3/uL   MCV 83.0 79.5 - 101.0 fL   MCH 27.6 25.1 - 34.0 pg   MCHC 33.2 31.5 - 36.0 g/dL   RBC 3.73 3.70 - 5.45 10e6/uL   RDW 27.5 (H) 11.2 - 14.5 %   lymph# 1.1 0.9 - 3.3 10e3/uL   MONO# 1.2 (H) 0.1 - 0.9 10e3/uL   Eosinophils Absolute 0.8 (H) 0.0 - 0.5 10e3/uL   Basophils Absolute 0.2 (H) 0.0 - 0.1 10e3/uL   NEUT% 87.4 (H) 38.4 - 76.8 %   LYMPH% 4.3 (L) 14.0 - 49.7 %   MONO% 4.5 0.0 - 14.0 %   EOS% 3.1 0.0 - 7.0 %   BASO% 0.7 0.0 - 2.0 %  Comprehensive metabolic panel     Status: Abnormal   Collection Time: 04/06/17  8:28 AM  Result Value Ref Range   Sodium 141 136 - 145 mEq/L   Potassium 4.4 3.5 - 5.1 mEq/L   Chloride 105 98 - 109 mEq/L   CO2 25 22 - 29 mEq/L   Glucose 114 70 - 140 mg/dl    Comment: Glucose reference range is for nonfasting patients. Fasting glucose reference range is 70- 100.   BUN 13.7 7.0 - 26.0 mg/dL   Creatinine 0.8 0.6 - 1.1 mg/dL   Total Bilirubin 0.25 0.20 - 1.20 mg/dL   Alkaline Phosphatase 152 (H) 40 - 150 U/L   AST 24 5 - 34 U/L   ALT 20 0 - 55 U/L   Total Protein 6.6 6.4 - 8.3 g/dL   Albumin 3.7 3.5 - 5.0 g/dL   Calcium 9.4  8.4 - 10.4 mg/dL   Anion Gap 11 3 - 11 mEq/L   EGFR 72 (L) >90 ml/min/1.73 m2    Comment: eGFR is calculated using the CKD-EPI Creatinine Equation (2009)  Glucose, capillary     Status: Abnormal   Collection Time: 04/19/17  8:14 AM  Result Value Ref Range   Glucose-Capillary 117 (H) 65 - 99 mg/dL  CBC & Diff and Retic     Status: Abnormal   Collection Time: 04/20/17 10:38 AM  Result Value Ref Range   WBC 26.4 (H) 3.9 - 10.3 10e3/uL   NEUT# 22.8 (H) 1.5 - 6.5 10e3/uL   HGB 9.7 (L) 11.6 - 15.9 g/dL   HCT 30.4 (L) 34.8 - 46.6 %  Platelets 447 (H) 145 - 400 10e3/uL   MCV 87.6 79.5 - 101.0 fL   MCH 28.0 25.1 - 34.0 pg   MCHC 31.9 31.5 - 36.0 g/dL   RBC 3.47 (L) 3.70 - 5.45 10e6/uL   RDW 23.3 (H) 11.2 - 14.5 %   lymph# 1.0 0.9 - 3.3 10e3/uL   MONO# 1.7 (H) 0.1 - 0.9 10e3/uL   Eosinophils Absolute 0.6 (H) 0.0 - 0.5 10e3/uL   Basophils Absolute 0.2 (H) 0.0 - 0.1 10e3/uL   NEUT% 86.6 (H) 38.4 - 76.8 %   LYMPH% 3.9 (L) 14.0 - 49.7 %   MONO% 6.4 0.0 - 14.0 %   EOS% 2.4 0.0 - 7.0 %   BASO% 0.7 0.0 - 2.0 %   Retic % 3.26 (H) 0.70 - 2.10 %   Retic Ct Abs 113.12 (H) 33.70 - 90.70 10e3/uL   Immature Retic Fract 13.50 (H) 1.60 - 10.00 %  Sedimentation rate     Status: None   Collection Time: 04/20/17 10:39 AM  Result Value Ref Range   Sedimentation Rate-Westergren 31 0 - 40 mm/hr  Comprehensive metabolic panel     Status: Abnormal   Collection Time: 04/20/17 10:39 AM  Result Value Ref Range   Sodium 141 136 - 145 mEq/L   Potassium 4.1 3.5 - 5.1 mEq/L   Chloride 106 98 - 109 mEq/L   CO2 26 22 - 29 mEq/L   Glucose 104 70 - 140 mg/dl    Comment: Glucose reference range is for nonfasting patients. Fasting glucose reference range is 70- 100.   BUN 12.6 7.0 - 26.0 mg/dL   Creatinine 0.8 0.6 - 1.1 mg/dL   Total Bilirubin 0.24 0.20 - 1.20 mg/dL   Alkaline Phosphatase 143 40 - 150 U/L   AST 18 5 - 34 U/L   ALT 16 0 - 55 U/L   Total Protein 6.3 (L) 6.4 - 8.3 g/dL   Albumin 3.4 (L) 3.5 -  5.0 g/dL   Calcium 9.0 8.4 - 10.4 mg/dL   Anion Gap 9 3 - 11 mEq/L   EGFR 68 (L) >90 ml/min/1.73 m2    Comment: eGFR is calculated using the CKD-EPI Creatinine Equation (2009)  CBC with Differential     Status: Abnormal   Collection Time: 04/26/17  2:52 PM  Result Value Ref Range   WBC 9.2 3.9 - 10.3 10e3/uL   NEUT# 8.2 (H) 1.5 - 6.5 10e3/uL   HGB 6.6 (LL) 11.6 - 15.9 g/dL   HCT 20.8 (L) 34.8 - 46.6 %   Platelets 264 145 - 400 10e3/uL   MCV 89.3 79.5 - 101.0 fL   MCH 28.3 25.1 - 34.0 pg   MCHC 31.7 31.5 - 36.0 g/dL   RBC 2.33 (L) 3.70 - 5.45 10e6/uL   RDW 20.6 (H) 11.2 - 14.5 %   lymph# 0.5 (L) 0.9 - 3.3 10e3/uL   MONO# 0.2 0.1 - 0.9 10e3/uL   Eosinophils Absolute 0.3 0.0 - 0.5 10e3/uL   Basophils Absolute 0.0 0.0 - 0.1 10e3/uL   NEUT% 89.5 (H) 38.4 - 76.8 %   LYMPH% 5.1 (L) 14.0 - 49.7 %   MONO% 2.4 0.0 - 14.0 %   EOS% 2.7 0.0 - 7.0 %   BASO% 0.3 0.0 - 2.0 %  Comprehensive metabolic panel     Status: Abnormal   Collection Time: 04/26/17  2:52 PM  Result Value Ref Range   Sodium 140 136 - 145 mEq/L   Potassium 3.5 3.5 -  5.1 mEq/L   Chloride 106 98 - 109 mEq/L   CO2 28 22 - 29 mEq/L   Glucose 168 (H) 70 - 140 mg/dl    Comment: Glucose reference range is for nonfasting patients. Fasting glucose reference range is 70- 100.   BUN 17.0 7.0 - 26.0 mg/dL   Creatinine 0.7 0.6 - 1.1 mg/dL   Total Bilirubin 0.26 0.20 - 1.20 mg/dL   Alkaline Phosphatase 165 (H) 40 - 150 U/L   AST 15 5 - 34 U/L   ALT 19 0 - 55 U/L   Total Protein 5.5 (L) 6.4 - 8.3 g/dL   Albumin 3.3 (L) 3.5 - 5.0 g/dL   Calcium 8.4 8.4 - 10.4 mg/dL   Anion Gap 7 3 - 11 mEq/L   EGFR 77 (L) >90 ml/min/1.73 m2    Comment: eGFR is calculated using the CKD-EPI Creatinine Equation (2009)  Type and screen     Status: None   Collection Time: 04/26/17  4:14 PM  Result Value Ref Range   ABO/RH(D) O POS    Antibody Screen NEG    Sample Expiration 04/29/2017    Unit Number [id number]    Blood Component Type RED  CELLS,LR    Unit division 00    Status of Unit ISSUED,FINAL    Transfusion Status OK TO TRANSFUSE    Crossmatch Result Compatible    Unit Number [id number]    Blood Component Type RED CELLS,LR    Unit division 00    Status of Unit ISSUED,FINAL    Transfusion Status OK TO TRANSFUSE    Crossmatch Result Compatible   BPAM RBC     Status: None   Collection Time: 04/26/17  4:14 PM  Result Value Ref Range   ISSUE DATE / TIME [id number]    Blood Product Unit Number [id number]    PRODUCT CODE [id number]    Unit Type and Rh 5100    Blood Product Expiration Date 169678938101    ISSUE DATE / TIME 751025852778    Blood Product Unit Number [id number]    PRODUCT CODE Q0086P61    Unit Type and Rh 5100    Blood Product Expiration Date 950932671245   Reticulocytes     Status: Abnormal   Collection Time: 04/26/17  4:21 PM  Result Value Ref Range   Retic Ct Pct <0.4 (L) 0.4 - 3.1 %   RBC. 2.16 (L) 3.87 - 5.11 MIL/uL   Retic Count, Absolute NOT CALCULATED 19.0 - 186.0 K/uL  Magnesium     Status: None   Collection Time: 04/26/17  4:21 PM  Result Value Ref Range   Magnesium 1.8 1.7 - 2.4 mg/dL  I-stat troponin, ED     Status: Abnormal   Collection Time: 04/26/17  4:29 PM  Result Value Ref Range   Troponin i, poc 0.12 (HH) 0.00 - 0.08 ng/mL   Comment NOTIFIED PHYSICIAN    Comment 3            Comment: Due to the release kinetics of cTnI, a negative result within the first hours of the onset of symptoms does not rule out myocardial infarction with certainty. If myocardial infarction is still suspected, repeat the test at appropriate intervals.   I-stat chem 8, ed     Status: Abnormal   Collection Time: 04/26/17  4:30 PM  Result Value Ref Range   Sodium 138 135 - 145 mmol/L   Potassium 3.4 (L) 3.5 - 5.1 mmol/L   Chloride 103 101 - 111  mmol/L   BUN 16 6 - 20 mg/dL   Creatinine, Ser 0.70 0.44 - 1.00 mg/dL   Glucose, Bld 118 (H) 65 - 99 mg/dL   Calcium, Ion 1.04 (L) 1.15 - 1.40  mmol/L   TCO2 25 0 - 100 mmol/L   Hemoglobin 5.8 (LL) 12.0 - 15.0 g/dL   HCT 17.0 (L) 36.0 - 46.0 %   Comment NOTIFIED PHYSICIAN   Vitamin B12     Status: Abnormal   Collection Time: 04/26/17  5:13 PM  Result Value Ref Range   Vitamin B-12 3,385 (H) 180 - 914 pg/mL    Comment: (NOTE) This assay is not validated for testing neonatal or myeloproliferative syndrome specimens for Vitamin B12 levels. Performed at [facility], [place] [address] [place], [place] [address]   Folate     Status: None   Collection Time: 04/26/17  5:13 PM  Result Value Ref Range   Folate 20.2 >5.9 ng/mL    Comment: Performed at [facility] [address] [place], [place] [address]  Iron and TIBC     Status: Abnormal   Collection Time: 04/26/17  5:13 PM  Result Value Ref Range   Iron 12 (L) 28 - 170 ug/dL   TIBC 210 (L) 250 - 450 ug/dL   Saturation Ratios 6 (L) 10.4 - 31.8 %   UIBC 198 ug/dL    Comment: Performed at [facility], [place] [address] [place], [place] [address]  Ferritin     Status: Abnormal   Collection Time: 04/26/17  5:13 PM  Result Value Ref Range   Ferritin 397 (H) 11 - 307 ng/mL    Comment: Performed at [facility], [place] [address], [place], [place] [address]  Prepare RBC     Status: None   Collection Time: 04/26/17  5:13 PM  Result Value Ref Range   Order Confirmation ORDER PROCESSED BY BLOOD BANK   Haptoglobin     Status: None   Collection Time: 04/26/17  5:13 PM  Result Value Ref Range   Haptoglobin 132 34 - 200 mg/dL    Comment: (NOTE) Performed At: [facility] [place], [place] [id number] Lindon Romp MD [id number]   CBC     Status: Abnormal   Collection Time: 04/27/17  5:08 AM  Result Value Ref Range   WBC 6.3 4.0 - 10.5 K/uL   RBC 3.08 (L) 3.87 - 5.11 MIL/uL   Hemoglobin 9.1 (L) 12.0 - 15.0 g/dL    Comment: POST TRANSFUSION SPECIMEN DELTA CHECK NOTED    HCT 27.3 (L) 36.0 - 46.0 %   MCV 88.6 78.0 - 100.0 fL    MCH 29.5 26.0 - 34.0 pg   MCHC 33.3 30.0 - 36.0 g/dL   RDW 17.1 (H) 11.5 - 15.5 %   Platelets 258 150 - 400 K/uL  CBC with Differential     Status: Abnormal   Collection Time: 05/04/17 11:31 AM  Result Value Ref Range   WBC 25.0 (H) 3.9 - 10.3 10e3/uL   NEUT# 22.1 (H) 1.5 - 6.5 10e3/uL   HGB 11.1 (L) 11.6 - 15.9 g/dL   HCT 34.3 (L) 34.8 - 46.6 %   Platelets 492 (H) 145 - 400 10e3/uL   MCV 89.6 79.5 - 101.0 fL   MCH 29.0 25.1 - 34.0 pg   MCHC 32.4 31.5 - 36.0 g/dL   RBC 3.83 3.70 - 5.45 10e6/uL   RDW 16.5 (H) 11.2 -  14.5 %   lymph# 0.8 (L) 0.9 - 3.3 10e3/uL   MONO# 1.2 (H) 0.1 - 0.9 10e3/uL   Eosinophils Absolute 0.8 (H) 0.0 - 0.5 10e3/uL   Basophils Absolute 0.1 0.0 - 0.1 10e3/uL   NEUT% 88.2 (H) 38.4 - 76.8 %   LYMPH% 3.4 (L) 14.0 - 49.7 %   MONO% 4.8 0.0 - 14.0 %   EOS% 3.1 0.0 - 7.0 %   BASO% 0.5 0.0 - 2.0 %  Comprehensive metabolic panel     Status: Abnormal   Collection Time: 05/04/17 11:31 AM  Result Value Ref Range   Sodium 140 136 - 145 mEq/L   Potassium 4.2 3.5 - 5.1 mEq/L   Chloride 105 98 - 109 mEq/L   CO2 26 22 - 29 mEq/L   Glucose 89 70 - 140 mg/dl    Comment: Glucose reference range is for nonfasting patients. Fasting glucose reference range is 70- 100.   BUN 13.4 7.0 - 26.0 mg/dL   Creatinine 0.7 0.6 - 1.1 mg/dL   Total Bilirubin 0.31 0.20 - 1.20 mg/dL   Alkaline Phosphatase 144 40 - 150 U/L   AST 20 5 - 34 U/L   ALT 18 0 - 55 U/L   Total Protein 6.1 (L) 6.4 - 8.3 g/dL   Albumin 3.4 (L) 3.5 - 5.0 g/dL   Calcium 9.1 8.4 - 10.4 mg/dL   Anion Gap 10 3 - 11 mEq/L   EGFR 77 (L) >90 ml/min/1.73 m2    Comment: eGFR is calculated using the CKD-EPI Creatinine Equation (2009)  CBC with Differential     Status: Abnormal   Collection Time: 05/19/17 11:03 AM  Result Value Ref Range   WBC 22.7 (H) 3.9 - 10.3 10e3/uL   NEUT# 20.1 (H) 1.5 - 6.5 10e3/uL   HGB 10.5 (L) 11.6 - 15.9 g/dL   HCT 32.6 (L) 34.8 - 46.6 %   Platelets 458 (H) 145 - 400 10e3/uL   MCV 90.5  79.5 - 101.0 fL   MCH 29.1 25.1 - 34.0 pg   MCHC 32.1 31.5 - 36.0 g/dL   RBC 3.60 (L) 3.70 - 5.45 10e6/uL   RDW 15.5 (H) 11.2 - 14.5 %   lymph# 1.0 0.9 - 3.3 10e3/uL   MONO# 1.0 (H) 0.1 - 0.9 10e3/uL   Eosinophils Absolute 0.5 0.0 - 0.5 10e3/uL   Basophils Absolute 0.1 0.0 - 0.1 10e3/uL   NEUT% 88.5 (H) 38.4 - 76.8 %   LYMPH% 4.4 (L) 14.0 - 49.7 %   MONO% 4.5 0.0 - 14.0 %   EOS% 2.1 0.0 - 7.0 %   BASO% 0.5 0.0 - 2.0 %  Comprehensive metabolic panel     Status: Abnormal   Collection Time: 05/19/17 11:03 AM  Result Value Ref Range   Sodium 142 136 - 145 mEq/L   Potassium 4.1 3.5 - 5.1 mEq/L   Chloride 106 98 - 109 mEq/L   CO2 28 22 - 29 mEq/L   Glucose 93 70 - 140 mg/dl    Comment: Glucose reference range is for nonfasting patients. Fasting glucose reference range is 70- 100.   BUN 13.3 7.0 - 26.0 mg/dL   Creatinine 0.7 0.6 - 1.1 mg/dL   Total Bilirubin 0.24 0.20 - 1.20 mg/dL   Alkaline Phosphatase 136 40 - 150 U/L   AST 19 5 - 34 U/L   ALT 16 0 - 55 U/L   Total Protein 5.8 (L) 6.4 - 8.3 g/dL   Albumin 3.3 (L)  3.5 - 5.0 g/dL   Calcium 9.3 8.4 - 10.4 mg/dL   Anion Gap 8 3 - 11 mEq/L   EGFR 77 (L) >90 ml/min/1.73 m2    Comment: eGFR is calculated using the CKD-EPI Creatinine Equation (2009)  Urinalysis, Routine w reflex microscopic- may I&O cath if menses     Status: Abnormal   Collection Time: 05/22/17 11:01 AM  Result Value Ref Range   Color, Urine STRAW (A) YELLOW   APPearance CLEAR CLEAR   Specific Gravity, Urine 1.003 (L) 1.005 - 1.030   pH 6.0 5.0 - 8.0   Glucose, UA NEGATIVE NEGATIVE mg/dL   Hgb urine dipstick NEGATIVE NEGATIVE   Bilirubin Urine NEGATIVE NEGATIVE   Ketones, ur NEGATIVE NEGATIVE mg/dL   Protein, ur NEGATIVE NEGATIVE mg/dL   Nitrite NEGATIVE NEGATIVE   Leukocytes, UA NEGATIVE NEGATIVE  CBC with Differential     Status: Abnormal   Collection Time: 05/22/17 11:26 AM  Result Value Ref Range   WBC 11.8 (H) 4.0 - 10.5 K/uL   RBC 3.29 (L) 3.87 - 5.11  MIL/uL   Hemoglobin 9.8 (L) 12.0 - 15.0 g/dL   HCT 29.8 (L) 36.0 - 46.0 %   MCV 90.6 78.0 - 100.0 fL   MCH 29.8 26.0 - 34.0 pg   MCHC 32.9 30.0 - 36.0 g/dL   RDW 15.3 11.5 - 15.5 %   Platelets 287 150 - 400 K/uL   Neutrophils Relative % 89 %   Neutro Abs 10.6 (H) 1.7 - 7.7 K/uL   Lymphocytes Relative 8 %   Lymphs Abs 0.9 0.7 - 4.0 K/uL   Monocytes Relative 3 %   Monocytes Absolute 0.3 0.1 - 1.0 K/uL   Eosinophils Relative 0 %   Eosinophils Absolute 0.0 0.0 - 0.7 K/uL   Basophils Relative 0 %   Basophils Absolute 0.0 0.0 - 0.1 K/uL  Comprehensive metabolic panel     Status: Abnormal   Collection Time: 05/22/17 11:26 AM  Result Value Ref Range   Sodium 140 135 - 145 mmol/L   Potassium 3.8 3.5 - 5.1 mmol/L   Chloride 106 101 - 111 mmol/L   CO2 28 22 - 32 mmol/L   Glucose, Bld 90 65 - 99 mg/dL   BUN 24 (H) 6 - 20 mg/dL   Creatinine, Ser 0.69 0.44 - 1.00 mg/dL   Calcium 8.9 8.9 - 10.3 mg/dL   Total Protein 6.0 (L) 6.5 - 8.1 g/dL   Albumin 3.5 3.5 - 5.0 g/dL   AST 21 15 - 41 U/L   ALT 18 14 - 54 U/L   Alkaline Phosphatase 97 38 - 126 U/L   Total Bilirubin 0.5 0.3 - 1.2 mg/dL   GFR calc non Af Amer >60 >60 mL/min   GFR calc Af Amer >60 >60 mL/min    Comment: (NOTE) The eGFR has been calculated using the CKD EPI equation. This calculation has not been validated in all clinical situations. eGFR's persistently <60 mL/min signify possible Chronic Kidney Disease.    Anion gap 6 5 - 15  I-Stat CG4 Lactic Acid, ED     Status: None   Collection Time: 05/22/17 11:39 AM  Result Value Ref Range   Lactic Acid, Venous 1.01 0.5 - 1.9 mmol/L  Myocardial Perfusion Imaging     Status: None   Collection Time: 06/10/17  1:25 PM  Result Value Ref Range   Rest HR 65 bpm   Rest BP 122/69 mmHg   Peak HR 99 bpm  Peak BP 163/93 mmHg   SSS 13    SRS 6    SDS 7    LHR 0.30    TID 1.07    LV sys vol 28 mL   LV dias vol 67 46 - 106 mL  CBC with Differential/Platelet     Status: Abnormal    Collection Time: 06/14/17  8:58 AM  Result Value Ref Range   WBC 11.1 (H) 3.4 - 10.8 x10E3/uL   RBC 4.08 3.77 - 5.28 x10E6/uL   Hemoglobin 11.9 11.1 - 15.9 g/dL   Hematocrit 37.4 34.0 - 46.6 %   MCV 92 79 - 97 fL   MCH 29.2 26.6 - 33.0 pg   MCHC 31.8 31.5 - 35.7 g/dL   RDW 14.8 12.3 - 15.4 %   Platelets 496 (H) 150 - 379 x10E3/uL   Neutrophils 66 Not Estab. %   Lymphs 15 Not Estab. %   Monocytes 10 Not Estab. %   Eos 6 Not Estab. %   Basos 2 Not Estab. %   Neutrophils Absolute 7.3 (H) 1.4 - 7.0 x10E3/uL   Lymphocytes Absolute 1.7 0.7 - 3.1 x10E3/uL   Monocytes Absolute 1.1 (H) 0.1 - 0.9 x10E3/uL   EOS (ABSOLUTE) 0.7 (H) 0.0 - 0.4 x10E3/uL   Basophils Absolute 0.3 (H) 0.0 - 0.2 x10E3/uL   Immature Granulocytes 1 Not Estab. %   Immature Grans (Abs) 0.1 0.0 - 0.1 W26V7/CH  Basic metabolic panel     Status: None   Collection Time: 06/14/17  8:58 AM  Result Value Ref Range   Glucose 93 65 - 99 mg/dL   BUN 12 8 - 27 mg/dL   Creatinine, Ser 0.86 0.57 - 1.00 mg/dL   GFR calc non Af Amer 64 >59 mL/min/1.73   GFR calc Af Amer 73 >59 mL/min/1.73   BUN/Creatinine Ratio 14 12 - 28   Sodium 142 134 - 144 mmol/L   Potassium 4.9 3.5 - 5.2 mmol/L   Chloride 103 96 - 106 mmol/L   CO2 25 20 - 29 mmol/L    Comment:               **Please note reference interval change**   Calcium 9.5 8.7 - 10.3 mg/dL  Protime-INR     Status: None   Collection Time: 06/14/17  8:58 AM  Result Value Ref Range   INR 1.0 0.8 - 1.2    Comment: Reference interval is for non-anticoagulated patients. Suggested INR therapeutic range for Vitamin K antagonist therapy:    Standard Dose (moderate intensity                   therapeutic range):       2.0 - 3.0    Higher intensity therapeutic range       2.5 - 3.5    Prothrombin Time 10.4 9.1 - 12.0 sec  I-STAT, chem 8     Status: Abnormal   Collection Time: 06/24/17  8:30 AM  Result Value Ref Range   Sodium 140 135 - 145 mmol/L   Potassium 4.1 3.5 - 5.1 mmol/L    Chloride 104 101 - 111 mmol/L   BUN 17 6 - 20 mg/dL   Creatinine, Ser 0.80 0.44 - 1.00 mg/dL   Glucose, Bld 111 (H) 65 - 99 mg/dL   Calcium, Ion 1.12 (L) 1.15 - 1.40 mmol/L   TCO2 27 0 - 100 mmol/L   Hemoglobin 13.3 12.0 - 15.0 g/dL   HCT 39.0 36.0 -  46.0 %

## 2017-07-05 ENCOUNTER — Encounter (HOSPITAL_COMMUNITY)
Admission: RE | Admit: 2017-07-05 | Discharge: 2017-07-05 | Disposition: A | Discharge status: Home / Self Care | Payer: Medicare Other | Source: Ambulatory Visit | Attending: Hematology | Admitting: Hematology

## 2017-07-05 DIAGNOSIS — C8111 Nodular sclerosis classical Hodgkin lymphoma, lymph nodes of head, face, and neck: Secondary | ICD-10-CM | POA: Diagnosis present

## 2017-07-05 LAB — GLUCOSE, CAPILLARY: Glucose-Capillary: 110 mg/dL — ABNORMAL HIGH (ref 65–99)

## 2017-07-05 MED ORDER — FLUDEOXYGLUCOSE F - 18 (FDG) INJECTION
5.8700 | Freq: Once | INTRAVENOUS | Status: AC | PRN
  Administered 2017-07-05: 5.87 via INTRAVENOUS

## 2017-07-07 ENCOUNTER — Other Ambulatory Visit (HOSPITAL_BASED_OUTPATIENT_CLINIC_OR_DEPARTMENT_OTHER): Payer: Medicare Other

## 2017-07-07 ENCOUNTER — Ambulatory Visit (HOSPITAL_BASED_OUTPATIENT_CLINIC_OR_DEPARTMENT_OTHER): Payer: Medicare Other | Admitting: Hematology

## 2017-07-07 ENCOUNTER — Encounter: Payer: Self-pay | Admitting: Hematology

## 2017-07-07 VITALS — BP 157/90 | HR 73 | Temp 97.9°F | Resp 18 | Ht 64.0 in | Wt 119.5 lb

## 2017-07-07 DIAGNOSIS — L299 Pruritus, unspecified: Secondary | ICD-10-CM | POA: Diagnosis not present

## 2017-07-07 DIAGNOSIS — D509 Iron deficiency anemia, unspecified: Secondary | ICD-10-CM | POA: Diagnosis not present

## 2017-07-07 DIAGNOSIS — M4854XA Collapsed vertebra, not elsewhere classified, thoracic region, initial encounter for fracture: Secondary | ICD-10-CM | POA: Diagnosis not present

## 2017-07-07 DIAGNOSIS — C8111 Nodular sclerosis classical Hodgkin lymphoma, lymph nodes of head, face, and neck: Secondary | ICD-10-CM

## 2017-07-07 DIAGNOSIS — R21 Rash and other nonspecific skin eruption: Secondary | ICD-10-CM

## 2017-07-07 DIAGNOSIS — C8198 Hodgkin lymphoma, unspecified, lymph nodes of multiple sites: Secondary | ICD-10-CM

## 2017-07-07 LAB — COMPREHENSIVE METABOLIC PANEL
ALT: 17 U/L (ref 0–55)
AST: 23 U/L (ref 5–34)
Albumin: 4.1 g/dL (ref 3.5–5.0)
Alkaline Phosphatase: 106 U/L (ref 40–150)
Anion Gap: 11 mEq/L (ref 3–11)
BUN: 15.6 mg/dL (ref 7.0–26.0)
CO2: 27 mEq/L (ref 22–29)
Calcium: 9.9 mg/dL (ref 8.4–10.4)
Chloride: 104 mEq/L (ref 98–109)
Creatinine: 0.9 mg/dL (ref 0.6–1.1)
EGFR: 60 mL/min/{1.73_m2} — ABNORMAL LOW (ref >90)
Glucose: 121 mg/dl (ref 70–140)
Potassium: 5 mEq/L (ref 3.5–5.1)
Sodium: 142 mEq/L (ref 136–145)
Total Bilirubin: 0.43 mg/dL (ref 0.20–1.20)
Total Protein: 7.5 g/dL (ref 6.4–8.3)

## 2017-07-07 LAB — CBC & DIFF AND RETIC
BASO%: 1.6 % (ref 0.0–2.0)
Basophils Absolute: 0.1 10*3/uL (ref 0.0–0.1)
EOS%: 11.9 % — ABNORMAL HIGH (ref 0.0–7.0)
Eosinophils Absolute: 0.9 10*3/uL — ABNORMAL HIGH (ref 0.0–0.5)
HCT: 37.8 % (ref 34.8–46.6)
HGB: 12.1 g/dL (ref 11.6–15.9)
Immature Retic Fract: 3.1 % (ref 1.60–10.00)
LYMPH%: 19.1 % (ref 14.0–49.7)
MCH: 29.3 pg (ref 25.1–34.0)
MCHC: 32 g/dL (ref 31.5–36.0)
MCV: 91.5 fL (ref 79.5–101.0)
MONO#: 0.5 10*3/uL (ref 0.1–0.9)
MONO%: 6.7 % (ref 0.0–14.0)
NEUT#: 4.4 10*3/uL (ref 1.5–6.5)
NEUT%: 60.7 % (ref 38.4–76.8)
Platelets: 396 10*3/uL (ref 145–400)
RBC: 4.13 10*6/uL (ref 3.70–5.45)
RDW: 14.8 % — ABNORMAL HIGH (ref 11.2–14.5)
Retic %: 0.79 % (ref 0.70–2.10)
Retic Ct Abs: 32.63 10*3/uL — ABNORMAL LOW (ref 33.70–90.70)
WBC: 7.3 10*3/uL (ref 3.9–10.3)
lymph#: 1.4 10*3/uL (ref 0.9–3.3)

## 2017-07-07 NOTE — Patient Instructions (Signed)
Thank you for choosing [facility] to provide your oncology and hematology care.  To afford each patient quality time with our providers, please arrive 30 minutes before your scheduled appointment time.  If you arrive late for your appointment, you may be asked to reschedule.  We strive to give you quality time with our providers, and arriving late affects you and other patients whose appointments are after yours.   If you are a no show for multiple scheduled visits, you may be dismissed from the clinic at the providers discretion.    Again, thank you for choosing [facility], our hope is that these requests will decrease the amount of time that you wait before being seen by our physicians.  ______________________________________________________________________  Should you have questions after your visit to the [facility], please contact our office at [phone] between the hours of 8:30 and 4:30 p.m.    Voicemails left after 4:30p.m will not be returned until the following business day.    For prescription refill requests, please have your pharmacy contact us directly.  Please also try to allow 48 hours for prescription requests.    Please contact the scheduling department for questions regarding scheduling.  For scheduling of procedures such as PET scans, CT scans, MRI, Ultrasound, etc please contact central scheduling at [phone].    Resources For Cancer Patients and Caregivers:   Oncolink.org:  A wonderful resource for patients and healthcare providers for information regarding your disease, ways to tract your treatment, what to expect, etc.     American Cancer Society:  800-227-2345  Can help patients locate various types of support and financial assistance  Cancer Care: 1-800-813-HOPE (4673) Provides financial assistance, online support groups, medication/co-pay assistance.    [place] DSS:  336-641-3447 Where to apply for food  stamps, Medicaid, and utility assistance  Medicare Rights Center: 800-333-4114 Helps people with Medicare understand their rights and benefits, navigate the Medicare system, and secure the quality healthcare they deserve  SCAT: 336-333-6589 [place] [employer]'s shared-ride transportation service for eligible riders who have a disability that prevents them from riding the fixed route bus.    For additional information on assistance programs please contact our social worker:   Grier Hock/Abigail Elmore:  [phone]            

## 2017-07-08 LAB — SEDIMENTATION RATE: Sedimentation Rate-Westergren: 8 mm/hr (ref 0–40)

## 2017-07-08 NOTE — Progress Notes (Signed)
[address]    HEMATOLOGY/ONCOLOGY CLINIC NOTE  Date of Service: .07/07/2017  Patient Care Team: [name], DO as PCP - General (Family Medicine) Josue Hector, MD as Consulting Physician (Cardiology) Penni Bombard, MD as Consulting Physician (Neurology) Jarome Matin, MD as Consulting Physician (Dermatology) Rozetta Nunnery, MD as Consulting Physician (Otolaryngology) Brunetta Genera, MD as Consulting Physician (Hematology) Melina Schools, MD as Consulting Physician (Orthopedic Surgery)  CHIEF COMPLAINTS/PURPOSE OF CONSULTATION:   f/u for continued treatment of Hodgkin's lymphoma  HISTORY OF PRESENTING ILLNESS:   plz see previous   INTERVAL HISTORY  Patient is here for f/u after completion of her planned AVD treatment for her Hodgkin's lymphoma and to review post treatment PET CT scan which was done on 07/05/2017. She notes that her treatment-related fatigue and gait unsteadiness is improved. She after having a cardiac evaluation and clinic catheterization showing severe triple-vessel disease underwent a T10 kyphoplasty which has significantly improved her back pain though the delay in the procedure seems to have limited optimal benefits of the procedure to some extent. She is able to control her pain with a couple of Percocets a day and has been ambulating with a cane. She had tripped over a ditch and had some left foot discomfort and some left elbow injury which is resolving. No fevers no chills no night sweats or weight loss. She notes that she is eating much better. Overall feels well. PET/CT scan shows no signs of disease progression and stable disease. We discussed her goals of care and guidelines regarding imaging follow-up in the presence of some borderline disease which is unclear if it is active. She notes that she is old and has multiple medical issues and would not want routine imaging at this time and would like to be followed clinically. We discussed  that we will plan to see her back in 3 months with labs and for clinical evaluation. She has chosen to have the port in until her 3 month follow-up and then we'll decide whether she would want to take this out in the absence of any other signs of progression.  MEDICAL HISTORY:  Past Medical History:  Diagnosis Date  . Anemia   . Anginal pain ([place])    occ  . Blood dyscrasia 12/2016   hodgins lymphoma tx-remission  . CAD (coronary artery disease) 02/2006   Taxus stent placed to LAD and Diagonal per Dr. Olevia Perches  . Cataracts, bilateral   . Complication of anesthesia   . HTN (hypertension)   . Hypercholesterolemia   . Hypothyroidism   . IBS (irritable bowel syndrome)   . Osteoarthritis   . PONV (postoperative nausea and vomiting)   . Stroke [facility]) 03/2016  Osteoporosis Cardiologist Dr. [name] -Dr. Thana Farr  SURGICAL HISTORY: Past Surgical History:  Procedure Laterality Date  . APPENDECTOMY    . IR GENERIC HISTORICAL  01/25/2017   IR US GUIDE VASC ACCESS RIGHT 01/25/2017 [name], MD [facility]  . IR GENERIC HISTORICAL  01/25/2017   IR FLUORO GUIDE PORT INSERTION RIGHT 01/25/2017 [name], MD [facility]  . KYPHOPLASTY N/A 06/24/2017   Procedure: KYPHOPLASTY T10;  Surgeon: Melina Schools, MD;  Location: [place];  Service: Orthopedics;  Laterality: N/A;  90 mins  . LEFT HEART CATH AND CORONARY ANGIOGRAPHY N/A 06/16/2017   Procedure: Left Heart Cath and Coronary Angiography;  Surgeon: Sherren Mocha, MD;  Location: [place] [facility];  Service: Cardiovascular;  Laterality: N/A;  . MASS EXCISION Right 12/22/2016  Procedure: RIGHT NECK LYMPH NODE BIOPSY;  Surgeon: Rozetta Nunnery, MD;  Location: [place];  Service: ENT;  Laterality: Right;  . SPINE SURGERY    . stents     in heart  . TONSILLECTOMY    . TOTAL KNEE ARTHROPLASTY Bilateral 2011,2012  . VAGINAL HYSTERECTOMY      SOCIAL HISTORY: Social History   Social History  . Marital status:  Widowed    Spouse name: N/A  . Number of children: 2  . Years of education: 12   Occupational History  .  [employer]   Social History Main Topics  . Smoking status: Former Research scientist (life sciences)  . Smokeless tobacco: Never Used     Comment: quit smoking 30 years ago, very light smoker  . Alcohol use No  . Drug use: No  . Sexual activity: Not on file   Other Topics Concern  . Not on file   Social History Narrative   Lives alone   caffeine use- coffee- 5 cups daily    FAMILY HISTORY: Family History  Problem Relation Age of Onset  . Diabetes Mother   . Dementia Mother   . ALS Mother   . Heart Problems Father   . Liver disease Sister   . Cirrhosis Sister   . Heart block Unknown        CABG  . Diabetes Sister   . Heart block Son        CABG  . Other [place]  . Other Daughter        [place]  . Dementia Sister     ALLERGIES:  is allergic to fosamax [alendronate sodium].  MEDICATIONS:  Current Outpatient Prescriptions  Medication Sig Dispense Refill  . aspirin 81 MG tablet Take 81 mg by mouth daily.      . Coenzyme Q10 100 MG capsule Take 100 mg by mouth daily.     . iron polysaccharides (NIFEREX) 150 MG capsule Take 1 capsule (150 mg total) by mouth daily. 30 capsule 1  . lactose free nutrition (BOOST PLUS) LIQD Take 237 mLs by mouth 3 (three) times daily with meals. 30 Can 0  . levothyroxine (SYNTHROID, LEVOTHROID) 88 MCG tablet Take 88 mcg by mouth daily before breakfast.     . metoprolol succinate (TOPROL-XL) 25 MG 24 hr tablet Take 12.5 mg by mouth every other day.     . Multiple Vitamins-Minerals (CENTRUM SILVER PO) Take 1 tablet by mouth daily.      . nitroGLYCERIN (NITROLINGUAL) 0.4 MG/SPRAY spray Place 1 spray under the tongue every 5 (five) minutes x 3 doses as needed for chest pain (3 sprays max).    . Omega-3 Fatty Acids (FISH OIL) 1000 MG CAPS Take 1 capsule by mouth daily.     . ondansetron (ZOFRAN ODT) 4 MG  disintegrating tablet Take 1 tablet (4 mg total) by mouth every 8 (eight) hours as needed for nausea or vomiting. 20 tablet 0  . oxyCODONE-acetaminophen (ROXICET) 5-325 MG tablet Take 1 tablet by mouth every 6 (six) hours as needed. 28 tablet 0  . PROAIR HFA 108 (90 Base) MCG/ACT inhaler Inhale 2 puffs into the lungs every 4 (four) hours as needed for shortness of breath.     . RESTASIS 0.05 % ophthalmic emulsion Place 2 drops into both eyes 2 (two) times daily. For dry eyes    . traZODone (DESYREL) 50 MG tablet Take  0.5-1 tablets (25-50 mg total) by mouth at bedtime as needed for sleep. 30 tablet 0  . triamcinolone cream (KENALOG) 0.1 % Apply 1 application topically daily as needed.      No current facility-administered medications for this visit.     REVIEW OF SYSTEMS:    10 Point review of Systems was done is negative except as noted above.  PHYSICAL EXAMINATION: ECOG PERFORMANCE STATUS: 2 - Symptomatic, <50% confined to bed  Vital signs reviewed in Epic  GENERAL:alert, in no acute distress and comfortable SKIN:Dry skin with some areas with scaly itchy. Not on the face or hands . Predominantly on the upper extremities . Areas of senile purpura . EYES: normal, conjunctiva are pink and non-injected, sclera clear OROPHARYNX:no exudate, no erythema and lips, buccal mucosa, and tongue normal  NECK: supple, no JVD, thyroid normal size, non-tender, without nodularity LYMPH:  no palpable lymphadenopathy in the cervical, axillary or inguinal LUNGS: clear to auscultation with normal respiratory effort HEART: regular rate & rhythm,  no murmurs and no lower extrety edem ABDOMEN: abdomen soft, non-tender, normoactive bowel sounds  Musculoskeletal: no cyanosis of digits and no clubbing  PSYCH: alert & oriented x 3 with fluent speech NEURO: no focal motor/sensory deficits  LABORATORY DATA:  I have reviewed the data as listed   . CBC Latest Ref Rng & Units 07/07/2017 06/24/2017 06/14/2017  WBC 3.9  - 10.3 10e3/uL 7.3 - 11.1(H)  Hemoglobin 11.6 - 15.9 g/dL 12.1 13.3 11.9  Hematocrit 34.8 - 46.6 % 37.8 39.0 37.4  Platelets 145 - 400 10e3/uL 396 - 496(H)   . CMP Latest Ref Rng & Units 07/07/2017 06/24/2017 06/14/2017  Glucose 70 - 140 mg/dl 121 111(H) 93  BUN 7.0 - 26.0 mg/dL 15.6 17 12   Creatinine 0.6 - 1.1 mg/dL 0.9 0.80 0.86  Sodium 136 - 145 mEq/L 142 140 142  Potassium 3.5 - 5.1 mEq/L 5.0 4.1 4.9  Chloride 101 - 111 mmol/L - 104 103  CO2 22 - 29 mEq/L 27 - 25  Calcium 8.4 - 10.4 mg/dL 9.9 - 9.5  Total Protein 6.4 - 8.3 g/dL 7.5 - -  Total Bilirubin 0.20 - 1.20 mg/dL 0.43 - -  Alkaline Phos 40 - 150 U/L 106 - -  AST 5 - 34 U/L 23 - -  ALT 0 - 55 U/L 17 - -   Sed rate -pending from today  RADIOGRAPHIC STUDIES: I have personally reviewed the radiological images as listed and agreed with the findings in the report. Dg Thoracolumabar Spine  Result Date: 06/24/2017 CLINICAL DATA:  T10 kyphoplasty EXAM: THORACOLUMBAR SPINE 1V COMPARISON:  MRI lumbar spine dated 05/22/2017 FLUOROSCOPY TIME:  1 minutes 9 seconds FINDINGS: Moderate to severe compression fracture deformity status post vertebral augmentation. Vertebral cement in the vertebral body and intervertebral disc spaces above and below the surgical level. IMPRESSION: Status post vertebral augmentation, as above. Electronically Signed   By: Julian Hy M.D.   On: 06/24/2017 11:18   Nm Pet Image Restag (ps) Skull Base To Thigh  Result Date: 07/05/2017 CLINICAL DATA:  Subsequent treatment strategy for nodular sclerosis Hodgkin' s lymphoma. EXAM: NUCLEAR MEDICINE PET SKULL BASE TO THIGH TECHNIQUE: 5.9 mCi F-18 FDG was injected intravenously. Full-ring PET imaging was performed from the skull base to thigh after the radiotracer. CT data was obtained and used for attenuation correction and anatomic localization. FASTING BLOOD GLUCOSE:  Value: 110 mg/dl COMPARISON:  04/19/2017 FINDINGS: NECK No hypermetabolic lymph nodes in the neck.  Atherosclerotic calcification  of the carotid bulbs. CHEST Right paratracheal, AP window, and likely hilar adenopathy, with a right paratracheal node measuring 2.0 cm in short axis (formerly the same) having a maximum SUV of 2.7 (formerly the same). Dear remaining lymph nodes similarly demonstrate. Low-level activity and no significant change. Background mediastinal blood pool activity SUV 2.4. A stable 4 mm nodule laterally in the right lower lobe is not hypermetabolic but is below sensitive PET-CT size thresholds, and appear stable. There is also some atelectasis in the posterior basal segment right lower lobe. ABDOMEN/PELVIS Small gastrohepatic ligament and retroperitoneal lymph nodes are present. A left periaortic node measures 1.0 cm in short axis on image 98/4 (formerly the same) with maximum SUV 2.2 (formerly 1.6). Aortoiliac atherosclerotic vascular disease. Prominent stool throughout the colon favors constipation. Scattered sigmoid colon diverticula. Hepatic background activity SUV:  3.3. SKELETON No focal hypermetabolic activity to suggest skeletal metastasis. Compression fracture at the T10 vertebral level with associated low grade metabolic activity from healing response. The generalized accentuated marrow activity shown on the prior exam has resolved Low-level activity along the superior endplate of L4 associated with benign-appearing prior fracture is well. IMPRESSION: 1. Essentially stable appearance, with enlarged lymph nodes in the chest and abdomen demonstrating Deauville to an Deauville 3 level activity. 2. Compression fractures at T10 and L4 with benign healing activity. 3.  Prominent stool throughout the colon favors constipation. 4.  Aortic Atherosclerosis (ICD10-I70.0). Electronically Signed   By: Van Clines M.D.   On: 07/05/2017 17:31   Dg C-arm 1-60 Min  Result Date: 06/24/2017 CLINICAL DATA:  T10 kyphoplasty EXAM: THORACOLUMBAR SPINE 1V COMPARISON:  MRI lumbar spine dated  05/22/2017 FLUOROSCOPY TIME:  1 minutes 9 seconds FINDINGS: Moderate to severe compression fracture deformity status post vertebral augmentation. Vertebral cement in the vertebral body and intervertebral disc spaces above and below the surgical level. IMPRESSION: Status post vertebral augmentation, as above. Electronically Signed   By: Julian Hy M.D.   On: 06/24/2017 11:18     Transthoracic Echocardiography  Patient:    Eldana, Isip MR #:       [id number] Study Date: 06/11/2016 Gender:     F Age:        48 Height:     162.6 cm Weight:     62 kg BSA:        1.68 m^2 Pt. Status: Room:   Jetty Duhamel, M.D.  REFERRING    [name], M.D.  ATTENDING    Loralie Champagne, M.D.  PERFORMING   [facility], Outpatient  SONOGRAPHER  Bethany McMahill, RDCS  cc:  ------------------------------------------------------------------- LV EF: 60% -   65%  ------------------------------------------------------------------- Indications:      TIA (G45.9).  ------------------------------------------------------------------- History:   PMH:   Coronary artery disease.  Stroke.  Risk factors: Family history of coronary artery disease. Former tobacco use. Hypertension. Dyslipidemia.  ------------------------------------------------------------------- Study Conclusions  - Left ventricle: The cavity size was normal. Wall thickness was   increased in a pattern of mild LVH. Systolic function was normal.   The estimated ejection fraction was in the range of 60% to 65%.   Wall motion was normal; there were no regional wall motion   abnormalities. Doppler parameters are consistent with abnormal   left ventricular relaxation (grade 1 diastolic dysfunction).   Normal strain pattern, GLS -22.7%. - Aortic valve: There was no stenosis. - Mitral valve: Mildly calcified annulus. There was no significant   regurgitation. - Left atrium: The atrium was  mildly dilated. - Right ventricle:  The cavity size was normal. Systolic function   was normal. - Pulmonary arteries: PA peak pressure: 19 mm Hg (S). - Inferior vena cava: The vessel was normal in size. The   respirophasic diameter changes were in the normal range (= 50%),   consistent with normal central venous pressure.  Impressions:  - Normal LV size and systolic function, EF 76-54%. Mild LV   hypertrophy. Normal RV size and systolic function. No significant   valvular abnormalities.   ASSESSMENT & PLAN:   83 year old Caucasian female with ECOG performance status of 2 with  1) Classical Hodgkin's lymphoma of nodular sclerosis variety . Atleast Stage IIIB some concern for possible Rt lung involvement which make in Stage IVB Presented with right neck lymph nodes . She had type B constitutional symptoms with 15-20 pound weight loss night sweats or chills .noted to have significant pruritus with mild rash likely from Hodgkin's which have all resolved   Wt Readings from Last 3 Encounters:  07/07/17 119 lb 8 oz (54.2 kg)  07/02/17 117 lb 11.2 oz (53.4 kg)  06/24/17 118 lb (53.5 kg)  - her constitutional symptoms, pruritus have resolved. Rt neck swelling Appears to have resolved. PET/CT scan after 5 cycles shows marked response to chemotherapy and stable predominantly Deauville 2 as per discussion with the radiology.  2) Microcytic anemia - likely anemia of chronic disease due to Hodgkin's lymphoma .  No evidence of iron def. Improved after PRBC transfusion . Stable. Now partly from chemotherapy 3) Neutrophilic leucocytosis - likely from steroid and Neulasta. No evidence of infection. Now resolved. PLAN - labs stable. No prohibitive toxicities - PET/CT scan results reviewed in detail. She has stable disease with no evidence of progression . no indication for additional treatment at this time . - we shall follow up on her sedimentation rate pending from today - we'll plan to see her back in 3 months with labs for a  clinical evaluation .  4) Rash and pruritus  -Grade 1 likely related to chemotherapy. Sweet syndrome from neulasta is on the differential diagnosis. This is now resolved   5) T10 compression fracture based on imaging done on 05/22/2017 in ED Now status post kyphoplasty with significant improvement in her back pain . PET/CT scan also shows possible L4 compression fracture . These compression fractures appear to be related to osteoporosis . She was previously on Fosamax that cause generalized body aches . Plan -Continue follow-up with orthopedics for management of her vertebral compression fractures. -Continue follow-up with primary care physician to optimize treatment for her osteoporosis. Will need optimization of her vitamin D levels closer to 50-60 with adequate calcium replacement and possible consideration of parenteral bisphosphonates versus Prolia. -Pain management as per primary care physician in orthopedics .  6) recently noted triple-vessel coronary artery disease based on cardiac cath patient is surprisingly asymptomatic. -Continue management as per PCP and cardiology.  7) . Patient Active Problem List   Diagnosis Date Noted  . CAD S/P percutaneous coronary angioplasty 07/02/2017  . Closed compression fracture of L2 lumbar vertebra ([place]) 06/24/2017  . Abnormal nuclear cardiac imaging test 06/16/2017  . Malnutrition of moderate degree 04/27/2017  . Symptomatic anemia 04/26/2017  . Hodgkin lymphoma ([place]) 04/26/2017  . Port catheter in place 02/02/2017  . Nodular sclerosis Hodgkin lymphoma of lymph nodes of neck ([place]) 01/13/2017  . Dyspnea 09/14/2012  . Epistaxis 06/02/2011  . HYPERCHOLESTEROLEMIA, MIXED 01/08/2009  . Essential hypertension, benign 01/08/2009  .  Coronary atherosclerosis 01/08/2009  . Hypothyroidism 01/08/2009  PLAN -Continue follow-up with primary care physician for management of other chronic medical co-morbidities.  RTC in 6 weeks for port flush RTC with  Dr [name] in 3 months with labs and for port flush  All of the patients questions were answered with apparent satisfaction. The patient knows to call the clinic with any problems, questions or concerns.  I spent 30 minutes counseling the patient face to face. The total time spent in the appointment was 40 minutes and more than 50% was on counseling and direct patient cares and multiple phone calls to co-ordinate her cares.    Sullivan Lone MD [place] AAHIVMS [facility] [place] Hematology/Oncology Physician [facility]  (Office):       [phone] (Work cell):  [phone] (Fax):           [phone]

## 2017-07-13 NOTE — Discharge Summary (Signed)
Physician Discharge Summary  Patient ID: Kim Lane MRN: [id number] DOB/AGE: 81-Nov-1935 81 y.o.  Admit date: 06/24/2017 Discharge date: 06/25/17  Admission Diagnoses:  T10 compression fracture  Discharge Diagnoses:  Active Problems:   Closed compression fracture of L2 lumbar vertebra [facility])   Past Medical History:  Diagnosis Date  . Anemia   . Anginal pain ([place])    occ  . Blood dyscrasia 12/2016   hodgins lymphoma tx-remission  . CAD (coronary artery disease) 02/2006   Taxus stent placed to LAD and Diagonal per Dr. Olevia Perches  . Cataracts, bilateral   . Complication of anesthesia   . HTN (hypertension)   . Hypercholesterolemia   . Hypothyroidism   . IBS (irritable bowel syndrome)   . Osteoarthritis   . PONV (postoperative nausea and vomiting)   . Stroke [facility]) 03/2016    Surgeries: Procedure(s): KYPHOPLASTY T10 on 06/24/2017   Consultants (if any):   Discharged Condition: Improved  Hospital Course: Kim Lane is an 83 y.o. female who was admitted 06/24/2017 with a diagnosis of T10 Compression Fracture and went to the operating room on 06/24/2017 and underwent the above named procedures.  Pt reports a low level of pain that is controlled on oral medications.  The pt is ambulating in the hallway.  The pt is voiding w/o difficulty. Pt was cleared by PT to be discharged.   She was given perioperative antibiotics:  Anti-infectives    Start     Dose/Rate Route Frequency Ordered Stop   06/24/17 1830  ceFAZolin (ANCEF) IVPB 2g/100 mL premix     2 g 200 mL/hr over 30 Minutes Intravenous Every 8 hours 06/24/17 1227 06/25/17 0320   06/24/17 0802  ceFAZolin (ANCEF) IVPB 2g/100 mL premix     2 g 200 mL/hr over 30 Minutes Intravenous 30 min pre-op 06/24/17 0802 06/24/17 1030    .  She was given sequential compression devices, early ambulation, and TED for DVT prophylaxis.  She benefited maximally from the hospital stay and there were no complications.    Recent vital signs:   Vitals:   06/25/17 0344 06/25/17 0804  BP: 119/65 128/65  Pulse: 76 68  Resp: 18 16  Temp: 98 F (36.7 C) 98.7 F (37.1 C)    Recent laboratory studies:  Lab Results  Component Value Date   HGB 12.1 07/07/2017   HGB 13.3 06/24/2017   HGB 11.9 06/14/2017   Lab Results  Component Value Date   WBC 7.3 07/07/2017   PLT 396 07/07/2017   Lab Results  Component Value Date   INR 1.0 06/14/2017   Lab Results  Component Value Date   NA 142 07/07/2017   K 5.0 07/07/2017   CL 104 06/24/2017   CO2 27 07/07/2017   BUN 15.6 07/07/2017   CREATININE 0.9 07/07/2017   GLUCOSE 121 07/07/2017    Discharge Medications:   Allergies as of 06/25/2017      Reactions   Fosamax [alendronate Sodium] Other (See Comments)   "made me ache all over"      Medication List    TAKE these medications   aspirin 81 MG tablet Take 81 mg by mouth daily.   CENTRUM SILVER PO Take 1 tablet by mouth daily.   Coenzyme Q10 100 MG capsule Take 100 mg by mouth daily.   Fish Oil 1000 MG Caps Take 1 capsule by mouth daily.   lactose free nutrition Liqd Take 237 mLs by mouth 3 (three) times daily with meals.  levothyroxine 88 MCG tablet Commonly known as:  SYNTHROID, LEVOTHROID Take 88 mcg by mouth daily before breakfast.   metoprolol succinate 25 MG 24 hr tablet Commonly known as:  TOPROL-XL Take 12.5 mg by mouth every other day.   nitroGLYCERIN 0.4 MG/SPRAY spray Commonly known as:  NITROLINGUAL Place 1 spray under the tongue every 5 (five) minutes x 3 doses as needed for chest pain (3 sprays max).   ondansetron 4 MG disintegrating tablet Commonly known as:  ZOFRAN ODT Take 1 tablet (4 mg total) by mouth every 8 (eight) hours as needed for nausea or vomiting.   oxyCODONE-acetaminophen 5-325 MG tablet Commonly known as:  ROXICET Take 1 tablet by mouth every 6 (six) hours as needed. What changed:  how much to take  when to take this  reasons to take this   PROAIR HFA 108 (90  Base) MCG/ACT inhaler Generic drug:  albuterol Inhale 2 puffs into the lungs every 4 (four) hours as needed for shortness of breath.   RESTASIS 0.05 % ophthalmic emulsion Generic drug:  cycloSPORINE Place 2 drops into both eyes 2 (two) times daily. For dry eyes   traZODone 50 MG tablet Commonly known as:  DESYREL Take 0.5-1 tablets (25-50 mg total) by mouth at bedtime as needed for sleep.   triamcinolone cream 0.1 % Commonly known as:  KENALOG Apply 1 application topically daily as needed.       Diagnostic Studies: Dg Thoracolumabar Spine  Result Date: 06/24/2017 CLINICAL DATA:  T10 kyphoplasty EXAM: THORACOLUMBAR SPINE 1V COMPARISON:  MRI lumbar spine dated 05/22/2017 FLUOROSCOPY TIME:  1 minutes 9 seconds FINDINGS: Moderate to severe compression fracture deformity status post vertebral augmentation. Vertebral cement in the vertebral body and intervertebral disc spaces above and below the surgical level. IMPRESSION: Status post vertebral augmentation, as above. Electronically Signed   By: Julian Hy M.D.   On: 06/24/2017 11:18   Nm Pet Image Restag (ps) Skull Base To Thigh  Result Date: 07/05/2017 CLINICAL DATA:  Subsequent treatment strategy for nodular sclerosis Hodgkin' s lymphoma. EXAM: NUCLEAR MEDICINE PET SKULL BASE TO THIGH TECHNIQUE: 5.9 mCi F-18 FDG was injected intravenously. Full-ring PET imaging was performed from the skull base to thigh after the radiotracer. CT data was obtained and used for attenuation correction and anatomic localization. FASTING BLOOD GLUCOSE:  Value: 110 mg/dl COMPARISON:  04/19/2017 FINDINGS: NECK No hypermetabolic lymph nodes in the neck. Atherosclerotic calcification of the carotid bulbs. CHEST Right paratracheal, AP window, and likely hilar adenopathy, with a right paratracheal node measuring 2.0 cm in short axis (formerly the same) having a maximum SUV of 2.7 (formerly the same). Dear remaining lymph nodes similarly demonstrate. Low-level  activity and no significant change. Background mediastinal blood pool activity SUV 2.4. A stable 4 mm nodule laterally in the right lower lobe is not hypermetabolic but is below sensitive PET-CT size thresholds, and appear stable. There is also some atelectasis in the posterior basal segment right lower lobe. ABDOMEN/PELVIS Small gastrohepatic ligament and retroperitoneal lymph nodes are present. A left periaortic node measures 1.0 cm in short axis on image 98/4 (formerly the same) with maximum SUV 2.2 (formerly 1.6). Aortoiliac atherosclerotic vascular disease. Prominent stool throughout the colon favors constipation. Scattered sigmoid colon diverticula. Hepatic background activity SUV:  3.3. SKELETON No focal hypermetabolic activity to suggest skeletal metastasis. Compression fracture at the T10 vertebral level with associated low grade metabolic activity from healing response. The generalized accentuated marrow activity shown on the prior exam has resolved Low-level activity  along the superior endplate of L4 associated with benign-appearing prior fracture is well. IMPRESSION: 1. Essentially stable appearance, with enlarged lymph nodes in the chest and abdomen demonstrating Deauville to an Deauville 3 level activity. 2. Compression fractures at T10 and L4 with benign healing activity. 3.  Prominent stool throughout the colon favors constipation. 4.  Aortic Atherosclerosis (ICD10-I70.0). Electronically Signed   By: Van Clines M.D.   On: 07/05/2017 17:31   Dg C-arm 1-60 Min  Result Date: 06/24/2017 CLINICAL DATA:  T10 kyphoplasty EXAM: THORACOLUMBAR SPINE 1V COMPARISON:  MRI lumbar spine dated 05/22/2017 FLUOROSCOPY TIME:  1 minutes 9 seconds FINDINGS: Moderate to severe compression fracture deformity status post vertebral augmentation. Vertebral cement in the vertebral body and intervertebral disc spaces above and below the surgical level. IMPRESSION: Status post vertebral augmentation, as above.  Electronically Signed   By: Julian Hy M.D.   On: 06/24/2017 11:18    Disposition: 01-Home or Self Care Post op medication provided  Pt will present to clinic in 2 weeks  Discharge Instructions    Incentive spirometry RT    Complete by:  As directed          Signed: Valinda Hoar 07/13/2017, 1:04 PM

## 2017-07-23 ENCOUNTER — Encounter: Payer: Self-pay | Admitting: Family Medicine

## 2017-07-23 ENCOUNTER — Ambulatory Visit (INDEPENDENT_AMBULATORY_CARE_PROVIDER_SITE_OTHER): Payer: Medicare Other | Admitting: Family Medicine

## 2017-07-23 DIAGNOSIS — I10 Essential (primary) hypertension: Secondary | ICD-10-CM

## 2017-07-23 DIAGNOSIS — C8190 Hodgkin lymphoma, unspecified, unspecified site: Secondary | ICD-10-CM

## 2017-07-23 DIAGNOSIS — E039 Hypothyroidism, unspecified: Secondary | ICD-10-CM

## 2017-07-23 DIAGNOSIS — E7800 Pure hypercholesterolemia, unspecified: Secondary | ICD-10-CM

## 2017-07-23 DIAGNOSIS — M810 Age-related osteoporosis without current pathological fracture: Secondary | ICD-10-CM | POA: Insufficient documentation

## 2017-07-23 DIAGNOSIS — Z862 Personal history of diseases of the blood and blood-forming organs and certain disorders involving the immune mechanism: Secondary | ICD-10-CM

## 2017-07-23 NOTE — Progress Notes (Addendum)
Impression and Recommendations:    1. Hodgkin lymphoma, unspecified Hodgkin lymphoma type, unspecified body region ([place])   2. Acquired hypothyroidism   3. HYPERCHOLESTEROLEMIA, MIXED   4. Essential hypertension, benign   5. History of iron deficiency anemia-  by ONC    - txmnt per Onc - obtain labs near future to assess thyroid status - obtain FLP, CMP - Bp - stable - obtain CBC- gets tx from ONC occ.  Not c/w Fe def but microcytic anemia of chronic dx.    Goal- to walk around [employer] with friends at least 3 d/wk --- over time working her way up to 14min each time she walks  - make appt for fasting bldwrk near future- lab only  FLP, TSH, T4, A1c, vit D  The patient was counseled, risk factors were discussed, anticipatory guidance given.  Gross side effects, risk and benefits, and alternatives of medications and treatment plan in general discussed with patient.  Patient is aware that all medications have potential side effects and we are unable to predict every side effect or drug-drug interaction that may occur.   Patient will call with any questions prior to using medication if they have concerns.  Expresses verbal understanding and consents to current therapy and treatment regimen.  No barriers to understanding were identified.  Red flag symptoms and signs discussed in detail.  Patient expressed understanding regarding what to do in case of emergency\urgent symptoms  Please see AVS handed out to patient at the end of our visit for further patient instructions/ counseling done pertaining to today's office visit.   Return in about 4 months (around 11/22/2017) for thyroid, hld, htn- f/up if needed to discuss labs if txmnt needed.     Note: This document was prepared using Dragon voice recognition software and may include unintentional dictation errors.  [name] 1:53  PM --------------------------------------------------------------------------------------------------------------------------------------------------------------------------------------------------------------------------------------------    Subjective:    CC:  Chief Complaint  Patient presents with  . Follow-up    HPI: RICA HEATHER is a 83 y.o. female who presents to [facility] today for issues as discussed below.   Second OV with me.   Changing docs to be closer to home and also- daughter went with her to her past PCP-   Sees heart Doc once yrly or so.   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   [place]    Exercise:  Sitting a lot of the time.   Her friends go to [employer] to walk and She plans on going w them in future.   - She is currently undergoing treatment for her Hodgkin's lymphoma with oncology.  Her staging is at least a IIIB and there is some concern for possible right lung involvement which would make her stage IVB.  She has microcytic anemia of chronic disease due to her Hodgkin's lymphoma.  No evidence of iron deficiency.  She was getting packed red blood cell transfusions from her hematoma physician.  - She also had neutrophilic leukocytosis likely from steroid and her chemotherapy agents.  - treatment for her osteoporosis. Will need optimization of her vitamin D levels closer to 50-60 with adequate calcium replacement and possible consideration of parenteral bisphosphonates versus Prolia per Onc provider recently seen.   She recently underwent a T10 kyphoplasty due to ongoing back pain- ? Pathologic from [facility]  lymphoma and needed back surgery with Dr Rolena Infante.  This has helped her pain somewhat and she still uses a couple Percocets a day, and her cane to ambulate.  Getting pain meds from her Ortho surgeon.   In preparation for the surgery patient  also underwent cardiac catheterization and was found to have triple-vessel coronary artery disease.  She remains asymptomatic.  She is followed by Dr. Jaye Beagle of cardiology   Wt Readings from Last 3 Encounters:  09/15/17 120 lb 1.9 oz (54.5 kg)  07/23/17 115 lb (52.2 kg)  07/07/17 119 lb 8 oz (54.2 kg)   BP Readings from Last 3 Encounters:  09/15/17 112/60  07/23/17 122/78  07/07/17 (!) 157/90   Pulse Readings from Last 3 Encounters:  09/15/17 76  07/23/17 77  07/07/17 73   BMI Readings from Last 3 Encounters:  09/15/17 20.62 kg/m  07/23/17 19.74 kg/m  07/07/17 20.51 kg/m     Patient Care Team    Relationship Specialty Notifications Start End  [name], DO PCP - General Family Medicine  07/02/17   Josue Hector, MD Consulting Physician Cardiology  07/02/17   Penni Bombard, MD Consulting Physician Neurology  07/02/17   Jarome Matin, MD Consulting Physician Dermatology  07/02/17   Rozetta Nunnery, MD Consulting Physician Otolaryngology  07/02/17   Brunetta Genera, MD Consulting Physician Hematology  07/02/17   Melina Schools, MD Consulting Physician Orthopedic Surgery  07/02/17   Melina Schools, [place] Physician Orthopedic Surgery  09/16/17 09/16/17  Josue Hector, MD Consulting Physician Cardiology  09/16/17      Patient Active Problem List   Diagnosis Date Noted  . Hodgkin lymphoma ([place]) 04/26/2017    Priority: High  . Essential hypertension, benign 01/08/2009    Priority: High  . Coronary atherosclerosis- sees Dr. Johnsie Cancel 01/08/2009    Priority: High  . Closed compression fracture of L2 lumbar vertebra ([place]) 06/24/2017    Priority: Medium  . Symptomatic anemia 04/26/2017    Priority: Medium  . HYPERCHOLESTEROLEMIA, MIXED 01/08/2009    Priority: Medium  . Hypothyroidism 01/08/2009    Priority: Medium  . Osteoporosis 07/23/2017    Priority: Low  . CAD S/P percutaneous coronary angioplasty 07/02/2017    Priority: Low  .  Osteoarthritis 09/16/2017  . History of iron deficiency anemia-  by ONC 07/23/2017  . Abnormal nuclear cardiac imaging test 06/16/2017  . Malnutrition of moderate degree 04/27/2017  . Port catheter in place 02/02/2017  . Nodular sclerosis Hodgkin lymphoma of lymph nodes of neck ([place]) 01/13/2017  . Dyspnea 09/14/2012  . Epistaxis 06/02/2011    Past Medical history, Surgical history, Family history, Social history, Allergies and Medications have been entered into the medical record, reviewed and changed as needed.    Current Meds  Medication Sig  . aspirin 81 MG tablet Take 81 mg by mouth daily.    . Coenzyme Q10 100 MG capsule Take 100 mg by mouth daily.   . iron polysaccharides (NIFEREX) 150 MG capsule Take 1 capsule (150 mg total) by mouth daily.  [name] Kitchen lactose free nutrition (BOOST PLUS) LIQD Take 237 mLs by mouth 3 (three) times daily with meals.  [name] Kitchen levothyroxine (SYNTHROID, LEVOTHROID) 88 MCG tablet Take 88 mcg by mouth daily before breakfast.   . metoprolol succinate (TOPROL-XL) 25 MG 24 hr tablet Take 12.5 mg by mouth every other day.   . Multiple Vitamins-Minerals (CENTRUM SILVER PO) Take 1 tablet by mouth daily.    . nitroGLYCERIN (NITROLINGUAL) 0.4  MG/SPRAY spray Place 1 spray under the tongue every 5 (five) minutes x 3 doses as needed for chest pain (3 sprays max).  . Omega-3 Fatty Acids (FISH OIL) 1000 MG CAPS Take 1 capsule by mouth daily.   . ondansetron (ZOFRAN ODT) 4 MG disintegrating tablet Take 1 tablet (4 mg total) by mouth every 8 (eight) hours as needed for nausea or vomiting.  [name] Kitchen oxyCODONE-acetaminophen (ROXICET) 5-325 MG tablet Take 1 tablet by mouth every 6 (six) hours as needed.  [name] Kitchen PROAIR HFA 108 (90 Base) MCG/ACT inhaler Inhale 2 puffs into the lungs every 4 (four) hours as needed for shortness of breath.   . RESTASIS 0.05 % ophthalmic emulsion Place 2 drops into both eyes 2 (two) times daily. For dry eyes  . traZODone (DESYREL) 50 MG tablet Take 0.5-1 tablets (25-50  mg total) by mouth at bedtime as needed for sleep.  [name] Kitchen triamcinolone cream (KENALOG) 0.1 % Apply 1 application topically daily as needed.     Allergies:  Allergies  Allergen Reactions  . Fosamax [Alendronate Sodium] Other (See Comments)    "made me ache all over"     Review of Systems: General:   Denies fever, chills, unexplained weight loss.  Optho/Auditory:   Denies visual changes, blurred vision/LOV Respiratory:   Denies wheeze, DOE more than baseline levels.  Cardiovascular:   Denies chest pain, palpitations, new onset peripheral edema  Gastrointestinal:   Denies nausea, vomiting, diarrhea, abd pain.  Genitourinary: Denies dysuria, freq/ urgency, flank pain or discharge from genitals.  Endocrine:     Denies hot or cold intolerance, polyuria, polydipsia. Musculoskeletal:   Denies unexplained myalgias, joint swelling, unexplained arthralgias, gait problems.  Skin:  Denies new onset rash, suspicious lesions Neurological:     Denies dizziness, unexplained weakness, numbness  Psychiatric/Behavioral:   Denies mood changes, suicidal or homicidal ideations, hallucinations    Objective:   Blood pressure 122/78, pulse 77, height 5\' 4"  (1.626 m), weight 115 lb (52.2 kg). Body mass index is 19.74 kg/m. General:  Well Developed, well nourished, appropriate for stated age.  Neuro:  Alert and oriented,  extra-ocular muscles intact  HEENT:  Normocephalic, atraumatic, neck supple, no carotid bruits appreciated  Skin:  no gross rash, warm, pink. Cardiac:  RRR, S1 S2 Respiratory:  ECTA B/L and A/P, Not using accessory muscles, speaking in full sentences- unlabored. Vascular:  Ext warm, no cyanosis apprec.; cap RF less 2 sec. Psych:  No HI/SI, judgement and insight good, Euthymic mood. Full Affect.

## 2017-07-23 NOTE — Patient Instructions (Signed)
Goal- to walk around [employer] with friends at least 3 d/wk --- over time working her way up to 3min each time she walks  - make appt for fasting bldwrk near future- lab only  FLP, TSH, T4, A1c, vit D  Please realize, EXERCISE IS MEDICINE!  -  American Heart Association [facility]) guidelines for exercise : If you are in good health, without any medical conditions, you should engage in 150 minutes of moderate intensity aerobic activity per week.  This means you should be huffing and puffing throughout your workout.   Engaging in regular exercise will improve brain function and memory, as well as improve mood, boost immune system and help with weight management.  As well as the other, more well-known effects of exercise such as decreasing blood sugar levels, decreasing blood pressure,  and decreasing bad cholesterol levels/ increasing good cholesterol levels.     -  The AHA strongly endorses consumption of a diet that contains a variety of foods from all the food categories with an emphasis on fruits and vegetables; fat-free and low-fat dairy products; cereal and grain products; legumes and nuts; and fish, poultry, and/or extra lean meats.    Excessive food intake, especially of foods high in saturated and trans fats, sugar, and salt, should be avoided.    Adequate water intake of roughly 1/2 of your weight in pounds, should equal the ounces of water per day you should drink.  So for instance, if you're 200 pounds, that would be 100 ounces of water per day.         Mediterranean Diet  Why follow it? Research shows. . Those who follow the Mediterranean diet have a reduced risk of heart disease  . The diet is associated with a reduced incidence of Parkinson's and Alzheimer's diseases . People following the diet may have longer life expectancies and lower rates of chronic diseases  . The Dietary Guidelines for Americans recommends the Mediterranean diet as an eating plan to promote health and  prevent disease  What Is the Mediterranean Diet?  . Healthy eating plan based on typical foods and recipes of Mediterranean-style cooking . The diet is primarily a plant based diet; these foods should make up a majority of meals   Starches - Plant based foods should make up a majority of meals - They are an important sources of vitamins, minerals, energy, antioxidants, and fiber - Choose whole grains, foods high in fiber and minimally processed items  - Typical grain sources include wheat, oats, barley, corn, brown rice, bulgar, farro, millet, polenta, couscous  - Various types of beans include chickpeas, lentils, fava beans, black beans, white beans   Fruits  Veggies - Large quantities of antioxidant rich fruits & veggies; 6 or more servings  - Vegetables can be eaten raw or lightly drizzled with oil and cooked  - Vegetables common to the traditional Mediterranean Diet include: artichokes, arugula, beets, broccoli, brussel sprouts, cabbage, carrots, celery, collard greens, cucumbers, eggplant, kale, leeks, lemons, lettuce, mushrooms, okra, onions, peas, peppers, potatoes, pumpkin, radishes, rutabaga, shallots, spinach, sweet potatoes, turnips, zucchini - Fruits common to the Mediterranean Diet include: apples, apricots, avocados, cherries, clementines, dates, figs, grapefruits, grapes, melons, nectarines, oranges, peaches, pears, pomegranates, strawberries, tangerines  Fats - Replace butter and margarine with healthy oils, such as olive oil, canola oil, and tahini  - Limit nuts to no more than a handful a day  - Nuts include walnuts, almonds, pecans, pistachios, pine nuts  - Limit or  avoid candied, honey roasted or heavily salted nuts - Olives are central to the Mediterranean diet - can be eaten whole or used in a variety of dishes   Meats Protein - Limiting red meat: no more than a few times a month - When eating red meat: choose lean cuts and keep the portion to the size of deck of cards -  Eggs: approx. 0 to 4 times a week  - Fish and lean poultry: at least 2 a week  - Healthy protein sources include, chicken, [place], lean beef, lamb - Increase intake of seafood such as tuna, salmon, trout, mackerel, shrimp, scallops - Avoid or limit high fat processed meats such as sausage and bacon  Dairy - Include moderate amounts of low fat dairy products  - Focus on healthy dairy such as fat free yogurt, skim milk, low or reduced fat cheese - Limit dairy products higher in fat such as whole or 2% milk, cheese, ice cream  Alcohol - Moderate amounts of red wine is ok  - No more than 5 oz daily for women (all ages) and men older than age 71  - No more than 10 oz of wine daily for men younger than 87  Other - Limit sweets and other desserts  - Use herbs and spices instead of salt to flavor foods  - Herbs and spices common to the traditional Mediterranean Diet include: basil, bay leaves, chives, cloves, cumin, fennel, garlic, lavender, marjoram, mint, oregano, parsley, pepper, rosemary, sage, savory, sumac, tarragon, thyme   It's not just a diet, it's a lifestyle:  . The Mediterranean diet includes lifestyle factors typical of those in the region  . Foods, drinks and meals are best eaten with others and savored . Daily physical activity is important for overall good health . This could be strenuous exercise like running and aerobics . This could also be more leisurely activities such as walking, housework, yard-work, or taking the stairs . Moderation is the key; a balanced and healthy diet accommodates most foods and drinks . Consider portion sizes and frequency of consumption of certain foods   Meal Ideas & Options:  . Breakfast:  o Whole wheat toast or whole wheat English muffins with peanut butter & hard boiled egg o Steel cut oats topped with apples & cinnamon and skim milk  o Fresh fruit: banana, strawberries, melon, berries, peaches  o Smoothies: strawberries, bananas, greek yogurt,  peanut butter o Low fat greek yogurt with blueberries and granola  o Egg white omelet with spinach and mushrooms o Breakfast couscous: whole wheat couscous, apricots, skim milk, cranberries  . Sandwiches:  o Hummus and grilled vegetables (peppers, zucchini, squash) on whole wheat bread   o Grilled chicken on whole wheat pita with lettuce, tomatoes, cucumbers or tzatziki  o Tuna salad on whole wheat bread: tuna salad made with greek yogurt, olives, red peppers, capers, green onions o Garlic rosemary lamb pita: lamb sauted with garlic, rosemary, salt & pepper; add lettuce, cucumber, greek yogurt to pita - flavor with lemon juice and black pepper  . Seafood:  o Mediterranean grilled salmon, seasoned with garlic, basil, parsley, lemon juice and black pepper o Shrimp, lemon, and spinach whole-grain pasta salad made with low fat greek yogurt  o Seared scallops with lemon orzo  o Seared tuna steaks seasoned salt, pepper, coriander topped with tomato mixture of olives, tomatoes, olive oil, minced garlic, parsley, green onions and cappers  . Meats:  o Herbed greek chicken salad with kalamata  olives, cucumber, feta  o Red bell peppers stuffed with spinach, bulgur, lean ground beef (or lentils) & topped with feta   o Kebabs: skewers of chicken, tomatoes, onions, zucchini, squash  o [place] burgers: made with red onions, mint, dill, lemon juice, feta cheese topped with roasted red peppers . Vegetarian o Cucumber salad: cucumbers, artichoke hearts, celery, red onion, feta cheese, tossed in olive oil & lemon juice  o Hummus and whole grain pita points with a greek salad (lettuce, tomato, feta, olives, cucumbers, red onion) o Lentil soup with celery, carrots made with vegetable broth, garlic, salt and pepper  o Tabouli salad: parsley, bulgur, mint, scallions, cucumbers, tomato, radishes, lemon juice, olive oil, salt and pepper.

## 2017-08-19 ENCOUNTER — Ambulatory Visit (HOSPITAL_BASED_OUTPATIENT_CLINIC_OR_DEPARTMENT_OTHER): Payer: Medicare Other

## 2017-08-19 DIAGNOSIS — Z452 Encounter for adjustment and management of vascular access device: Secondary | ICD-10-CM

## 2017-08-19 DIAGNOSIS — Z95828 Presence of other vascular implants and grafts: Secondary | ICD-10-CM

## 2017-08-19 DIAGNOSIS — C8111 Nodular sclerosis classical Hodgkin lymphoma, lymph nodes of head, face, and neck: Secondary | ICD-10-CM | POA: Diagnosis not present

## 2017-08-19 MED ORDER — HEPARIN SOD (PORK) LOCK FLUSH 100 UNIT/ML IV SOLN
500.0000 [IU] | Freq: Once | INTRAVENOUS | Status: AC | PRN
  Administered 2017-08-19: 500 [IU] via INTRAVENOUS
  Filled 2017-08-19: qty 5

## 2017-08-19 MED ORDER — SODIUM CHLORIDE 0.9% FLUSH
10.0000 mL | INTRAVENOUS | Status: DC | PRN
  Administered 2017-08-19: 10 mL via INTRAVENOUS
  Filled 2017-08-19: qty 10

## 2017-09-01 IMAGING — CR DG RIBS W/ CHEST 3+V*R*
3 series · 3 of 3 positions shown · non-contrast
Comparison: Chest x-ray 11/25/2016

CLINICAL DATA: Right back pain.

EXAM:
RIGHT RIBS AND CHEST - 3+ VIEW

[t ribs rpo right]
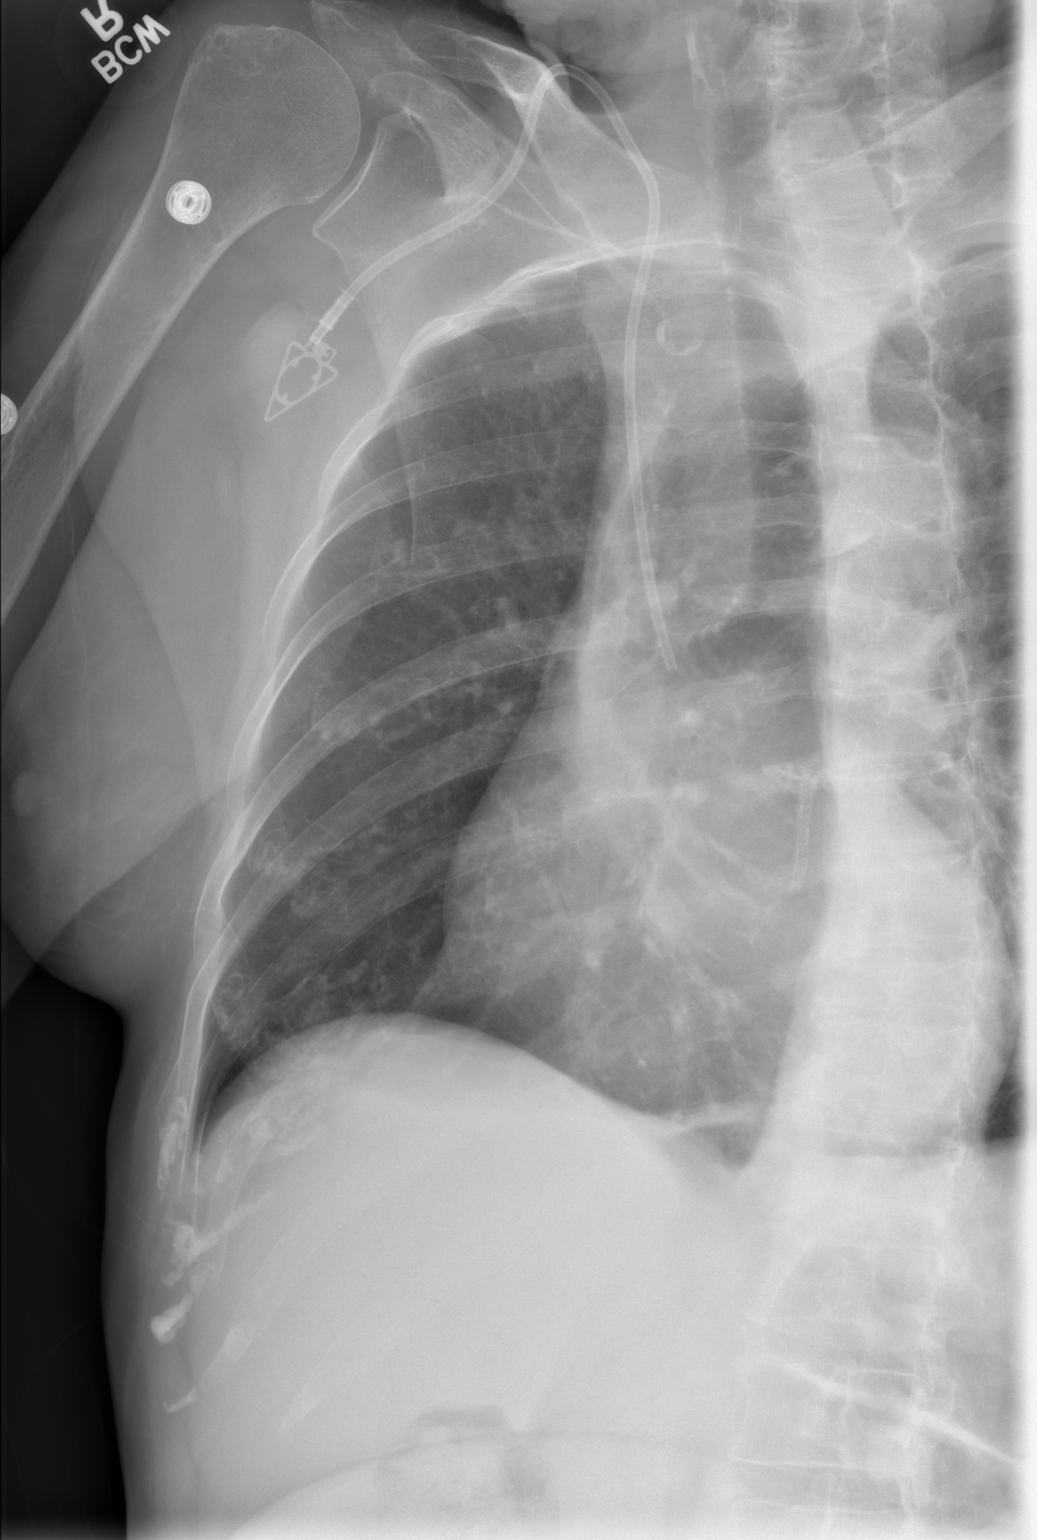

[t ribs ap lower right]
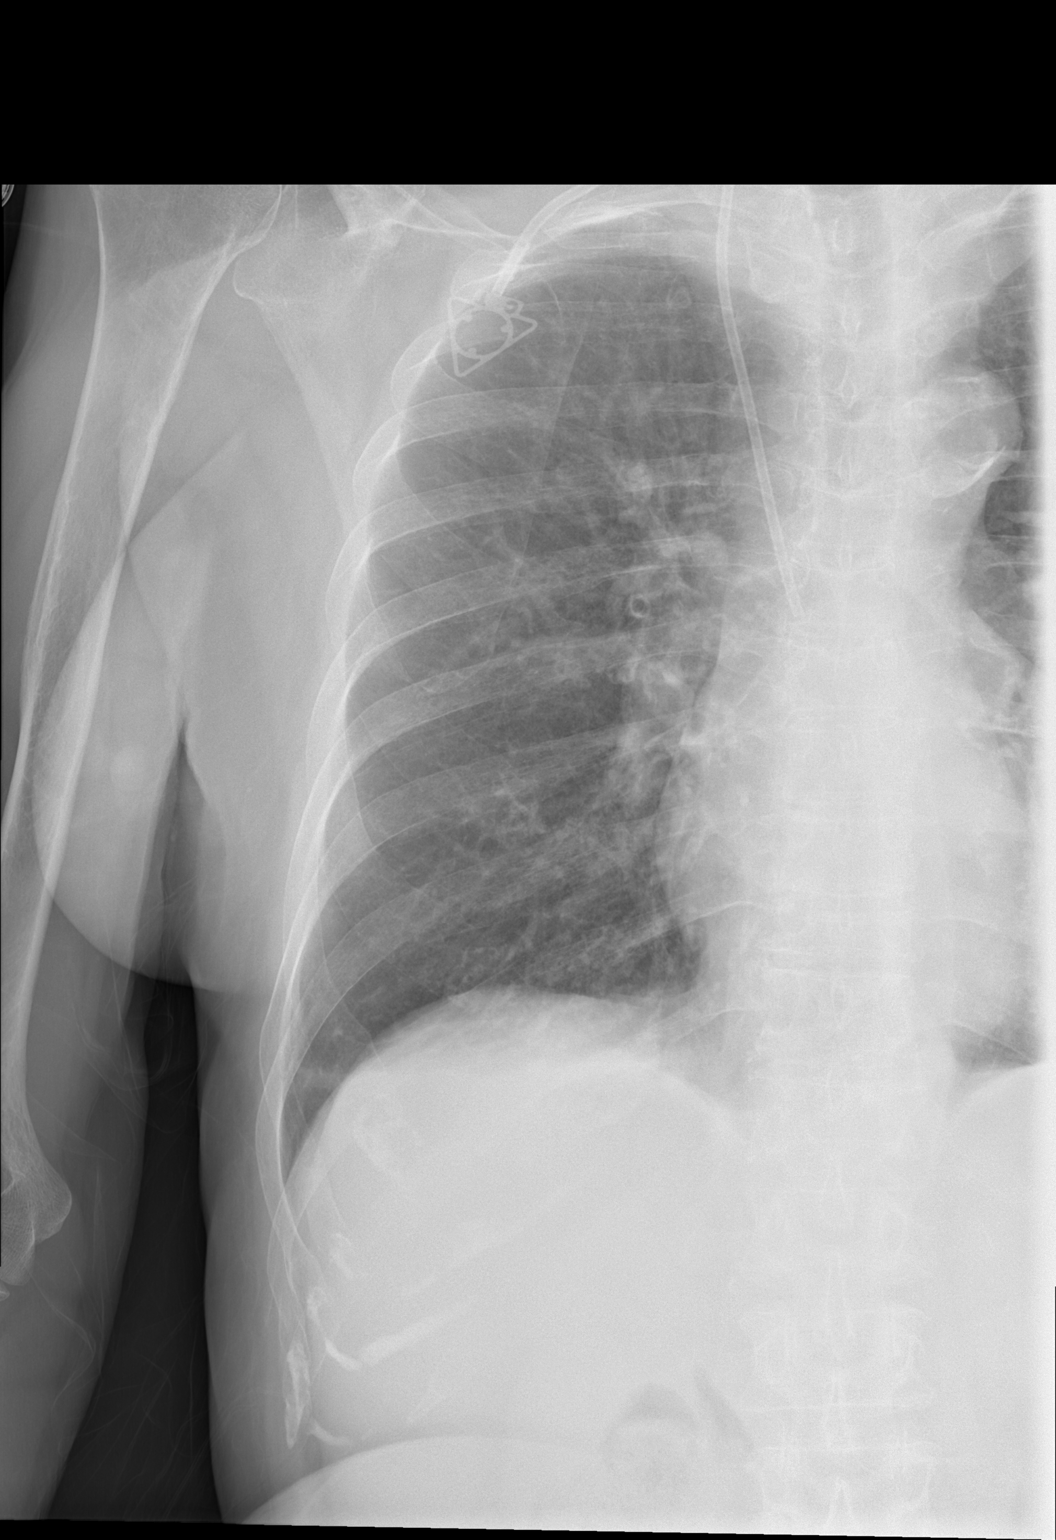

[x chest ap]
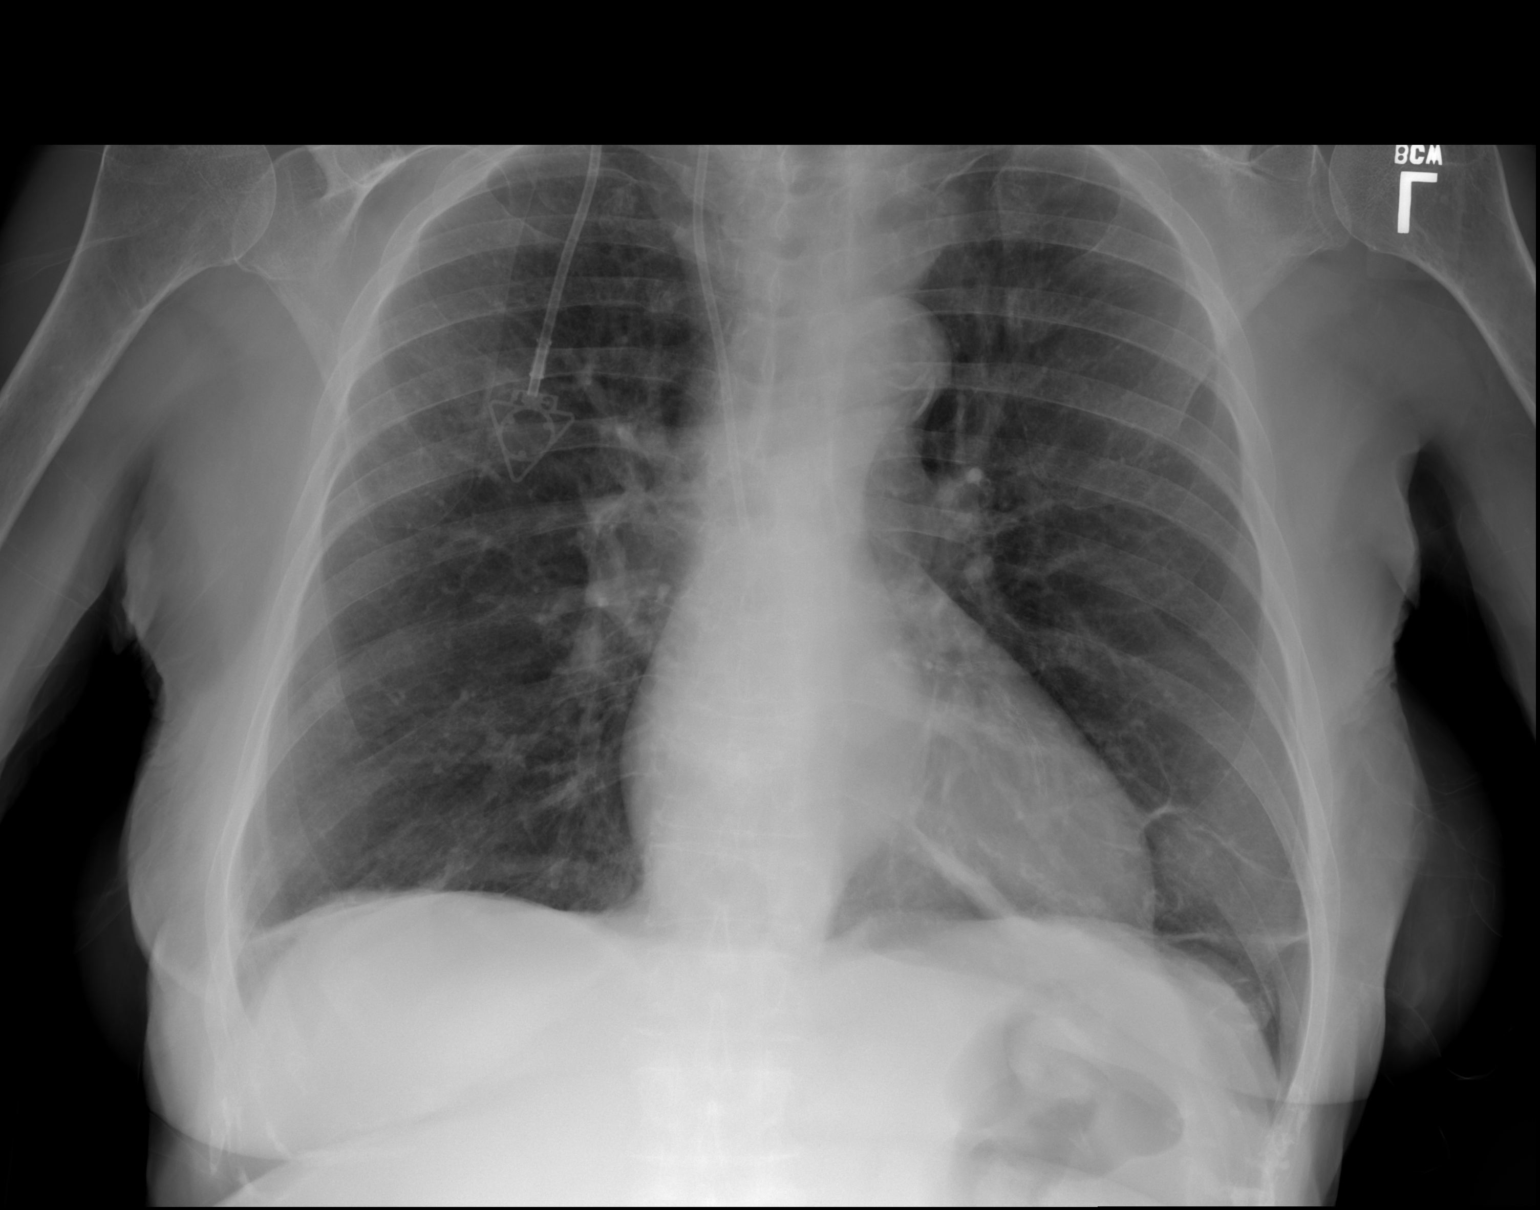

[3 of 3 positions shown; findings below may reference images not displayed]

FINDINGS: Frontal chest shows hyperexpansion. Subsegmental atelectasis noted
left lung base. No pulmonary edema or focal airspace consolidation.
Right Port-A-Cath tip overlies the proximal SVC. Oblique views of
the right ribs show no displaced right-sided rib fracture. No lytic
or sclerotic abnormality identified in the right ribs.
IMPRESSION: Negative.

## 2017-09-01 IMAGING — MR MR THORACIC SPINE W/O CM
4 of 5 series · 18 of 48 positions shown · non-contrast
Comparison: Head CT 04/19/2017

CLINICAL DATA: Mid back and thoracic spine pain. Urinary
incontinence.

EXAM:
MRI THORACIC AND LUMBAR SPINE WITHOUT CONTRAST
TECHNIQUE: Multiplanar and multiecho pulse sequences of the thoracic and lumbar
spine were obtained without intravenous contrast.

[Series 12: T1 · sagittal · 4.0mm · 0.51mm/px · 3 of 13 slices shown (1 of 2)]
[im 3/13]
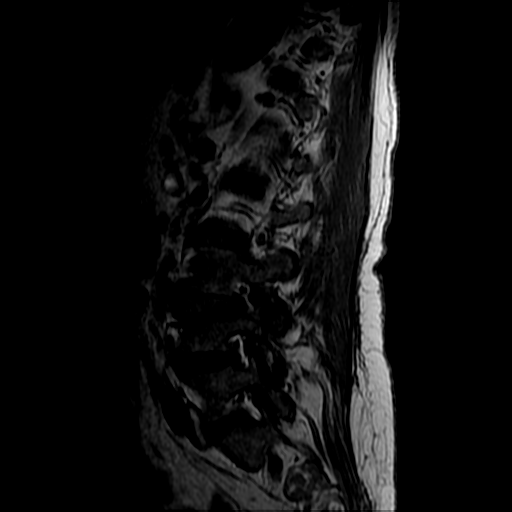
[im 8/13]
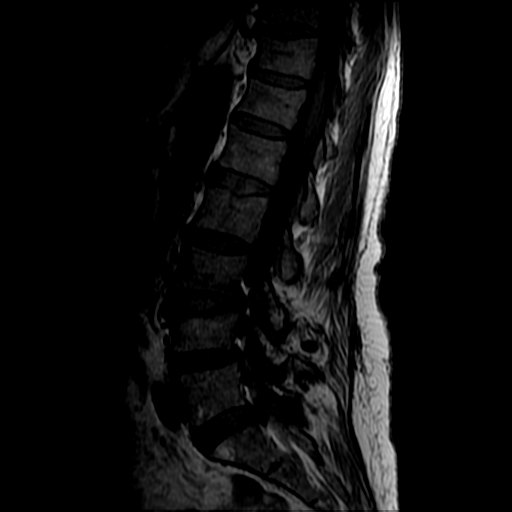
[im 13/13]
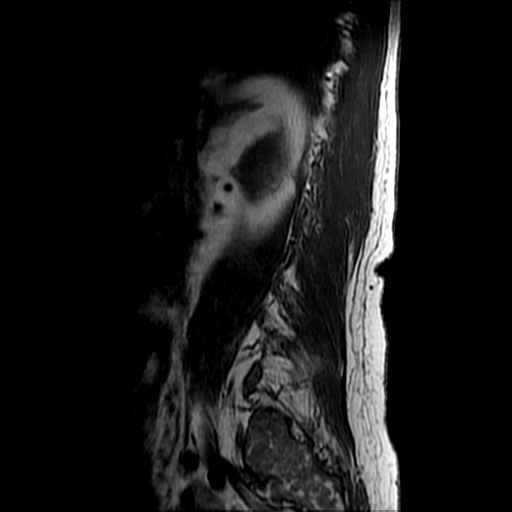

[Series 13: T2 post-contrast · sagittal · 4.0mm · 0.51mm/px · 5 of 13 slices shown]
[im 1/13]
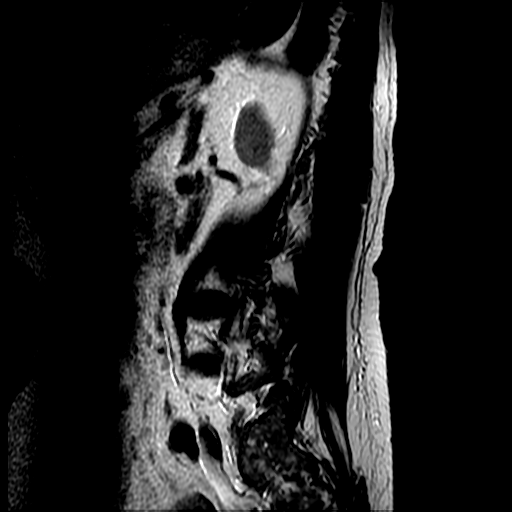
[im 4/13]
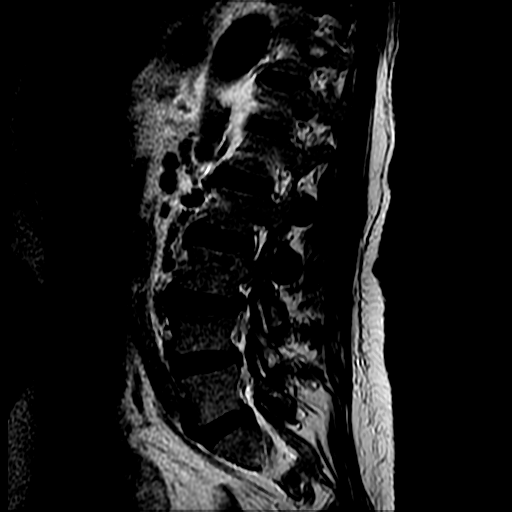
[im 7/13]
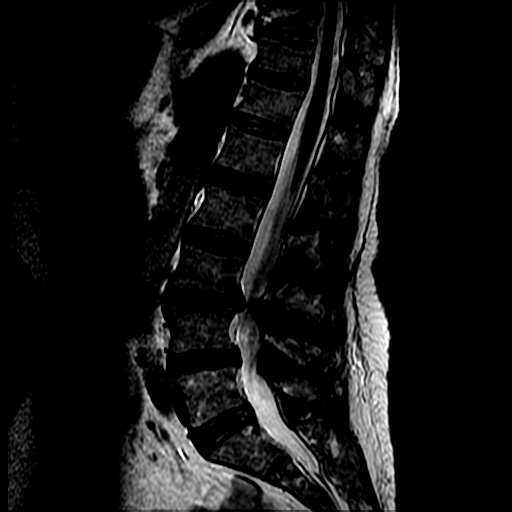
[im 10/13]
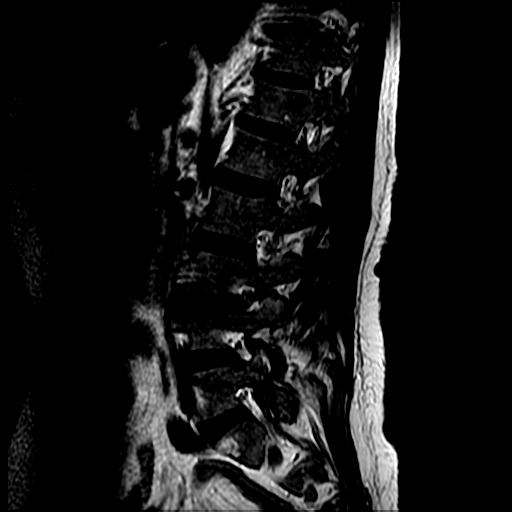
[im 13/13]
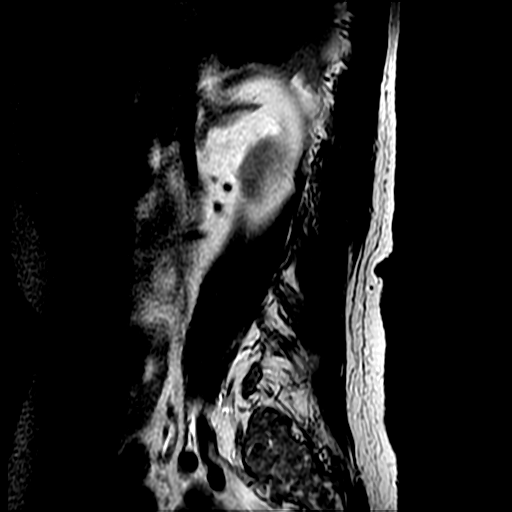

[Series 15: T2 · axial · 4.0mm · 0.39mm/px · z∈[-441,-266]mm · 7 of 38 slices shown]
[im 3/38]
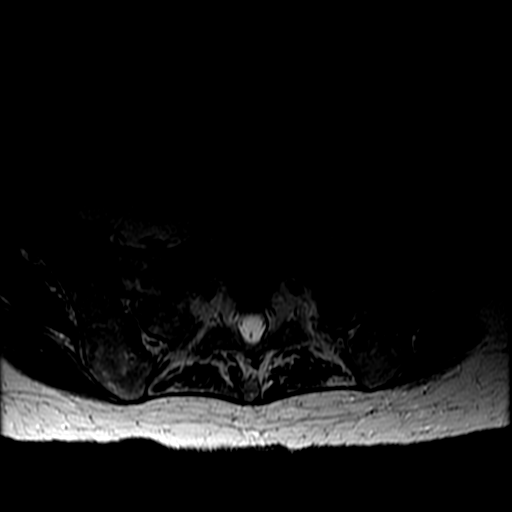
[im 5/38]
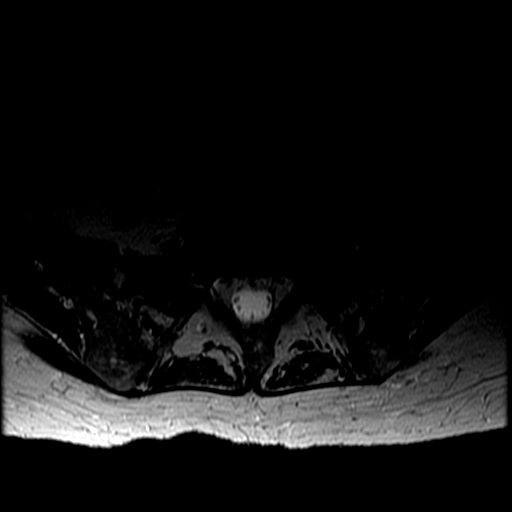
[im 8/38]
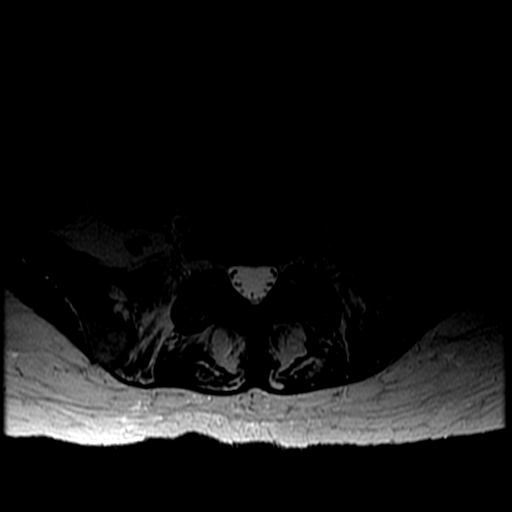
[im 13/38]
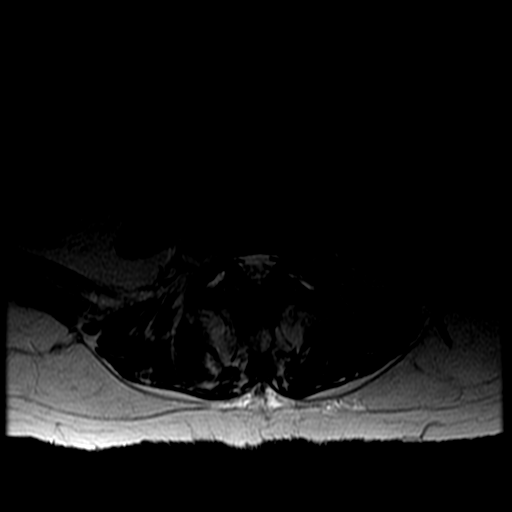
[im 18/38]
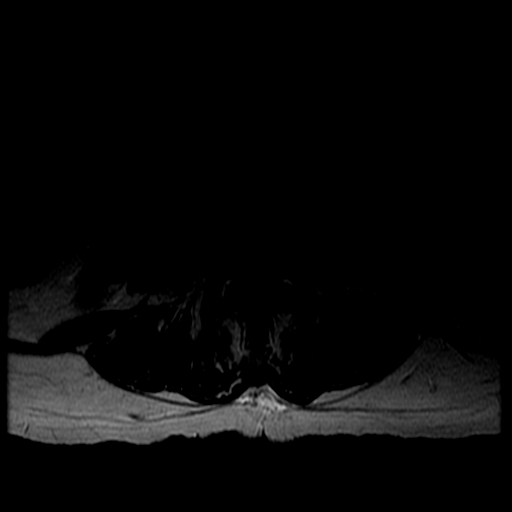
[im 20/38]
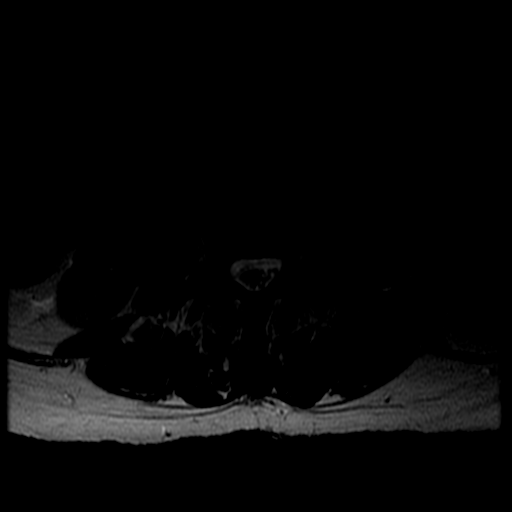
[im 33/38]
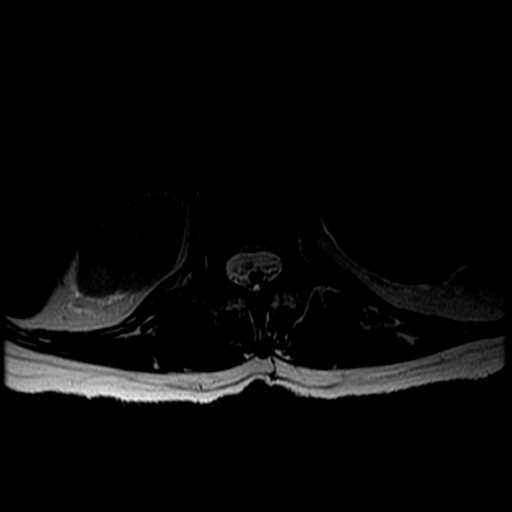

[Series 16: T1 · axial · 4.0mm · 0.39mm/px · z∈[-431,-266]mm · 3 of 38 slices shown (2 of 2)]
[im 5/38]
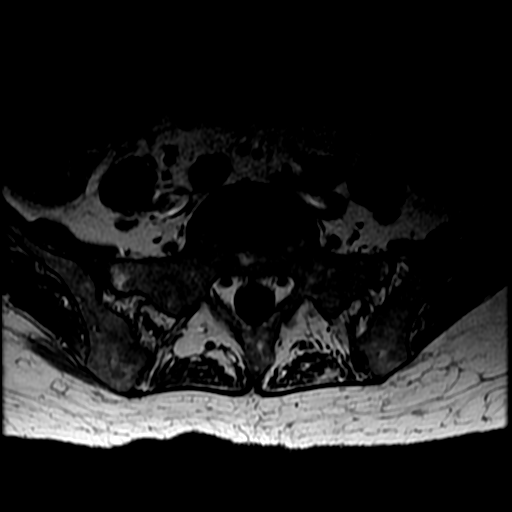
[im 20/38]
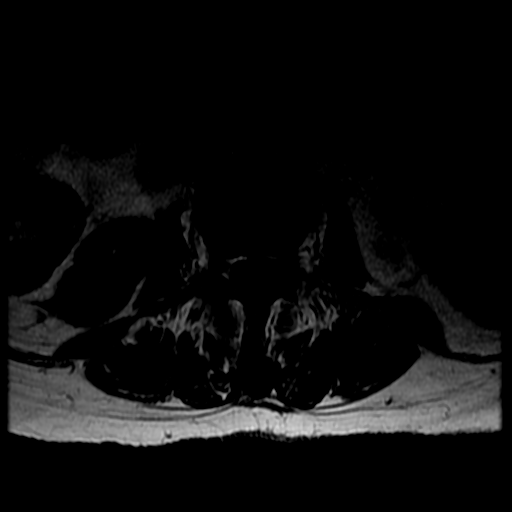
[im 33/38]
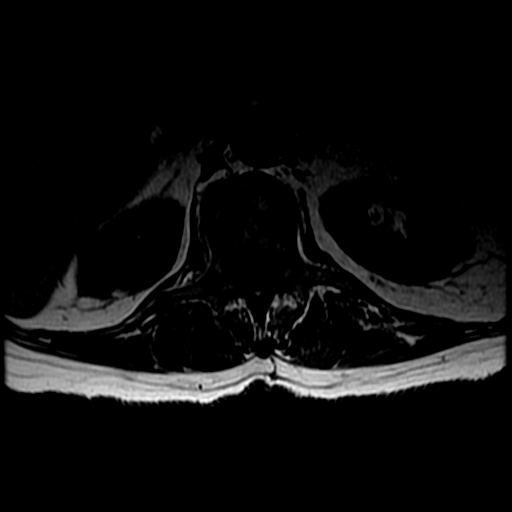

[18 of 48 positions shown; findings below may reference images not displayed]

FINDINGS: MRI THORACIC SPINE FINDINGS

Alignment: Mildly exaggerated kyphosis at T10 fracture. No
listhesis.

Vertebrae: T10 compression fracture which appears new from
04/19/2017 PET CT, known from 05/22/2017 radiography. A fracture
plane is still visible, but no notable marrow edema. Height loss
measures approximately 60%. Minimal posterior cortex bowing.

Appearance of diffuse red marrow in this patient with known lymphoma
and recent staging PET-CT.

Cord:  Normal signal and morphology.

Paraspinal and other soft tissues: No noted paravertebral
inflammation, including at the level of fracture. Known right
paratracheal adenopathy.

Disc levels:

Small central disc protrusions at T6-7 and T7-8. Otherwise usual
degenerative changes. No impingement noted.

MRI LUMBAR SPINE FINDINGS

Segmentation:  Standard.

Alignment: Borderline anterolisthesis at L4-5, that would be facet
mediated.

Vertebrae: Remote and healed superior endplate fractures of T11,
T12, and L4. No acute fracture noted. Prominent red marrow in this
patient with history of lymphoma.

Conus medullaris: Extends to the L1-2 disc level and appears normal.

Paraspinal and other soft tissues: Negative

Disc levels:

L3-4: Disc narrowing and circumferential bulging. Advanced facet
arthropathy with bony and ligamentous hypertrophy and bilateral
joint effusion. Advanced spinal stenosis with complete effacement of
CSF. Mild-to-moderate bilateral foraminal narrowing.

L4-5: Advanced facet arthropathy with fluid in the gaping joints and
ligamentum flavum thickening. Circumferential disc bulging. Patent
canal and foramina

L5-S1: Mild facet spurring.  No herniation or impingement
IMPRESSION: MR THORACIC SPINE IMPRESSION

1. Non-acute T10 compression fracture with 
60% height loss. An
unhealed fracture plane is still visible; surrounding marrow edema
is minimal.
2. Remote T11 and T12 superior endplate fractures.
3. No impingement.

MR LUMBAR SPINE IMPRESSION

1. L3-4 advanced degenerative spinal stenosis.
2. L4-5 advanced facet arthropathy.
3. Remote L4 superior endplate fracture.

## 2017-09-08 ENCOUNTER — Other Ambulatory Visit (INDEPENDENT_AMBULATORY_CARE_PROVIDER_SITE_OTHER): Payer: Medicare Other

## 2017-09-08 DIAGNOSIS — R5383 Other fatigue: Secondary | ICD-10-CM

## 2017-09-08 DIAGNOSIS — Z862 Personal history of diseases of the blood and blood-forming organs and certain disorders involving the immune mechanism: Secondary | ICD-10-CM

## 2017-09-08 DIAGNOSIS — E038 Other specified hypothyroidism: Secondary | ICD-10-CM

## 2017-09-08 DIAGNOSIS — E538 Deficiency of other specified B group vitamins: Secondary | ICD-10-CM

## 2017-09-08 DIAGNOSIS — I10 Essential (primary) hypertension: Secondary | ICD-10-CM

## 2017-09-08 DIAGNOSIS — E7800 Pure hypercholesterolemia, unspecified: Secondary | ICD-10-CM

## 2017-09-09 ENCOUNTER — Other Ambulatory Visit: Payer: Self-pay | Admitting: Internal Medicine

## 2017-09-09 DIAGNOSIS — Z1231 Encounter for screening mammogram for malignant neoplasm of breast: Secondary | ICD-10-CM

## 2017-09-09 LAB — HEMOGLOBIN A1C
Est. average glucose Bld gHb Est-mCnc: 126 mg/dL
Hgb A1c MFr Bld: 6 % — ABNORMAL HIGH (ref 4.8–5.6)

## 2017-09-09 LAB — COMPREHENSIVE METABOLIC PANEL
ALT: 15 IU/L (ref 0–32)
AST: 26 IU/L (ref 0–40)
Albumin/Globulin Ratio: 1.6 (ref 1.2–2.2)
Albumin: 4.5 g/dL (ref 3.5–4.7)
Alkaline Phosphatase: 78 IU/L (ref 39–117)
BUN/Creatinine Ratio: 18 (ref 12–28)
BUN: 21 mg/dL (ref 8–27)
Bilirubin Total: 0.5 mg/dL (ref 0.0–1.2)
CO2: 23 mmol/L (ref 20–29)
Calcium: 10.2 mg/dL (ref 8.7–10.3)
Chloride: 103 mmol/L (ref 96–106)
Creatinine, Ser: 1.17 mg/dL — ABNORMAL HIGH (ref 0.57–1.00)
GFR calc Af Amer: 50 mL/min/{1.73_m2} — ABNORMAL LOW (ref >59)
GFR calc non Af Amer: 44 mL/min/{1.73_m2} — ABNORMAL LOW (ref >59)
Globulin, Total: 2.8 g/dL (ref 1.5–4.5)
Glucose: 112 mg/dL — ABNORMAL HIGH (ref 65–99)
Potassium: 4.8 mmol/L (ref 3.5–5.2)
Sodium: 145 mmol/L — ABNORMAL HIGH (ref 134–144)
Total Protein: 7.3 g/dL (ref 6.0–8.5)

## 2017-09-09 LAB — CBC WITH DIFFERENTIAL
Basophils Absolute: 0.1 10*3/uL (ref 0.0–0.2)
Basos: 2 %
EOS (ABSOLUTE): 0.9 10*3/uL — ABNORMAL HIGH (ref 0.0–0.4)
Eos: 14 %
Hematocrit: 39.1 % (ref 34.0–46.6)
Hemoglobin: 13.1 g/dL (ref 11.1–15.9)
Immature Grans (Abs): 0 10*3/uL (ref 0.0–0.1)
Immature Granulocytes: 0 %
Lymphocytes Absolute: 1.4 10*3/uL (ref 0.7–3.1)
Lymphs: 22 %
MCH: 28.7 pg (ref 26.6–33.0)
MCHC: 33.5 g/dL (ref 31.5–35.7)
MCV: 86 fL (ref 79–97)
Monocytes Absolute: 0.5 10*3/uL (ref 0.1–0.9)
Monocytes: 9 %
Neutrophils Absolute: 3.4 10*3/uL (ref 1.4–7.0)
Neutrophils: 53 %
RBC: 4.56 x10E6/uL (ref 3.77–5.28)
RDW: 14.6 % (ref 12.3–15.4)
WBC: 6.4 10*3/uL (ref 3.4–10.8)

## 2017-09-09 LAB — VITAMIN D 25 HYDROXY (VIT D DEFICIENCY, FRACTURES): Vit D, 25-Hydroxy: 42.4 ng/mL (ref 30.0–100.0)

## 2017-09-09 LAB — LIPID PANEL
Chol/HDL Ratio: 4.2 ratio (ref 0.0–4.4)
Cholesterol, Total: 250 mg/dL — ABNORMAL HIGH (ref 100–199)
HDL: 59 mg/dL (ref >39)
LDL Calculated: 170 mg/dL — ABNORMAL HIGH (ref 0–99)
Triglycerides: 107 mg/dL (ref 0–149)
VLDL Cholesterol Cal: 21 mg/dL (ref 5–40)

## 2017-09-09 LAB — T4, FREE: Free T4: 1.26 ng/dL (ref 0.82–1.77)

## 2017-09-09 LAB — VITAMIN B12: Vitamin B-12: 404 pg/mL (ref 232–1245)

## 2017-09-09 LAB — TSH: TSH: 0.869 u[IU]/mL (ref 0.450–4.500)

## 2017-09-13 NOTE — Progress Notes (Signed)
Patient ID: Kim Lane, female   DOB: [birth year], 83 y.o.   MRN: [id number]   Kim Lane is seen today in followup for her coronary artery disease. Had high risk myovue done for  preop shoulder surgery also issues with T10 fracuture  ? Pathologic from [facility]  lymphoma and needed back surgery with Dr Rolena Infante  [facility]  06/10/17 reviewed and deemed high risk Ordered for preop clearance    Nuclear stress EF: 58%.  There was less than 38mm of horizontal ST segment depression in the inferolateral leads during infusion.  There is a medium defect of moderate severity present in the basal anterolateral, mid anterolateral and apical lateral location. The defect is reversible and consistent with ischemia.  Findings consistent with ischemia.  The left ventricular ejection fraction is normal (55-65%).  This is a high risk study.   Cath done by Dr Burt Knack 06/16/17 films reviewed Conclusion   1. Severe three-vessel coronary artery disease with:  Severe mid LAD and first diagonal stenosis with continued patency of the stented segment in the mid LAD  Severe ostial left circumflex and first OM stenoses  Severe proximal RCA and posterior AV segment stenoses  2. Normal LV function by nuclear assessment   Discussed with patient and opted to Rx medically and take risk of surgery for pain relief and quality of life issues. Done by Dr Rolena Infante 06/24/17 with no peri op cardiac complications  Doing rehab for back at [facility] and loves it. No chest pains Had PeT scan recently and no tumor    ROS: Denies fever, malais, weight loss, blurry vision, decreased visual acuity, cough, sputum, SOB, hemoptysis, pleuritic pain, palpitaitons, heartburn, abdominal pain, melena, lower extremity edema, claudication, or rash.  All other systems reviewed and negative  General: Affect appropriate Frail elderly female  HEENT: normal Neck supple with no adenopathy JVP normal no bruits no thyromegaly Lungs clear with no  wheezing and good diaphragmatic motion Heart:  S1/S2 SEM  murmur, no rub, gallop or click PMI normal Abdomen: benighn, BS positve, no tenderness, no AAA no bruit.  No HSM or HJR Distal pulses intact with no bruits No edema Neuro non-focal Skin warm and dry No muscular weakness    Current Outpatient Prescriptions  Medication Sig Dispense Refill  . aspirin 81 MG tablet Take 81 mg by mouth daily.      . Coenzyme Q10 100 MG capsule Take 100 mg by mouth daily.     . iron polysaccharides (NIFEREX) 150 MG capsule Take 1 capsule (150 mg total) by mouth daily. 30 capsule 1  . lactose free nutrition (BOOST PLUS) LIQD Take 237 mLs by mouth 3 (three) times daily with meals. 30 Can 0  . levothyroxine (SYNTHROID, LEVOTHROID) 88 MCG tablet Take 88 mcg by mouth daily before breakfast.     . metoprolol succinate (TOPROL-XL) 25 MG 24 hr tablet Take 12.5 mg by mouth every other day.     . Multiple Vitamins-Minerals (CENTRUM SILVER PO) Take 1 tablet by mouth daily.      . nitroGLYCERIN (NITROLINGUAL) 0.4 MG/SPRAY spray Place 1 spray under the tongue every 5 (five) minutes x 3 doses as needed for chest pain (3 sprays max).    . Omega-3 Fatty Acids (FISH OIL) 1000 MG CAPS Take 1 capsule by mouth daily.     . ondansetron (ZOFRAN ODT) 4 MG disintegrating tablet Take 1 tablet (4 mg total) by mouth every 8 (eight) hours as needed for nausea or vomiting. 20 tablet 0  .  oxyCODONE-acetaminophen (ROXICET) 5-325 MG tablet Take 1 tablet by mouth every 6 (six) hours as needed. 28 tablet 0  . PROAIR HFA 108 (90 Base) MCG/ACT inhaler Inhale 2 puffs into the lungs every 4 (four) hours as needed for shortness of breath.     . RESTASIS 0.05 % ophthalmic emulsion Place 2 drops into both eyes 2 (two) times daily. For dry eyes    . traZODone (DESYREL) 50 MG tablet Take 0.5-1 tablets (25-50 mg total) by mouth at bedtime as needed for sleep. 30 tablet 0  . triamcinolone cream (KENALOG) 0.1 % Apply 1 application topically daily as  needed.      No current facility-administered medications for this visit.     Allergies  Fosamax [alendronate sodium]  Electrocardiogram:  12/3/ 2015 SR rate 63 normal ECG no change from 2014  05/25/16  NSR rate 79  Normal eCG   Assessment and Plan CAD: Severe 3VD no angina no complications recent general anesthesia and surgery continue medical Rx and consider intervention only for unstable agnina give age and co morbidities   Thyroid  Continue replacement TSH normal labs with primary   TIA:  Non focal neuro exam for me.  Echo with bubble study negative  ECG totally normal Monitor unrevealing and patient defers LINQ impant at this time  Lymphoma:  Has had chemo and resultant anemia needing transfusion PET scan reassuring f/u oncology  Back:  Continue 4 more weeks of rehab not wearing brace f/u Dr Rolena Infante improved   [name]

## 2017-09-15 ENCOUNTER — Ambulatory Visit (INDEPENDENT_AMBULATORY_CARE_PROVIDER_SITE_OTHER): Payer: Medicare Other | Admitting: Cardiovascular Disease

## 2017-09-15 ENCOUNTER — Encounter: Payer: Self-pay | Admitting: Cardiovascular Disease

## 2017-09-15 VITALS — BP 112/60 | HR 76 | Ht 64.0 in | Wt 120.1 lb

## 2017-09-15 DIAGNOSIS — I2510 Atherosclerotic heart disease of native coronary artery without angina pectoris: Secondary | ICD-10-CM

## 2017-09-15 NOTE — Patient Instructions (Signed)

## 2017-09-16 DIAGNOSIS — M1990 Unspecified osteoarthritis, unspecified site: Secondary | ICD-10-CM | POA: Insufficient documentation

## 2017-09-20 ENCOUNTER — Telehealth: Payer: Self-pay

## 2017-09-20 MED ORDER — VITAMIN D (ERGOCALCIFEROL) 1.25 MG (50000 UNIT) PO CAPS: 50000.0000 [IU] | ORAL_CAPSULE | ORAL | 3 refills | Status: DC

## 2017-09-20 NOTE — Telephone Encounter (Signed)
-----   Message from [name], DO sent at 09/17/2017  7:57 AM EDT ----- send in a prescription Prescription for 50,000 IUs vitamin D with 10 refills, she should be taking 1200 of calcium daily.

## 2017-10-07 ENCOUNTER — Other Ambulatory Visit (HOSPITAL_BASED_OUTPATIENT_CLINIC_OR_DEPARTMENT_OTHER): Payer: Medicare Other

## 2017-10-07 ENCOUNTER — Telehealth: Payer: Self-pay

## 2017-10-07 ENCOUNTER — Encounter: Payer: Self-pay | Admitting: Hematology

## 2017-10-07 ENCOUNTER — Ambulatory Visit (HOSPITAL_BASED_OUTPATIENT_CLINIC_OR_DEPARTMENT_OTHER): Payer: Medicare Other

## 2017-10-07 ENCOUNTER — Ambulatory Visit (HOSPITAL_BASED_OUTPATIENT_CLINIC_OR_DEPARTMENT_OTHER): Payer: Medicare Other | Admitting: Hematology

## 2017-10-07 VITALS — BP 143/76 | HR 66 | Temp 98.0°F | Resp 18 | Ht 64.0 in | Wt 119.8 lb

## 2017-10-07 DIAGNOSIS — M810 Age-related osteoporosis without current pathological fracture: Secondary | ICD-10-CM | POA: Diagnosis not present

## 2017-10-07 DIAGNOSIS — C8111 Nodular sclerosis classical Hodgkin lymphoma, lymph nodes of head, face, and neck: Secondary | ICD-10-CM

## 2017-10-07 DIAGNOSIS — Z95828 Presence of other vascular implants and grafts: Secondary | ICD-10-CM

## 2017-10-07 DIAGNOSIS — M4854XA Collapsed vertebra, not elsewhere classified, thoracic region, initial encounter for fracture: Secondary | ICD-10-CM | POA: Diagnosis not present

## 2017-10-07 DIAGNOSIS — R21 Rash and other nonspecific skin eruption: Secondary | ICD-10-CM

## 2017-10-07 DIAGNOSIS — D509 Iron deficiency anemia, unspecified: Secondary | ICD-10-CM

## 2017-10-07 DIAGNOSIS — I2510 Atherosclerotic heart disease of native coronary artery without angina pectoris: Secondary | ICD-10-CM

## 2017-10-07 DIAGNOSIS — L299 Pruritus, unspecified: Secondary | ICD-10-CM | POA: Diagnosis not present

## 2017-10-07 LAB — CBC & DIFF AND RETIC
BASO%: 2.1 % — ABNORMAL HIGH (ref 0.0–2.0)
Basophils Absolute: 0.1 10*3/uL (ref 0.0–0.1)
EOS%: 9.7 % — ABNORMAL HIGH (ref 0.0–7.0)
Eosinophils Absolute: 0.6 10*3/uL — ABNORMAL HIGH (ref 0.0–0.5)
HCT: 35.9 % (ref 34.8–46.6)
HGB: 11.6 g/dL (ref 11.6–15.9)
Immature Retic Fract: 4.6 % (ref 1.60–10.00)
LYMPH%: 22.5 % (ref 14.0–49.7)
MCH: 28.4 pg (ref 25.1–34.0)
MCHC: 32.3 g/dL (ref 31.5–36.0)
MCV: 88 fL (ref 79.5–101.0)
MONO#: 0.5 10*3/uL (ref 0.1–0.9)
MONO%: 7.9 % (ref 0.0–14.0)
NEUT#: 3.5 10*3/uL (ref 1.5–6.5)
NEUT%: 57.8 % (ref 38.4–76.8)
Platelets: 333 10*3/uL (ref 145–400)
RBC: 4.08 10*6/uL (ref 3.70–5.45)
RDW: 15 % — ABNORMAL HIGH (ref 11.2–14.5)
Retic %: 0.92 % (ref 0.70–2.10)
Retic Ct Abs: 37.54 10*3/uL (ref 33.70–90.70)
WBC: 6.1 10*3/uL (ref 3.9–10.3)
lymph#: 1.4 10*3/uL (ref 0.9–3.3)

## 2017-10-07 LAB — COMPREHENSIVE METABOLIC PANEL
ALT: 17 U/L (ref 0–55)
AST: 24 U/L (ref 5–34)
Albumin: 3.8 g/dL (ref 3.5–5.0)
Alkaline Phosphatase: 63 U/L (ref 40–150)
Anion Gap: 9 mEq/L (ref 3–11)
BUN: 18.5 mg/dL (ref 7.0–26.0)
CO2: 25 mEq/L (ref 22–29)
Calcium: 9.2 mg/dL (ref 8.4–10.4)
Chloride: 105 mEq/L (ref 98–109)
Creatinine: 0.9 mg/dL (ref 0.6–1.1)
EGFR: >60 mL/min/{1.73_m2} (ref >60)
Glucose: 103 mg/dl (ref 70–140)
Potassium: 4.3 mEq/L (ref 3.5–5.1)
Sodium: 139 mEq/L (ref 136–145)
Total Bilirubin: 0.72 mg/dL (ref 0.20–1.20)
Total Protein: 6.7 g/dL (ref 6.4–8.3)

## 2017-10-07 MED ORDER — HEPARIN SOD (PORK) LOCK FLUSH 100 UNIT/ML IV SOLN
500.0000 [IU] | Freq: Once | INTRAVENOUS | Status: AC | PRN
  Administered 2017-10-07: 500 [IU] via INTRAVENOUS
  Filled 2017-10-07: qty 5

## 2017-10-07 MED ORDER — SODIUM CHLORIDE 0.9% FLUSH
10.0000 mL | INTRAVENOUS | Status: DC | PRN
  Administered 2017-10-07: 10 mL via INTRAVENOUS
  Filled 2017-10-07: qty 10

## 2017-10-07 NOTE — Progress Notes (Signed)
[name] Kitchen    HEMATOLOGY/ONCOLOGY CLINIC NOTE  Date of Service: 10/07/17  Patient Care Team: [name], DO as PCP - General (Family Medicine) Josue Hector, MD as Consulting Physician (Cardiology) Penni Bombard, MD as Consulting Physician (Neurology) Jarome Matin, MD as Consulting Physician (Dermatology) Rozetta Nunnery, MD as Consulting Physician (Otolaryngology) Brunetta Genera, MD as Consulting Physician (Hematology) Melina Schools, MD as Consulting Physician (Orthopedic Surgery) Josue Hector, MD as Consulting Physician (Cardiology)  CHIEF COMPLAINTS/PURPOSE OF CONSULTATION:   f/u for continued treatment of Hodgkin's lymphoma  HISTORY OF PRESENTING ILLNESS:   plz see previous   INTERVAL HISTORY  Patient is here for routine follow up. The patient reports that she did have some pruritis again to her bilateral wrists and foot which has returned. This has been bothersome and has caused her to wake up at night intermittently. She denies any h/o allergies. She did suffer from a fall around the beginning of July, she subsequently underwent T10 kyphoplasty for a compression fracture on on 06/29/17. She has been steadily improving with physical therapy and is set to be discharged from this next week. She has been taking Oxycodone for continued right shoulder and lower back pain and has been attempting to wean herself off of this medication and supplement with Tylenol. She continues to hold her weight around 119-120lbs. She is able to eat well and her appetite has been good. She also endorses some right lower lateral rib pain which has been ongoing for several days. This does radiate into her back, but her pain is mild and manageable. She denies any rash over the area.    She is otherwise without acute complaint or concern and is doing very well. I did discuss with her about removing her port, she states that she would like to have another flush in six weeks and she would to  keep it in until then.    MEDICAL HISTORY:  Past Medical History:  Diagnosis Date  . Anemia   . Anginal pain ([place])    occ  . Blood dyscrasia 12/2016   hodgins lymphoma tx-remission  . CAD (coronary artery disease) 02/2006   Taxus stent placed to LAD and Diagonal per Dr. Olevia Perches  . Cataracts, bilateral   . Complication of anesthesia   . HTN (hypertension)   . Hypercholesterolemia   . Hypothyroidism   . IBS (irritable bowel syndrome)   . Osteoarthritis   . PONV (postoperative nausea and vomiting)   . Stroke [facility]) 03/2016  Osteoporosis Cardiologist Dr. [name] -Dr. Thana Farr  SURGICAL HISTORY: Past Surgical History:  Procedure Laterality Date  . APPENDECTOMY    . IR GENERIC HISTORICAL  01/25/2017   IR US GUIDE VASC ACCESS RIGHT 01/25/2017 [name], MD [facility]  . IR GENERIC HISTORICAL  01/25/2017   IR FLUORO GUIDE PORT INSERTION RIGHT 01/25/2017 [name], MD [facility]  . KYPHOPLASTY N/A 06/24/2017   Procedure: KYPHOPLASTY T10;  Surgeon: Melina Schools, MD;  Location: [place];  Service: Orthopedics;  Laterality: N/A;  90 mins  . LEFT HEART CATH AND CORONARY ANGIOGRAPHY N/A 06/16/2017   Procedure: Left Heart Cath and Coronary Angiography;  Surgeon: Sherren Mocha, MD;  Location: [place] CV LAB;  Service: Cardiovascular;  Laterality: N/A;  . MASS EXCISION Right 12/22/2016   Procedure: RIGHT NECK LYMPH NODE BIOPSY;  Surgeon: Rozetta Nunnery, MD;  Location: [place];  Service: ENT;  Laterality: Right;  . SPINE SURGERY    . stents  in heart  . TONSILLECTOMY    . TOTAL KNEE ARTHROPLASTY Bilateral 2011,2012  . VAGINAL HYSTERECTOMY      SOCIAL HISTORY: Social History   Social History  . Marital status: Widowed    Spouse name: N/A  . Number of children: 2  . Years of education: 12   Occupational History  .  [employer]   Social History Main Topics  . Smoking status: Former Research scientist (life sciences)  . Smokeless tobacco:  Never Used     Comment: quit smoking 30 years ago, very light smoker  . Alcohol use No  . Drug use: No  . Sexual activity: Not on file   Other Topics Concern  . Not on file   Social History Narrative   Lives alone   caffeine use- coffee- 5 cups daily    FAMILY HISTORY: Family History  Problem Relation Age of Onset  . Diabetes Mother   . Dementia Mother   . ALS Mother   . Heart Problems Father   . Liver disease Sister   . Cirrhosis Sister   . Heart block Unknown        CABG  . Diabetes Sister   . Heart block Son        CABG  . Other [place]  . Other Daughter        [place]  . Dementia Sister     ALLERGIES:  is allergic to fosamax [alendronate sodium].  MEDICATIONS:  Current Outpatient Prescriptions  Medication Sig Dispense Refill  . aspirin 81 MG tablet Take 81 mg by mouth daily.      . Coenzyme Q10 100 MG capsule Take 100 mg by mouth daily.     . iron polysaccharides (NIFEREX) 150 MG capsule Take 1 capsule (150 mg total) by mouth daily. 30 capsule 1  . lactose free nutrition (BOOST PLUS) LIQD Take 237 mLs by mouth 3 (three) times daily with meals. 30 Can 0  . levothyroxine (SYNTHROID, LEVOTHROID) 88 MCG tablet Take 88 mcg by mouth daily before breakfast.     . metoprolol succinate (TOPROL-XL) 25 MG 24 hr tablet Take 12.5 mg by mouth every other day.     . Multiple Vitamins-Minerals (CENTRUM SILVER PO) Take 1 tablet by mouth daily.      . nitroGLYCERIN (NITROLINGUAL) 0.4 MG/SPRAY spray Place 1 spray under the tongue every 5 (five) minutes x 3 doses as needed for chest pain (3 sprays max).    . Omega-3 Fatty Acids (FISH OIL) 1000 MG CAPS Take 1 capsule by mouth daily.     . ondansetron (ZOFRAN ODT) 4 MG disintegrating tablet Take 1 tablet (4 mg total) by mouth every 8 (eight) hours as needed for nausea or vomiting. 20 tablet 0  . oxyCODONE-acetaminophen (ROXICET) 5-325 MG tablet Take 1 tablet by mouth every 6 (six) hours as needed. 28 tablet 0    . PROAIR HFA 108 (90 Base) MCG/ACT inhaler Inhale 2 puffs into the lungs every 4 (four) hours as needed for shortness of breath.     . RESTASIS 0.05 % ophthalmic emulsion Place 2 drops into both eyes 2 (two) times daily. For dry eyes    . traZODone (DESYREL) 50 MG tablet Take 0.5-1 tablets (25-50 mg total) by mouth at bedtime as needed for sleep. 30 tablet 0  . triamcinolone cream (KENALOG) 0.1 % Apply 1 application topically daily as needed.     . Vitamin  D, Ergocalciferol, (DRISDOL) 50000 units CAPS capsule Take 1 capsule (50,000 Units total) by mouth every 7 (seven) days. 12 capsule 3   No current facility-administered medications for this visit.     REVIEW OF SYSTEMS:    10 Point review of Systems was done is negative except as noted above.  PHYSICAL EXAMINATION: ECOG PERFORMANCE STATUS: 2 - Symptomatic, <50% confined to bed  Vital signs reviewed in Epic  GENERAL:alert, in no acute distress and comfortable SKIN:Dry skin with some areas with scaly itchy. Not on the face or hands . Predominantly on the upper extremities . Areas of senile purpura . EYES: normal, conjunctiva are pink and non-injected, sclera clear OROPHARYNX:no exudate, no erythema and lips, buccal mucosa, and tongue normal  NECK: supple, no JVD, thyroid normal size, non-tender, without nodularity LYMPH:  no palpable lymphadenopathy in the cervical, axillary or inguinal LUNGS: clear to auscultation with normal respiratory effort HEART: regular rate & rhythm,  no murmurs and no lower extrety edem ABDOMEN: abdomen soft, non-tender, normoactive bowel sounds  Musculoskeletal: no cyanosis of digits and no clubbing  PSYCH: alert & oriented x 3 with fluent speech NEURO: no focal motor/sensory deficits  LABORATORY DATA:  I have reviewed the data as listed   . CBC Latest Ref Rng & Units 10/07/2017 09/08/2017 07/07/2017  WBC 3.9 - 10.3 10e3/uL 6.1 6.4 7.3  Hemoglobin 11.6 - 15.9 g/dL 11.6 13.1 12.1  Hematocrit 34.8 - 46.6  % 35.9 39.1 37.8  Platelets 145 - 400 10e3/uL 333 - 396   . CMP Latest Ref Rng & Units 10/07/2017 09/08/2017 07/07/2017  Glucose 70 - 140 mg/dl 103 112(H) 121  BUN 7.0 - 26.0 mg/dL 18.5 21 15.6  Creatinine 0.6 - 1.1 mg/dL 0.9 1.17(H) 0.9  Sodium 136 - 145 mEq/L 139 145(H) 142  Potassium 3.5 - 5.1 mEq/L 4.3 4.8 5.0  Chloride 96 - 106 mmol/L - 103 -  CO2 22 - 29 mEq/L 25 23 27   Calcium 8.4 - 10.4 mg/dL 9.2 10.2 9.9  Total Protein 6.4 - 8.3 g/dL 6.7 7.3 7.5  Total Bilirubin 0.20 - 1.20 mg/dL 0.72 0.5 0.43  Alkaline Phos 40 - 150 U/L 63 78 106  AST 5 - 34 U/L 24 26 23   ALT 0 - 55 U/L 17 15 17    Sed rate -pending from today  RADIOGRAPHIC STUDIES: I have personally reviewed the radiological images as listed and agreed with the findings in the report. No results found.   Transthoracic Echocardiography  Patient:    Kim, Lane MR #:       [id number] Study Date: 06/11/2016 Gender:     F Age:        83 Height:     162.6 cm Weight:     62 kg BSA:        1.68 m^2 Pt. Status: Room:   Jetty Duhamel, M.D.  REFERRING    [name], M.D.  ATTENDING    Loralie Champagne, M.D.  PERFORMING   [facility], Outpatient  SONOGRAPHER  Bethany McMahill, RDCS  cc:  ------------------------------------------------------------------- LV EF: 60% -   65%  ------------------------------------------------------------------- Indications:      TIA (G45.9).  ------------------------------------------------------------------- History:   PMH:   Coronary artery disease.  Stroke.  Risk factors: Family history of coronary artery disease. Former tobacco use. Hypertension. Dyslipidemia.  ------------------------------------------------------------------- Study Conclusions  - Left ventricle: The cavity size was normal. Wall thickness was   increased in a pattern of mild LVH.  Systolic function was normal.   The estimated ejection fraction was in the range of 60% to 65%.   Wall motion was  normal; there were no regional wall motion   abnormalities. Doppler parameters are consistent with abnormal   left ventricular relaxation (grade 1 diastolic dysfunction).   Normal strain pattern, GLS -22.7%. - Aortic valve: There was no stenosis. - Mitral valve: Mildly calcified annulus. There was no significant   regurgitation. - Left atrium: The atrium was mildly dilated. - Right ventricle: The cavity size was normal. Systolic function   was normal. - Pulmonary arteries: PA peak pressure: 19 mm Hg (S). - Inferior vena cava: The vessel was normal in size. The   respirophasic diameter changes were in the normal range (= 50%),   consistent with normal central venous pressure.  Impressions:  - Normal LV size and systolic function, EF 40-98%. Mild LV   hypertrophy. Normal RV size and systolic function. No significant   valvular abnormalities.   ASSESSMENT & PLAN:   83 y.o. Caucasian female with ECOG performance status of 2 with  1) Classical Hodgkin's lymphoma of nodular sclerosis variety Atleast Stage IIIB some concern for possible Rt lung involvement which make in Stage IVB Presented with right neck lymph nodes . She had type B constitutional symptoms with 15-20 pound weight loss night sweats or chills .noted to have significant pruritus with mild rash likely from Hodgkin's which have all resolved   Wt Readings from Last 3 Encounters:  10/07/17 119 lb 12.8 oz (54.3 kg)  09/15/17 120 lb 1.9 oz (54.5 kg)  07/23/17 115 lb (52.2 kg)  - her constitutional symptoms, pruritus have resolved. Rt neck swelling Appears to have resolved. PET/CT scan after 5 cycles shows marked response to chemotherapy and stable predominantly [place] 2 as per discussion with the radiology.  2) Microcytic anemia - likely anemia of chronic disease due to Hodgkin's lymphoma .  No evidence of iron def. Improved after PRBC transfusion . Stable. Now partly from chemotherapy 3) Neutrophilic leucocytosis -  likely from steroid and Neulasta. No evidence of infection. Now resolved. PLAN - labs stable. No prohibitive toxicities. Sed rate WNL at 20. -patient has no clinical or lab evidence of Hodgkins lymphoma progression/recurrence at this time. -we discussed removal of her port today. She would like to have it removed in the next 2-3 weeks. -order placed.  4) Rash and pruritus  -previously had Sweet syndrome from neulasta- which resolved. Currently has some pruritus without rash from Xeroderma - I informed her to avoid long, hot showers and pat dry to avoid dry skin. I urged her to consider using moisturizing lotions over these areas to aid with this.  -she was also suggested using Sarna Ointment for pruritus  5) T10 compression fracture based on imaging done on 05/22/2017 in ED Now status post kyphoplasty with significant improvement in her back pain . PET/CT scan also shows possible L4 compression fracture . These compression fractures appear to be related to osteoporosis . She was previously on Fosamax that cause generalized body aches . Plan -Continue follow-up with primary care physician to optimize treatment for her osteoporosis. Will need optimization of her vitamin D levels closer to 50-60 with adequate calcium replacement and possible consideration of parenteral bisphosphonates versus Prolia. -Pain management as per primary care physician in orthopedics .  6) recently noted triple-vessel coronary artery disease based on cardiac cath patient is surprisingly asymptomatic. -Continue management as per PCP and cardiology.  7) . Patient Active Problem  List   Diagnosis Date Noted  . Osteoarthritis 09/16/2017  . History of iron deficiency anemia-  by ONC 07/23/2017  . Osteoporosis 07/23/2017  . CAD S/P percutaneous coronary angioplasty 07/02/2017  . Closed compression fracture of L2 lumbar vertebra ([place]) 06/24/2017  . Abnormal nuclear cardiac imaging test 06/16/2017  . Malnutrition of  moderate degree 04/27/2017  . Symptomatic anemia 04/26/2017  . Hodgkin lymphoma ([place]) 04/26/2017  . Port catheter in place 02/02/2017  . Nodular sclerosis Hodgkin lymphoma of lymph nodes of neck ([place]) 01/13/2017  . Dyspnea 09/14/2012  . Epistaxis 06/02/2011  . HYPERCHOLESTEROLEMIA, MIXED 01/08/2009  . Essential hypertension, benign 01/08/2009  . Coronary atherosclerosis- sees Dr. Johnsie Cancel 01/08/2009  . Hypothyroidism 01/08/2009  PLAN -Continue follow-up with primary care physician for management of other chronic medical co-morbidities.  Port removal in 2-3 weeks RTC with Dr [name] in 3 months with labs  All of the patients questions were answered with apparent satisfaction. The patient knows to call the clinic with any problems, questions or concerns.  I spent 20 minutes counseling the patient face to face. The total time spent in the appointment was 45 minutes and more than 50% was on counseling and direct patient cares and multiple phone calls to co-ordinate her cares.    Sullivan Lone MD [place] AAHIVMS [facility] Hematology/Oncology Physician [facility]  (Office):       [phone] (Work cell):  [phone] (Fax):           [phone]

## 2017-10-07 NOTE — Telephone Encounter (Signed)
Printed avs and calender for upcoming appointment. Per 10/18 los

## 2017-10-08 LAB — SEDIMENTATION RATE: Sedimentation Rate-Westergren: 20 mm/hr (ref 0–40)

## 2017-10-20 ENCOUNTER — Ambulatory Visit
Admission: RE | Admit: 2017-10-20 | Discharge: 2017-10-20 | Disposition: A | Discharge status: Home / Self Care | Payer: Medicare Other | Source: Ambulatory Visit | Attending: Internal Medicine | Admitting: Internal Medicine

## 2017-10-20 DIAGNOSIS — Z1231 Encounter for screening mammogram for malignant neoplasm of breast: Secondary | ICD-10-CM

## 2017-10-25 ENCOUNTER — Other Ambulatory Visit: Payer: Self-pay

## 2017-10-25 MED ORDER — POLYSACCHARIDE IRON COMPLEX 150 MG PO CAPS: 150.0000 mg | ORAL_CAPSULE | Freq: Every day | ORAL | 0 refills | Status: DC

## 2017-10-26 ENCOUNTER — Other Ambulatory Visit: Payer: Self-pay | Admitting: Radiology

## 2017-10-28 ENCOUNTER — Encounter (HOSPITAL_COMMUNITY): Payer: Self-pay

## 2017-10-28 ENCOUNTER — Ambulatory Visit (HOSPITAL_COMMUNITY)
Admission: RE | Admit: 2017-10-28 | Discharge: 2017-10-28 | Disposition: A | Discharge status: Home / Self Care | Payer: Medicare Other | Source: Ambulatory Visit | Attending: Hematology | Admitting: Hematology

## 2017-10-28 DIAGNOSIS — E039 Hypothyroidism, unspecified: Secondary | ICD-10-CM | POA: Diagnosis not present

## 2017-10-28 DIAGNOSIS — Z95828 Presence of other vascular implants and grafts: Secondary | ICD-10-CM

## 2017-10-28 DIAGNOSIS — K589 Irritable bowel syndrome without diarrhea: Secondary | ICD-10-CM | POA: Diagnosis not present

## 2017-10-28 DIAGNOSIS — M1990 Unspecified osteoarthritis, unspecified site: Secondary | ICD-10-CM | POA: Diagnosis not present

## 2017-10-28 DIAGNOSIS — Z8673 Personal history of transient ischemic attack (TIA), and cerebral infarction without residual deficits: Secondary | ICD-10-CM | POA: Insufficient documentation

## 2017-10-28 DIAGNOSIS — Z7982 Long term (current) use of aspirin: Secondary | ICD-10-CM | POA: Diagnosis not present

## 2017-10-28 DIAGNOSIS — I2510 Atherosclerotic heart disease of native coronary artery without angina pectoris: Secondary | ICD-10-CM | POA: Diagnosis not present

## 2017-10-28 DIAGNOSIS — E7800 Pure hypercholesterolemia, unspecified: Secondary | ICD-10-CM | POA: Diagnosis not present

## 2017-10-28 DIAGNOSIS — Z452 Encounter for adjustment and management of vascular access device: Secondary | ICD-10-CM | POA: Insufficient documentation

## 2017-10-28 DIAGNOSIS — Z8571 Personal history of Hodgkin lymphoma: Secondary | ICD-10-CM | POA: Diagnosis not present

## 2017-10-28 DIAGNOSIS — C8111 Nodular sclerosis classical Hodgkin lymphoma, lymph nodes of head, face, and neck: Secondary | ICD-10-CM

## 2017-10-28 DIAGNOSIS — I10 Essential (primary) hypertension: Secondary | ICD-10-CM | POA: Diagnosis not present

## 2017-10-28 DIAGNOSIS — Z955 Presence of coronary angioplasty implant and graft: Secondary | ICD-10-CM | POA: Diagnosis not present

## 2017-10-28 HISTORY — PX: IR REMOVAL TUN ACCESS W/ PORT W/O FL MOD SED: IMG2290

## 2017-10-28 LAB — CBC WITH DIFFERENTIAL/PLATELET
Basophils Absolute: 0.1 10*3/uL (ref 0.0–0.1)
Basophils Relative: 2 %
Eosinophils Absolute: 0.5 10*3/uL (ref 0.0–0.7)
Eosinophils Relative: 9 %
HCT: 35.4 % — ABNORMAL LOW (ref 36.0–46.0)
Hemoglobin: 11.5 g/dL — ABNORMAL LOW (ref 12.0–15.0)
Lymphocytes Relative: 21 %
Lymphs Abs: 1.1 10*3/uL (ref 0.7–4.0)
MCH: 28.7 pg (ref 26.0–34.0)
MCHC: 32.5 g/dL (ref 30.0–36.0)
MCV: 88.3 fL (ref 78.0–100.0)
Monocytes Absolute: 0.3 10*3/uL (ref 0.1–1.0)
Monocytes Relative: 6 %
Neutro Abs: 3.3 10*3/uL (ref 1.7–7.7)
Neutrophils Relative %: 62 %
Platelets: 355 10*3/uL (ref 150–400)
RBC: 4.01 MIL/uL (ref 3.87–5.11)
RDW: 15.2 % (ref 11.5–15.5)
WBC: 5.3 10*3/uL (ref 4.0–10.5)

## 2017-10-28 LAB — PROTIME-INR
INR: 0.99
Prothrombin Time: 13 seconds (ref 11.4–15.2)

## 2017-10-28 MED ORDER — MIDAZOLAM HCL 2 MG/2ML IJ SOLN
INTRAMUSCULAR | Status: AC | PRN
  Administered 2017-10-28: 1 mg via INTRAVENOUS

## 2017-10-28 MED ORDER — CEFAZOLIN SODIUM-DEXTROSE 2-4 GM/100ML-% IV SOLN
2.0000 g | INTRAVENOUS | Status: AC
  Administered 2017-10-28: 2 g via INTRAVENOUS

## 2017-10-28 MED ORDER — LIDOCAINE-EPINEPHRINE (PF) 2 %-1:200000 IJ SOLN
INTRAMUSCULAR | Status: AC
  Filled 2017-10-28: qty 20

## 2017-10-28 MED ORDER — LIDOCAINE-EPINEPHRINE (PF) 1 %-1:200000 IJ SOLN
INTRAMUSCULAR | Status: AC | PRN
  Administered 2017-10-28: 20 mL

## 2017-10-28 MED ORDER — SODIUM CHLORIDE 0.9 % IV SOLN: INTRAVENOUS | Status: DC

## 2017-10-28 MED ORDER — LIDOCAINE HCL 1 % IJ SOLN
INTRAMUSCULAR | Status: AC
  Filled 2017-10-28: qty 20

## 2017-10-28 MED ORDER — MIDAZOLAM HCL 2 MG/2ML IJ SOLN
INTRAMUSCULAR | Status: AC
  Filled 2017-10-28: qty 2

## 2017-10-28 MED ORDER — FENTANYL CITRATE (PF) 100 MCG/2ML IJ SOLN
INTRAMUSCULAR | Status: AC | PRN
  Administered 2017-10-28: 50 ug via INTRAVENOUS

## 2017-10-28 MED ORDER — CEFAZOLIN SODIUM-DEXTROSE 2-4 GM/100ML-% IV SOLN
INTRAVENOUS | Status: AC
  Administered 2017-10-28: 2 g via INTRAVENOUS
  Filled 2017-10-28: qty 100

## 2017-10-28 MED ORDER — FENTANYL CITRATE (PF) 100 MCG/2ML IJ SOLN
INTRAMUSCULAR | Status: AC
  Filled 2017-10-28: qty 2

## 2017-10-28 NOTE — H&P (Signed)
Referring Physician(s): Brunetta Genera  Supervising Physician: Arne Cleveland  Patient Status:  Kim Lane OP  Chief Complaint:  "I'm getting my port out"  Subjective: Patient familiar to IR service from prior Port-A-Cath placement on 01/25/17. She has a history of Hodgkin's lymphoma and is status post treatment.  She currently has no clinical or lab evidence of disease progression or recurrence.  She presents today for Port-A-Cath removal.  She denies fever, headache, chest pain, dyspnea, cough, abdominal pain, nausea, vomiting or bleeding.  She does have occasional back pain.  Past Medical History:  Diagnosis Date  . Anemia   . Anginal pain ([place])    occ  . Blood dyscrasia 12/2016   hodgins lymphoma tx-remission  . CAD (coronary artery disease) 02/2006   Taxus stent placed to LAD and Diagonal per Dr. Olevia Perches  . Cataracts, bilateral   . Complication of anesthesia   . HTN (hypertension)   . Hypercholesterolemia   . Hypothyroidism   . IBS (irritable bowel syndrome)   . Osteoarthritis   . PONV (postoperative nausea and vomiting)   . Stroke [facility]) 03/2016   Past Surgical History:  Procedure Laterality Date  . APPENDECTOMY    . IR GENERIC HISTORICAL  01/25/2017   IR US GUIDE VASC ACCESS RIGHT 01/25/2017 [name], MD [facility]  . IR GENERIC HISTORICAL  01/25/2017   IR FLUORO GUIDE PORT INSERTION RIGHT 01/25/2017 [name], MD [facility]  . SPINE SURGERY    . stents     in heart  . TONSILLECTOMY    . TOTAL KNEE ARTHROPLASTY Bilateral 2011,2012  . VAGINAL HYSTERECTOMY       Allergies: Fosamax [alendronate sodium]  Medications: Prior to Admission medications   Medication Sig Start Date End Date Taking? Authorizing Provider  aspirin 81 MG tablet Take 81 mg by mouth daily.     Yes [provider]  Coenzyme Q10 100 MG capsule Take 100 mg by mouth daily.    Yes [provider]  iron polysaccharides (NIFEREX) 150 MG capsule Take 1 capsule (150 mg  total) daily by mouth. 10/25/17  Yes Brunetta Genera, MD  lactose free nutrition (BOOST PLUS) LIQD Take 237 mLs by mouth 3 (three) times daily with meals. 04/27/17  Yes Florencia Reasons, MD  levothyroxine (SYNTHROID, LEVOTHROID) 88 MCG tablet Take 88 mcg by mouth daily before breakfast.  11/04/15  Yes [provider]  Multiple Vitamins-Minerals (CENTRUM SILVER PO) Take 1 tablet by mouth daily.     Yes [provider]  Omega-3 Fatty Acids (FISH OIL) 1000 MG CAPS Take 1 capsule by mouth daily.    Yes [provider]  oxyCODONE-acetaminophen (ROXICET) 5-325 MG tablet Take 1 tablet by mouth every 6 (six) hours as needed. 06/25/17 07/01/18 Yes Mayo, Darla Lesches, PA-C  RESTASIS 0.05 % ophthalmic emulsion Place 2 drops into both eyes 2 (two) times daily. For dry eyes 09/19/15  Yes [provider]  triamcinolone cream (KENALOG) 0.1 % Apply 1 application topically daily as needed.  05/19/16  Yes [provider]  Vitamin D, Ergocalciferol, (DRISDOL) 50000 units CAPS capsule Take 1 capsule (50,000 Units total) by mouth every 7 (seven) days. 09/20/17  Yes Opalski, Deborah, DO  metoprolol succinate (TOPROL-XL) 25 MG 24 hr tablet Take 12.5 mg by mouth every other day.     [provider]  nitroGLYCERIN (NITROLINGUAL) 0.4 MG/SPRAY spray Place 1 spray under the tongue every 5 (five) minutes x 3 doses as needed for chest pain (3  sprays max).    [provider]  ondansetron (ZOFRAN ODT) 4 MG disintegrating tablet Take 1 tablet (4 mg total) by mouth every 8 (eight) hours as needed for nausea or vomiting. 06/24/17   Mayo, Carmen Christina, PA-C  PROAIR HFA 108 [phone] Base) MCG/ACT inhaler Inhale 2 puffs into the lungs every 4 (four) hours as needed for shortness of breath.  10/29/16   [provider]  traZODone (DESYREL) 50 MG tablet Take 0.5-1 tablets (25-50 mg total) by mouth at bedtime as needed for sleep. 04/06/17   Brunetta Genera, MD     Vital  Signs: BP (!) 157/79 (BP Location: Right Arm)   Pulse 67   Temp 97.7 F (36.5 C) (Oral)   Resp 16   SpO2 99%   Physical Exam awake, alert.  Chest clear to auscultation bilaterally.  Clean, intact right chest wall Port-A-Cath.  Heart with regular rate and rhythm.  Abdomen soft, positive bowel sounds, nontender.  No lower extremity edema.  Imaging: No results found.  Labs:  CBC: Recent Labs    05/22/17 1126 06/14/17 0858 06/24/17 0830 07/07/17 0954 09/08/17 0854 10/07/17 1115  WBC 11.8* 11.1*  --  7.3 6.4 6.1  HGB 9.8* 11.9 13.3 12.1 13.1 11.6  HCT 29.8* 37.4 39.0 37.8 39.1 35.9  PLT 287 496*  --  396  --  333    COAGS: Recent Labs    12/30/16 1507 01/25/17 0807 06/14/17 0858  INR 1.2 1.25 1.0  APTT  --  46*  --     BMP: Recent Labs    05/22/17 1126 06/14/17 0858 06/24/17 0830 07/07/17 0954 09/08/17 0854 10/07/17 1115  NA 140 142 140 142 145* 139  K 3.8 4.9 4.1 5.0 4.8 4.3  CL 106 103 104  --  103  --   CO2 28 25  --  27 23 25   GLUCOSE 90 93 111* 121 112* 103  BUN 24* 12 17 15.6 21 18.5  CALCIUM 8.9 9.5  --  9.9 10.2 9.2  CREATININE 0.69 0.86 0.80 0.9 1.17* 0.9  GFRNONAA >60 64  --   --  44*  --   GFRAA >60 73  --   --  50*  --     LIVER FUNCTION TESTS: Recent Labs    05/22/17 1126 07/07/17 0954 09/08/17 0854 10/07/17 1115  BILITOT 0.5 0.43 0.5 0.72  AST 21 23 26 24   ALT 18 17 15 17   ALKPHOS 97 106 78 63  PROT 6.0* 7.5 7.3 6.7  ALBUMIN 3.5 4.1 4.5 3.8    Assessment and Plan: Pt with history of Hodgkin's lymphoma , status post treatment.  She currently has no clinical or lab evidence of disease progression or recurrence.  She presents today for Port-A-Cath removal.  Details/risks of procedure, including but not limited to, internal bleeding, infection, injury to adjacent structures, discussed with patient with her understanding and consent. Labs pend.   Electronically Signed: D. Rowe Robert, PA-C 10/28/2017, 11:50 AM   I spent a total  of 20 minutes at the the patient's bedside AND on the patient's hospital floor or unit, greater than 50% of which was counseling/coordinating care for Port-A-Cath removal

## 2017-10-28 NOTE — Procedures (Signed)
  Procedure: R IJ port REMOVAL   Preprocedure diagnosis: Lymphoma Postprocedure diagnosis: same EBL:   minimal Complications:  none immediate  See full dictation in [facility].  Dillard Cannon MD Main # [phone] Pager  [phone]

## 2017-10-28 NOTE — Discharge Instructions (Signed)
Sutured Wound Care Sutures are stitches that can be used to close wounds. Taking care of your wound properly can help prevent pain and infection. It can also help your wound to heal more quickly. How is this treated? Wound Care  Keep the wound clean and dry.  If you were given a bandage (dressing), change it at least one time per day or as told by your doctor. You should also change it if it gets wet or dirty.  Keep the wound completely dry for the first 24 hours or as told by your doctor. After that time, you may shower or bathe. However, make sure that the wound is not soaked in water until the sutures have been removed.  Clean the wound one time each day or as told by your doctor. ? Wash the wound with soap and water. ? Rinse the wound with water to remove all soap. ? Pat the wound dry with a clean towel. Do not rub the wound.  After cleaning the wound, put a thin layer of antibiotic ointment on it as told your doctor. This ointment: ? Helps to prevent infection. ? Keeps the bandage from sticking to the wound.  Have the sutures removed as told by your doctor. General Instructions  Take or apply medicines only as told by your doctor.  To help prevent scarring, make sure to cover your wound with sunscreen whenever you are outside after the sutures are removed and the wound is healed. Make sure to wear a sunscreen of at least 30 SPF.  If you were prescribed an antibiotic medicine or ointment, finish all of it even if you start to feel better.  Do not scratch or pick at the wound.  Keep all follow-up visits as told by your doctor. This is important.  Check your wound every day for signs of infection. Watch for: ? Redness, swelling, or pain. ? Fluid, blood, or pus.  Raise (elevate) the injured area above the level of your heart while you are sitting or lying down, if possible.  Avoid stretching your wound.  Drink enough fluids to keep your pee (urine) clear or pale  yellow. Contact a doctor if:  You were given a tetanus shot and you have any of these where the needle went in: ? Swelling. ? Very bad pain. ? Redness. ? Bleeding.  You have a fever.  A wound that was closed breaks open.  You notice a bad smell coming from the wound.  You notice something coming out of the wound, such as wood or glass.  Medicine does not help your pain.  You have any of these at the site of the wound. ? More redness. ? More swelling. ? More pain.  You have any of these coming from the wound. ? Fluid. ? Blood. ? Pus.  You notice a change in the color of your skin near the wound.  You need to change the bandage often due to fluid, blood, or pus coming from the wound.  You have a new rash.  You have numbness around the wound. Get help right away if:  You have very bad swelling around the wound.  Your pain suddenly gets worse and is very bad.  You have painful lumps near the wound or on skin that is anywhere on your body.  You have a red streak going away from the wound.  The wound is on your hand or foot and you cannot move a finger or toe like normal.  The   wound is on your hand or foot and you notice that your fingers or toes look pale or bluish. This information is not intended to replace advice given to you by your health care provider. Make sure you discuss any questions you have with your health care provider. Document Released: 05/25/2008 Document Revised: 05/14/2016 Document Reviewed: 07/19/2013 [employer] Interactive Patient Education  2017 [employer]  

## 2017-11-23 ENCOUNTER — Ambulatory Visit: Payer: Medicare Other | Admitting: Adult Health

## 2017-11-23 ENCOUNTER — Other Ambulatory Visit: Payer: Self-pay

## 2017-11-23 MED ORDER — POLYSACCHARIDE IRON COMPLEX 150 MG PO CAPS: 150.0000 mg | ORAL_CAPSULE | Freq: Every day | ORAL | 0 refills | Status: DC

## 2017-11-24 ENCOUNTER — Ambulatory Visit (INDEPENDENT_AMBULATORY_CARE_PROVIDER_SITE_OTHER): Payer: Medicare Other | Admitting: Family Medicine

## 2017-11-24 ENCOUNTER — Encounter: Payer: Self-pay | Admitting: Family Medicine

## 2017-11-24 ENCOUNTER — Telehealth: Payer: Self-pay | Admitting: Family Medicine

## 2017-11-24 VITALS — BP 133/76 | HR 90 | Temp 98.4°F | Ht 64.0 in | Wt 121.0 lb

## 2017-11-24 DIAGNOSIS — M810 Age-related osteoporosis without current pathological fracture: Secondary | ICD-10-CM

## 2017-11-24 DIAGNOSIS — I2510 Atherosclerotic heart disease of native coronary artery without angina pectoris: Secondary | ICD-10-CM | POA: Diagnosis not present

## 2017-11-24 DIAGNOSIS — I10 Essential (primary) hypertension: Secondary | ICD-10-CM | POA: Diagnosis not present

## 2017-11-24 DIAGNOSIS — C8190 Hodgkin lymphoma, unspecified, unspecified site: Secondary | ICD-10-CM

## 2017-11-24 DIAGNOSIS — E038 Other specified hypothyroidism: Secondary | ICD-10-CM

## 2017-11-24 DIAGNOSIS — E559 Vitamin D deficiency, unspecified: Secondary | ICD-10-CM | POA: Diagnosis not present

## 2017-11-24 DIAGNOSIS — R7302 Impaired glucose tolerance (oral): Secondary | ICD-10-CM | POA: Diagnosis not present

## 2017-11-24 MED ORDER — CARVEDILOL 3.125 MG PO TABS: 3.1250 mg | ORAL_TABLET | Freq: Two times a day (BID) | ORAL | 0 refills | Status: DC

## 2017-11-24 NOTE — Progress Notes (Signed)
Impression and Recommendations:    1. Essential hypertension, benign   2. Atherosclerosis of native coronary artery of native heart without angina pectoris   3. Other specified hypothyroidism   4. Osteoporosis, unspecified osteoporosis type, unspecified pathological fracture presence   5. Vitamin D deficiency   6. Glucose intolerance (impaired glucose tolerance)   7. Hodgkin lymphoma, unspecified Hodgkin lymphoma type, unspecified body region ([place])    - txmnt per Onc -Thyroid levels look great.  She will continue current dose. - Bp - stable, but patient would rather be on a medicine she takes daily then every other day to help her remember and create more consistency in her life.  We will DC metoprolol and start low-dose carvedilol.  She will monitor at home and we will see her in 4 weeks to see how she is doing -Next office visit when you come in we can recheck your vitamin D then as well as your A1c.  Goal- to walk around [employer] with friends at least 3 d/wk --- over time working her way up to 17mn each time she walks -A1c 1 year ago was 6.4 but approximately a month or 2 ago it was 6.0 and much improved.  Patient will try to work on cutting back on sweets and trying to move more  Meds ordered this encounter  Medications  . carvedilol (COREG) 3.125 MG tablet    Sig: Take 1 tablet (3.125 mg total) by mouth 2 (two) times daily with a meal. 1/2 tab po q am    Dispense:  30 tablet    Refill:  0    Medications Discontinued During This Encounter  Medication Reason  . ondansetron (ZOFRAN ODT) 4 MG disintegrating tablet Completed Course  . triamcinolone cream (KENALOG) 0.1 % Completed Course  . metoprolol succinate (TOPROL-XL) 25 MG 24 hr tablet    The patient was counseled, risk factors were discussed, anticipatory guidance given.  Gross side effects, risk and benefits, and alternatives of medications and treatment plan in general discussed with patient.  Patient is  aware that all medications have potential side effects and we are unable to predict every side effect or drug-drug interaction that may occur.   Patient will call with any questions prior to using medication if they have concerns.  Expresses verbal understanding and consents to current therapy and treatment regimen.  No barriers to understanding were identified.  Red flag symptoms and signs discussed in detail.  Patient expressed understanding regarding what to do in case of emergency\urgent symptoms  Please see AVS handed out to patient at the end of our visit for further patient instructions/ counseling done pertaining to today's office visit.   Return in about 4 weeks (around 12/22/2017) for Since changed BP meds.  Please bring in log from home, will check vitamin D and A1c.     Note: This document was prepared using Dragon voice recognition software and may include unintentional dictation errors.  Clydine Parkison 9:22 AM --------------------------------------------------------------------------------------------------------------------------------------------------------------------------------------------------------------------------------------------    Subjective:    CC:  Chief Complaint  Patient presents with  . Follow-up    HPI: NSYVILLA MARTINis a 83y.o. female [facility].   Second OV with me.   Changing docs to be closer to home and also- daughter went with her to her past PCP-   Sees heart Doc once yrly or so.   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   [place]    Exercise:  Sitting a lot of the time.   Her friends go to [employer] to walk and She plans on going w them in future.   - She is currently undergoing treatment for her Hodgkin's lymphoma with oncology.  Her staging is at least a IIIB and there  is some concern for possible right lung involvement which would make her stage IVB.  She has microcytic anemia of chronic disease due to her Hodgkin's lymphoma.  No evidence of iron deficiency.  She was getting packed red blood cell transfusions from her hematoma physician.  - She also had neutrophilic leukocytosis likely from steroid and her chemotherapy agents.  - treatment for her osteoporosis. Will need optimization of her vitamin D levels closer to 50-60 with adequate calcium replacement and possible consideration of parenteral bisphosphonates versus Prolia per Onc provider recently seen.   She recently underwent a T10 kyphoplasty due to ongoing back pain- ? Pathologic from [facility]  lymphoma and needed back surgery with Dr Rolena Infante.  This has helped her pain somewhat and she still uses a couple Percocets a day, and her cane to ambulate.  Getting pain meds from her Ortho surgeon.   In preparation for the surgery patient also underwent cardiac catheterization and was found to have triple-vessel coronary artery disease.  She remains asymptomatic.  She is followed by Dr. Jaye Beagle of cardiology   Wt Readings from Last 3 Encounters:  01/26/18 124 lb (56.2 kg)  01/06/18 124 lb 6.4 oz (56.4 kg)  11/24/17 121 lb (54.9 kg)   BP Readings from Last 3 Encounters:  01/26/18 (!) 148/83  01/06/18 122/60  11/24/17 133/76   Pulse Readings from Last 3 Encounters:  01/26/18 69  01/06/18 68  11/24/17 90   BMI Readings from Last 3 Encounters:  01/26/18 21.28 kg/m  01/06/18 21.35 kg/m  11/24/17 20.77 kg/m     Patient Care Team    Relationship Specialty Notifications Start End  [name], DO PCP - General Family Medicine  07/02/17   Josue Hector, MD Consulting Physician Cardiology  07/02/17   Penni Bombard, MD Consulting Physician Neurology  07/02/17   Jarome Matin, MD Consulting Physician Dermatology  07/02/17   Rozetta Nunnery, MD Consulting Physician Otolaryngology  07/02/17     Brunetta Genera, MD Consulting Physician Hematology  07/02/17   Melina Schools, MD Consulting Physician Orthopedic Surgery  07/02/17   Josue Hector, MD Consulting Physician Cardiology  09/16/17      Patient Active Problem List   Diagnosis Date Noted  . Hodgkin lymphoma ([place]) 04/26/2017    Priority: High  . Essential hypertension, benign 01/08/2009    Priority: High  . Coronary atherosclerosis- sees Dr. Johnsie Cancel 01/08/2009    Priority: High  . Closed compression fracture of L2 lumbar vertebra ([place]) 06/24/2017    Priority: Medium  . Symptomatic anemia 04/26/2017    Priority: Medium  . HYPERCHOLESTEROLEMIA, MIXED 01/08/2009    Priority: Medium  . Hypothyroidism 01/08/2009    Priority: Medium  . Osteoporosis 07/23/2017    Priority: Low  . CAD S/P percutaneous coronary angioplasty 07/02/2017    Priority: Low  . Vitamin D deficiency 11/24/2017  . Glucose intolerance (impaired glucose tolerance) 11/24/2017  . Osteoarthritis 09/16/2017  . History of iron deficiency anemia-  by ONC 07/23/2017  . Abnormal nuclear cardiac  imaging test 06/16/2017  . Malnutrition of moderate degree 04/27/2017  . Port catheter in place 02/02/2017  . 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 Dyspnea 09/14/2012  . Epistaxis 06/02/2011    Past Medical history, Surgical history, Family history, Social history, Allergies and Medications have been entered into the medical record, reviewed and changed as needed.    Current Meds  Medication Sig  . aspirin 81 MG tablet Take 81 mg by mouth daily.    . Cholecalciferol (VITAMIN D3) 1000 units CAPS Take 1 capsule by mouth daily.  . Coenzyme Q10 100 MG capsule Take 100 mg by mouth daily.   . Glucosamine 750 MG TABS Take 1 tablet by mouth daily.  [name] Kitchen lactose free nutrition (BOOST PLUS) LIQD Take 237 mLs by mouth 3 (three) times daily with meals.  [name] Kitchen levothyroxine (SYNTHROID, LEVOTHROID) 88 MCG tablet Take 88 mcg by mouth daily  before breakfast.   . Multiple Vitamins-Minerals (CENTRUM SILVER PO) Take 1 tablet by mouth daily.    . nitroGLYCERIN (NITROLINGUAL) 0.4 MG/SPRAY spray Place 1 spray under the tongue every 5 (five) minutes x 3 doses as needed for chest pain (3 sprays max).  . Omega-3 Fatty Acids (FISH OIL) 1000 MG CAPS Take 1 capsule by mouth daily.   [name] Kitchen PROAIR HFA 108 (90 Base) MCG/ACT inhaler Inhale 2 puffs into the lungs every 4 (four) hours as needed for shortness of breath.   . RESTASIS 0.05 % ophthalmic emulsion Place 2 drops into both eyes 2 (two) times daily. For dry eyes  . traZODone (DESYREL) 50 MG tablet Take 0.5-1 tablets (25-50 mg total) by mouth at bedtime as needed for sleep.  . Vitamin D, Ergocalciferol, (DRISDOL) 50000 units CAPS capsule Take 1 capsule (50,000 Units total) by mouth every 7 (seven) days.  . [DISCONTINUED] iron polysaccharides (NIFEREX) 150 MG capsule Take 1 capsule (150 mg total) by mouth daily.  . [DISCONTINUED] metoprolol succinate (TOPROL-XL) 25 MG 24 hr tablet Take 12.5 mg by mouth every other day.   . [DISCONTINUED] oxyCODONE-acetaminophen (ROXICET) 5-325 MG tablet Take 1 tablet by mouth every 6 (six) hours as needed. (Patient not taking: Reported on 01/06/2018)  . [DISCONTINUED] polyethylene glycol powder (MIRALAX) powder Take 1 Container by mouth once.    Allergies:  Allergies  Allergen Reactions  . Fosamax [Alendronate Sodium] Other (See Comments)    "made me ache all over"     Review of Systems: General:   Denies fever, chills, unexplained weight loss.  Optho/Auditory:   Denies visual changes, blurred vision/LOV Respiratory:   Denies wheeze, DOE more than baseline levels.  Cardiovascular:   Denies chest pain, palpitations, new onset peripheral edema  Gastrointestinal:   Denies nausea, vomiting, diarrhea, abd pain.  Genitourinary: Denies dysuria, freq/ urgency, flank pain or discharge from genitals.  Endocrine:     Denies hot or cold intolerance, polyuria,  polydipsia. Musculoskeletal:   Denies unexplained myalgias, joint swelling, unexplained arthralgias, gait problems.  Skin:  Denies new onset rash, suspicious lesions Neurological:     Denies dizziness, unexplained weakness, numbness  Psychiatric/Behavioral:   Denies mood changes, suicidal or homicidal ideations, hallucinations    Objective:   Blood pressure 133/76, pulse 90, temperature 98.4 F (36.9 C), height _0  (1.626 m), weight 121 lb (54.9 kg), SpO2 97 %. Body mass index is 20.77 kg/m. General:  Well Developed, well nourished, appropriate for stated age.  Neuro:  Alert and oriented,  extra-ocular muscles intact  HEENT:  Normocephalic,  atraumatic, neck supple, no carotid bruits appreciated  Skin:  no gross rash, warm, pink. Cardiac:  RRR, S1 S2 Respiratory:  ECTA B/L and A/P, Not using accessory muscles, speaking in full sentences- unlabored. Vascular:  Ext warm, no cyanosis apprec.; cap RF less 2 sec. Psych:  No HI/SI, judgement and insight good, Euthymic mood. Full Affect.       Impression and Recommendations:    1. Essential hypertension, benign   2. Atherosclerosis of native coronary artery of native heart without angina pectoris   3. Other specified hypothyroidism   4. Osteoporosis, unspecified osteoporosis type, unspecified pathological fracture presence   5. Vitamin D deficiency   6. Glucose intolerance (impaired glucose tolerance)   7. Hodgkin lymphoma, unspecified Hodgkin lymphoma type, unspecified body region ([place])     No orders of the defined types were placed in this encounter.  Current Meds  Medication Sig  . aspirin 81 MG tablet Take 81 mg by mouth daily.    . Cholecalciferol (VITAMIN D3) 1000 units CAPS Take 1 capsule by mouth daily.  . Coenzyme Q10 100 MG capsule Take 100 mg by mouth daily.   . Glucosamine 750 MG TABS Take 1 tablet by mouth daily.  [name] Kitchen lactose free nutrition (BOOST PLUS) LIQD Take 237 mLs by mouth 3 (three) times daily with meals.    [name] Kitchen levothyroxine (SYNTHROID, LEVOTHROID) 88 MCG tablet Take 88 mcg by mouth daily before breakfast.   . Multiple Vitamins-Minerals (CENTRUM SILVER PO) Take 1 tablet by mouth daily.    . nitroGLYCERIN (NITROLINGUAL) 0.4 MG/SPRAY spray Place 1 spray under the tongue every 5 (five) minutes x 3 doses as needed for chest pain (3 sprays max).  . Omega-3 Fatty Acids (FISH OIL) 1000 MG CAPS Take 1 capsule by mouth daily.   [name] Kitchen PROAIR HFA 108 (90 Base) MCG/ACT inhaler Inhale 2 puffs into the lungs every 4 (four) hours as needed for shortness of breath.   . RESTASIS 0.05 % ophthalmic emulsion Place 2 drops into both eyes 2 (two) times daily. For dry eyes  . traZODone (DESYREL) 50 MG tablet Take 0.5-1 tablets (25-50 mg total) by mouth at bedtime as needed for sleep.  . Vitamin D, Ergocalciferol, (DRISDOL) 50000 units CAPS capsule Take 1 capsule (50,000 Units total) by mouth every 7 (seven) days.  . [DISCONTINUED] iron polysaccharides (NIFEREX) 150 MG capsule Take 1 capsule (150 mg total) by mouth daily.  . [DISCONTINUED] metoprolol succinate (TOPROL-XL) 25 MG 24 hr tablet Take 12.5 mg by mouth every other day.   . [DISCONTINUED] oxyCODONE-acetaminophen (ROXICET) 5-325 MG tablet Take 1 tablet by mouth every 6 (six) hours as needed. (Patient not taking: Reported on 01/06/2018)  . [DISCONTINUED] polyethylene glycol powder (MIRALAX) powder Take 1 Container by mouth once.      1. Essential hypertension, benign   2. Atherosclerosis of native coronary artery of native heart without angina pectoris   3. Other specified hypothyroidism   4. Osteoporosis, unspecified osteoporosis type, unspecified pathological fracture presence   5. Vitamin D deficiency   6. Glucose intolerance (impaired glucose tolerance)   7. Hodgkin lymphoma, unspecified Hodgkin lymphoma type, unspecified body region [facility])      Plan   Gross side effects, risk and benefits, and alternatives of medications and treatment plan in general discussed  with patient.  Patient is aware that all medications have potential side effects and we are unable to predict every side effect or drug-drug interaction that may occur.   Patient will call with any questions  prior to using medication if they have concerns.  Expresses verbal understanding and consents to current therapy and treatment regimen.  No barriers to understanding were identified.  Red flag symptoms and signs discussed in detail.  Patient expressed understanding regarding what to do in case of emergency\urgent symptoms  Please see AVS handed out to patient at the end of our visit for further patient instructions/ counseling done pertaining to today's office visit.   Return in about 4 weeks (around 12/22/2017) for Since changed BP meds.  Please bring in log from home, will check vitamin D and A1c.    Note: This note was prepared with assistance of Dragon voice recognition software. Occasional wrong-word or sound-a-like substitutions may have occurred due to the inherent limitations of voice recognition software.  Shannara Winbush 9:22 AM --------------------------------------------------------------------------------------------------------------------------------------------------------------------------------------------------------------------------------------------    Subjective:    CC:  Chief Complaint  Patient presents with  . Follow-up    HPI: HARLIE BUENING is a 83 y.o. female who presents to [place] [facility] today for issues as discussed below.  Today:  -Elevated LDL: Patient had seen Dr. Clearnce Sorrel of cardiology about her coronary artery disease on 09/15/2017 since I last saw her for office visit on 8/3.  They felt that she would be medically managed for her coronary artery disease.  No medications were started or changed at that time. --Patient denies any angina even with decorating her outside and inside of her home for Christmas.  She cannot recall the  last time she had to take any nitroglycerin given to her by cardiology.  We reviewed her bldwrk from Sept today as well.    Regarding her Hodgkin lymphoma: She went through chemotherapy.  She was told recently she is cancer free.  Initial diagnosis was January 2018.  Patient has been feeling very well.  She is put on a lift few pounds and her appetite is coming back.  Sleep: Patient did have difficulty with sleep during through her chemo treatments and all over the past several months however her oncologist Dr. Velvet Bathe gave her trazodone.  She occasionally takes it and has not take 1 and 3 nights now.  She still wishes she slept better but it is definitely improved from prior.  Trouble falling alseep.   - She has not hit her goal of [place] around [place].   No problems updated.     - Pt had bldwrk oin Sept 19- : notes  "Notes recorded by [name], DO on 09/17/2017 at 7:57 AM EDT send in a prescription Prescription for 50,000 IUs vitamin D with 10 refills, she should be taking 1200 of calcium daily. ------  Notes recorded by Jerilee Field, CMA on 09/16/2017 at 6:13 PM EDT Patient called. Patient aware. Patient states that she takes a MVI daily and does not take any calcium. Patient slao states that she had an RX for Vitamin D but does not know the strength, she has not been taking for about 14 days patient will call pharmacy and see if she has refills.Patient will let me know the strength of the Vitamin D. MPulliam, CMA/RT(R)  ------  Notes recorded by [name], DO on 09/16/2017 at 1:59 PM EDT Please confirm how much calcium and vitamin D supplement patient is on daily.  Her vitamin D is suboptimal and should be in the 50-60 range. Please let me know.  Please let her know that her blood sugars are improved and A1c has  decreased. This should be monitored in 4 months.  LDL cholesterol is elevated above goal. With her recent history of coronary  artery disease, and with her following with cardiology now I would defer any treatment plans to them. Please forward only FLP anc CMP to cards only  Her renal function is taken a turn for the worse probably status post her chemotherapy treatments. She should have this repeated regularly per hematology oncology. Improving hydration status and exercising as we discussed would help some.  - With all these abnormalities, if patient would like to discuss results in person to better understand him, I welcome that. Please have her make a follow-up at her earliest convenience. Otherwise, have her follow up as a last discuss last office visit."   From last OV:   -  Goal- to walk around [employer] with friends at least 3 d/wk --- over time working her way up to 23mn each time she walks  - make appt for fasting bldwrk near future- lab only  FLP, TSH, T4, A1c, vit D  HPI:  Changing docs to be closer to home and also- daughter went with her to her past PCP-   Sees heart Doc once yrly or so.   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   [place]    Exercise:  Sitting a lot of the time.   Her [place] [employer] [place] and She plans on going w them in future.   - She is currently undergoing treatment for her Hodgkin's lymphoma with oncology.  Her staging is at least a IIIB and there is some concern for possible right lung involvement which would make her stage IVB.  She has microcytic anemia of chronic disease due to her Hodgkin's lymphoma.  No evidence of iron deficiency.  She was getting packed red blood cell transfusions from her hematoma physician.  - She also had neutrophilic leukocytosis likely from steroid and her chemotherapy agents.  - treatment for her osteoporosis. Will need optimization of her vitamin D levels closer to 50-60 with adequate calcium replacement and possible consideration of  parenteral bisphosphonates versus Prolia per Onc provider recently seen.   She recently underwent a T10 kyphoplasty due to ongoing back pain- [facility] with Dr BRolena Infante  This has helped her pain somewhat and she still uses a couple Percocets a day, and her cane to ambulate.  Getting pain meds from her Ortho surgeon.   In preparation for the surgery patient also underwent cardiac catheterization and was found to have triple-vessel coronary artery disease.  She remains asymptomatic.  She is followed by Dr. NJaye Beagleof cardiology   A/P:   1. Hodgkin lymphoma, unspecified Hodgkin lymphoma type, unspecified body [place]   2. Acquired hypothyroidism   3. HYPERCHOLESTEROLEMIA, MIXED   4. Essential hypertension, benign   5. History of iron deficiency anemia-  by ONC    - txmnt per Onc - obtain labs near future to assess thyroid status - obtain FLP, CMP - Bp - stable - obtain CBC- gets tx from ONC occ.  Not c/w Fe def but microcytic anemia of chronic dx.    [place]- [place] --- over time working her way up to 330m each time she walks  - make appt for fasting bldwrk near future-  lab only  FLP, TSH, T4, A1c, vit D  The patient was counseled, risk factors were discussed, anticipatory guidance given.  Gross side effects, risk and benefits, and alternatives of medications and treatment plan in general discussed with patient.  Patient is aware that all medications have potential side effects and we are unable to predict every side effect or drug-drug interaction that may occur.   Patient will call with any questions prior to using medication if they have concerns.  Expresses verbal understanding and consents to current therapy and treatment regimen.  No barriers to understanding were identified.  Red flag symptoms and signs discussed in detail.  Patient expressed understanding regarding what to  do in case of emergency\urgent symptoms  Please see AVS handed out to patient at the end of our visit for further patient instructions/ counseling done pertaining to today's office visit.   Return in about 4 months (around 11/22/2017) for thyroid, hld, htn- f/up if needed to discuss labs if txmnt needed.   Wt Readings from Last 3 Encounters:  01/26/18 124 lb (56.2 kg)  01/06/18 124 lb 6.4 oz (56.4 kg)  11/24/17 121 lb (54.9 kg)   BP Readings from Last 3 Encounters:  01/26/18 (!) 148/83  01/06/18 122/60  11/24/17 133/76   Pulse Readings from Last 3 Encounters:  01/26/18 69  01/06/18 68  11/24/17 90   BMI Readings from Last 3 Encounters:  01/26/18 21.28 kg/m  01/06/18 21.35 kg/m  11/24/17 20.77 kg/m     Patient Care Team    Relationship Specialty Notifications Start End  [name], DO PCP - General Family Medicine  07/02/17   Josue Hector, MD Consulting Physician Cardiology  07/02/17   Penni Bombard, MD Consulting Physician Neurology  07/02/17   Jarome Matin, MD Consulting Physician Dermatology  07/02/17   Rozetta Nunnery, MD Consulting Physician Otolaryngology  07/02/17   Brunetta Genera, MD Consulting Physician Hematology  07/02/17   Melina Schools, MD Consulting Physician Orthopedic Surgery  07/02/17   Josue Hector, MD Consulting Physician Cardiology  09/16/17      Patient Active Problem List   Diagnosis Date Noted  . Hodgkin lymphoma ([place]) 04/26/2017    Priority: High  . Essential hypertension, benign 01/08/2009    Priority: High  . Coronary atherosclerosis- sees Dr. Johnsie Cancel 01/08/2009    Priority: High  . Closed compression fracture of L2 lumbar vertebra ([place]) 06/24/2017    Priority: Medium  . Symptomatic anemia 04/26/2017    Priority: Medium  . HYPERCHOLESTEROLEMIA, MIXED 01/08/2009    Priority: Medium  . Hypothyroidism 01/08/2009    Priority: Medium  . Osteoporosis 07/23/2017    Priority: Low  . CAD S/P percutaneous coronary  angioplasty 07/02/2017    Priority: Low  . Vitamin D deficiency 11/24/2017  . Glucose intolerance (impaired glucose tolerance) 11/24/2017  . Osteoarthritis 09/16/2017  . History of iron deficiency anemia-  by ONC 07/23/2017  . Abnormal nuclear cardiac imaging test 06/16/2017  . Malnutrition of moderate degree 04/27/2017  . Port catheter in place 02/02/2017  . 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 Dyspnea 09/14/2012  . Epistaxis 06/02/2011    Past Medical history, Surgical history, Family history, Social history, Allergies and Medications have been entered into the medical record, reviewed and changed as needed.    Current Meds  Medication Sig  . aspirin 81 MG tablet Take 81 mg by mouth daily.    . Cholecalciferol (VITAMIN D3) 1000  units CAPS Take 1 capsule by mouth daily.  . Coenzyme Q10 100 MG capsule Take 100 mg by mouth daily.   . Glucosamine 750 MG TABS Take 1 tablet by mouth daily.  [name] Kitchen lactose free nutrition (BOOST PLUS) LIQD Take 237 mLs by mouth 3 (three) times daily with meals.  [name] Kitchen levothyroxine (SYNTHROID, LEVOTHROID) 88 MCG tablet Take 88 mcg by mouth daily before breakfast.   . Multiple Vitamins-Minerals (CENTRUM SILVER PO) Take 1 tablet by mouth daily.    . nitroGLYCERIN (NITROLINGUAL) 0.4 MG/SPRAY spray Place 1 spray under the tongue every 5 (five) minutes x 3 doses as needed for chest pain (3 sprays max).  . Omega-3 Fatty Acids (FISH OIL) 1000 MG CAPS Take 1 capsule by mouth daily.   [name] Kitchen PROAIR HFA 108 (90 Base) MCG/ACT inhaler Inhale 2 puffs into the lungs every 4 (four) hours as needed for shortness of breath.   . RESTASIS 0.05 % ophthalmic emulsion Place 2 drops into both eyes 2 (two) times daily. For dry eyes  . traZODone (DESYREL) 50 MG tablet Take 0.5-1 tablets (25-50 mg total) by mouth at bedtime as needed for sleep.  . Vitamin D, Ergocalciferol, (DRISDOL) 50000 units CAPS capsule Take 1 capsule (50,000 Units total) by mouth  every 7 (seven) days.  . [DISCONTINUED] iron polysaccharides (NIFEREX) 150 MG capsule Take 1 capsule (150 mg total) by mouth daily.  . [DISCONTINUED] metoprolol succinate (TOPROL-XL) 25 MG 24 hr tablet Take 12.5 mg by mouth every other day.   . [DISCONTINUED] oxyCODONE-acetaminophen (ROXICET) 5-325 MG tablet Take 1 tablet by mouth every 6 (six) hours as needed. (Patient not taking: Reported on 01/06/2018)  . [DISCONTINUED] polyethylene glycol powder (MIRALAX) powder Take 1 Container by mouth once.    Allergies:  Allergies  Allergen Reactions  . Fosamax [Alendronate Sodium] Other (See Comments)    "made me ache all over"     Review of Systems: General:   Denies fever, chills, unexplained weight loss.  Optho/Auditory:   Denies visual changes, blurred vision/LOV Respiratory:   Denies wheeze, DOE more than baseline levels.  Cardiovascular:   Denies chest pain, palpitations, new onset peripheral edema  Gastrointestinal:   Denies nausea, vomiting, diarrhea, abd pain.  Genitourinary: Denies dysuria, freq/ urgency, flank pain or discharge from genitals.  Endocrine:     Denies hot or cold intolerance, polyuria, polydipsia. Musculoskeletal:   Denies unexplained myalgias, joint swelling, unexplained arthralgias, gait problems.  Skin:  Denies new onset rash, suspicious lesions Neurological:     Denies dizziness, unexplained weakness, numbness  Psychiatric/Behavioral:   Denies mood changes, suicidal or homicidal ideations, hallucinations    Objective:   Blood pressure 133/76, pulse 90, temperature 98.4 F (36.9 C), height _0  (1.626 m), weight 121 lb (54.9 kg), SpO2 97 %. Body mass index is 20.77 kg/m. General:  Well Developed, well nourished, appropriate for stated age.  Neuro:  Alert and oriented,  extra-ocular muscles intact  HEENT:  Normocephalic, atraumatic, neck supple, no carotid bruits appreciated  Skin:  no gross rash, warm, pink. Cardiac:  RRR, S1 S2 Respiratory:  ECTA B/L and  A/P, Not using accessory muscles, speaking in full sentences- unlabored. Vascular:  Ext warm, no cyanosis apprec.; cap RF less 2 sec. Psych:  No HI/SI, judgement and insight good, Euthymic mood. Full Affect.  Recent Results (from the past 2160 hour(s))  CBC with Differential/Platelet     Status: Abnormal   Collection Time: 10/28/17 11:55 AM  Result Value Ref Range   WBC 5.3  4.0 - 10.5 K/uL   RBC 4.01 3.87 - 5.11 MIL/uL   Hemoglobin 11.5 (L) 12.0 - 15.0 g/dL   HCT 35.4 (L) 36.0 - 46.0 %   MCV 88.3 78.0 - 100.0 fL   MCH 28.7 26.0 - 34.0 pg   MCHC 32.5 30.0 - 36.0 g/dL   RDW 15.2 11.5 - 15.5 %   Platelets 355 150 - 400 K/uL   Neutrophils Relative % 62 %   Neutro Abs 3.3 1.7 - 7.7 K/uL   Lymphocytes Relative 21 %   Lymphs Abs 1.1 0.7 - 4.0 K/uL   Monocytes Relative 6 %   Monocytes Absolute 0.3 0.1 - 1.0 K/uL   Eosinophils Relative 9 %   Eosinophils Absolute 0.5 0.0 - 0.7 K/uL   Basophils Relative 2 %   Basophils Absolute 0.1 0.0 - 0.1 K/uL  Protime-INR     Status: None   Collection Time: 10/28/17 11:55 AM  Result Value Ref Range   Prothrombin Time 13.0 11.4 - 15.2 seconds   INR 0.99   Comprehensive metabolic panel     Status: Abnormal   Collection Time: 01/06/18 11:37 AM  Result Value Ref Range   Sodium 141 136 - 145 mmol/L   Potassium 4.4 3.3 - 4.7 mmol/L   Chloride 106 98 - 109 mmol/L   CO2 28 22 - 29 mmol/L   Glucose, Bld 87 70 - 140 mg/dL   BUN 19 7 - 26 mg/dL   Creatinine, Ser 0.89 0.60 - 1.10 mg/dL   Calcium 9.5 8.4 - 10.4 mg/dL   Total Protein 7.1 6.4 - 8.3 g/dL   Albumin 3.9 3.5 - 5.0 g/dL   AST 19 5 - 34 U/L   ALT 16 0 - 55 U/L   Alkaline Phosphatase 72 40 - 150 U/L   Total Bilirubin 0.4 0.2 - 1.2 mg/dL   GFR calc non Af Amer 59 (L) >60 mL/min   GFR calc Af Amer >60 >60 mL/min    Comment: (NOTE) The eGFR has been calculated using the CKD EPI equation. This calculation has not been validated in all clinical situations. eGFR's persistently <60 mL/min signify  possible Chronic Kidney Disease.    Anion gap 7 3 - 11    Comment: Performed at [facility], [address], [place], [place] [address]  Sedimentation rate     Status: None   Collection Time: 01/06/18 11:37 AM  Result Value Ref Range   Sed Rate 16 0 - 22 mm/hr    Comment: Performed at [facility], [place] [address], [place], [place] [address]  CBC with Differential ([place] Only)     Status: None   Collection Time: 01/06/18 11:37 AM  Result Value Ref Range   WBC Count 6.8 3.9 - 10.3 K/uL   RBC 4.24 3.70 - 5.45 MIL/uL   Hemoglobin 12.5 11.6 - 15.9 g/dL   HCT 38.6 34.8 - 46.6 %   MCV 91.0 79.5 - 101.0 fL   MCH 29.5 25.1 - 34.0 pg   MCHC 32.4 31.5 - 36.0 g/dL   RDW 14.3 11.2 - 16.1 %   Platelet Count 342 145 - 400 K/uL   Neutrophils Relative % 63 %   Neutro Abs 4.3 1.5 - 6.5 K/uL   Lymphocytes Relative 19 %   Lymphs Abs 1.3 0.9 - 3.3 K/uL   Monocytes Relative 9 %   Monocytes Absolute 0.6 0.1 - 0.9 K/uL   Eosinophils Relative 8 %  Eosinophils Absolute 0.5 0.0 - 0.5 K/uL   Basophils Relative 1 %   Basophils Absolute 0.1 0.0 - 0.1 K/uL    Comment: Performed at [facility], [address] [place], [place] [address]  Reticulocytes     Status: None   Collection Time: 01/06/18 11:37 AM  Result Value Ref Range   Retic Ct Pct 0.9 0.7 - 2.1 %   RBC. 4.24 3.70 - 5.45 MIL/uL   Retic Count, Absolute 38.2 33.7 - 90.7 K/uL    Comment: Performed at [facility], [place] [address], [place], [place] [address]

## 2017-11-24 NOTE — Patient Instructions (Addendum)
-For your blood pressure we are going to transition you from metoprolol every other day to carvedilol daily.  You are going to start off with just a half a tablet and check your blood pressures.  If you have any symptoms please immediately get in touch with [place] to let us know if you are feeling dizzy or lightheaded etc.  Call    Please check your blood pressure at home and write it down.  I am going to see you in about 1 month to make sure you are doing okay with this medication change  Also since she started the vitamin D around mid-September next time you come in we will get some blood work to recheck that.  Continue to take your once weekly dose of the vitamin D.  And really try to get walking with your friends!!     Risk factors for prediabetes and type 2 diabetes  Researchers don't fully understand why some people develop prediabetes and type 2 diabetes and others don't.  It's clear that certain factors increase the risk, however, including:  Weight. The more fatty tissue you have, the more resistant your cells become to insulin.  Inactivity. The less active you are, the greater your risk. Physical activity helps you control your weight, uses up glucose as energy and makes your cells more sensitive to insulin.  Family history. Your risk increases if a parent or sibling has type 2 diabetes.  Race. Although it's unclear why, people of certain races - including blacks, Hispanics, American Indians and Asian-Americans - are at higher risk.  Age. Your risk increases as you get older. This may be because you tend to exercise less, lose muscle mass and gain weight as you age. But type 2 diabetes is also increasing dramatically among children, adolescents and younger adults.  Gestational diabetes. If you developed gestational diabetes when you were pregnant, your risk of developing prediabetes and type 2 diabetes later increases. If you gave birth to a baby weighing more than 9 pounds (4 kilograms),  you're also at risk of type 2 diabetes.  Polycystic ovary syndrome. For women, having polycystic ovary syndrome - a common condition characterized by irregular menstrual periods, excess hair growth and obesity - increases the risk of diabetes.  High blood pressure. Having blood pressure over 140/90 millimeters of mercury (mm Hg) is linked to an increased risk of type 2 diabetes.  Abnormal cholesterol and triglyceride levels. If you have low levels of high-density lipoprotein (HDL), or "good," cholesterol, your risk of type 2 diabetes is higher. Triglycerides are another type of fat carried in the blood. People with high levels of triglycerides have an increased risk of type 2 diabetes. Your doctor can let you know what your cholesterol and triglyceride levels are.  A good guide to good carbs: The glycemic index ---If you have diabetes, or at risk for diabetes, you know all too well that when you eat carbohydrates, your blood sugar goes up. The total amount of carbs you consume at a meal or in a snack mostly determines what your blood sugar will do. But the food itself also plays a role. A serving of white rice has almost the same effect as eating pure table sugar - a quick, high spike in blood sugar. A serving of lentils has a slower, smaller effect.  ---Picking good sources of carbs can help you control your blood sugar and your weight. Even if you don't have diabetes, eating healthier carbohydrate-rich foods can help ward off  a host of chronic conditions, from heart disease to various cancers to, well, diabetes.  ---One way to choose foods is with the glycemic index (GI). This tool measures how much a food boosts blood sugar.  The glycemic index rates the effect of a specific amount of a food on blood sugar compared with the same amount of pure glucose. A food with a glycemic index of 28 boosts blood sugar only 28% as much as pure glucose. One with a GI of 95 acts like pure glucose.    High glycemic  foods result in a quick spike in insulin and blood sugar (also known as blood glucose).  Low glycemic foods have a slower, smaller effect- these are healthier for you.   Using the glycemic index Using the glycemic index is easy: choose foods in the low GI category instead of those in the high GI category (see below), and go easy on those in between. Low glycemic index (GI of 55 or less): Most fruits and vegetables, beans, minimally processed grains, pasta, low-fat dairy foods, and nuts.  Moderate glycemic index (GI 56 to 69): White and sweet potatoes, corn, white rice, couscous, breakfast cereals such as Cream of Wheat and Mini Wheats.  High glycemic index (GI of 70 or higher): White bread, rice cakes, most crackers, bagels, cakes, doughnuts, croissants, most packaged breakfast cereals. You can see the values for 100 commons foods and get links to more at www.health.CheapToothpicks.si.  Swaps for lowering glycemic index  Instead of this high-glycemic index food Eat this lower-glycemic index food  White rice Brown rice or converted rice  Instant oatmeal Steel-cut oats  Cornflakes Bran flakes  Baked potato Pasta, bulgur  White bread Whole-grain bread  Corn Peas or leafy greens       Prediabetes Eating Plan  Prediabetes--also called impaired glucose tolerance or impaired fasting glucose--is a condition that causes blood sugar (blood glucose) levels to be higher than normal. Following a healthy diet can help to keep prediabetes under control. It can also help to lower the risk of type 2 diabetes and heart disease, which are increased in people who have prediabetes. Along with regular exercise, a healthy diet:  Promotes weight loss.  Helps to control blood sugar levels.  Helps to improve the way that the body uses insulin.   WHAT DO I NEED TO KNOW ABOUT THIS EATING PLAN?   Use the glycemic index (GI) to plan your meals. The index tells you how quickly a food will raise your blood  sugar. Choose low-GI foods. These foods take a longer time to raise blood sugar.  Pay close attention to the amount of carbohydrates in the food that you eat. Carbohydrates increase blood sugar levels.  Keep track of how many calories you take in. Eating the right amount of calories will help you to achieve a healthy weight. Losing about 7 percent of your starting weight can help to prevent type 2 diabetes.  You may want to follow a Mediterranean diet. This diet includes a lot of vegetables, lean meats or fish, whole grains, fruits, and healthy oils and fats.   WHAT FOODS CAN I EAT?  Grains Whole grains, such as whole-wheat or whole-grain breads, crackers, cereals, and pasta. Unsweetened oatmeal. Bulgur. Barley. Quinoa. Brown rice. Corn or whole-wheat flour tortillas or taco shells. Vegetables Lettuce. Spinach. Peas. Beets. Cauliflower. Cabbage. Broccoli. Carrots. Tomatoes. Squash. Eggplant. Herbs. Peppers. Onions. Cucumbers. Brussels sprouts. Fruits Berries. Bananas. Apples. Oranges. Grapes. Papaya. Mango. Pomegranate. Kiwi. Grapefruit. Cherries. Meats and  Other Protein Sources Seafood. Lean meats, such as chicken and [place] or lean cuts of pork and beef. Tofu. Eggs. Nuts. Beans. Dairy Low-fat or fat-free dairy products, such as yogurt, cottage cheese, and cheese. Beverages Water. Tea. Coffee. Sugar-free or diet soda. Seltzer water. Milk. Milk alternatives, such as soy or almond milk. Condiments Mustard. Relish. Low-fat, low-sugar ketchup. Low-fat, low-sugar barbecue sauce. Low-fat or fat-free mayonnaise. Sweets and Desserts Sugar-free or low-fat pudding. Sugar-free or low-fat ice cream and other frozen treats. Fats and Oils Avocado. Walnuts. Olive oil. The items listed above may not be a complete list of recommended foods or beverages. Contact your dietitian for more options.    WHAT FOODS ARE NOT RECOMMENDED?  Grains Refined white flour and flour products, such as bread, pasta,  snack foods, and cereals. Beverages Sweetened drinks, such as sweet iced tea and soda. Sweets and Desserts Baked goods, such as cake, cupcakes, pastries, cookies, and cheesecake. The items listed above may not be a complete list of foods and beverages to avoid. Contact your dietitian for more information.   This information is not intended to replace advice given to you by your health care provider. Make sure you discuss any questions you have with your health care provider.   Document Released: 04/23/2015 Document Reviewed: 04/23/2015 Elsevier Interactive Patient Education [employer].

## 2017-11-24 NOTE — Telephone Encounter (Signed)
Rcvd call frm Shelly @ [facility] [phone] stating 2 different dosage instructions on Rx-- Please call to clarify which one is correct---- Pt is waiting at store for Rx .  -- ph# [phone]  --glh

## 2017-11-25 NOTE — Telephone Encounter (Signed)
Called and notified pharmacist of correct dosage.MPulliam, CMA/RT(R)

## 2017-12-22 ENCOUNTER — Telehealth: Payer: Self-pay | Admitting: Family Medicine

## 2017-12-22 NOTE — Telephone Encounter (Signed)
Patient called states has not started taking:   (Carvedilol 3.125 mg Oral 2 times daily with meals, 1/2 tab po q am)---Patient says still has some of the old medicine to complete before she starts the new meds so no need for the 12/28/17 appointment & cancelled. ---   Pt also says she is confused on how to cut such a small pill in order to follow instructions to take 1/2 tab in morning & 1/2 with diner) she request nurse contact her at listed home#[phone].  -glh

## 2017-12-22 NOTE — Telephone Encounter (Signed)
Spoke to patient, appointment made for 01/26/2018.  Patient has a pill cutter and tried it and it works on the pills. MPulliam, CMA/RT(R)

## 2017-12-28 ENCOUNTER — Ambulatory Visit: Payer: Medicare Other | Admitting: Family Medicine

## 2018-01-05 NOTE — Progress Notes (Signed)
[address]    HEMATOLOGY/ONCOLOGY CLINIC NOTE  Date of Service: 01/06/18  Patient Care Team: [name], DO as PCP - General (Family Medicine) Josue Hector, MD as Consulting Physician (Cardiology) Penni Bombard, MD as Consulting Physician (Neurology) Jarome Matin, MD as Consulting Physician (Dermatology) Rozetta Nunnery, MD as Consulting Physician (Otolaryngology) Brunetta Genera, MD as Consulting Physician (Hematology) Melina Schools, MD as Consulting Physician (Orthopedic Surgery) Josue Hector, MD as Consulting Physician (Cardiology)  CHIEF COMPLAINTS/PURPOSE OF CONSULTATION:   f/u for continued treatment of Hodgkin's lymphoma  HISTORY OF PRESENTING ILLNESS:   plz see previous   INTERVAL HISTORY  Patient is here for routine follow up. She notes that she is doing well overall. She notes that her holidays went well and her children came and spent the holidays with her. 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 She states that the book entailed her life growing up so that her legacy could be passed onto her grandchildren. She notes that she recently retired from being a [employer] and had a quilt made of her shirts.   Hgb at 12.5, otherwise CBC WNL. Reticulocytes WNL. She had a bilateral mammogram on 10/20/2017 with results revealing: Breast density B. No mammographic evidence of malignancy.   On review of systems, she reports insomnia, right shoulder pain (self-treats with tylenol), weight gain, pruritis to right anterior forearm, hair growth, and intermittent mouth dryness. She was taken off of oxycodone within the past 2-3 weeks following her back surgery. She notes that she was taking oxycodone q 12 hours. She notes that she has been going to bed at 8:30-9 PM prior to the onset of her insomnia and she notes that lately she has started going to sleep at 10 PM, which has mildly alleviated her symptoms. She has also been taking  melatonin to alleviate her insomnia. She denies back pain, abdominal pain, and any other symptoms.      MEDICAL HISTORY:  Past Medical History:  Diagnosis Date  . Anemia   . Anginal pain ([place])    occ  . Blood dyscrasia 12/2016   hodgins lymphoma tx-remission  . CAD (coronary artery disease) 02/2006   Taxus stent placed to LAD and Diagonal per Dr. Olevia Perches  . Cataracts, bilateral   . Complication of anesthesia   . HTN (hypertension)   . Hypercholesterolemia   . Hypothyroidism   . IBS (irritable bowel syndrome)   . Osteoarthritis   . PONV (postoperative nausea and vomiting)   . Stroke [facility]) 03/2016  Osteoporosis Cardiologist Dr. [name] GI -Dr. Thana Farr  SURGICAL HISTORY: Past Surgical History:  Procedure Laterality Date  . APPENDECTOMY    . IR GENERIC HISTORICAL  01/25/2017   IR US GUIDE VASC ACCESS RIGHT 01/25/2017 [name], MD [facility]  . IR GENERIC HISTORICAL  01/25/2017   IR FLUORO GUIDE PORT INSERTION RIGHT 01/25/2017 [name], MD [facility]  . IR REMOVAL TUN ACCESS W/ PORT W/O FL MOD SED  10/28/2017  . KYPHOPLASTY N/A 06/24/2017   Procedure: KYPHOPLASTY T10;  Surgeon: Melina Schools, MD;  Location: [place];  Service: Orthopedics;  Laterality: N/A;  90 mins  . LEFT HEART CATH AND CORONARY ANGIOGRAPHY N/A 06/16/2017   Procedure: Left Heart Cath and Coronary Angiography;  Surgeon: Sherren Mocha, MD;  Location: [place] [facility];  Service: Cardiovascular;  Laterality: N/A;  . MASS EXCISION Right 12/22/2016   Procedure: RIGHT NECK LYMPH NODE BIOPSY;  Surgeon: Harrell Gave  Lincoln Maxin, MD;  Location: [place];  Service: ENT;  Laterality: Right;  . SPINE SURGERY    . stents     in heart  . TONSILLECTOMY    . TOTAL KNEE ARTHROPLASTY Bilateral 2011,2012  . VAGINAL HYSTERECTOMY      SOCIAL HISTORY: Social History   Socioeconomic History  . Marital status: Widowed    Spouse name: Not on file  . Number of children: 2  . Years of education: 28  .  Highest education level: Not on file  Social Needs  . Financial resource strain: Not on file  . Food insecurity - worry: Not on file  . Food insecurity - inability: Not on file  . Transportation needs - medical: Not on file  . Transportation needs - non-medical: Not on file  Occupational History    Employer: [employer]    Comment: Secretary  Tobacco Use  . Smoking status: Former Research scientist (life sciences)  . Smokeless tobacco: Never Used  . Tobacco comment: quit smoking 30 years ago, very light smoker  Substance and Sexual Activity  . Alcohol use: No    Alcohol/week: 0.0 oz  . Drug use: No  . Sexual activity: Not on file  Other Topics Concern  . Not on file  Social History Narrative   Lives alone   caffeine use- coffee- 5 cups daily    FAMILY HISTORY: Family History  Problem Relation Age of Onset  . Diabetes Mother   . Dementia Mother   . ALS Mother   . Heart Problems Father   . Liver disease Sister   . Cirrhosis Sister   . Heart block Unknown        CABG  . Diabetes Sister   . Heart block Son        CABG  . Other [place]  . Other Daughter        [place]  . Dementia Sister     ALLERGIES:  is allergic to fosamax [alendronate sodium].  MEDICATIONS:  Current Outpatient Medications  Medication Sig Dispense Refill  . aspirin 81 MG tablet Take 81 mg by mouth daily.      . carvedilol (COREG) 3.125 MG tablet Take 1 tablet (3.125 mg total) by mouth 2 (two) times daily with a meal. 1/2 tab po q am 30 tablet 0  . Cholecalciferol (VITAMIN D3) 1000 units CAPS Take 1 capsule by mouth daily.    . Coenzyme Q10 100 MG capsule Take 100 mg by mouth daily.     . Glucosamine 750 MG TABS Take 1 tablet by mouth daily.    . iron polysaccharides (NIFEREX) 150 MG capsule Take 1 capsule (150 mg total) by mouth daily. 30 capsule 0  . lactose free nutrition (BOOST PLUS) LIQD Take 237 mLs by mouth 3 (three) times daily with meals. 30 Can 0  . levothyroxine (SYNTHROID,  LEVOTHROID) 88 MCG tablet Take 88 mcg by mouth daily before breakfast.     . Multiple Vitamins-Minerals (CENTRUM SILVER PO) Take 1 tablet by mouth daily.      . nitroGLYCERIN (NITROLINGUAL) 0.4 MG/SPRAY spray Place 1 spray under the tongue every 5 (five) minutes x 3 doses as needed for chest pain (3 sprays max).    . Omega-3 Fatty Acids (FISH OIL) 1000 MG CAPS Take 1 capsule by mouth daily.     [address] PROAIR HFA 108 (90 Base) MCG/ACT inhaler Inhale 2 puffs into the lungs every  4 (four) hours as needed for shortness of breath.     . RESTASIS 0.05 % ophthalmic emulsion Place 2 drops into both eyes 2 (two) times daily. For dry eyes    . traZODone (DESYREL) 50 MG tablet Take 0.5-1 tablets (25-50 mg total) by mouth at bedtime as needed for sleep. 30 tablet 0  . Vitamin D, Ergocalciferol, (DRISDOL) 50000 units CAPS capsule Take 1 capsule (50,000 Units total) by mouth every 7 (seven) days. 12 capsule 3   No current facility-administered medications for this visit.     REVIEW OF SYSTEMS:    10 Point review of Systems was done is negative except as noted above.  PHYSICAL EXAMINATION:  ECOG PERFORMANCE STATUS: 2 - Symptomatic, <50% confined to bed  Vital signs reviewed in Epic  GENERAL:alert, in no acute distress and comfortable SKIN:Dry skin with some areas with scaly itchy. Not on the face or hands . Predominantly on the upper extremities . Areas of senile purpura . EYES: normal, conjunctiva are pink and non-injected, sclera clear OROPHARYNX:no exudate, no erythema and lips, buccal mucosa, and tongue normal  NECK: supple, no JVD, thyroid normal size, non-tender, without nodularity LYMPH:  no palpable lymphadenopathy in the cervical, axillary or inguinal LUNGS: clear to auscultation with normal respiratory effort HEART: regular rate & rhythm,  no murmurs and no lower extrety [place]em ABDOMEN: abdomen soft, non-tender, normoactive bowel sounds  Musculoskeletal: no cyanosis of digits and no clubbing    PSYCH: alert & oriented x 3 with fluent speech NEURO: no focal motor/sensory deficits  LABORATORY DATA:  I have reviewed the data as listed   . CBC Latest Ref Rng & Units 01/06/2018 10/28/2017 10/07/2017  WBC 3.9 - 10.3 K/uL 6.8 5.3 6.1  Hemoglobin 12.0 - 15.0 g/dL - 11.5(L) 11.6  Hematocrit 34.8 - 46.6 % 38.6 35.4(L) 35.9  Platelets 145 - 400 K/uL 342 355 333   . CMP Latest Ref Rng & Units 01/06/2018 10/07/2017 09/08/2017  Glucose 70 - 140 mg/dL 87 103 112(H)  BUN 7 - 26 mg/dL 19 18.5 21  Creatinine 0.60 - 1.10 mg/dL 0.89 0.9 1.17(H)  Sodium 136 - 145 mmol/L 141 139 145(H)  Potassium 3.3 - 4.7 mmol/L 4.4 4.3 4.8  Chloride 98 - 109 mmol/L 106 - 103  CO2 22 - 29 mmol/L 28 25 23   Calcium 8.4 - 10.4 mg/dL 9.5 9.2 10.2  Total Protein 6.4 - 8.3 g/dL 7.1 6.7 7.3  Total Bilirubin 0.2 - 1.2 mg/dL 0.4 0.72 0.5  Alkaline Phos 40 - 150 U/L 72 63 78  AST 5 - 34 U/L 19 24 26   ALT 0 - 55 U/L 16 17 15    Erythrocyte Sedimentation Rate     Component Value Date/Time   ESRSEDRATE 20 10/07/2017 1115     RADIOGRAPHIC STUDIES: I have personally reviewed the radiological images as listed and agreed with the findings in the report. No results found.   Transthoracic Echocardiography  Patient:    Aariyah, Sampey MR #:       [id number] Study Date: 06/11/2016 Gender:     F Age:        42 Height:     162.6 cm Weight:     62 kg BSA:        1.68 m^2 Pt. Status: Room:   Jetty Duhamel, M.D.  REFERRING    [name], M.D.  ATTENDING    Loralie Champagne, M.D.  PERFORMING   [facility], Outpatient  SONOGRAPHER  Bethany McMahill, RDCS  cc:  ------------------------------------------------------------------- LV EF: 60% -   65%  ------------------------------------------------------------------- Indications:      TIA (G45.9).  ------------------------------------------------------------------- History:   PMH:   Coronary artery disease.  Stroke.  Risk factors: Family history of  coronary artery disease. Former tobacco use. Hypertension. Dyslipidemia.  ------------------------------------------------------------------- Study Conclusions  - Left ventricle: The cavity size was normal. Wall thickness was   increased in a pattern of mild LVH. Systolic function was normal.   The estimated ejection fraction was in the range of 60% to 65%.   Wall motion was normal; there were no regional wall motion   abnormalities. Doppler parameters are consistent with abnormal   left ventricular relaxation (grade 1 diastolic dysfunction).   Normal strain pattern, GLS -22.7%. - Aortic valve: There was no stenosis. - Mitral valve: Mildly calcified annulus. There was no significant   regurgitation. - Left atrium: The atrium was mildly dilated. - Right ventricle: The cavity size was normal. Systolic function   was normal. - Pulmonary arteries: PA peak pressure: 19 mm Hg (S). - Inferior vena cava: The vessel was normal in size. The   respirophasic diameter changes were in the normal range (= 50%),   consistent with normal central venous pressure.  Impressions:  - Normal LV size and systolic function, EF 21-19%. Mild LV   hypertrophy. Normal RV size and systolic function. No significant   valvular abnormalities.   ASSESSMENT & PLAN:   84 y.o. Caucasian female with ECOG performance status of 2 with  1) Classical Hodgkin's lymphoma of nodular sclerosis variety Atleast Stage IIIB some concern for possible Rt lung involvement which make in Stage IVB Presented with right neck lymph nodes . She had type B constitutional symptoms with 15-20 pound weight loss night sweats or chills .noted to have significant pruritus with mild rash likely from Hodgkin's which have all resolved   Wt Readings from Last 3 Encounters:  01/06/18 124 lb 6.4 oz (56.4 kg)  11/24/17 121 lb (54.9 kg)  10/07/17 119 lb 12.8 oz (54.3 kg)  - her constitutional symptoms, pruritus have resolved. Rt neck  swelling Appears to have resolved. PET/CT scan after 5 cycles shows marked response to chemotherapy and stable predominantly [place] 2 as per discussion with the radiology. -Follow up in 3 months with labs   2) Microcytic anemia - likely anemia of chronic disease due to Hodgkin's lymphoma .  No evidence of iron def. Improved after PRBC transfusion . Stable. Now partly from chemotherapy -hgb stable at 12.5 as of today, 04/07/39 3) Neutrophilic leucocytosis - likely from steroid and Neulasta. No evidence of infection. Now resolved. PLAN - labs stable. No prohibitive toxicities.  -patient has no clinical or lab evidence of Hodgkins lymphoma progression/recurrence at this time.  4) Rash and pruritus  -previously had Sweet syndrome from neulasta- which resolved. Currently has some pruritus without rash from Xeroderma - I previously informed her to avoid long, hot showers and pat dry to avoid dry skin. I again urged her to consider using moisturizing lotions over these areas to aid with this.  -she was also suggested using Sarna Ointment for pruritus  5) T10 compression fracture based on imaging done on 05/22/2017 in ED Now status post kyphoplasty with significant improvement in her back pain . PET/CT scan also shows possible L4 compression fracture . These compression fractures appear to be related to osteoporosis . She was previously on Fosamax that cause generalized body aches .  Plan -no clinical evidence for  CHL progression at ths time -Continue follow-up with primary care physician to optimize treatment for her osteoporosis. Vit d  and possible consideration of parenteral bisphosphonates versus Prolia. -Pain management as per primary care physician in orthopedics .  6) recently noted triple-vessel coronary artery disease based on cardiac cath patient is surprisingly asymptomatic. -Continue management as per PCP and cardiology.  7) . Patient Active Problem List   Diagnosis Date Noted    . Vitamin D deficiency 11/24/2017  . Glucose intolerance (impaired glucose tolerance) 11/24/2017  . Osteoarthritis 09/16/2017  . History of iron deficiency anemia-  by ONC 07/23/2017  . Osteoporosis 07/23/2017  . CAD S/P percutaneous coronary angioplasty 07/02/2017  . Closed compression fracture of L2 lumbar vertebra ([place]) 06/24/2017  . Abnormal nuclear cardiac imaging test 06/16/2017  . Malnutrition of moderate degree 04/27/2017  . Symptomatic anemia 04/26/2017  . Hodgkin lymphoma ([place]) 04/26/2017  . Port catheter in place 02/02/2017  . Nodular sclerosis Hodgkin lymphoma of lymph nodes of neck ([place]) 01/13/2017  . Dyspnea 09/14/2012  . Epistaxis 06/02/2011  . HYPERCHOLESTEROLEMIA, MIXED 01/08/2009  . Essential hypertension, benign 01/08/2009  . Coronary atherosclerosis- sees Dr. Johnsie Cancel 01/08/2009  . Hypothyroidism 01/08/2009  PLAN -Continue follow-up with primary care physician for management of other chronic medical co-morbidities.  RTC with Dr [name] in 3 months with labs   All of the patients questions were answered with apparent satisfaction. The patient knows to call the clinic with any problems, questions or concerns.  I spent 20 minutes counseling the patient face to face. The total time spent in the appointment was 20 minutes and more than 50% was on counseling and direct patient cares and multiple phone calls to co-ordinate her cares.    Sullivan Lone MD [place] AAHIVMS [facility] Hematology/Oncology Physician [place]  (Office):       [phone] (Work cell):  [phone] (Fax):           [phone]  This document serves as a record of services personally performed by Sullivan Lone, MD. It was created on his behalf by Steva Colder, a trained medical scribe. The creation of this record is based on the scribe's personal observations and the provider's statements to them.   .I have reviewed the above documentation for accuracy and completeness, and I agree with  the above. Brunetta Genera MD MS

## 2018-01-06 ENCOUNTER — Telehealth: Payer: Self-pay | Admitting: Hematology

## 2018-01-06 ENCOUNTER — Inpatient Hospital Stay: Payer: Medicare Other

## 2018-01-06 ENCOUNTER — Encounter: Payer: Self-pay | Admitting: Hematology

## 2018-01-06 ENCOUNTER — Inpatient Hospital Stay: Payer: Medicare Other | Attending: Hematology | Admitting: Hematology

## 2018-01-06 VITALS — BP 122/60 | HR 68 | Temp 98.5°F | Resp 20 | Ht 64.0 in | Wt 124.4 lb

## 2018-01-06 DIAGNOSIS — L299 Pruritus, unspecified: Secondary | ICD-10-CM

## 2018-01-06 DIAGNOSIS — D509 Iron deficiency anemia, unspecified: Secondary | ICD-10-CM | POA: Insufficient documentation

## 2018-01-06 DIAGNOSIS — D72829 Elevated white blood cell count, unspecified: Secondary | ICD-10-CM | POA: Diagnosis not present

## 2018-01-06 DIAGNOSIS — I2510 Atherosclerotic heart disease of native coronary artery without angina pectoris: Secondary | ICD-10-CM | POA: Insufficient documentation

## 2018-01-06 DIAGNOSIS — Z95828 Presence of other vascular implants and grafts: Secondary | ICD-10-CM

## 2018-01-06 DIAGNOSIS — M810 Age-related osteoporosis without current pathological fracture: Secondary | ICD-10-CM

## 2018-01-06 DIAGNOSIS — C8110 Nodular sclerosis classical Hodgkin lymphoma, unspecified site: Secondary | ICD-10-CM | POA: Insufficient documentation

## 2018-01-06 DIAGNOSIS — C8111 Nodular sclerosis classical Hodgkin lymphoma, lymph nodes of head, face, and neck: Secondary | ICD-10-CM

## 2018-01-06 DIAGNOSIS — C8190 Hodgkin lymphoma, unspecified, unspecified site: Secondary | ICD-10-CM

## 2018-01-06 LAB — COMPREHENSIVE METABOLIC PANEL
ALT: 16 U/L (ref 0–55)
AST: 19 U/L (ref 5–34)
Albumin: 3.9 g/dL (ref 3.5–5.0)
Alkaline Phosphatase: 72 U/L (ref 40–150)
Anion gap: 7 (ref 3–11)
BUN: 19 mg/dL (ref 7–26)
CO2: 28 mmol/L (ref 22–29)
Calcium: 9.5 mg/dL (ref 8.4–10.4)
Chloride: 106 mmol/L (ref 98–109)
Creatinine, Ser: 0.89 mg/dL (ref 0.60–1.10)
GFR calc Af Amer: >60 mL/min (ref >60)
GFR calc non Af Amer: 59 mL/min — ABNORMAL LOW (ref >60)
Glucose, Bld: 87 mg/dL (ref 70–140)
Potassium: 4.4 mmol/L (ref 3.3–4.7)
Sodium: 141 mmol/L (ref 136–145)
Total Bilirubin: 0.4 mg/dL (ref 0.2–1.2)
Total Protein: 7.1 g/dL (ref 6.4–8.3)

## 2018-01-06 LAB — CBC WITH DIFFERENTIAL (CANCER CENTER ONLY)
Basophils Absolute: 0.1 10*3/uL (ref 0.0–0.1)
Basophils Relative: 1 %
Eosinophils Absolute: 0.5 10*3/uL (ref 0.0–0.5)
Eosinophils Relative: 8 %
HCT: 38.6 % (ref 34.8–46.6)
Hemoglobin: 12.5 g/dL (ref 11.6–15.9)
Lymphocytes Relative: 19 %
Lymphs Abs: 1.3 10*3/uL (ref 0.9–3.3)
MCH: 29.5 pg (ref 25.1–34.0)
MCHC: 32.4 g/dL (ref 31.5–36.0)
MCV: 91 fL (ref 79.5–101.0)
Monocytes Absolute: 0.6 10*3/uL (ref 0.1–0.9)
Monocytes Relative: 9 %
Neutro Abs: 4.3 10*3/uL (ref 1.5–6.5)
Neutrophils Relative %: 63 %
Platelet Count: 342 10*3/uL (ref 145–400)
RBC: 4.24 MIL/uL (ref 3.70–5.45)
RDW: 14.3 % (ref 11.2–16.1)
WBC Count: 6.8 10*3/uL (ref 3.9–10.3)

## 2018-01-06 LAB — RETICULOCYTES
RBC.: 4.24 MIL/uL (ref 3.70–5.45)
Retic Count, Absolute: 38.2 10*3/uL (ref 33.7–90.7)
Retic Ct Pct: 0.9 % (ref 0.7–2.1)

## 2018-01-06 LAB — SEDIMENTATION RATE: Sed Rate: 16 mm/hr (ref 0–22)

## 2018-01-06 NOTE — Telephone Encounter (Signed)
Scheduled appt per 11/7 los - Gave patient AVS and calender per los.  

## 2018-01-11 ENCOUNTER — Telehealth: Payer: Self-pay | Admitting: *Deleted

## 2018-01-11 NOTE — Telephone Encounter (Signed)
Received call from pt stating the drug store "would not refill my iron" Reviewed with Dr. [name], no refills given.  Per Dr. [name], pt is no longer iron deficient and does not need to refill at this time.  Reviewed with patient, pt verbalized understanding.

## 2018-01-18 ENCOUNTER — Other Ambulatory Visit: Payer: Self-pay

## 2018-01-18 MED ORDER — POLYSACCHARIDE IRON COMPLEX 150 MG PO CAPS: 150.0000 mg | ORAL_CAPSULE | Freq: Every day | ORAL | 0 refills | Status: DC

## 2018-01-26 ENCOUNTER — Ambulatory Visit: Payer: Medicare Other | Admitting: Family Medicine

## 2018-01-26 ENCOUNTER — Encounter: Payer: Self-pay | Admitting: Family Medicine

## 2018-01-26 VITALS — BP 137/88 | HR 69 | Ht 64.0 in | Wt 124.0 lb

## 2018-01-26 DIAGNOSIS — M6530 Trigger finger, unspecified finger: Secondary | ICD-10-CM | POA: Insufficient documentation

## 2018-01-26 DIAGNOSIS — L232 Allergic contact dermatitis due to cosmetics: Secondary | ICD-10-CM | POA: Insufficient documentation

## 2018-01-26 DIAGNOSIS — M659 Synovitis and tenosynovitis, unspecified: Secondary | ICD-10-CM

## 2018-01-26 DIAGNOSIS — I10 Essential (primary) hypertension: Secondary | ICD-10-CM

## 2018-01-26 DIAGNOSIS — M19049 Primary osteoarthritis, unspecified hand: Secondary | ICD-10-CM | POA: Insufficient documentation

## 2018-01-26 MED ORDER — TRIAMCINOLONE ACETONIDE 0.1 % EX CREA: 1.0000 "application " | TOPICAL_CREAM | Freq: Two times a day (BID) | CUTANEOUS | 0 refills | Status: DC

## 2018-01-26 MED ORDER — CARVEDILOL 3.125 MG PO TABS: ORAL_TABLET | ORAL | 1 refills | Status: DC

## 2018-01-26 NOTE — Patient Instructions (Addendum)
Since you were on metoprolol every other day with stable BP's, we will do carvedilol every other day for now. Continue to monitor BP and keep a log to bring in for next OV.  And use cream on skin as needed itch.  Let me know if not resolved with stopping the new body cream as well.       Contact Dermatitis  Dermatitis is redness, soreness, and swelling (inflammation) of the skin. Contact dermatitis is a reaction to certain substances that touch the skin. There are two types of contact dermatitis:  Irritant contact dermatitis. This type is caused by something that irritates your skin, such as dry hands from washing them too much. This type does not require previous exposure to the substance for a reaction to occur. This type is more common.  Allergic contact dermatitis. This type is caused by a substance that you are allergic to, such as a nickel allergy or poison ivy. This type only occurs if you have been exposed to the substance (allergen) before. Upon a repeat exposure, your body reacts to the substance. This type is less common.  What are the causes? Many different substances can cause contact dermatitis. Irritant contact dermatitis is most commonly caused by exposure to:  Makeup.  Soaps.  Detergents.  Bleaches.  Acids.  Metal salts, such as nickel.  Allergic contact dermatitis is most commonly caused by exposure to:  Poisonous plants.  Chemicals.  Jewelry.  Latex.  Medicines.  Preservatives in products, such as clothing.  What increases the risk? This condition is more likely to develop in:  People who have jobs that expose them to irritants or allergens.  People who have certain medical conditions, such as asthma or eczema.  What are the signs or symptoms? Symptoms of this condition may occur anywhere on your body where the irritant has touched you or is touched by you. Symptoms include:  Dryness or flaking.  Redness.  Cracks.  Itching.  Pain or a  burning feeling.  Blisters.  Drainage of small amounts of blood or clear fluid from skin cracks.  With allergic contact dermatitis, there may also be swelling in areas such as the eyelids, mouth, or genitals. How is this diagnosed? This condition is diagnosed with a medical history and physical exam. A patch skin test may be performed to help determine the cause. If the condition is related to your job, you may need to see an occupational medicine specialist. How is this treated? Treatment for this condition includes figuring out what caused the reaction and protecting your skin from further contact. Treatment may also include:  Steroid creams or ointments. Oral steroid medicines may be needed in more severe cases.  Antibiotics or antibacterial ointments, if a skin infection is present.  Antihistamine lotion or an antihistamine taken by mouth to ease itching.  A bandage (dressing).  Follow these instructions at home: [place] your skin as needed.  Apply cool compresses to the affected areas.  Try taking a bath with: ? Epsom salts. Follow the instructions on the packaging. You can get these at your local pharmacy or grocery store. ? Baking soda. Pour a small amount into the bath as directed by your health care provider. ? Colloidal oatmeal. Follow the instructions on the packaging. You can get this at your local pharmacy or grocery store.  Try applying baking soda paste to your skin. Stir water into baking soda until it reaches a paste-like consistency.  Do not scratch your skin.  Bathe less frequently, such as every other day.  Bathe in lukewarm water. Avoid using hot water. Medicines  Take or apply over-the-counter and prescription medicines only as told by your health care provider.  If you were prescribed an antibiotic medicine, take or apply your antibiotic as told by your health care provider. Do not stop using the antibiotic even if your condition starts  to improve. General instructions  Keep all follow-up visits as told by your health care provider. This is important.  Avoid the substance that caused your reaction. If you do not know what caused it, keep a journal to try to track what caused it. Write down: ? What you eat. ? What cosmetic products you use. ? What you drink. ? What you wear in the affected area. This includes jewelry.  If you were given a dressing, take care of it as told by your health care provider. This includes when to change and remove it. Contact a health care provider if:  Your condition does not improve with treatment.  Your condition gets worse.  You have signs of infection such as swelling, tenderness, redness, soreness, or warmth in the affected area.  You have a fever.  You have new symptoms. Get help right away if:  You have a severe headache, neck pain, or neck stiffness.  You vomit.  You feel very sleepy.  You notice red streaks coming from the affected area.  Your bone or joint underneath the affected area becomes painful after the skin has healed.  The affected area turns darker.  You have difficulty breathing. This information is not intended to replace advice given to you by your health care provider. Make sure you discuss any questions you have with your health care provider. Document Released: 12/04/2000 Document Revised: 05/14/2016 Document Reviewed: 04/24/2015 Elsevier Interactive Patient Education  2018 [employer].

## 2018-01-26 NOTE — Progress Notes (Signed)
Impression and Recommendations:    1. Essential hypertension, benign   2. Allergic contact dermatitis due to cosmetics / tried new cream   3. Tenosynovitis of fingers   4. Osteoarthritis of finger, unspecified laterality    1. Essential hypertension: From last OV, we switched from metoprolol every other day to carvedilol 0.5 tablet every day. Her BP's are stable and well-controlled. Pt is compliant with her meds and tolerating them well, but prefers to take this every other day. We will change her medication dose to start this one full tablet every morning, every other day. Continue checking your BP at home and keeping a log. Bring this into next OV.  -If you develop side effects and symptoms due to this medication dose, contact the office and let us know.  2. Allergic contact dermatitis due to cosmetics- stop using the lotion that previously caused your skin conditions. Start meds for itch, use as directed below. Take OTC allergy medications for symptoms as-needed. Contact the office if this does not resolve after stopping the lotion that you used previously.  3. Tenosynovitis of fingers with OA- we offered a referral to orthopedics for further evaluation but pt declined. She will let us know if this worsens and if she changes her mind. Education and handouts provided.  4. Osteoarthritis of finger, unspecific laterality-  -Follow up in 4 months to discuss BP and chronic care management.   -Recheck A1c in 4 months. Encouraged pt to continue her diet and exercise.   Education and routine counseling performed. Handouts provided.   Meds ordered this encounter  Medications  . triamcinolone cream (KENALOG) 0.1 %    Sig: Apply 1 application topically 2 (two) times daily.    Dispense:  60 g    Refill:  0  . carvedilol (COREG) 3.125 MG tablet    Sig: 1 tab every other morning    Dispense:  90 tablet    Refill:  1    The patient was counseled, risk factors were discussed,  anticipatory guidance given.  Gross side effects, risk and benefits, and alternatives of medications discussed with patient.  Patient is aware that all medications have potential side effects and we are unable to predict every side effect or drug-drug interaction that may occur.  Expresses verbal understanding and consents to current therapy plan and treatment regimen.  Return in about 4 months (around 05/26/2018) for Hypertension follow up every 4 mo; sooner if problems.  Please see AVS handed out to patient at the end of our visit for further patient instructions/ counseling done pertaining to today's office visit.    Note: This document was prepared using Dragon voice recognition software and may include unintentional dictation errors.  This document serves as a record of services personally performed by [name], DO. It was created on her behalf by Mayer Masker, a trained medical scribe. The creation of this record is based on the scribe's personal observations and the provider's statements to them.   I have reviewed the above medical documentation for accuracy and completeness and I concur.  [name] 02/23/18 2:58 PM    Subjective:    HPI: SADEEL FIDDLER is a 84 y.o. female who presents to [place] [facility] today for follow up after changing blood pressure medication.    From last OV, we switched her from taking metoprolol every other day to carvedilol 0.5 tablets every day. We initially switched her so she could take this every  day and because of her h/o CAD. She states her pills are very small and difficult to cut in half. She is compliant and tolerating this well without complication. She takes this every morning.  She brought a BP log in from home: avg readings are: 134/78, 131/88, 115/70, 143/96, 128/84, 124/71, 129/87, 136/81, 134/82, 135/71.  They are occasionally 150/90, 105/62.  HTN:  -  Her blood pressure has been controlled at home.    - Patient reports good compliance with blood pressure medications  - Denies medication S-E   - Smoking Status noted   - She denies new onset of: chest pain, exercise intolerance, shortness of breath, dizziness, visual changes, headache, lower extremity swelling or claudication.   Skin: She felt itchy on her arms and legs about 1 week ago. She has dry skin in the area. She did not change detergents but notes she changed lotions recently.  Sleep disturbances: She feels good being on her new BP medications for the most part but states she is not sleeping well. She takes melatonin 2.5mg  once in a while. Some nights she sleeps well, and other nights she does not and states she is "up and down".   Finger She states her R middle finger hurts when she wakes up. She has to hold it up in order to "pop" it back into place at times. It is much stiffer in the morning and hard to bend, but easier to bend and it loosens up as the day goes on.  Shoulder: She states she wants to go to a chiropractor because she went to an orthopedist and they did not help her. She has been seeing her chiropractor before "for years". She declined further evaluation by [place] or referral.  Oncology: She states her visits with her oncologist are going well and she reports good test results and prognoses. She gets regular blood work at her oncologist.  Exercise She states she is aware that she is not exercising as much as she wants to.  She is going to start a daily walking plan.   Pre-DM She states she ate a lot of sweets and other foods during the holidays. She denies any new symptoms related to her pre diabetes.  Last 3 blood pressure readings in our office are as follows: BP Readings from Last 3 Encounters:  01/26/18 137/88  01/06/18 122/60  11/24/17 133/76    Pulse Readings from Last 3 Encounters:  01/26/18 69  01/06/18 68  11/24/17 90    Filed Weights   01/26/18 0918  Weight: 124 lb (56.2 kg)       Patient Care Team    Relationship Specialty Notifications Start End  [name], DO PCP - General Family Medicine  07/02/17   Josue Hector, MD Consulting Physician Cardiology  07/02/17   Penni Bombard, MD Consulting Physician Neurology  07/02/17   Jarome Matin, MD Consulting Physician Dermatology  07/02/17   Rozetta Nunnery, MD Consulting Physician Otolaryngology  07/02/17   Brunetta Genera, MD Consulting Physician Hematology  07/02/17   Melina Schools, MD Consulting Physician Orthopedic Surgery  07/02/17   Josue Hector, MD Consulting Physician Cardiology  09/16/17      Lab Results  Component Value Date   CREATININE 0.89 01/06/2018   BUN 19 01/06/2018   NA 141 01/06/2018   K 4.4 01/06/2018   CL 106 01/06/2018   CO2 28 01/06/2018    Lab Results  Component Value Date   CHOL 250 (H)  09/08/2017   CHOL 181 09/08/2016   CHOL 178 06/05/2016    Lab Results  Component Value Date   HDL 59 09/08/2017   HDL 52 09/08/2016   HDL 48 06/05/2016    Lab Results  Component Value Date   LDLCALC 170 (H) 09/08/2017   LDLCALC 115 09/08/2016   LDLCALC 119 06/05/2016    Lab Results  Component Value Date   TRIG 107 09/08/2017   TRIG 69 09/08/2016   TRIG 56 06/05/2016    Lab Results  Component Value Date   CHOLHDL 4.2 09/08/2017   CHOLHDL 3.6 04/20/2016    No results found for: LDLDIRECT ===================================================================   Patient Active Problem List   Diagnosis Date Noted  . Hodgkin lymphoma ([place]) 04/26/2017    Priority: High  . Essential hypertension, benign 01/08/2009    Priority: High  . Coronary atherosclerosis- sees Dr. Johnsie Cancel 01/08/2009    Priority: High  . Closed compression fracture of L2 lumbar vertebra ([place]) 06/24/2017    Priority: Medium  . Symptomatic anemia 04/26/2017    Priority: Medium  . HYPERCHOLESTEROLEMIA, MIXED 01/08/2009    Priority: Medium  . Hypothyroidism 01/08/2009    Priority:  Medium  . Osteoporosis 07/23/2017    Priority: Low  . CAD S/P percutaneous coronary angioplasty 07/02/2017    Priority: Low  . Allergic contact dermatitis due to cosmetics 01/26/2018  . Trigger finger, acquired- R 3rd digit 01/26/2018  . Osteoarthritis of finger 01/26/2018  . Vitamin D deficiency 11/24/2017  . Glucose intolerance (impaired glucose tolerance) 11/24/2017  . Osteoarthritis 09/16/2017  . History of iron deficiency anemia-  by ONC 07/23/2017  . Abnormal nuclear cardiac imaging test 06/16/2017  . Malnutrition of moderate degree 04/27/2017  . Port catheter in place 02/02/2017  . Nodular sclerosis Hodgkin lymphoma of lymph nodes of neck ([place]) 01/13/2017  . Dyspnea 09/14/2012  . Epistaxis 06/02/2011     Past Medical History:  Diagnosis Date  . Anemia   . Anginal pain ([place])    occ  . Blood dyscrasia 12/2016   hodgins lymphoma tx-remission  . CAD (coronary artery disease) 02/2006   Taxus stent placed to LAD and Diagonal per Dr. Olevia Perches  . Cataracts, bilateral   . Complication of anesthesia   . HTN (hypertension)   . Hypercholesterolemia   . Hypothyroidism   . IBS (irritable bowel syndrome)   . Osteoarthritis   . PONV (postoperative nausea and vomiting)   . Stroke [facility]) 03/2016     Past Surgical History:  Procedure Laterality Date  . APPENDECTOMY    . IR GENERIC HISTORICAL  01/25/2017   IR US GUIDE VASC ACCESS RIGHT 01/25/2017 [name], MD [facility]-INTERV RAD  . IR GENERIC HISTORICAL  01/25/2017   IR FLUORO GUIDE PORT INSERTION RIGHT 01/25/2017 [name], MD [facility]-INTERV RAD  . IR REMOVAL TUN ACCESS W/ PORT W/O FL MOD SED  10/28/2017  . KYPHOPLASTY N/A 06/24/2017   Procedure: KYPHOPLASTY T10;  Surgeon: Melina Schools, MD;  Location: [place];  Service: Orthopedics;  Laterality: N/A;  90 mins  . LEFT HEART CATH AND CORONARY ANGIOGRAPHY N/A 06/16/2017   Procedure: Left Heart Cath and Coronary Angiography;  Surgeon: Sherren Mocha, MD;  Location: [place] [facility];  Service:  Cardiovascular;  Laterality: N/A;  . MASS EXCISION Right 12/22/2016   Procedure: RIGHT NECK LYMPH NODE BIOPSY;  Surgeon: Rozetta Nunnery, MD;  Location: [place];  Service: ENT;  Laterality: Right;  . SPINE SURGERY    . stents  in heart  . TONSILLECTOMY    . TOTAL KNEE ARTHROPLASTY Bilateral 2011,2012  . VAGINAL HYSTERECTOMY       Family History  Problem Relation Age of Onset  . Diabetes Mother   . Dementia Mother   . ALS Mother   . Heart Problems Father   . Liver disease Sister   . Cirrhosis Sister   . Heart block Unknown        CABG  . Diabetes Sister   . Heart block Son        CABG  . Other [place]  . Other Daughter        [place]  . Dementia Sister      Social History   Substance and Sexual Activity  Drug Use No  ,  Social History   Substance and Sexual Activity  Alcohol Use No  . Alcohol/week: 0.0 oz  ,  Social History   Tobacco Use  Smoking Status Former Smoker  Smokeless Tobacco Never Used  Tobacco Comment   quit smoking 30 years ago, very light smoker  ,    Current Outpatient Medications on File Prior to Visit  Medication Sig Dispense Refill  . aspirin 81 MG tablet Take 81 mg by mouth daily.      . Cholecalciferol (VITAMIN D3) 1000 units CAPS Take 1 capsule by mouth daily.    . Coenzyme Q10 100 MG capsule Take 100 mg by mouth daily.     . Glucosamine 750 MG TABS Take 1 tablet by mouth daily.    . iron polysaccharides (NIFEREX) 150 MG capsule Take 1 capsule (150 mg total) by mouth daily. 30 capsule 0  . lactose free nutrition (BOOST PLUS) LIQD Take 237 mLs by mouth 3 (three) times daily with meals. 30 Can 0  . levothyroxine (SYNTHROID, LEVOTHROID) 88 MCG tablet Take 88 mcg by mouth daily before breakfast.     . Melatonin 2.5 MG CAPS Take 1 capsule by mouth at bedtime.    . Multiple Vitamins-Minerals (CENTRUM SILVER PO) Take 1 tablet by mouth daily.      . nitroGLYCERIN (NITROLINGUAL) 0.4 MG/SPRAY  spray Place 1 spray under the tongue every 5 (five) minutes x 3 doses as needed for chest pain (3 sprays max).    . Omega-3 Fatty Acids (FISH OIL) 1000 MG CAPS Take 1 capsule by mouth daily.     [name] Kitchen PROAIR HFA 108 (90 Base) MCG/ACT inhaler Inhale 2 puffs into the lungs every 4 (four) hours as needed for shortness of breath.     . RESTASIS 0.05 % ophthalmic emulsion Place 2 drops into both eyes 2 (two) times daily. For dry eyes    . traZODone (DESYREL) 50 MG tablet Take 0.5-1 tablets (25-50 mg total) by mouth at bedtime as needed for sleep. 30 tablet 0  . Vitamin D, Ergocalciferol, (DRISDOL) 50000 units CAPS capsule Take 1 capsule (50,000 Units total) by mouth every 7 (seven) days. 12 capsule 3   No current facility-administered medications on file prior to visit.      Allergies  Allergen Reactions  . Fosamax [Alendronate Sodium] Other (See Comments)    "made me ache all over"     Review of Systems:   General:  Denies fever, chills Optho/Auditory:   Denies visual changes, blurred vision Respiratory:   Denies SOB, cough, wheeze, DIB  Cardiovascular:   Denies chest pain, palpitations, painful respirations Gastrointestinal:   Denies nausea, vomiting, diarrhea.  Endocrine:     Denies new hot or cold intolerance Musculoskeletal:  Denies joint swelling, gait issues, or new unexplained myalgias/ arthralgias Skin:  Denies rash, suspicious lesions  Neurological:    Denies dizziness, unexplained weakness, numbness  Psychiatric/Behavioral:   Denies mood changes  Objective:    Blood pressure 137/88, pulse 69, height 5\' 4"  (1.626 m), weight 124 lb (56.2 kg), SpO2 97 %.  Body mass index is 21.28 kg/m.  General: Well Developed, well nourished, and in no acute distress.  HEENT: Normocephalic, atraumatic, pupils equal round reactive to light, neck supple, No carotid bruits, no JVD Skin: Warm and dry, cap RF less 2 sec. Erythematous macules approx. 1-2 mm diameter on bilateral arms and  legs. Cardiac: Regular rate and rhythm, S1, S2 WNL's, no murmurs rubs or gallops Respiratory: ECTA B/L, Not using accessory muscles, speaking in full sentences. NeuroM-Sk: Ambulates w/o assistance, moves ext * 4 w/o difficulty, sensation grossly intact.  Ext: scant edema b/l lower ext Psych: No HI/SI, judgement and insight good, Euthymic mood. Full Affect. Finger: R middle, unable to fully extend last 15 degrees. Nontender to palpation, no joint swelling or finger swelling appreciated, no TTP.

## 2018-02-03 ENCOUNTER — Telehealth: Payer: Self-pay | Admitting: Medical Oncology

## 2018-02-03 NOTE — Telephone Encounter (Signed)
Confused about does she need to take iron tablets . I told her no.

## 2018-02-14 ENCOUNTER — Telehealth: Payer: Self-pay | Admitting: Family Medicine

## 2018-02-14 NOTE — Telephone Encounter (Signed)
Called patient, she states that she is having cramps in the hands in the morning and in feet at night.  Patient started new dose of the medication 10 days and that was when symptoms started. Patient denies any chest pains, SOB, Has or swelling.  Patient states that BP has been 120-140/70-80.  Spoke to Dr. Raliegh Scarlet, she approved changing medication back to metoprolol.  Informed the patient and she wants to finish the 5 days that she has left and see if symptoms improve and will call if no improvement.  Patient advised to call tomorrow before taking the medication and let me know if symptoms are the same. MPulliam, CMA/RT(R)

## 2018-02-14 NOTE — Telephone Encounter (Signed)
Pt called states thinks new medicine (Coreg) is causing side effects of severe hand soreness(both hands) & cramps in feet ---patient wishes provider or nurse to call her before end of day to discuss what is happening to her--- Pt states original dosage was 1/2 tab (pill too small to cut in half) & was told to just take  (1) pill every other day--- Side effects began at One pill dosage.  ---Pls call patient at [phone]

## 2018-02-28 ENCOUNTER — Telehealth: Payer: Self-pay | Admitting: Family Medicine

## 2018-02-28 ENCOUNTER — Other Ambulatory Visit: Payer: Self-pay | Admitting: Family Medicine

## 2018-02-28 DIAGNOSIS — I10 Essential (primary) hypertension: Secondary | ICD-10-CM

## 2018-02-28 NOTE — Telephone Encounter (Signed)
Called and spoke to patient she wants to continue on the medication. MPulliam, CMA/RT(R)

## 2018-02-28 NOTE — Telephone Encounter (Signed)
Patient wants to speak with Dr. Daisy Floro nurse about her carvedilol and if she needs to continue this med. If so we wants to ask about her refills, she is going to contact [facility] again to see if there is a refill (based on our documentation she should), but she wants to ask a few questions about the meds in the mean time

## 2018-03-10 ENCOUNTER — Encounter: Payer: Self-pay | Admitting: Cardiovascular Disease

## 2018-03-15 NOTE — Progress Notes (Addendum)
Patient ID: Kim Lane, female   DOB: April 13, 1934, 84 y.o.   MRN: [id number]   84 y.o. f/u for CAD had high risk myovue 06/10/17 for preoperative back surgery EF 58% Ischemia in the apical and anterior regions F/u cath done by Dr Burt Knack reviewed   Conclusion   1. Severe three-vessel coronary artery disease with:  Severe mid LAD and first diagonal stenosis with continued patency of the stented segment in the mid LAD  Severe ostial left circumflex and first OM stenoses  Severe proximal RCA and posterior AV segment stenoses  2. Normal LV function by nuclear assessment     She was allowed to have her back surgery with Dr Rolena Infante and had no cardiac complications and good pain relief  Follows with Oncology for Hodgkin's Lymphoma stable at this point Also on thyroid replacement She has some left sided Rib pain but no angina Has nitro spray. Thinks she strained her ribs getting on floor to fix her vacuum cleaner  ROS: Denies fever, malais, weight loss, blurry vision, decreased visual acuity, cough, sputum, SOB, hemoptysis, pleuritic pain, palpitaitons, heartburn, abdominal pain, melena, lower extremity edema, claudication, or rash.  All other systems reviewed and negative  General: BP 120/78   Pulse 79   Ht 5\' 4"  (1.626 m)   Wt 123 lb 9.6 oz (56.1 kg)   SpO2 96%   BMI 21.22 kg/m  Elderly female Frail  Healthy:  appears stated age 24: normal Neck supple with no adenopathy JVP normal no bruits no thyromegaly Lungs clear with no wheezing and good diaphragmatic motion Heart:  S1/S2 SEM  murmur, no rub, gallop or click PMI normal Abdomen: benighn, BS positve, no tenderness, no AAA no bruit.  No HSM or HJR Distal pulses intact with no bruits No edema Neuro non-focal Skin warm and dry No muscular weakness     Current Outpatient Medications  Medication Sig Dispense Refill  . aspirin 81 MG tablet Take 81 mg by mouth daily.      . carvedilol (COREG) 3.125 MG tablet TAKE 1TABLET  BY MOUTH EVERY OTHER MORNING 30 tablet 0  . Cholecalciferol (VITAMIN D3) 1000 units CAPS Take 1 capsule by mouth daily.    . Coenzyme Q10 100 MG capsule Take 100 mg by mouth daily.     [name] Kitchen levothyroxine (SYNTHROID, LEVOTHROID) 88 MCG tablet Take 88 mcg by mouth daily before breakfast.     . Melatonin 2.5 MG CAPS Take 1 capsule by mouth at bedtime.    . Multiple Vitamins-Minerals (CENTRUM SILVER PO) Take 1 tablet by mouth daily.      . nitroGLYCERIN (NITROLINGUAL) 0.4 MG/SPRAY spray Place 1 spray under the tongue every 5 (five) minutes x 3 doses as needed for chest pain (3 sprays max).    . Omega-3 Fatty Acids (FISH OIL) 1000 MG CAPS Take 1 capsule by mouth daily.     [name] Kitchen PROAIR HFA 108 (90 Base) MCG/ACT inhaler Inhale 2 puffs into the lungs every 4 (four) hours as needed for shortness of breath.     . RESTASIS 0.05 % ophthalmic emulsion Place 2 drops into both eyes 2 (two) times daily. For dry eyes    . triamcinolone cream (KENALOG) 0.1 % Apply 1 application topically 2 (two) times daily. 60 g 0  . Vitamin D, Ergocalciferol, (DRISDOL) 50000 units CAPS capsule Take 1 capsule (50,000 Units total) by mouth every 7 (seven) days. 12 capsule 3   No current facility-administered medications for this visit.  Allergies  Fosamax [alendronate sodium]  Electrocardiogram:  12/3/ 2015 SR rate 63 normal ECG no change from 2014  05/25/16  NSR rate 79  Normal eCG   Assessment and Plan CAD: Severe 3VD no angina no complications recent general anesthesia and surgery continue medical Rx and consider intervention only for unstable agnina give age and co morbidities   Thyroid  Continue replacement TSH normal labs with primary   TIA:  Non focal neuro exam for me.  Echo with bubble study negative  ECG totally normal Monitor unrevealing and patient defers LINQ impant at this time  Lymphoma:  Has had chemo and resultant anemia needing transfusion PET scan reassuring f/u oncology Dr [name]   Back:  Post kyphoplasty  with good pain relief. Compression fractures ? Due to osteopenia did not tolerate fosamax in past f/u primary   Rib Pain: suspect costochondral strain  continue tylenol can f/u with primary for films if not improved in a week or 2    [employer]

## 2018-03-21 ENCOUNTER — Encounter (INDEPENDENT_AMBULATORY_CARE_PROVIDER_SITE_OTHER): Payer: Self-pay

## 2018-03-21 ENCOUNTER — Ambulatory Visit: Payer: Medicare Other | Admitting: Cardiovascular Disease

## 2018-03-21 ENCOUNTER — Encounter: Payer: Self-pay | Admitting: Cardiovascular Disease

## 2018-03-21 VITALS — BP 120/78 | HR 79 | Ht 64.0 in | Wt 123.6 lb

## 2018-03-21 DIAGNOSIS — I2510 Atherosclerotic heart disease of native coronary artery without angina pectoris: Secondary | ICD-10-CM | POA: Diagnosis not present

## 2018-03-21 NOTE — Patient Instructions (Signed)

## 2018-04-05 ENCOUNTER — Ambulatory Visit: Payer: Medicare Other | Admitting: Family Medicine

## 2018-04-05 ENCOUNTER — Ambulatory Visit: Payer: Medicare Other

## 2018-04-05 ENCOUNTER — Encounter: Payer: Self-pay | Admitting: Family Medicine

## 2018-04-05 VITALS — BP 111/74 | HR 76 | Ht 64.0 in | Wt 124.7 lb

## 2018-04-05 DIAGNOSIS — R7302 Impaired glucose tolerance (oral): Secondary | ICD-10-CM | POA: Diagnosis not present

## 2018-04-05 DIAGNOSIS — E559 Vitamin D deficiency, unspecified: Secondary | ICD-10-CM

## 2018-04-05 DIAGNOSIS — I10 Essential (primary) hypertension: Secondary | ICD-10-CM | POA: Diagnosis not present

## 2018-04-05 DIAGNOSIS — Z9889 Other specified postprocedural states: Secondary | ICD-10-CM | POA: Diagnosis not present

## 2018-04-05 DIAGNOSIS — S2249XA Multiple fractures of ribs, unspecified side, initial encounter for closed fracture: Secondary | ICD-10-CM | POA: Diagnosis not present

## 2018-04-05 DIAGNOSIS — I2510 Atherosclerotic heart disease of native coronary artery without angina pectoris: Secondary | ICD-10-CM | POA: Diagnosis not present

## 2018-04-05 DIAGNOSIS — Z9861 Coronary angioplasty status: Secondary | ICD-10-CM | POA: Diagnosis not present

## 2018-04-05 DIAGNOSIS — R0781 Pleurodynia: Secondary | ICD-10-CM

## 2018-04-05 DIAGNOSIS — Z780 Asymptomatic menopausal state: Secondary | ICD-10-CM

## 2018-04-05 LAB — POCT GLYCOSYLATED HEMOGLOBIN (HGB A1C): Hemoglobin A1C: 6.1

## 2018-04-05 NOTE — Progress Notes (Signed)
Pt here for an acute care OV today   Impression and Recommendations:    1. Closed fracture of multiple ribs, unspecified laterality, initial encounter   2. Rib pain on left side- acute   3. Glucose intolerance (impaired glucose tolerance)   4. Essential hypertension, benign   5. CAD S/P percutaneous coronary angioplasty   6. S/P kyphoplasty- dr Rolena Infante- ortho   7. Postmenopausal   8. Vitamin D deficiency     1. Rib pain on L side- acute -Showed pt how to splint using a pillow against her ribs.   -Obtain rib films today.    2. Glucose intolerance -A1c today is 6.1. Last A1c on 09-08-2017 was 6.0. Continue watching what you eat and limit intake of sweets.  -continue daily exercise and adhere to prudent diet.   3. Hypertension- -BP has been well-controlled at home. Tolerating meds well without complication. Pt is asymptomatic and stable. -Continue meds. -Check your BP at home and keep a log. Bring this into next OV.   4. CAD S/p percutaneous coronary angioplasty -She sees Dr. Johnsie Cancel, cardiology, who follows her closely. Stable at this time and asymptomatic.  5. S/p kyphoplasty- -Pt sees Dr. Rolena Infante for this. She is stable and doing well.  6. Closed fracture of multiple ribs  -Take OTC tylenol for pain.  -Due to poor visualization of the rib films today, I recommend obtaining a CT scan of chest to better visualize the area.  -Pt prefers to wait 1 week before doing CT scan of chest to see if her symptoms improve.  -If pain worsens or you acutely develop SOB, contact 911 immediately due to potential of lung puncture due to fractured ribs.  -Told pt it may take 3-4 months or longer for her symptoms and pain to resolve fully.   7. Postmenopausal 8. Vitamin D deficiency -Order bone density today. -continue supplements.  No orders of the defined types were placed in this encounter.   Orders Placed This Encounter  Procedures  . DG Ribs Unilateral Left  . DG Bone  Density  . POCT glycosylated hemoglobin (Hb A1C)     Education and routine counseling performed. Handouts provided  Gross side effects, risk and benefits, and alternatives of medications and treatment plan in general discussed with patient.  Patient is aware that all medications have potential side effects and we are unable to predict every side effect or drug-drug interaction that may occur.   Patient will call with any questions prior to using medication if they have concerns.  Expresses verbal understanding and consents to current therapy and treatment regimen.  No barriers to understanding were identified.  Red flag symptoms and signs discussed in detail.  Patient expressed understanding regarding what to do in case of emergency\urgent symptoms   Please see AVS handed out to patient at the end of our visit for further patient instructions/ counseling done pertaining to today's office visit.   Return in about 4 months (around 08/05/2018) for f/up for pre-Dm, bone density, arthritis, new rib fx.     Note: This document was prepared occasionally using Dragon voice recognition software and may include unintentional dictation errors in addition to a scribe.  This document serves as a record of services personally performed by [name], DO. It was created on her behalf by Mayer Masker, a trained medical scribe. The creation of this record is based on the scribe's personal observations and the provider's statements to them.   I have reviewed the above  medical documentation for accuracy and completeness and I concur.  [name] 04/05/18 2:41 PM   --------------------------------------------------------------------------------------------------------------------------------------------------------------------------------------------------------------------------------------------    Subjective:    CC:  Chief Complaint  Patient presents with  . Flank Pain    left side pain  x 7-10 days     HPI: Kim Lane is a 84 y.o. female who presents to [place] at [facility] today for L-side pain.  She had kyphoplasty with Dr Rolena Infante, orthopedist, on 06-24-2017.  She states about 7-10 days ago she bent down while using her vacuum cleaner and when she stood up, her L rib cage was hurting. She states her pain has improved slightly since this morning. She has pain on her L side when she sleeps. She states her pain is occasionally worse when she breathes. The pain occasionally will radiate from her stomach into her back. She denies fall, mechanism of injury, or trauma to the area. She denies SOB. She has put up with the pain recently, until now when she wanted to get it checked out.   BP: Pt's BP's have been 121/68, 106/76, 118/75, 125/81, 153/80, 146/84, 141/70 at home and well-controlled.  She has been taking her medication regularly. She denies dizziness or light-headedness.   Health maintenance She also has a home care for medicare wellness once per year with [employer]. She had this done last week.   Prediabetes Her last A1c was 6.0. Will recheck today. She has been exercising some with [employer]ne[employer].  Bone She has had 2 bone density exams before (1-2 years ago) but does not remember when exactly it was. This was done at [place].   Problem  Glucose Intolerance (Impaired Glucose Tolerance)  Hypothyroidism   Qualifier: Diagnosis of  By: Johnsie Cancel, MD, Rona Ravens  Has been on meds for yrs and yrs   S/P kyphoplasty- dr Rolena Infante- ortho  Postmenopausal  Closed Fracture of Multiple Ribs     Wt Readings from Last 3 Encounters:  04/05/18 124 lb 11.2 oz (56.6 kg)  03/21/18 123 lb 9.6 oz (56.1 kg)  01/26/18 124 lb (56.2 kg)   BP Readings from Last 3 Encounters:  04/05/18 111/74  03/21/18 120/78  01/26/18 137/88   BMI Readings from Last 3 Encounters:  04/05/18 21.40 kg/m  03/21/18 21.22 kg/m  01/26/18 21.28 kg/m       Patient Care Team    Relationship Specialty Notifications Start End  [name], DO PCP - General Family Medicine  07/02/17   Josue Hector, MD Consulting Physician Cardiology  07/02/17   Penni Bombard, MD Consulting Physician Neurology  07/02/17   Jarome Matin, MD Consulting Physician Dermatology  07/02/17   Rozetta Nunnery, MD Consulting Physician Otolaryngology  07/02/17   Brunetta Genera, MD Consulting Physician Hematology  07/02/17   Melina Schools, MD Consulting Physician Orthopedic Surgery  07/02/17   Josue Hector, MD Consulting Physician Cardiology  09/16/17      Patient Active Problem List   Diagnosis Date Noted  . Glucose intolerance (impaired glucose tolerance) 11/24/2017    Priority: High  . Hodgkin lymphoma ([place]) 04/26/2017    Priority: High  . Essential hypertension, benign 01/08/2009    Priority: High  . Coronary atherosclerosis- sees Dr. Johnsie Cancel 01/08/2009    Priority: High  . Hypothyroidism 01/08/2009    Priority: High  . Closed compression fracture of L2 lumbar vertebra 06/24/2017    Priority: Medium  . Symptomatic anemia 04/26/2017    Priority:  Medium  . HYPERCHOLESTEROLEMIA, MIXED 01/08/2009    Priority: Medium  . Osteoporosis 07/23/2017    Priority: Low  . CAD S/P percutaneous coronary angioplasty 07/02/2017    Priority: Low  . S/P kyphoplasty- dr Rolena Infante- ortho 04/05/2018  . Postmenopausal 04/05/2018  . Closed fracture of multiple ribs 04/05/2018  . Allergic contact dermatitis due to cosmetics 01/26/2018  . Trigger finger, acquired- R 3rd digit 01/26/2018  . Osteoarthritis of finger 01/26/2018  . Vitamin D deficiency 11/24/2017  . Osteoarthritis 09/16/2017  . History of iron deficiency anemia-  by ONC 07/23/2017  . Abnormal nuclear cardiac imaging test 06/16/2017  . Malnutrition of moderate degree 04/27/2017  . Port catheter in place 02/02/2017  . Nodular sclerosis Hodgkin lymphoma of lymph nodes of neck ([place]) 01/13/2017   . Dyspnea 09/14/2012  . Epistaxis 06/02/2011    Past Medical history, Surgical history, Family history, Social history, Allergies and Medications have been entered into the medical record, reviewed and changed as needed.    Current Meds  Medication Sig  . aspirin 81 MG tablet Take 81 mg by mouth daily.    . carvedilol (COREG) 3.125 MG tablet TAKE 1TABLET BY MOUTH EVERY OTHER MORNING  . Cholecalciferol (VITAMIN D3) 1000 units CAPS Take 1 capsule by mouth daily.  . Coenzyme Q10 100 MG capsule Take 100 mg by mouth daily.   [name] Kitchen levothyroxine (SYNTHROID, LEVOTHROID) 88 MCG tablet Take 88 mcg by mouth daily before breakfast.   . Melatonin 2.5 MG CAPS Take 1 capsule by mouth at bedtime.  . Multiple Vitamins-Minerals (CENTRUM SILVER PO) Take 1 tablet by mouth daily.    . nitroGLYCERIN (NITROLINGUAL) 0.4 MG/SPRAY spray Place 1 spray under the tongue every 5 (five) minutes x 3 doses as needed for chest pain (3 sprays max).  . Omega-3 Fatty Acids (FISH OIL) 1000 MG CAPS Take 1 capsule by mouth daily.   [name] Kitchen PROAIR HFA 108 (90 Base) MCG/ACT inhaler Inhale 2 puffs into the lungs every 4 (four) hours as needed for shortness of breath.   . RESTASIS 0.05 % ophthalmic emulsion Place 2 drops into both eyes 2 (two) times daily. For dry eyes  . triamcinolone cream (KENALOG) 0.1 % Apply 1 application topically 2 (two) times daily.  . Vitamin D, Ergocalciferol, (DRISDOL) 50000 units CAPS capsule Take 1 capsule (50,000 Units total) by mouth every 7 (seven) days.    Allergies:  Allergies  Allergen Reactions  . Fosamax [Alendronate Sodium] Other (See Comments)    "made me ache all over"     Review of Systems: General:   Denies fever, chills, unexplained weight loss.  Optho/Auditory:   Denies visual changes, blurred vision/LOV Respiratory:   Denies wheeze, DOE more than baseline levels.  Cardiovascular:   Denies chest pain, palpitations, new onset peripheral edema  Gastrointestinal:   Denies nausea,  vomiting, diarrhea, abd pain.  Genitourinary: Denies dysuria, freq/ urgency, flank pain or discharge from genitals.  Endocrine:     Denies hot or cold intolerance, polyuria, polydipsia. Musculoskeletal:   Denies unexplained myalgias, joint swelling, unexplained arthralgias, gait problems.  Skin:  Denies new onset rash, suspicious lesions Neurological:     Denies dizziness, unexplained weakness, numbness  Psychiatric/Behavioral:   Denies mood changes, suicidal or homicidal ideations, hallucinations    Objective:   Blood pressure 111/74, pulse 76, height 5\' 4"  (1.626 m), weight 124 lb 11.2 oz (56.6 kg), SpO2 97 %. Body mass index is 21.4 kg/m. General:  Well Developed, well nourished, appropriate for stated age.  Neuro:  Alert and oriented,  extra-ocular muscles intact  HEENT:  Normocephalic, atraumatic, neck supple Skin:  no gross rash, warm, pink. Cardiac:  RRR, S1 S2 Respiratory:  ECTA B/L and A/P, Not using accessory muscles, speaking in full sentences- unlabored. Vascular:  Ext warm, no cyanosis apprec.; cap RF less 2 sec. Psych:  No HI/SI, judgement and insight good, Euthymic mood. Full Affect. Rib: significant bony tenderness along lateral aspect of lower ribs on the L from anterior to posteriorly. Most prominent posteriorly.

## 2018-04-05 NOTE — Patient Instructions (Addendum)
1) appears you have extensive arthritis in the lower rib joints on the left as well as likely throughout her body but I recommend further imaging with a CT scan to make sure that you do not have pieces of ribs that would lead to flail chest etc.  Since you declined this today please let me know if your pain worsens or if you change your mind.  If you become acutely short of breath please dial 911 because this could mean a piece of your rib has punctured your lungs.  Otherwise please use Tylenol for the pain and arthritis as well as use the pillow for splinting and helping you breathing helping with pain.  This will take 6-8 weeks and up to 12 weeks to completely heal   2) A1c went from 6.0-6.1 now versus September 2018 please work on a low sugar low carbohydrate diet and try to move more.  Please see below on a prudent diet    Risk factors for prediabetes and type 2 diabetes  Researchers don't fully understand why some people develop prediabetes and type 2 diabetes and others don't.  It's clear that certain factors increase the risk, however, including:  Weight. The more fatty tissue you have, the more resistant your cells become to insulin.  Inactivity. The less active you are, the greater your risk. Physical activity helps you control your weight, uses up glucose as energy and makes your cells more sensitive to insulin.  Family history. Your risk increases if a parent or sibling has type 2 diabetes.  Race. Although it's unclear why, people of certain races - including blacks, Hispanics, American Indians and Asian-Americans - are at higher risk.  Age. Your risk increases as you get older. This may be because you tend to exercise less, lose muscle mass and gain weight as you age. But type 2 diabetes is also increasing dramatically among children, adolescents and younger adults.  Gestational diabetes. If you developed gestational diabetes when you were pregnant, your risk of developing prediabetes  and type 2 diabetes later increases. If you gave birth to a baby weighing more than 9 pounds (4 kilograms), you're also at risk of type 2 diabetes.  Polycystic ovary syndrome. For women, having polycystic ovary syndrome - a common condition characterized by irregular menstrual periods, excess hair growth and obesity - increases the risk of diabetes.  High blood pressure. Having blood pressure over 140/90 millimeters of mercury (mm Hg) is linked to an increased risk of type 2 diabetes.  Abnormal cholesterol and triglyceride levels. If you have low levels of high-density lipoprotein (HDL), or "good," cholesterol, your risk of type 2 diabetes is higher. Triglycerides are another type of fat carried in the blood. People with high levels of triglycerides have an increased risk of type 2 diabetes. Your doctor can let you know what your cholesterol and triglyceride levels are.  A good guide to good carbs: The glycemic index ---If you have diabetes, or at risk for diabetes, you know all too well that when you eat carbohydrates, your blood sugar goes up. The total amount of carbs you consume at a meal or in a snack mostly determines what your blood sugar will do. But the food itself also plays a role. A serving of white rice has almost the same effect as eating pure table sugar - a quick, high spike in blood sugar. A serving of lentils has a slower, smaller effect.  ---Picking good sources of carbs can help you control your blood  sugar and your weight. Even if you don't have diabetes, eating healthier carbohydrate-rich foods can help ward off a host of chronic conditions, from heart disease to various cancers to, well, diabetes.  ---One way to choose foods is with the glycemic index (GI). This tool measures how much a food boosts blood sugar.  The glycemic index rates the effect of a specific amount of a food on blood sugar compared with the same amount of pure glucose. A food with a glycemic index of 28 boosts  blood sugar only 28% as much as pure glucose. One with a GI of 95 acts like pure glucose.    High glycemic foods result in a quick spike in insulin and blood sugar (also known as blood glucose).  Low glycemic foods have a slower, smaller effect- these are healthier for you.   Using the glycemic index Using the glycemic index is easy: choose foods in the low GI category instead of those in the high GI category (see below), and go easy on those in between. Low glycemic index (GI of 55 or less): Most fruits and vegetables, beans, minimally processed grains, pasta, low-fat dairy foods, and nuts.  Moderate glycemic index (GI 56 to 69): White and sweet potatoes, corn, white rice, couscous, breakfast cereals such as Cream of Wheat and Mini Wheats.  High glycemic index (GI of 70 or higher): White bread, rice cakes, most crackers, bagels, cakes, doughnuts, croissants, most packaged breakfast cereals. You can see the values for 100 commons foods and get links to more at www.health.CheapToothpicks.si.  Swaps for lowering glycemic index  Instead of this high-glycemic index food Eat this lower-glycemic index food  White rice Brown rice or converted rice  Instant oatmeal Steel-cut oats  Cornflakes Bran flakes  Baked potato Pasta, bulgur  White bread Whole-grain bread  Corn Peas or leafy greens       Prediabetes Eating Plan  Prediabetes--also called impaired glucose tolerance or impaired fasting glucose--is a condition that causes blood sugar (blood glucose) levels to be higher than normal. Following a healthy diet can help to keep prediabetes under control. It can also help to lower the risk of type 2 diabetes and heart disease, which are increased in people who have prediabetes. Along with regular exercise, a healthy diet:  Promotes weight loss.  Helps to control blood sugar levels.  Helps to improve the way that the body uses insulin.   WHAT DO I NEED TO KNOW ABOUT THIS EATING  PLAN?   Use the glycemic index (GI) to plan your meals. The index tells you how quickly a food will raise your blood sugar. Choose low-GI foods. These foods take a longer time to raise blood sugar.  Pay close attention to the amount of carbohydrates in the food that you eat. Carbohydrates increase blood sugar levels.  Keep track of how many calories you take in. Eating the right amount of calories will help you to achieve a healthy weight. Losing about 7 percent of your starting weight can help to prevent type 2 diabetes.  You may want to follow a Mediterranean diet. This diet includes a lot of vegetables, lean meats or fish, whole grains, fruits, and healthy oils and fats.   WHAT FOODS CAN I EAT?  Grains Whole grains, such as whole-wheat or whole-grain breads, crackers, cereals, and pasta. Unsweetened oatmeal. Bulgur. Barley. Quinoa. Brown rice. Corn or whole-wheat flour tortillas or taco shells. Vegetables Lettuce. Spinach. Peas. Beets. Cauliflower. Cabbage. Broccoli. Carrots. Tomatoes. Squash. Eggplant. Herbs. Peppers.  Onions. Cucumbers. Brussels sprouts. Fruits Berries. Bananas. Apples. Oranges. Grapes. Papaya. Mango. Pomegranate. Kiwi. Grapefruit. Cherries. Meats and Other Protein Sources Seafood. Lean meats, such as chicken and [place] or lean cuts of pork and beef. Tofu. Eggs. Nuts. Beans. Dairy Low-fat or fat-free dairy products, such as yogurt, cottage cheese, and cheese. Beverages Water. Tea. Coffee. Sugar-free or diet soda. Seltzer water. Milk. Milk alternatives, such as soy or almond milk. Condiments Mustard. Relish. Low-fat, low-sugar ketchup. Low-fat, low-sugar barbecue sauce. Low-fat or fat-free mayonnaise. Sweets and Desserts Sugar-free or low-fat pudding. Sugar-free or low-fat ice cream and other frozen treats. Fats and Oils Avocado. Walnuts. Olive oil. The items listed above may not be a complete list of recommended foods or beverages. Contact your dietitian for more  options.    WHAT FOODS ARE NOT RECOMMENDED?  Grains Refined white flour and flour products, such as bread, pasta, snack foods, and cereals. Beverages Sweetened drinks, such as sweet iced tea and soda. Sweets and Desserts Baked goods, such as cake, cupcakes, pastries, cookies, and cheesecake. The items listed above may not be a complete list of foods and beverages to avoid. Contact your dietitian for more information.   This information is not intended to replace advice given to you by your health care provider. Make sure you discuss any questions you have with your health care provider.   Document Released: 04/23/2015 Document Reviewed: 04/23/2015 Elsevier Interactive Patient Education [employer].

## 2018-04-08 ENCOUNTER — Other Ambulatory Visit: Payer: Self-pay | Admitting: Family Medicine

## 2018-04-08 DIAGNOSIS — Z9889 Other specified postprocedural states: Secondary | ICD-10-CM

## 2018-04-08 DIAGNOSIS — S2249XA Multiple fractures of ribs, unspecified side, initial encounter for closed fracture: Secondary | ICD-10-CM

## 2018-04-08 DIAGNOSIS — E2839 Other primary ovarian failure: Secondary | ICD-10-CM

## 2018-04-08 DIAGNOSIS — E559 Vitamin D deficiency, unspecified: Secondary | ICD-10-CM

## 2018-04-13 NOTE — Progress Notes (Signed)
HEMATOLOGY/ONCOLOGY CLINIC NOTE  Date of Service: 04/13/18  Patient Care Team: [name], DO as PCP - General (Family Medicine) Josue Hector, MD as Consulting Physician (Cardiology) Penni Bombard, MD as Consulting Physician (Neurology) Jarome Matin, MD as Consulting Physician (Dermatology) Rozetta Nunnery, MD as Consulting Physician (Otolaryngology) Brunetta Genera, MD as Consulting Physician (Hematology) Melina Schools, MD as Consulting Physician (Orthopedic Surgery) Josue Hector, MD as Consulting Physician (Cardiology)  CHIEF COMPLAINTS/PURPOSE OF CONSULTATION:   f/u for continued treatment of Hodgkin's lymphoma  HISTORY OF PRESENTING ILLNESS:   plz see previous   INTERVAL HISTORY   Kim Lane is here for scheduled follow up for her Hodgkins lymphoma. Since her last visit her PCP for a closed fracture of multiple ribs on 04/05/18.  Today she presents to the clinic noting she broke her left ribs and hurt her back from bending over. She notes she has been feeling weaker lately. She has a bone density scan on 05/12/18. She is currently 5'1" and she use to be 5'4". She notes her teeth were previously decayed and had to get her teeth bridged. She does not plan for further dental procedures in the near future. She was seeing orthopedists, Dr. Rolena Infante, who did her back surgery last year.   On review of symptoms, pt notes rib and back pain. She denies fever or chill. She notes she is cold often. She note she has gained some weight. She has good appetite. She notes her toe nails b/l have change.      MEDICAL HISTORY:  Past Medical History:  Diagnosis Date  . Anemia   . Anginal pain ([place])    occ  . Blood dyscrasia 12/2016   hodgins lymphoma tx-remission  . CAD (coronary artery disease) 02/2006   Taxus stent placed to LAD and Diagonal per Dr. Olevia Perches  . Cataracts, bilateral   . Complication of anesthesia   . HTN (hypertension)   .  Hypercholesterolemia   . Hypothyroidism   . IBS (irritable bowel syndrome)   . Osteoarthritis   . PONV (postoperative nausea and vomiting)   . Stroke [facility]) 03/2016  Osteoporosis Cardiologist Dr. [name] -Dr. Earlean Shawl  SURGICAL HISTORY: Past Surgical History:  Procedure Laterality Date  . APPENDECTOMY    . IR GENERIC HISTORICAL  01/25/2017   IR US GUIDE VASC ACCESS RIGHT 01/25/2017 [name], MD [facility]  . IR GENERIC HISTORICAL  01/25/2017   IR FLUORO GUIDE PORT INSERTION RIGHT 01/25/2017 [name], MD [facility]  . IR REMOVAL TUN ACCESS W/ PORT W/O FL MOD SED  10/28/2017  . KYPHOPLASTY N/A 06/24/2017   Procedure: KYPHOPLASTY T10;  Surgeon: Melina Schools, MD;  Location: [place];  Service: Orthopedics;  Laterality: N/A;  90 mins  . LEFT HEART CATH AND CORONARY ANGIOGRAPHY N/A 06/16/2017   Procedure: Left Heart Cath and Coronary Angiography;  Surgeon: Sherren Mocha, MD;  Location: [place] CV LAB;  Service: Cardiovascular;  Laterality: N/A;  . MASS EXCISION Right 12/22/2016   Procedure: RIGHT NECK LYMPH NODE BIOPSY;  Surgeon: Rozetta Nunnery, MD;  Location: [place];  Service: ENT;  Laterality: Right;  . SPINE SURGERY    . stents     in heart  . TONSILLECTOMY    . TOTAL KNEE ARTHROPLASTY Bilateral 2011,2012  . VAGINAL HYSTERECTOMY      SOCIAL HISTORY: Social History   Socioeconomic History  . Marital status: Widowed    Spouse name: Not  on file  . Number of children: 2  . Years of education: 73  . 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 Financial resource strain: Not on file  . Food insecurity:    Worry: Not on file    Inability: Not on file  . Transportation needs:    Medical: Not on file    Non-medical: Not on file  Tobacco Use  . Smoking status: Former Research scientist (life sciences)  . Smokeless tobacco: Never Used  . Tobacco comment: quit smoking 30 years  ago, very light smoker  Substance and Sexual Activity  . Alcohol use: No    Alcohol/week: 0.0 oz  . Drug use: No  . Sexual activity: Not on file  Lifestyle  . Physical activity:    Days per week: Not on file    Minutes per session: Not on file  . Stress: Not on file  Relationships  . Social connections:    Talks on phone: Not on file    Gets together: Not on file    Attends religious service: Not on file    Active member of club or organization: Not on file    Attends meetings of clubs or organizations: Not on file    Relationship status: Not on file  . Intimate partner violence:    Fear of current or ex partner: Not on file    Emotionally abused: Not on file    Physically abused: Not on file    Forced sexual activity: Not on file  Other Topics Concern  . Not on file  Social History Narrative   Lives alone   caffeine use- coffee- 5 cups daily    FAMILY HISTORY: Family History  Problem Relation Age of Onset  . Diabetes Mother   . Dementia Mother   . ALS Mother   . Heart Problems Father   . Liver disease Sister   . Cirrhosis Sister   . Heart block Unknown        CABG  . Diabetes Sister   . Heart block Son        CABG  . Other Twilight  . Other Daughter        Martin  . Dementia Sister     ALLERGIES:  is allergic to fosamax [alendronate sodium].  MEDICATIONS:  Current Outpatient Medications  Medication Sig Dispense Refill  . aspirin 81 MG tablet Take 81 mg by mouth daily.      . carvedilol (COREG) 3.125 MG tablet TAKE 1 TABLET BY MOUTH EVERY OTHER MORNING 45 tablet 0  . Coenzyme Q10 100 MG capsule Take 100 mg by mouth daily.     [name] Kitchen levothyroxine (SYNTHROID, LEVOTHROID) 88 MCG tablet Take 88 mcg by mouth daily before breakfast.     . Melatonin 2.5 MG CAPS Take 1 capsule by mouth at bedtime.    . Multiple Vitamins-Minerals (CENTRUM SILVER PO) Take 1 tablet by mouth daily.      . nitroGLYCERIN (NITROLINGUAL) 0.4 MG/SPRAY spray Place 1  spray under the tongue every 5 (five) minutes x 3 doses as needed for chest pain (3 sprays max).    . Omega-3 Fatty Acids (FISH OIL) 1000 MG CAPS Take 1 capsule by mouth daily.     [name] Kitchen PROAIR HFA 108 (90 Base) MCG/ACT inhaler Inhale 2 puffs into the lungs every 4 (four) hours  as needed for shortness of breath.     . RESTASIS 0.05 % ophthalmic emulsion Place 2 drops into both eyes 2 (two) times daily. For dry eyes    . triamcinolone cream (KENALOG) 0.1 % Apply 1 application topically 2 (two) times daily. 60 g 0  . Vitamin D, Ergocalciferol, (DRISDOL) 50000 units CAPS capsule Take 1 capsule (50,000 Units total) by mouth 2 (two) times a week. 24 capsule 2   No current facility-administered medications for this visit.     REVIEW OF SYSTEMS:  .10 Point review of Systems was done is negative except as noted above.  PHYSICAL EXAMINATION:  ECOG PERFORMANCE STATUS: 2 - Symptomatic, <50% confined to bed  Vitals:   04/19/18 1237  BP: 133/65  Pulse: 64  Resp: 18  Temp: 98.2 F (36.8 C)  TempSrc: Oral  SpO2: 99%  Weight: 125 lb 11.2 oz (57 kg)  Height: 5\' 4"  (1.626 m)  . GENERAL:elderly female , alert, in no acute distress and comfortable SKIN: no acute rashes, no significant lesions EYES: conjunctiva are pink and non-injected, sclera anicteric OROPHARYNX: MMM, no exudates, no oropharyngeal erythema or ulceration NECK: supple, no JVD LYMPH:  no palpable lymphadenopathy in the cervical, axillary or inguinal regions LUNGS: clear to auscultation b/l with normal respiratory effort HEART: regular rate & rhythm, chest wall tenderness to palpation. ABDOMEN:  normoactive bowel sounds , non tender, not distended. Extremity: no pedal edema PSYCH: alert & oriented x 3 with fluent speech NEURO: no focal motor/sensory deficits    LABORATORY DATA:  I have reviewed the data as listed   . CBC Latest Ref Rng & Units 04/19/2018 01/06/2018 10/28/2017  WBC 3.9 - 10.3 K/uL 7.3 6.8 5.3  Hemoglobin 11.6 -  15.9 g/dL 12.7 12.5 11.5(L)  Hematocrit 34.8 - 46.6 % 38.8 38.6 35.4(L)  Platelets 145 - 400 K/uL 342 342 355   . CMP Latest Ref Rng & Units 04/19/2018 01/06/2018 10/07/2017  Glucose 70 - 140 mg/dL 115 87 103  BUN 7 - 26 mg/dL 19 19 18.5  Creatinine 0.60 - 1.10 mg/dL 0.98 0.89 0.9  Sodium 136 - 145 mmol/L 139 141 139  Potassium 3.5 - 5.1 mmol/L 4.7 4.4 4.3  Chloride 98 - 109 mmol/L 103 106 -  CO2 22 - 29 mmol/L 29 28 25   Calcium 8.4 - 10.4 mg/dL 9.7 9.5 9.2  Total Protein 6.4 - 8.3 g/dL 7.3 7.1 6.7  Total Bilirubin 0.2 - 1.2 mg/dL 0.5 0.4 0.72  Alkaline Phos 40 - 150 U/L 82 72 63  AST 5 - 34 U/L 24 19 24   ALT 0 - 55 U/L 16 16 17    Erythrocyte Sedimentation Rate     Component Value Date/Time   ESRSEDRATE 13 04/19/2018 1117   ESRSEDRATE 20 10/07/2017 1115    RADIOGRAPHIC STUDIES: I have personally reviewed the radiological images as listed and agreed with the findings in the report. Dg Ribs Unilateral Left  Result Date: 04/05/2018 CLINICAL DATA:  Left rib pain EXAM: LEFT RIBS - 2 VIEW COMPARISON:  Chest x-ray 05/22/2017 and 11/25/2016 FINDINGS: No active infiltrate or effusion is seen. Mild linear scarring is present at the left lung base. Mediastinal and hilar contours are unremarkable. The heart is within upper limits of normal. The descending thoracic aorta remains ectatic. Left rib detail films show no acute left rib fracture. Vertebroplasty is noted at T10. IMPRESSION: 1. Negative left rib detail. 2. No active lung disease. 3. Vertebral augmentation at T10. Electronically Signed   By:  Ivar Drape M.D.   On: 04/05/2018 09:43    ASSESSMENT & PLAN:   84 y.o. Caucasian female with ECOG performance status of 2 with  1) Classical Hodgkin's lymphoma of nodular sclerosis variety At least Stage IIIB some concern for possible Rt lung involvement which makes it Stage IVB Presented with right neck lymph nodes . She had type B constitutional symptoms with 15-20 pound weight loss night  sweats or chills .noted to have significant pruritus with mild rash likely from Hodgkin's which remain resolved  Wt Readings from Last 3 Encounters:  04/05/18 124 lb 11.2 oz (56.6 kg)  03/21/18 123 lb 9.6 oz (56.1 kg)  01/26/18 124 lb (56.2 kg)  PET/CT scan after 5 cycles shows marked response to chemotherapy and stable predominantly [place] 2 as per discussion with the radiology.  2) Microcytic anemia - likely anemia of chronic disease due to Hodgkin's lymphoma. anemia resolved, Hg stable   PLAN  -Labs stable and overall WNL. No residual prohibitive toxicities.  -patient has no clinical or lab evidence of Hodgkins lymphoma progression/recurrence at this time. -B/l toe nail change, likely from prior chemo. Will monitor.   3) T10 compression fracture based on imaging done on 05/22/2017 in ED Now status post kyphoplasty with significant improvement in her back pain 6) Osteoporosis PET/CT scan also shows possible L4 compression fracture . These compression fractures appear to be related to osteoporosis . Also had rib fracture related to no significant trauma She was previously on Fosamax that cause generalized body aches . Plan  -She had a recent close rib fracture from bending over in 03/2018  -She plans to have DEXA scan on 05/12/18 at [place] -Given she is prone to fractures with small movement, we may plan to start her on Prolia injection. I provided her with reading material on these medications.  -I explained she should notify us if she is thinking of doing invasive dental procedures while on Prolia.  -She is to continue ergocalciferol 50k twice weekly to target a 25OH vit D level of close to 50-60  7) recently noted triple-vessel coronary artery disease based on cardiac cath: patient is surprisingly asymptomatic. -Continue management as per PCP and cardiology.  8) . Patient Active Problem List   Diagnosis Date Noted  . S/P kyphoplasty- dr Rolena Infante- ortho 04/05/2018  .  Postmenopausal 04/05/2018  . Closed fracture of multiple ribs 04/05/2018  . Allergic contact dermatitis due to cosmetics 01/26/2018  . Trigger finger, acquired- R 3rd digit 01/26/2018  . Osteoarthritis of finger 01/26/2018  . Vitamin D deficiency 11/24/2017  . Glucose intolerance (impaired glucose tolerance) 11/24/2017  . Osteoarthritis 09/16/2017  . History of iron deficiency anemia-  by ONC 07/23/2017  . Osteoporosis 07/23/2017  . CAD S/P percutaneous coronary angioplasty 07/02/2017  . Closed compression fracture of L2 lumbar vertebra 06/24/2017  . Abnormal nuclear cardiac imaging test 06/16/2017  . Malnutrition of moderate degree 04/27/2017  . Symptomatic anemia 04/26/2017  . Hodgkin lymphoma ([place]) 04/26/2017  . Port catheter in place 02/02/2017  . Nodular sclerosis Hodgkin lymphoma of lymph nodes of neck ([place]) 01/13/2017  . Dyspnea 09/14/2012  . Epistaxis 06/02/2011  . HYPERCHOLESTEROLEMIA, MIXED 01/08/2009  . Essential hypertension, benign 01/08/2009  . Coronary atherosclerosis- sees Dr. Johnsie Cancel 01/08/2009  . Hypothyroidism 01/08/2009  PLAN  -Continue follow-up with primary care physician for management of other chronic medical co-morbidities.   DEXA scan on 05/12/18 at [place] starting in 1 months RTC with Dr [name] with labs  in 1 month   All of the patients questions were answered with apparent satisfaction. The patient knows to call the clinic with any problems, questions or concerns.  . The total time spent in the appointment was 25 minutes and more than 50% was on counseling and direct patient cares.     Sullivan Lone MD [place] AAHIVMS [facility] Hematology/Oncology Physician [place]  (Office):       [phone] (Work cell):  [phone] (Fax):           [phone]  This document serves as a record of services personally performed by Sullivan Lone, MD. It was created on his behalf by Joslyn Devon, a trained medical scribe. The  creation of this record is based on the scribe's personal observations and the provider's statements to them.    .I have reviewed the above documentation for accuracy and completeness, and I agree with the above.   Brunetta Genera MD MS

## 2018-04-19 ENCOUNTER — Telehealth: Payer: Self-pay | Admitting: Family Medicine

## 2018-04-19 ENCOUNTER — Inpatient Hospital Stay: Payer: Medicare Other

## 2018-04-19 ENCOUNTER — Telehealth: Payer: Self-pay | Admitting: Hematology

## 2018-04-19 ENCOUNTER — Inpatient Hospital Stay: Payer: Medicare Other | Attending: Hematology | Admitting: Hematology

## 2018-04-19 VITALS — BP 133/65 | HR 64 | Temp 98.2°F | Resp 18 | Ht 64.0 in | Wt 125.7 lb

## 2018-04-19 DIAGNOSIS — C8118 Nodular sclerosis classical Hodgkin lymphoma, lymph nodes of multiple sites: Secondary | ICD-10-CM | POA: Diagnosis not present

## 2018-04-19 DIAGNOSIS — Z87891 Personal history of nicotine dependence: Secondary | ICD-10-CM | POA: Insufficient documentation

## 2018-04-19 DIAGNOSIS — E559 Vitamin D deficiency, unspecified: Secondary | ICD-10-CM | POA: Insufficient documentation

## 2018-04-19 DIAGNOSIS — Z7982 Long term (current) use of aspirin: Secondary | ICD-10-CM | POA: Insufficient documentation

## 2018-04-19 DIAGNOSIS — C8190 Hodgkin lymphoma, unspecified, unspecified site: Secondary | ICD-10-CM

## 2018-04-19 DIAGNOSIS — I10 Essential (primary) hypertension: Secondary | ICD-10-CM | POA: Diagnosis not present

## 2018-04-19 DIAGNOSIS — M8000XG Age-related osteoporosis with current pathological fracture, unspecified site, subsequent encounter for fracture with delayed healing: Secondary | ICD-10-CM

## 2018-04-19 DIAGNOSIS — I2510 Atherosclerotic heart disease of native coronary artery without angina pectoris: Secondary | ICD-10-CM | POA: Diagnosis not present

## 2018-04-19 DIAGNOSIS — Z8673 Personal history of transient ischemic attack (TIA), and cerebral infarction without residual deficits: Secondary | ICD-10-CM | POA: Insufficient documentation

## 2018-04-19 DIAGNOSIS — D509 Iron deficiency anemia, unspecified: Secondary | ICD-10-CM | POA: Insufficient documentation

## 2018-04-19 DIAGNOSIS — E039 Hypothyroidism, unspecified: Secondary | ICD-10-CM | POA: Diagnosis not present

## 2018-04-19 DIAGNOSIS — M8080XS Other osteoporosis with current pathological fracture, unspecified site, sequela: Secondary | ICD-10-CM

## 2018-04-19 LAB — CMP (CANCER CENTER ONLY)
ALT: 16 U/L (ref 0–55)
AST: 24 U/L (ref 5–34)
Albumin: 4.3 g/dL (ref 3.5–5.0)
Alkaline Phosphatase: 82 U/L (ref 40–150)
Anion gap: 7 (ref 3–11)
BUN: 19 mg/dL (ref 7–26)
CO2: 29 mmol/L (ref 22–29)
Calcium: 9.7 mg/dL (ref 8.4–10.4)
Chloride: 103 mmol/L (ref 98–109)
Creatinine: 0.98 mg/dL (ref 0.60–1.10)
GFR, Est AFR Am: >60 mL/min (ref >60)
GFR, Estimated: 53 mL/min — ABNORMAL LOW (ref >60)
Glucose, Bld: 115 mg/dL (ref 70–140)
Potassium: 4.7 mmol/L (ref 3.5–5.1)
Sodium: 139 mmol/L (ref 136–145)
Total Bilirubin: 0.5 mg/dL (ref 0.2–1.2)
Total Protein: 7.3 g/dL (ref 6.4–8.3)

## 2018-04-19 LAB — RETICULOCYTES
RBC.: 4.27 MIL/uL (ref 3.70–5.45)
Retic Count, Absolute: 47 10*3/uL (ref 33.7–90.7)
Retic Ct Pct: 1.1 % (ref 0.7–2.1)

## 2018-04-19 LAB — CBC WITH DIFFERENTIAL (CANCER CENTER ONLY)
Basophils Absolute: 0.1 10*3/uL (ref 0.0–0.1)
Basophils Relative: 1 %
Eosinophils Absolute: 0.6 10*3/uL — ABNORMAL HIGH (ref 0.0–0.5)
Eosinophils Relative: 9 %
HCT: 38.8 % (ref 34.8–46.6)
Hemoglobin: 12.7 g/dL (ref 11.6–15.9)
Lymphocytes Relative: 18 %
Lymphs Abs: 1.3 10*3/uL (ref 0.9–3.3)
MCH: 29.7 pg (ref 25.1–34.0)
MCHC: 32.7 g/dL (ref 31.5–36.0)
MCV: 90.9 fL (ref 79.5–101.0)
Monocytes Absolute: 0.6 10*3/uL (ref 0.1–0.9)
Monocytes Relative: 8 %
Neutro Abs: 4.7 10*3/uL (ref 1.5–6.5)
Neutrophils Relative %: 64 %
Platelet Count: 342 10*3/uL (ref 145–400)
RBC: 4.27 MIL/uL (ref 3.70–5.45)
RDW: 14.2 % (ref 11.2–14.5)
WBC Count: 7.3 10*3/uL (ref 3.9–10.3)

## 2018-04-19 LAB — SEDIMENTATION RATE: Sed Rate: 13 mm/hr (ref 0–22)

## 2018-04-19 MED ORDER — VITAMIN D (ERGOCALCIFEROL) 1.25 MG (50000 UNIT) PO CAPS: 50000.0000 [IU] | ORAL_CAPSULE | ORAL | 2 refills | Status: DC

## 2018-04-19 NOTE — Telephone Encounter (Signed)
Medication was filled by Dr. [name] and I notified pharmacy that our office did not sent in this RX and they need to contact Dr. Grier Mitts office. MPulliam, CMA/RT(R)

## 2018-04-19 NOTE — Telephone Encounter (Signed)
Kim Lane at [facility] has some question about dosage of Vit D. She can be reached at [phone]

## 2018-04-19 NOTE — Telephone Encounter (Signed)
Appointments scheduled AVS/Calendar printed per 4/30 los °

## 2018-04-22 ENCOUNTER — Other Ambulatory Visit: Payer: Self-pay | Admitting: Family Medicine

## 2018-04-22 DIAGNOSIS — I10 Essential (primary) hypertension: Secondary | ICD-10-CM

## 2018-05-02 ENCOUNTER — Telehealth: Payer: Self-pay | Admitting: Family Medicine

## 2018-05-02 NOTE — Telephone Encounter (Signed)
Patient wishes provider or nurse to call her.  --- patient is concerned that [facility] provider is giving her an injection of something @ his OV on 5/28.  ------ patient has Bone density scan on 5/23 and is concerned wants  PCP to advise her on if she needs the injection.  --Forwarding message to provider's medical assistant for review & patient contact @ [phone]- [phone]  --glh

## 2018-05-03 NOTE — Telephone Encounter (Signed)
Called patient left message for patient to call the office. MPulliam, CMA/RT(R)  

## 2018-05-03 NOTE — Telephone Encounter (Signed)
Spoke to the patient she states that she had visit with Dr. [name] with oncology on 04/19/2018 and he recommends Prolia injections for her.  Patient has DEXA scan set up for 05/12/2018 and first shot is set up for 05/17/2018.  Patient is requesting your thoughts on the injection and if she should wait until results from the bone density study is done and ready.  Please advise. MPulliam, CMA/RT(R)

## 2018-05-05 NOTE — Telephone Encounter (Signed)
If her oncologist recommends she be on it, I recommend she do what her oncologist says.

## 2018-05-05 NOTE — Telephone Encounter (Signed)
Patient notified. MPulliam, CMA/RT(R)  

## 2018-05-12 ENCOUNTER — Ambulatory Visit
Admission: RE | Admit: 2018-05-12 | Discharge: 2018-05-12 | Disposition: A | Discharge status: Home / Self Care | Payer: Medicare Other | Source: Ambulatory Visit | Attending: Family Medicine | Admitting: Family Medicine

## 2018-05-12 DIAGNOSIS — Z9889 Other specified postprocedural states: Secondary | ICD-10-CM

## 2018-05-12 DIAGNOSIS — E559 Vitamin D deficiency, unspecified: Secondary | ICD-10-CM

## 2018-05-12 DIAGNOSIS — S2249XA Multiple fractures of ribs, unspecified side, initial encounter for closed fracture: Secondary | ICD-10-CM

## 2018-05-12 DIAGNOSIS — E2839 Other primary ovarian failure: Secondary | ICD-10-CM

## 2018-05-17 ENCOUNTER — Other Ambulatory Visit: Payer: Medicare Other

## 2018-05-17 ENCOUNTER — Ambulatory Visit: Payer: Medicare Other

## 2018-05-17 ENCOUNTER — Ambulatory Visit: Payer: Medicare Other | Admitting: Hematology

## 2018-05-25 ENCOUNTER — Other Ambulatory Visit: Payer: Self-pay

## 2018-05-25 ENCOUNTER — Ambulatory Visit: Payer: Medicare Other | Admitting: Family Medicine

## 2018-05-25 MED ORDER — LEVOTHYROXINE SODIUM 88 MCG PO TABS: 88.0000 ug | ORAL_TABLET | Freq: Every day | ORAL | 0 refills | Status: DC

## 2018-05-25 NOTE — Telephone Encounter (Signed)
Pharmacy sent refill request for Levothyroxine.  Medication last filled by a pervious provider. LOV 04/05/18.   Please review and advise. MPulliam, CMA/RT(R)

## 2018-05-30 ENCOUNTER — Ambulatory Visit: Payer: Medicare Other | Admitting: Family Medicine

## 2018-05-30 ENCOUNTER — Encounter: Payer: Self-pay | Admitting: Family Medicine

## 2018-05-30 VITALS — BP 116/74 | HR 71 | Ht 64.0 in | Wt 123.9 lb

## 2018-05-30 DIAGNOSIS — M810 Age-related osteoporosis without current pathological fracture: Secondary | ICD-10-CM

## 2018-05-30 DIAGNOSIS — R7302 Impaired glucose tolerance (oral): Secondary | ICD-10-CM

## 2018-05-30 DIAGNOSIS — E559 Vitamin D deficiency, unspecified: Secondary | ICD-10-CM | POA: Diagnosis not present

## 2018-05-30 DIAGNOSIS — M6530 Trigger finger, unspecified finger: Secondary | ICD-10-CM

## 2018-05-30 DIAGNOSIS — I10 Essential (primary) hypertension: Secondary | ICD-10-CM

## 2018-05-30 DIAGNOSIS — L232 Allergic contact dermatitis due to cosmetics: Secondary | ICD-10-CM

## 2018-05-30 DIAGNOSIS — M1990 Unspecified osteoarthritis, unspecified site: Secondary | ICD-10-CM

## 2018-05-30 NOTE — Progress Notes (Signed)
Impression and Recommendations:    1. Essential hypertension, benign   2. Glucose intolerance (impaired glucose tolerance)   3. Osteoarthritis, unspecified osteoarthritis type, unspecified site   4. Osteoporosis, unspecified osteoporosis type, unspecified pathological fracture presence   5. Vitamin D deficiency   6. Trigger finger, acquired- R 3rd digit   7. Allergic contact dermatitis due to cosmetics     1. HTN -BP well controlled at this time. Continue meds as listed below.  -Check your BP at home and keep a log. Bring this into next OV.   2. Glucose intolerance -BS well controlled at this time. A1c was 6.1 on 04-05-18. Will recheck A1c in 2 months.  -continue prudent diet/exercise and limiting sweets/carbs.  3. Osteoporosis -Pt had bone density DEXA scan 05-12-18 with T score of -2.8. She has not tolerated fosamax in the past.   -continue vit D supplementation. -will start Prolia per oncologist.   4. Vit D deficiency -Continue vitamin D supplement. She is taking 2 50,000 IUs weekly, prescribed by hematologist.   5. Trigger finger,  -avoid aggravating activities of using hand clippers etc.  6. OA- knee -Apply ice to the area, 15-20 minutes a day, 4-5 times per day.  -encouraged regular weight-bearing exercises and walking. Consider joining the [facility] or a walking group. -walk more. Start with 5 minutes a day, increasingly this slowly over time to 30 minutes a day.   7. Allergic contact dermatitis -continue using triamcinolone.  -Drink adequate amounts of water, equal to half of your wt in oz per day.   No orders of the defined types were placed in this encounter.   No orders of the defined types were placed in this encounter.   Gross side effects, risk and benefits, and alternatives of medications and treatment plan in general discussed with patient.  Patient is aware that all medications have potential side effects and we are unable to predict every side  effect or drug-drug interaction that may occur.   Patient will call with any questions prior to using medication if they have concerns.  Expresses verbal understanding and consents to current therapy and treatment regimen.  No barriers to understanding were identified.  Red flag symptoms and signs discussed in detail.  Patient expressed understanding regarding what to do in case of emergency\urgent symptoms  Please see AVS handed out to patient at the end of our visit for further patient instructions/ counseling done pertaining to today's office visit.   Return for 716 or so for A1c recheck then follow-up with me 3-4 months for FBW and OV.    Note: This note was prepared with assistance of Dragon voice recognition software. Occasional wrong-word or sound-a-like substitutions may have occurred due to the inherent limitations of voice recognition software.   This document serves as a record of services personally performed by [name], DO. It was created on her behalf by Mayer Masker, a trained medical scribe. The creation of this record is based on the scribe's personal observations and the provider's statements to them.   I have reviewed the above medical documentation for accuracy and completeness and I concur.  [name] 05/30/18 5:29 PM  --------------------------------------------------------------------------------------------------------------------------------------------------------------------------------------------------------------------------------------------    Subjective:     HPI: Kim Lane is a 84 y.o. female who presents to [facility] today for issues as discussed below.  Bone DEXA scan Bone dexa scan was done on 05-12-18.  She has questions about starting Prolia.  She wanted to discuss with her PCP before getting shots with hematology.   She is walking better than prior.    She has a T score of -2.8. She has  significant osteoporosis and did not tolerate fosamax in the past.   Her hematologist increased her vitamin D from once weekly to twice weekly.   Feet She states her feet are hurting and cramping in the middle of the night. She drinks 2 bottles of water per day but unknown how big these bottles are.   Knee She has been working outside more and has bilateral knee pain.  Finger She complains that her fingers are stuck in the flexed position in her R hand. She is R hand dominant.  Skin She complains of her skin "breaking out" in her legs. She put an unknown ointment on the area prescribed by oncologist which helped.    Wt Readings from Last 3 Encounters:  05/30/18 123 lb 14.4 oz (56.2 kg)  04/19/18 125 lb 11.2 oz (57 kg)  04/05/18 124 lb 11.2 oz (56.6 kg)   BP Readings from Last 3 Encounters:  05/30/18 116/74  04/19/18 133/65  04/05/18 111/74   Pulse Readings from Last 3 Encounters:  05/30/18 71  04/19/18 64  04/05/18 76   BMI Readings from Last 3 Encounters:  05/30/18 21.27 kg/m  04/19/18 21.58 kg/m  04/05/18 21.40 kg/m     Patient Care Team    Relationship Specialty Notifications Start End  [name], DO PCP - General Family Medicine  07/02/17   Josue Hector, MD Consulting Physician Cardiology  07/02/17   Penni Bombard, MD Consulting Physician Neurology  07/02/17   Jarome Matin, MD Consulting Physician Dermatology  07/02/17   Rozetta Nunnery, MD Consulting Physician Otolaryngology  07/02/17   Brunetta Genera, MD Consulting Physician Hematology  07/02/17   Melina Schools, MD Consulting Physician Orthopedic Surgery  07/02/17   Josue Hector, MD Consulting Physician Cardiology  09/16/17      Patient Active Problem List   Diagnosis Date Noted  . Glucose intolerance (impaired glucose tolerance) 11/24/2017    Priority: High  . Hodgkin lymphoma ([place]) 04/26/2017    Priority: High  . Essential hypertension, benign 01/08/2009    Priority: High    . Coronary atherosclerosis- sees Dr. Johnsie Cancel 01/08/2009    Priority: High  . Hypothyroidism 01/08/2009    Priority: High  . Closed compression fracture of L2 lumbar vertebra 06/24/2017    Priority: Medium  . Symptomatic anemia 04/26/2017    Priority: Medium  . HYPERCHOLESTEROLEMIA, MIXED 01/08/2009    Priority: Medium  . Osteoporosis 07/23/2017    Priority: Low  . CAD S/P percutaneous coronary angioplasty 07/02/2017    Priority: Low  . S/P kyphoplasty- dr Rolena Infante- ortho 04/05/2018  . Postmenopausal 04/05/2018  . Closed fracture of multiple ribs 04/05/2018  . Allergic contact dermatitis due to cosmetics 01/26/2018  . Trigger finger, acquired- R 3rd digit 01/26/2018  . Osteoarthritis of finger 01/26/2018  . Vitamin D deficiency 11/24/2017  . Osteoarthritis 09/16/2017  . History of iron deficiency anemia-  by ONC 07/23/2017  . Abnormal nuclear cardiac imaging test 06/16/2017  . Malnutrition of moderate degree 04/27/2017  . Port catheter in place 02/02/2017  . Nodular sclerosis Hodgkin lymphoma of lymph nodes of neck ([place]) 01/13/2017  . Dyspnea 09/14/2012  . Epistaxis 06/02/2011    Past Medical history, Surgical history, Family history, Social history, Allergies and Medications have been entered into the medical record, reviewed  and changed as needed.    Current Meds  Medication Sig  . aspirin 81 MG tablet Take 81 mg by mouth daily.    . carvedilol (COREG) 3.125 MG tablet TAKE 1 TABLET BY MOUTH EVERY OTHER MORNING  . Coenzyme Q10 100 MG capsule Take 100 mg by mouth daily.   [name] Kitchen levothyroxine (SYNTHROID, LEVOTHROID) 88 MCG tablet Take 1 tablet (88 mcg total) by mouth daily before breakfast.  . Melatonin 2.5 MG CAPS Take 1 capsule by mouth at bedtime.  . Multiple Vitamins-Minerals (CENTRUM SILVER PO) Take 1 tablet by mouth daily.    . nitroGLYCERIN (NITROLINGUAL) 0.4 MG/SPRAY spray Place 1 spray under the tongue every 5 (five) minutes x 3 doses as needed for chest pain (3 sprays  max).  . Omega-3 Fatty Acids (FISH OIL) 1000 MG CAPS Take 1 capsule by mouth daily.   [name] Kitchen PROAIR HFA 108 (90 Base) MCG/ACT inhaler Inhale 2 puffs into the lungs every 4 (four) hours as needed for shortness of breath.   . RESTASIS 0.05 % ophthalmic emulsion Place 2 drops into both eyes 2 (two) times daily. For dry eyes  . triamcinolone cream (KENALOG) 0.1 % Apply 1 application topically 2 (two) times daily.  . Vitamin D, Ergocalciferol, (DRISDOL) 50000 units CAPS capsule Take 1 capsule (50,000 Units total) by mouth 2 (two) times a week.    Allergies:  Allergies  Allergen Reactions  . Fosamax [Alendronate Sodium] Other (See Comments)    "made me ache all over"     Review of Systems:  A fourteen system review of systems was performed and found to be positive as per HPI.   Objective:   Blood pressure 116/74, pulse 71, height 5\' 4"  (1.626 m), weight 123 lb 14.4 oz (56.2 kg), SpO2 98 %. Body mass index is 21.27 kg/m. General:  Well Developed, well nourished, appropriate for stated age.  Neuro:  Alert and oriented,  extra-ocular muscles intact  HEENT:  Normocephalic, atraumatic, neck supple, no carotid bruits appreciated  Skin:  no gross rash, warm, pink. A few, small erythematous papules inside proximal L thigh. No signs of infection  Cardiac:  RRR, S1 S2 Respiratory:  ECTA B/L and A/P, Not using accessory muscles, speaking in full sentences- unlabored. Vascular:  Ext warm, no cyanosis apprec.; cap RF less 2 sec. Psych:  No HI/SI, judgement and insight good, Euthymic mood. Full Affect.

## 2018-05-30 NOTE — Patient Instructions (Signed)
You will be due for your yearly fasting blood work around 09/08/2018 however, if you would like to come in sooner to recheck your A1c to see what your three-month average of your blood sugars is, you can come around 07/05/18 or a little later.   Osteoporosis Osteoporosis is the thinning and loss of density in the bones. Osteoporosis makes the bones more brittle, fragile, and likely to break (fracture). Over time, osteoporosis can cause the bones to become so weak that they fracture after a simple fall. The bones most likely to fracture are the bones in the hip, wrist, and spine. What are the causes? The exact cause is not known. What increases the risk? Anyone can develop osteoporosis. You may be at greater risk if you have a family history of the condition or have poor nutrition. You may also have a higher risk if you are:  Female.  15 years old or older.  A smoker.  Not physically active.  White or Asian.  Slender.  What are the signs or symptoms? A fracture might be the first sign of the disease, especially if it results from a fall or injury that would not usually cause a bone to break. Other signs and symptoms include:  Low back and neck pain.  Stooped posture.  Height loss.  How is this diagnosed? To make a diagnosis, your health care provider may:  Take a medical history.  Perform a physical exam.  Order tests, such as: ? A bone mineral density test. ? A dual-energy X-ray absorptiometry test.  How is this treated? The goal of osteoporosis treatment is to strengthen your bones to reduce your risk of a fracture. Treatment may involve:  Making lifestyle changes, such as: ? Eating a diet rich in calcium. ? Doing weight-bearing and muscle-strengthening exercises. ? Stopping tobacco use. ? Limiting alcohol intake.  Taking medicine to slow the process of bone loss or to increase bone density.  Monitoring your levels of calcium and vitamin D.  Follow these  instructions at home:  Include calcium and vitamin D in your diet. Calcium is important for bone health, and vitamin D helps the body absorb calcium.  Perform weight-bearing and muscle-strengthening exercises as directed by your health care provider.  Do not use any tobacco products, including cigarettes, chewing tobacco, and electronic cigarettes. If you need help quitting, ask your health care provider.  Limit your alcohol intake.  Take medicines only as directed by your health care provider.  Keep all follow-up visits as directed by your health care provider. This is important.  Take precautions at home to lower your risk of falling, such as: ? Keeping rooms well lit and clutter free. ? Installing safety rails on stairs. ? Using rubber mats in the bathroom and other areas that are often wet or slippery. Get help right away if: You fall or injure yourself. This information is not intended to replace advice given to you by your health care provider. Make sure you discuss any questions you have with your health care provider. Document Released: 09/16/2005 Document Revised: 05/11/2016 Document Reviewed: 05/17/2014 Elsevier Interactive Patient Education  2018 [employer].     Preventing Osteoporosis, Adult Osteoporosis is a condition that causes the bones to get weaker. With osteoporosis, the bones become thinner, and the normal spaces in bone tissue become larger. This can make the bones weak and cause them to break more easily. People who have osteoporosis are more likely to break their wrist, spine, or hip. Even a  minor accident or injury can be enough to break weak bones. Osteoporosis can occur with aging. Your body constantly replaces old bone tissue with new tissue. As you get older, you may lose bone tissue more quickly, or it may be replaced more slowly. Osteoporosis is more likely to develop if you have poor nutrition or do not get enough calcium or vitamin D. Other lifestyle  factors can also play a role. By making some diet and lifestyle changes, you can help to keep your bones healthy and help to prevent osteoporosis. What nutrition changes can be made? Nutrition plays an important role in maintaining healthy, strong bones.  Make sure you get enough calcium every day from food or from calcium supplements. ? If you are age 32 or younger, aim to get 1,000 mg of calcium every day. ? If you are older than age 41, aim to get 1,200 mg of calcium every day.  Try to get enough vitamin D every day. ? If you are age 71 or younger, aim to get 600 international units (IU) every day. ? If you are older than age 47, aim to get 800 international units every day.  Follow a healthy diet. Eat plenty of foods that contain calcium and vitamin D. ? Calcium is in milk, cheese, yogurt, and other dairy products. Some fish and vegetables are also good sources of calcium. Many foods such as cereals and breads have had calcium added to them (are fortified). Check nutrition labels to see how much calcium is in a food or drink. ? Foods that contain vitamin D include milk, cereals, salmon, and tuna. Your body also makes vitamin D when you are out in the sun. Bare skin exposure to the sun on your face, arms, legs, or back for no more than 30 minutes a day, 2 times per week is more than enough. Beyond that, it is important to use sunblock to protect your skin from sunburn, which increases your risk for skin cancer.  What lifestyle changes can be made? Making changes in your everyday life can also play an important role in preventing osteoporosis.  Stay active and get exercise every day. Ask your health care provider what types of exercise are best for you.  Do not use any products that contain nicotine or tobacco, such as cigarettes and e-cigarettes. If you need help quitting, ask your health care provider.  Limit alcohol intake to no more than 1 drink a day for nonpregnant women and 2 drinks a  day for men. One drink equals 12 oz of beer, 5 oz of wine, or 1 oz of hard liquor.  Why are these changes important? Making these nutrition and lifestyle changes can:  Help you develop and maintain healthy, strong bones.  Prevent loss of bone mass and the problems that are caused by that loss, such as broken bones and delayed healing.  Make you feel better mentally and physically.  What can happen if changes are not made? Problems that can result from osteoporosis can be very serious. These may include:  A higher risk of broken bones that are painful and do not heal well.  Physical malformations, such as a collapsed spine or a hunched back.  Problems with movement.  Where to find support: If you need help making changes to prevent osteoporosis, talk with your health care provider. You can ask for a referral to a diet and nutrition specialist (dietitian) and a physical therapist. Where to find more information: Learn more about osteoporosis  from:  NIH Osteoporosis and Related [place]: www.niams.GolfingGoddess.com.br  U[place] Office on Women's Health: SouvenirBaseball.es.html  National Osteoporosis Foundation: ProfilePeek.ch  Summary  Osteoporosis is a condition that causes weak bones that are more likely to break.  Eating a healthy diet and making sure you get enough calcium and vitamin D can help prevent osteoporosis.  Other ways to reduce your risk of osteoporosis include getting regular exercise and avoiding alcohol and products that contain nicotine or tobacco. This information is not intended to replace advice given to you by your health care provider. Make sure you discuss any questions you have with your health care provider. Document Released: 12/22/2015 Document Revised: 08/17/2016 Document Reviewed: 08/17/2016 Elsevier  Interactive Patient Education  2018 [place] factors for prediabetes and type 2 diabetes  Researchers don't fully understand why some people develop prediabetes and type 2 diabetes and others don't.  It's clear that certain factors increase the risk, however, including:  Weight. The more fatty tissue you have, the more resistant your cells become to insulin.  Inactivity. The less active you are, the greater your risk. Physical activity helps you control your weight, uses up glucose as energy and makes your cells more sensitive to insulin.  Family history. Your risk increases if a parent or sibling has type 2 diabetes.  Race. Although it's unclear why, people of certain races - including blacks, Hispanics, American Indians and Asian-Americans - are at higher risk.  Age. Your risk increases as you get older. This may be because you tend to exercise less, lose muscle mass and gain weight as you age. But type 2 diabetes is also increasing dramatically among children, adolescents and younger adults.  Gestational diabetes. If you developed gestational diabetes when you were pregnant, your risk of developing prediabetes and type 2 diabetes later increases. If you gave birth to a baby weighing more than 9 pounds (4 kilograms), you're also at risk of type 2 diabetes.  Polycystic ovary syndrome. For women, having polycystic ovary syndrome - a common condition characterized by irregular menstrual periods, excess hair growth and obesity - increases the risk of diabetes.  High blood pressure. Having blood pressure over 140/90 millimeters of mercury (mm Hg) is linked to an increased risk of type 2 diabetes.  Abnormal cholesterol and triglyceride levels. If you have low levels of high-density lipoprotein (HDL), or "good," cholesterol, your risk of type 2 diabetes is higher. Triglycerides are another type of fat carried in the blood. People with high levels of triglycerides have an increased  risk of type 2 diabetes. Your doctor can let you know what your cholesterol and triglyceride levels are.  A good guide to good carbs: The glycemic index ---If you have diabetes, or at risk for diabetes, you know all too well that when you eat carbohydrates, your blood sugar goes up. The total amount of carbs you consume at a meal or in a snack mostly determines what your blood sugar will do. But the food itself also plays a role. A serving of white rice has almost the same effect as eating pure table sugar - a quick, high spike in blood sugar. A serving of lentils has a slower, smaller effect.  ---Picking good sources of carbs can help you control your blood sugar and your weight. Even if you don't have diabetes, eating healthier carbohydrate-rich foods can help ward off a host of chronic conditions, from heart disease to various cancers to,  well, diabetes.  ---One way to choose foods is with the glycemic index (GI). This tool measures how much a food boosts blood sugar.  The glycemic index rates the effect of a specific amount of a food on blood sugar compared with the same amount of pure glucose. A food with a glycemic index of 28 boosts blood sugar only 28% as much as pure glucose. One with a GI of 95 acts like pure glucose.    High glycemic foods result in a quick spike in insulin and blood sugar (also known as blood glucose).  Low glycemic foods have a slower, smaller effect- these are healthier for you.   Using the glycemic index Using the glycemic index is easy: choose foods in the low GI category instead of those in the high GI category (see below), and go easy on those in between. Low glycemic index (GI of 55 or less): Most fruits and vegetables, beans, minimally processed grains, pasta, low-fat dairy foods, and nuts.  Moderate glycemic index (GI 56 to 69): White and sweet potatoes, corn, white rice, couscous, breakfast cereals such as Cream of Wheat and Mini Wheats.  High glycemic index  (GI of 70 or higher): White bread, rice cakes, most crackers, bagels, cakes, doughnuts, croissants, most packaged breakfast cereals. You can see the values for 100 commons foods and get links to more at www.health.CheapToothpicks.si.  Swaps for lowering glycemic index  Instead of this high-glycemic index food Eat this lower-glycemic index food  White rice Brown rice or converted rice  Instant oatmeal Steel-cut oats  Cornflakes Bran flakes  Baked potato Pasta, bulgur  White bread Whole-grain bread  Corn Peas or leafy greens       Prediabetes Eating Plan  Prediabetes--also called impaired glucose tolerance or impaired fasting glucose--is a condition that causes blood sugar (blood glucose) levels to be higher than normal. Following a healthy diet can help to keep prediabetes under control. It can also help to lower the risk of type 2 diabetes and heart disease, which are increased in people who have prediabetes. Along with regular exercise, a healthy diet:  Promotes weight loss.  Helps to control blood sugar levels.  Helps to improve the way that the body uses insulin.   WHAT DO I NEED TO KNOW ABOUT THIS EATING PLAN?   Use the glycemic index (GI) to plan your meals. The index tells you how quickly a food will raise your blood sugar. Choose low-GI foods. These foods take a longer time to raise blood sugar.  Pay close attention to the amount of carbohydrates in the food that you eat. Carbohydrates increase blood sugar levels.  Keep track of how many calories you take in. Eating the right amount of calories will help you to achieve a healthy weight. Losing about 7 percent of your starting weight can help to prevent type 2 diabetes.  You may want to follow a Mediterranean diet. This diet includes a lot of vegetables, lean meats or fish, whole grains, fruits, and healthy oils and fats.   WHAT FOODS CAN I EAT?  Grains Whole grains, such as whole-wheat or whole-grain breads,  crackers, cereals, and pasta. Unsweetened oatmeal. Bulgur. Barley. Quinoa. Brown rice. Corn or whole-wheat flour tortillas or taco shells. Vegetables Lettuce. Spinach. Peas. Beets. Cauliflower. Cabbage. Broccoli. Carrots. Tomatoes. Squash. Eggplant. Herbs. Peppers. Onions. Cucumbers. Brussels sprouts. Fruits Berries. Bananas. Apples. Oranges. Grapes. Papaya. Mango. Pomegranate. Kiwi. Grapefruit. Cherries. Meats and Other Protein Sources Seafood. Lean meats, such as chicken and [place] or  lean cuts of pork and beef. Tofu. Eggs. Nuts. Beans. Dairy Low-fat or fat-free dairy products, such as yogurt, cottage cheese, and cheese. Beverages Water. Tea. Coffee. Sugar-free or diet soda. Seltzer water. Milk. Milk alternatives, such as soy or almond milk. Condiments Mustard. Relish. Low-fat, low-sugar ketchup. Low-fat, low-sugar barbecue sauce. Low-fat or fat-free mayonnaise. Sweets and Desserts Sugar-free or low-fat pudding. Sugar-free or low-fat ice cream and other frozen treats. Fats and Oils Avocado. Walnuts. Olive oil. The items listed above may not be a complete list of recommended foods or beverages. Contact your dietitian for more options.    WHAT FOODS ARE NOT RECOMMENDED?  Grains Refined white flour and flour products, such as bread, pasta, snack foods, and cereals. Beverages Sweetened drinks, such as sweet iced tea and soda. Sweets and Desserts Baked goods, such as cake, cupcakes, pastries, cookies, and cheesecake. The items listed above may not be a complete list of foods and beverages to avoid. Contact your dietitian for more information.   This information is not intended to replace advice given to you by your health care provider. Make sure you discuss any questions you have with your health care provider.   Document Released: 04/23/2015 Document Reviewed: 04/23/2015 Elsevier Interactive Patient Education [employer].

## 2018-07-06 NOTE — Progress Notes (Signed)
HEMATOLOGY/ONCOLOGY CLINIC NOTE  Date of Service: 07/07/18  Patient Care Team: [name], DO as PCP - General (Family Medicine) Josue Hector, MD as Consulting Physician (Cardiology) Penni Bombard, MD as Consulting Physician (Neurology) Jarome Matin, MD as Consulting Physician (Dermatology) Rozetta Nunnery, MD as Consulting Physician (Otolaryngology) Brunetta Genera, MD as Consulting Physician (Hematology) Melina Schools, MD as Consulting Physician (Orthopedic Surgery) Josue Hector, MD as Consulting Physician (Cardiology)  CHIEF COMPLAINTS/PURPOSE OF CONSULTATION:   f/u for continued treatment of Hodgkin's lymphoma  HISTORY OF PRESENTING ILLNESS:   plz see previous   INTERVAL HISTORY   Kim Lane is here for scheduled follow up for her Hodgkins lymphoma. The patient's last visit with [place] was on 04/19/18. The pt reports that she is doing well overall and [place]. Verbal consent has been given by the pt for a clinical observer, Mathis Fare, to be present.   The pt reports that she has had some difficulty sleeping recently, which is different for her. She has been using melatonin which has helped. She also notes that her back has begun feeling much better. She is continuing to take 2 high dose Vitamin D replacements each week.   She denies any medication changes. She notes that she has recently noticed a fungal infection on her toe nails.   The pt had a Bone Density study on 05/12/18 which revealed The BMD measured at Femur Total Left is 0.649 g/cm2 with a T-score of -2.8. This patient is considered OSTEOPOROTIC according to [facility]) criteria.   Lab results today (07/07/18) of CBC w/diff, BMP is as follows: all values are WNL except for Eosinophils abs at 600, Glucose at 103, GFR at 51.  Sed Rate 07/07/18 is 17 Vitamin D25 hydroxy 07/07/18 is 92  On review of systems, pt reports  toe nail fungus, some recent difficulty sleeping, good energy levels, and denies fevers, chills, night sweats, new fatigue, unexpected weight loss, noticing any new lumps or bumps, abdominal pains, leg swelling, and any other symptoms.    MEDICAL HISTORY:  Past Medical History:  Diagnosis Date  . Anemia   . Anginal pain ([place])    occ  . Blood dyscrasia 12/2016   hodgins lymphoma tx-remission  . CAD (coronary artery disease) 02/2006   Taxus stent placed to LAD and Diagonal per Dr. Olevia Perches  . Cataracts, bilateral   . Complication of anesthesia   . HTN (hypertension)   . Hypercholesterolemia   . Hypothyroidism   . IBS (irritable bowel syndrome)   . Osteoarthritis   . PONV (postoperative nausea and vomiting)   . Stroke [facility]) 03/2016  Osteoporosis Cardiologist Dr. [name] -Dr. Earlean Shawl  SURGICAL HISTORY: Past Surgical History:  Procedure Laterality Date  . APPENDECTOMY    . IR GENERIC HISTORICAL  01/25/2017   IR US GUIDE VASC ACCESS RIGHT 01/25/2017 [name], MD [facility]-INTERV RAD  . IR GENERIC HISTORICAL  01/25/2017   IR FLUORO GUIDE PORT INSERTION RIGHT 01/25/2017 [name], MD [facility]-INTER[facility] RAD  . IR REMOVAL TUN ACCESS W/ PORT W/O FL MOD SED  10/28/2017  . KYPHOPLASTY N/A 06/24/2017   Procedure: KYPHOPLASTY T10;  Surgeon: Melina Schools, MD;  Location: [place];  Service: Orthopedics;  Laterality: N/A;  90 mins  . LEFT HEART CATH AND CORONARY ANGIOGRAPHY N/A 06/16/2017   Procedure: Left Heart Cath and Coronary Angiography;  Surgeon: Sherren Mocha, MD;  Location: [facility]  INVASIVE CV LAB;  Service: Cardiovascular;  Laterality: N/A;  . MASS EXCISION Right 12/22/2016   Procedure: RIGHT NECK LYMPH NODE BIOPSY;  Surgeon: Rozetta Nunnery, MD;  Location: [place];  Service: ENT;  Laterality: Right;  . SPINE SURGERY    . stents     in heart  . TONSILLECTOMY    . TOTAL KNEE ARTHROPLASTY Bilateral 2011,2012  . VAGINAL HYSTERECTOMY      SOCIAL HISTORY: Social History    Socioeconomic History  . Marital status: Widowed    Spouse name: Not on file  . Number of children: 2  . Years of education: 65  . 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 Financial resource strain: Not on file  . Food insecurity:    Worry: Not on file    Inability: Not on file  . Transportation needs:    Medical: Not on file    Non-medical: Not on file  Tobacco Use  . Smoking status: Former Research scientist (life sciences)  . Smokeless tobacco: Never Used  . Tobacco comment: quit smoking 30 years ago, very light smoker  Substance and Sexual Activity  . Alcohol use: No    Alcohol/week: 0.0 oz  . Drug use: No  . Sexual activity: Not on file  Lifestyle  . Physical activity:    Days per week: Not on file    Minutes per session: Not on file  . Stress: Not on file  Relationships  . Social connections:    Talks on phone: Not on file    Gets together: Not on file    Attends religious service: Not on file    Active member of club or organization: Not on file    Attends meetings of clubs or organizations: Not on file    Relationship status: Not on file  . Intimate partner violence:    Fear of current or ex partner: Not on file    Emotionally abused: Not on file    Physically abused: Not on file    Forced sexual activity: Not on file  Other Topics Concern  . Not on file  Social History Narrative   Lives alone   caffeine use- coffee- 5 cups daily    FAMILY HISTORY: Family History  Problem Relation Age of Onset  . Diabetes Mother   . Dementia Mother   . ALS Mother   . Heart Problems Father   . Liver disease Sister   . Cirrhosis Sister   . Heart block Unknown        CABG  . Diabetes Sister   . Heart block Son        CABG  . Other [place]  . Other Daughter        [place]  . Dementia Sister     ALLERGIES:  is allergic to fosamax [alendronate sodium].  MEDICATIONS:   Current Outpatient Medications  Medication Sig Dispense Refill  . aspirin 81 MG tablet Take 81 mg by mouth daily.      . carvedilol (COREG) 3.125 MG tablet TAKE 1 TABLET BY MOUTH EVERY OTHER MORNING 45 tablet 0  . Coenzyme Q10 100 MG capsule Take 100 mg by mouth daily.     [name] Kitchen levothyroxine (SYNTHROID, LEVOTHROID) 88 MCG tablet Take 1 tablet (88 mcg total) by mouth daily before breakfast. 90 tablet 0  .  Melatonin 2.5 MG CAPS Take 1 capsule by mouth at bedtime.    . Multiple Vitamins-Minerals (CENTRUM SILVER PO) Take 1 tablet by mouth daily.      . nitroGLYCERIN (NITROLINGUAL) 0.4 MG/SPRAY spray Place 1 spray under the tongue every 5 (five) minutes x 3 doses as needed for chest pain (3 sprays max).    . Omega-3 Fatty Acids (FISH OIL) 1000 MG CAPS Take 1 capsule by mouth daily.     [name] Kitchen PROAIR HFA 108 (90 Base) MCG/ACT inhaler Inhale 2 puffs into the lungs every 4 (four) hours as needed for shortness of breath.     . RESTASIS 0.05 % ophthalmic emulsion Place 2 drops into both eyes 2 (two) times daily. For dry eyes    . triamcinolone cream (KENALOG) 0.1 % Apply 1 application topically 2 (two) times daily. 60 g 0  . Vitamin D, Ergocalciferol, (DRISDOL) 50000 units CAPS capsule Take 1 capsule (50,000 Units total) by mouth 2 (two) times a week. 24 capsule 2   No current facility-administered medications for this visit.     REVIEW OF SYSTEMS:  A 10+ POINT REVIEW OF SYSTEMS WAS OBTAINED including neurology, dermatology, psychiatry, cardiac, respiratory, lymph, extremities, GI, GU, Musculoskeletal, constitutional, breasts, reproductive, HEENT.  All pertinent positives are noted in the HPI.  All others are negative.   PHYSICAL EXAMINATION:  ECOG PERFORMANCE STATUS: 2 - Symptomatic, <50% confined to bed  Vitals:   07/07/18 1149  BP: 135/77  Pulse: 70  Resp: 18  Temp: 98.2 F (36.8 C)  TempSrc: Oral  SpO2: 98%  Weight: 125 lb 3.2 oz (56.8 kg)  Height: 5\' 4"  (1.626 m)  .  GENERAL:alert, in no  acute distress and comfortable SKIN: no acute rashes, no significant lesions, onychomycosis  EYES: conjunctiva are pink and non-injected, sclera anicteric OROPHARYNX: MMM, no exudates, no oropharyngeal erythema or ulceration NECK: supple, no JVD LYMPH:  no palpable lymphadenopathy in the cervical, axillary or inguinal regions LUNGS: clear to auscultation b/l with normal respiratory effort HEART: regular rate & rhythm, chest wall tenderness to palpation ABDOMEN:  normoactive bowel sounds , non tender, not distended. No palpable hepatosplenomegaly.  Extremity: no pedal edema PSYCH: alert & oriented x 3 with fluent speech NEURO: no focal motor/sensory deficits   LABORATORY DATA:  I have reviewed the data as listed   . CBC Latest Ref Rng & Units 07/07/2018 04/19/2018 01/06/2018  WBC 3.9 - 10.3 K/uL 7.4 7.3 6.8  Hemoglobin 11.6 - 15.9 g/dL 12.5 12.7 12.5  Hematocrit 34.8 - 46.6 % 38.2 38.8 38.6  Platelets 145 - 400 K/uL 323 342 342   . CMP Latest Ref Rng & Units 07/07/2018 04/19/2018 01/06/2018  Glucose 70 - 99 mg/dL 103(H) 115 87  BUN 8 - 23 mg/dL 21 19 19   Creatinine 0.44 - 1.00 mg/dL 1.00 0.98 0.89  Sodium 135 - 145 mmol/L 140 139 141  Potassium 3.5 - 5.1 mmol/L 4.5 4.7 4.4  Chloride 98 - 111 mmol/L 104 103 106  CO2 22 - 32 mmol/L 28 29 28   Calcium 8.9 - 10.3 mg/dL 9.4 9.7 9.5  Total Protein 6.4 - 8.3 g/dL - 7.3 7.1  Total Bilirubin 0.2 - 1.2 mg/dL - 0.5 0.4  Alkaline Phos 40 - 150 U/L - 82 72  AST 5 - 34 U/L - 24 19  ALT 0 - 55 U/L - 16 16   Erythrocyte Sedimentation Rate     Component Value Date/Time   ESRSEDRATE 17 07/07/2018 1133   ESRSEDRATE  20 10/07/2017 1115    RADIOGRAPHIC STUDIES: I have personally reviewed the radiological images as listed and agreed with the findings in the report. No results found.   ASSESSMENT & PLAN:   84 y.o. Caucasian female with ECOG performance status of 2 with  1) Classical Hodgkin's lymphoma of nodular sclerosis variety At least Stage  IIIB some concern for possible Rt lung involvement which makes it Stage IVB Presented with right neck lymph nodes . She had type B constitutional symptoms with 15-20 pound weight loss night sweats or chills .noted to have significant pruritus with mild rash likely from Hodgkin's which remain resolved  Wt Readings from Last 3 Encounters:  07/07/18 125 lb 3.2 oz (56.8 kg)  05/30/18 123 lb 14.4 oz (56.2 kg)  04/19/18 125 lb 11.2 oz (57 kg)  PET/CT scan after 5 cycles shows marked response to chemotherapy and stable predominantly [place] 2 as per discussion with the radiology.  2) Microcytic anemia - likely anemia of chronic disease due to Hodgkin's lymphoma. anemia resolved, Hg stable   PLAN:  -Discussed pt labwork today, 07/07/18; blood chemistries are stable,  -The pt shows no clinical or lab progression of Hodgkin's Lymphoma at this time.  -No residual toxicities -No indication for further treatment at this time.   -Ciclopirox for onychomycosis of toe nails -Will see pt back in 3 months   3) T10 compression fracture based on imaging done on 05/22/2017 in ED Now status post kyphoplasty with significant improvement in her back pain  4) Osteoporosis PET/CT scan also shows possible L4 compression fracture . These compression fractures appear to be related to osteoporosis . Also had rib fracture related to no significant trauma She was previously on Fosamax that cause generalized body aches .  PLAN:  \-I explained she should notify us if she is thinking of doing invasive dental procedures while on Prolia.  -She is to continue ergocalciferol 50k twice weekly to target a 25OH vit D level of close to 50-60 - levels adequate can reduce dose to once weekly -Continue with Prolia injection today, and every 6 months  5) recently noted triple-vessel coronary artery disease based on cardiac cath: patient is surprisingly asymptomatic. -Continue management as per PCP and cardiology.  6) . Patient  Active Problem List   Diagnosis Date Noted  . S/P kyphoplasty- dr Rolena Infante- ortho 04/05/2018  . Postmenopausal 04/05/2018  . Closed fracture of multiple ribs 04/05/2018  . Allergic contact dermatitis due to cosmetics 01/26/2018  . Trigger finger, acquired- R 3rd digit 01/26/2018  . Osteoarthritis of finger 01/26/2018  . Vitamin D deficiency 11/24/2017  . Glucose intolerance (impaired glucose tolerance) 11/24/2017  . Osteoarthritis 09/16/2017  . History of iron deficiency anemia-  by ONC 07/23/2017  . Osteoporosis 07/23/2017  . CAD S/P percutaneous coronary angioplasty 07/02/2017  . Closed compression fracture of L2 lumbar vertebra 06/24/2017  . Abnormal nuclear cardiac imaging test 06/16/2017  . Malnutrition of moderate degree 04/27/2017  . Symptomatic anemia 04/26/2017  . Hodgkin lymphoma ([place]) 04/26/2017  . Port catheter in place 02/02/2017  . Nodular sclerosis Hodgkin lymphoma of lymph nodes of neck ([place]) 01/13/2017  . Dyspnea 09/14/2012  . Epistaxis 06/02/2011  . HYPERCHOLESTEROLEMIA, MIXED 01/08/2009  . Essential hypertension, benign 01/08/2009  . Coronary atherosclerosis- sees Dr. [place] Cancel 01/08/2009  . Hypothyroidism 01/08/2009  PLAN  -Continue follow-up with primary care physician for management of other chronic medical co-morbidities.   RTC with Dr [name] with labs in 3 months  Continue Prolia q37months  All of the patients questions were answered with apparent satisfaction. The patient knows to call the clinic with any problems, questions or concerns.  The total time spent in the appt was 25 minutes and more than 50% was on counseling and direct patient cares.    Verbal consent has been given by the pt for a clinical observer, Mathis Fare, to be present.    Sullivan Lone MD [place] AAHIVMS [facility] Hematology/Oncology Physician [facility]  (Office):       [phone] (Work cell):  [phone] (Fax):           [phone]  I, Baldwin [place], am  acting as a scribe for Dr [name].   .I have reviewed the above documentation for accuracy and completeness, and I agree with the above. Brunetta Genera MD

## 2018-07-07 ENCOUNTER — Inpatient Hospital Stay (HOSPITAL_BASED_OUTPATIENT_CLINIC_OR_DEPARTMENT_OTHER): Payer: Medicare Other | Admitting: Hematology

## 2018-07-07 ENCOUNTER — Inpatient Hospital Stay: Payer: Medicare Other

## 2018-07-07 ENCOUNTER — Inpatient Hospital Stay: Payer: Medicare Other | Attending: Hematology

## 2018-07-07 ENCOUNTER — Telehealth: Payer: Self-pay | Admitting: Hematology

## 2018-07-07 VITALS — BP 135/77 | HR 70 | Temp 98.2°F | Resp 18 | Ht 64.0 in | Wt 125.2 lb

## 2018-07-07 DIAGNOSIS — C8190 Hodgkin lymphoma, unspecified, unspecified site: Secondary | ICD-10-CM

## 2018-07-07 DIAGNOSIS — E039 Hypothyroidism, unspecified: Secondary | ICD-10-CM | POA: Insufficient documentation

## 2018-07-07 DIAGNOSIS — E559 Vitamin D deficiency, unspecified: Secondary | ICD-10-CM | POA: Diagnosis not present

## 2018-07-07 DIAGNOSIS — C8118 Nodular sclerosis classical Hodgkin lymphoma, lymph nodes of multiple sites: Secondary | ICD-10-CM

## 2018-07-07 DIAGNOSIS — M818 Other osteoporosis without current pathological fracture: Secondary | ICD-10-CM | POA: Insufficient documentation

## 2018-07-07 DIAGNOSIS — Z95828 Presence of other vascular implants and grafts: Secondary | ICD-10-CM

## 2018-07-07 DIAGNOSIS — M8000XG Age-related osteoporosis with current pathological fracture, unspecified site, subsequent encounter for fracture with delayed healing: Secondary | ICD-10-CM

## 2018-07-07 LAB — CBC WITH DIFFERENTIAL (CANCER CENTER ONLY)
Basophils Absolute: 0.1 10*3/uL (ref 0.0–0.1)
Basophils Relative: 1 %
Eosinophils Absolute: 0.6 10*3/uL — ABNORMAL HIGH (ref 0.0–0.5)
Eosinophils Relative: 8 %
HCT: 38.2 % (ref 34.8–46.6)
Hemoglobin: 12.5 g/dL (ref 11.6–15.9)
Lymphocytes Relative: 19 %
Lymphs Abs: 1.4 10*3/uL (ref 0.9–3.3)
MCH: 29.4 pg (ref 25.1–34.0)
MCHC: 32.7 g/dL (ref 31.5–36.0)
MCV: 89.9 fL (ref 79.5–101.0)
Monocytes Absolute: 0.6 10*3/uL (ref 0.1–0.9)
Monocytes Relative: 8 %
Neutro Abs: 4.7 10*3/uL (ref 1.5–6.5)
Neutrophils Relative %: 64 %
Platelet Count: 323 10*3/uL (ref 145–400)
RBC: 4.25 MIL/uL (ref 3.70–5.45)
RDW: 14.3 % (ref 11.2–14.5)
WBC Count: 7.4 10*3/uL (ref 3.9–10.3)

## 2018-07-07 LAB — BASIC METABOLIC PANEL
Anion gap: 8 (ref 5–15)
BUN: 21 mg/dL (ref 8–23)
CO2: 28 mmol/L (ref 22–32)
Calcium: 9.4 mg/dL (ref 8.9–10.3)
Chloride: 104 mmol/L (ref 98–111)
Creatinine, Ser: 1 mg/dL (ref 0.44–1.00)
GFR calc Af Amer: 59 mL/min — ABNORMAL LOW (ref >60)
GFR calc non Af Amer: 51 mL/min — ABNORMAL LOW (ref >60)
Glucose, Bld: 103 mg/dL — ABNORMAL HIGH (ref 70–99)
Potassium: 4.5 mmol/L (ref 3.5–5.1)
Sodium: 140 mmol/L (ref 135–145)

## 2018-07-07 LAB — SEDIMENTATION RATE: Sed Rate: 17 mm/hr (ref 0–22)

## 2018-07-07 MED ORDER — DENOSUMAB 60 MG/ML ~~LOC~~ SOSY
60.0000 mg | PREFILLED_SYRINGE | Freq: Once | SUBCUTANEOUS | Status: AC
  Administered 2018-07-07: 60 mg via SUBCUTANEOUS

## 2018-07-07 MED ORDER — DENOSUMAB 60 MG/ML ~~LOC~~ SOSY
PREFILLED_SYRINGE | SUBCUTANEOUS | Status: AC
  Filled 2018-07-07: qty 1

## 2018-07-07 NOTE — Patient Instructions (Signed)

## 2018-07-07 NOTE — Telephone Encounter (Signed)
Gave patient avs and calendar of upcoming oct appt.

## 2018-07-08 LAB — VITAMIN D 25 HYDROXY (VIT D DEFICIENCY, FRACTURES): Vit D, 25-Hydroxy: 92.3 ng/mL (ref 30.0–100.0)

## 2018-07-13 MED ORDER — CICLOPIROX 8 % EX KIT: 1.0000 "application " | PACK | Freq: Every day | CUTANEOUS | 0 refills | Status: DC

## 2018-07-30 ENCOUNTER — Other Ambulatory Visit: Payer: Self-pay | Admitting: Family Medicine

## 2018-07-30 DIAGNOSIS — I10 Essential (primary) hypertension: Secondary | ICD-10-CM

## 2018-08-10 ENCOUNTER — Ambulatory Visit: Payer: Medicare Other | Admitting: Family Medicine

## 2018-08-10 ENCOUNTER — Encounter: Payer: Self-pay | Admitting: Family Medicine

## 2018-08-10 VITALS — BP 120/77 | HR 62 | Ht 64.0 in | Wt 125.4 lb

## 2018-08-10 DIAGNOSIS — M1990 Unspecified osteoarthritis, unspecified site: Secondary | ICD-10-CM | POA: Insufficient documentation

## 2018-08-10 DIAGNOSIS — Z23 Encounter for immunization: Secondary | ICD-10-CM

## 2018-08-10 DIAGNOSIS — R7302 Impaired glucose tolerance (oral): Secondary | ICD-10-CM | POA: Diagnosis not present

## 2018-08-10 DIAGNOSIS — R252 Cramp and spasm: Secondary | ICD-10-CM | POA: Insufficient documentation

## 2018-08-10 DIAGNOSIS — E039 Hypothyroidism, unspecified: Secondary | ICD-10-CM

## 2018-08-10 DIAGNOSIS — I10 Essential (primary) hypertension: Secondary | ICD-10-CM | POA: Diagnosis not present

## 2018-08-10 DIAGNOSIS — E860 Dehydration: Secondary | ICD-10-CM | POA: Diagnosis not present

## 2018-08-10 DIAGNOSIS — C8190 Hodgkin lymphoma, unspecified, unspecified site: Secondary | ICD-10-CM

## 2018-08-10 DIAGNOSIS — G479 Sleep disorder, unspecified: Secondary | ICD-10-CM

## 2018-08-10 DIAGNOSIS — E559 Vitamin D deficiency, unspecified: Secondary | ICD-10-CM

## 2018-08-10 DIAGNOSIS — E7800 Pure hypercholesterolemia, unspecified: Secondary | ICD-10-CM

## 2018-08-10 LAB — POCT GLYCOSYLATED HEMOGLOBIN (HGB A1C): Hemoglobin A1C: 5.8 % — AB (ref 4.0–5.6)

## 2018-08-10 MED ORDER — TETANUS-DIPHTH-ACELL PERTUSSIS 5-2.5-18.5 LF-MCG/0.5 IM SUSP: 0.5000 mL | Freq: Once | INTRAMUSCULAR | 0 refills | Status: AC

## 2018-08-10 NOTE — Patient Instructions (Signed)
Told patient goal is to take 4 to 5 glasses of water per day which she is only at 2-3.  This is most likely reason for her muscle cramps in her calves, and her toes and fingers sometimes curling up at night.  -Also near future patient is due for full set of yearly fasting blood work around mid September.  She will make an appointment for the blood work and then follow-up with me.

## 2018-08-10 NOTE — Progress Notes (Signed)
Impression and Recommendations:    1. Glucose intolerance (impaired glucose tolerance)   2. Essential hypertension, benign   3. Muscle cramps at night   4. Dehydration, mild   5. Sleep difficulties   6. HYPERCHOLESTEROLEMIA, MIXED   7. Hodgkin lymphoma, unspecified Hodgkin lymphoma type, unspecified body region ([place])   8. Acquired hypothyroidism   9. Vitamin D deficiency   10. Arthritis   11. Need for Tdap vaccination     Muscle Spasms -Encouraged patient to increase water intake and use gentle heat to ease muscle cramping -Cramping likely due to dehydration; will check electrolytes with next blood panel in roughly 1 month -Discussed impact of heat, activity and dehydration on muscle cramping -Discussed the importance of staying active with increased age -Discussed some of the pain may also be due to arthritis and discussed Tylenol and other OTC pain relievers to help  Sleep -Encouraged pt to create a sleep routine and regular bedtimes in order to improve sleeping ability  Health Management -Encouraged pt to return for blood and lipid panel within 1 month  Constipation: -Discussed importance of activity and hydration for regular BMs with increasing age -Encouraged pt to continue making positive dietary changes and increasing daily exercise in order to maintain health -Discussed increasing water intake with advanced age especially with recent high outdoor temperatures    -PT declined need for refills   Orders Placed This Encounter  Procedures  . CBC with Differential/Platelet  . Comprehensive metabolic panel  . Hemoglobin A1c  . Lipid panel  . TSH  . VITAMIN D 25 Hydroxy (Vit-D Deficiency, Fractures)  . T4, free  . Vitamin B12  . POCT glycosylated hemoglobin (Hb A1C)    Meds ordered this encounter  Medications  . Tdap (BOOSTRIX) 5-2.5-18.5 LF-MCG/0.5 injection    Sig: Inject 0.5 mLs into the muscle once for 1 dose.    Dispense:  0.5 mL    Refill:  0      Gross side effects, risk and benefits, and alternatives of medications and treatment plan in general discussed with patient.  Patient is aware that all medications have potential side effects and we are unable to predict every side effect or drug-drug interaction that may occur.   Patient will call with any questions prior to using medication if they have concerns.  Expresses verbal understanding and consents to current therapy and treatment regimen.  No barriers to understanding were identified.  Red flag symptoms and signs discussed in detail.  Patient expressed understanding regarding what to do in case of emergency\urgent symptoms  Please see AVS handed out to patient at the end of our visit for further patient instructions/ counseling done pertaining to today's office visit.   Return for Patient will follow-up around mid-September to early in October for  FBW  w ov w me 3 d later.    Note:  This note was prepared with assistance of Dragon voice recognition software. Occasional wrong-word or sound-a-like substitutions may have occurred due to the inherent limitations of voice recognition software.  This document serves as a record of services personally performed by [name], MD. It was created on her behalf by Georga Bora, a trained medical scribe. The creation of this record is based on the scribe's personal observations and the provider's statements to them.   I have reviewed the above medical documentation for accuracy and completeness and I concur.  [name] 08/10/18 8:27 PM   --------------------------------------------------------------------------------------------------------------------------------------------------------------------------------------------------------------------------------------------    Subjective:  HPI: Kim Lane is a 84 y.o. female who presents to [place] [facility] today for issues as discussed  below.  Muscle Spasms -Has been experiencing cramping of her legs, feet and hands -States her hands have been cramping throughout the day, but her feet and legs have been cramping at night -States she has not been drinking a lot of water  Sleep -PT states she has had some difficulty falling and staying asleep -States she doesn't have a bedtime routine and and sometimes go to sleep around 1130pm-1200am and wakes up around 8 am -Believes she has been overworking herself and the stress is causing her to sleep poorly  Cardiology -Known CAD; denies any symptoms -Managed by Dr. [place] Cancel and sees him yearly   Wt Readings from Last 3 Encounters:  08/10/18 125 lb 6.4 oz (56.9 kg)  07/07/18 125 lb 3.2 oz (56.8 kg)  05/30/18 123 lb 14.4 oz (56.2 kg)   BP Readings from Last 3 Encounters:  08/10/18 120/77  07/07/18 135/77  05/30/18 116/74   Pulse Readings from Last 3 Encounters:  08/10/18 62  07/07/18 70  05/30/18 71   BMI Readings from Last 3 Encounters:  08/10/18 21.52 kg/m  07/07/18 21.49 kg/m  05/30/18 21.27 kg/m     Patient Care Team    Relationship Specialty Notifications Start End  [name], DO PCP - General Family Medicine  07/02/17   Josue Hector, MD Consulting Physician Cardiology  07/02/17   Penni Bombard, MD Consulting Physician Neurology  07/02/17   Jarome Matin, MD Consulting Physician Dermatology  07/02/17   Rozetta Nunnery, MD Consulting Physician Otolaryngology  07/02/17   Brunetta Genera, MD Consulting Physician Hematology  07/02/17   Melina Schools, MD Consulting Physician Orthopedic Surgery  07/02/17   Josue Hector, MD Consulting Physician Cardiology  09/16/17      Patient Active Problem List   Diagnosis Date Noted  . Glucose intolerance (impaired glucose tolerance) 11/24/2017    Priority: High  . Hodgkin lymphoma ([place]) 04/26/2017    Priority: High  . Essential hypertension, benign 01/08/2009    Priority: High  . Coronary  atherosclerosis- sees Dr. [place] Cancel 01/08/2009    Priority: High  . Hypothyroidism 01/08/2009    Priority: High  . Closed compression fracture of L2 lumbar vertebra 06/24/2017    Priority: Medium  . Symptomatic anemia 04/26/2017    Priority: Medium  . HYPERCHOLESTEROLEMIA, MIXED 01/08/2009    Priority: Medium  . Osteoporosis 07/23/2017    Priority: Low  . CAD S/P percutaneous coronary angioplasty 07/02/2017    Priority: Low  . Sleep difficulties 09/11/2018  . Muscle cramps at night 08/10/2018  . Arthritis 08/10/2018  . Dehydration, mild 08/10/2018  . S/P kyphoplasty- dr Rolena Infante- ortho 04/05/2018  . Postmenopausal 04/05/2018  . Closed fracture of multiple ribs 04/05/2018  . Allergic contact dermatitis due to cosmetics 01/26/2018  . Trigger finger, acquired- R 3rd digit 01/26/2018  . Osteoarthritis of finger 01/26/2018  . Vitamin D deficiency 11/24/2017  . Osteoarthritis 09/16/2017  . History of iron deficiency anemia-  by ONC 07/23/2017  . Abnormal nuclear cardiac imaging test 06/16/2017  . Malnutrition of moderate degree 04/27/2017  . Port catheter in place 02/02/2017  . Nodular sclerosis Hodgkin lymphoma of lymph nodes of neck ([place]) 01/13/2017  . Dyspnea 09/14/2012  . Epistaxis 06/02/2011    Past Medical history, Surgical history, Family history, Social history, Allergies and Medications have been entered into the medical record,  reviewed and changed as needed.    Current Meds  Medication Sig  . aspirin 81 MG tablet Take 81 mg by mouth daily.    . carvedilol (COREG) 3.125 MG tablet TAKE 1 TABLET BY MOUTH EVERY OTHER MORNING  . Ciclopirox 8 % KIT Apply 1 application topically daily. To toe nails affected with fungal infection  . Coenzyme Q10 100 MG capsule Take 100 mg by mouth daily.   . Melatonin 2.5 MG CAPS Take 1 capsule by mouth at bedtime.  . Multiple Vitamins-Minerals (CENTRUM SILVER PO) Take 1 tablet by mouth daily.    . nitroGLYCERIN (NITROLINGUAL) 0.4 MG/SPRAY spray  Place 1 spray under the tongue every 5 (five) minutes x 3 doses as needed for chest pain (3 sprays max).  . Omega-3 Fatty Acids (FISH OIL) 1000 MG CAPS Take 1 capsule by mouth daily.   [name] Kitchen PROAIR HFA 108 (90 Base) MCG/ACT inhaler Inhale 2 puffs into the lungs every 4 (four) hours as needed for shortness of breath.   . RESTASIS 0.05 % ophthalmic emulsion Place 2 drops into both eyes 2 (two) times daily. For dry eyes  . triamcinolone cream (KENALOG) 0.1 % Apply 1 application topically 2 (two) times daily.  . Vitamin D, Ergocalciferol, (DRISDOL) 50000 units CAPS capsule Take 1 capsule (50,000 Units total) by mouth 2 (two) times a week.  . [DISCONTINUED] levothyroxine (SYNTHROID, LEVOTHROID) 88 MCG tablet Take 1 tablet (88 mcg total) by mouth daily before breakfast.    Allergies:  Allergies  Allergen Reactions  . Fosamax [Alendronate Sodium] Other (See Comments)    "made me ache all over"     Review of Systems:  A fourteen system review of systems was performed and found to be positive as per HPI.   Objective:   Blood pressure 120/77, pulse 62, height _0  (1.626 m), weight 125 lb 6.4 oz (56.9 kg), SpO2 98 %. Body mass index is 21.52 kg/m. General:  Well Developed, well nourished, appropriate for stated age.  Neuro:  Alert and oriented,  extra-ocular muscles intact  HEENT:  Normocephalic, atraumatic, neck supple, no carotid bruits appreciated  Skin:  no gross rash, warm, pink. Cardiac:  RRR, S1 S2 Respiratory:  ECTA B/L and A/P, Not using accessory muscles, speaking in full sentences- unlabored. Vascular:  Ext warm, no cyanosis apprec.; cap RF less 2 sec. Psych:  No HI/SI, judgement and insight good, Euthymic mood. Full Affect.

## 2018-08-23 ENCOUNTER — Other Ambulatory Visit: Payer: Self-pay | Admitting: Family Medicine

## 2018-09-08 NOTE — Progress Notes (Signed)
Patient ID: Kim Lane, female   DOB: [birth year], 84 y.o.   MRN: [id number]   84 y.o. f/u for CAD had high risk myovue 06/10/17 for preoperative back surgery EF 58% Ischemia in the apical and anterior regions F/u cath done by Kim Lane reviewed   Conclusion   1. Severe three-vessel coronary artery disease with:  Severe mid LAD and first diagonal stenosis with continued patency of the stented segment in the mid LAD  Severe ostial left circumflex and first OM stenoses  Severe proximal RCA and posterior AV segment stenoses  2. Normal LV function by nuclear assessment     She was allowed to have her back surgery with Kim Lane and had no cardiac complications and good pain relief  Follows with Oncology for Hodgkin's Lymphoma stable at this point   Appears to have new significant contact dermatitis over arms and torso  ROS: Denies fever, malais, weight loss, blurry vision, decreased visual acuity, cough, sputum, SOB, hemoptysis, pleuritic pain, palpitaitons, heartburn, abdominal pain, melena, lower extremity edema, claudication, or rash.  All other systems reviewed and negative  General: BP 128/72   Pulse 70   Ht _0  (1.626 m)   Wt 127 lb 8 oz (57.8 kg)   SpO2 97%   BMI 21.89 kg/m  Affect appropriate Elderly white female  HEENT: normal Neck supple with no adenopathy JVP normal no bruits no thyromegaly Lungs clear with no wheezing and good diaphragmatic motion Heart:  S1/S2 SEM  murmur, no rub, gallop or click PMI normal Abdomen: benighn, BS positve, no tenderness, no AAA post appy  no bruit.  No HSM or HJR Distal pulses intact with no bruits No edema Neuro non-focal Skin warm and dry Post bilateral TKR      Current Outpatient Medications  Medication Sig Dispense Refill  . aspirin 81 MG tablet Take 81 mg by mouth daily.      . carvedilol (COREG) 3.125 MG tablet TAKE 1 TABLET BY MOUTH EVERY OTHER MORNING 45 tablet 0  . Ciclopirox 8 % KIT Apply 1 application  topically daily. To toe nails affected with fungal infection 34.6 each 0  . Coenzyme Q10 100 MG capsule Take 100 mg by mouth daily.     [name] Kitchen levothyroxine (SYNTHROID, LEVOTHROID) 88 MCG tablet TAKE 1 TABLET BY MOUTH DAILY BEFORE BREAKFAST 90 tablet 1  . Melatonin 2.5 MG CAPS Take 1 capsule by mouth at bedtime.    . Multiple Vitamins-Minerals (CENTRUM SILVER PO) Take 1 tablet by mouth daily.      . nitroGLYCERIN (NITROLINGUAL) 0.4 MG/SPRAY spray Place 1 spray under the tongue every 5 (five) minutes x 3 doses as needed for chest pain (3 sprays max).    . Omega-3 Fatty Acids (FISH OIL) 1000 MG CAPS Take 1 capsule by mouth daily.     [name] Kitchen PROAIR HFA 108 (90 Base) MCG/ACT inhaler Inhale 2 puffs into the lungs every 4 (four) hours as needed for shortness of breath.     . RESTASIS 0.05 % ophthalmic emulsion Place 2 drops into both eyes 2 (two) times daily. For dry eyes    . triamcinolone cream (KENALOG) 0.1 % Apply 1 application topically 2 (two) times daily. 60 g 0  . Vitamin D, Ergocalciferol, (DRISDOL) 50000 units CAPS capsule Take 1 capsule (50,000 Units total) by mouth 2 (two) times a week. 24 capsule 2   No current facility-administered medications for this visit.     Allergies  Fosamax [alendronate sodium]  Electrocardiogram:  09/16/18  SR rate 83 normal   Assessment and Plan  CAD: Severe 3VD no angina Continue medical Rx and consider intervention only for unstable agnina give age and co morbidities   Thyroid  Continue replacement TSH normal labs with primary   TIA:  Non focal neuro exam for me.  Echo with bubble study negative  ECG totally normal Monitor unrevealing and patient defers LINQ impant at this time  Lymphoma:  Has had chemo and resultant anemia needing transfusion PET scan reassuring f/u oncology Kim Lane   Back:  Post kyphoplasty with good pain relief. Compression fractures ? Due to osteopenia did not tolerate fosamax in past f/u primary   Dermatitis:  Encouraged to take  benedryl and see primary about predpack     [name]

## 2018-09-09 ENCOUNTER — Other Ambulatory Visit: Payer: Self-pay | Admitting: Family Medicine

## 2018-09-09 ENCOUNTER — Other Ambulatory Visit: Payer: Self-pay | Admitting: Internal Medicine

## 2018-09-09 DIAGNOSIS — Z1231 Encounter for screening mammogram for malignant neoplasm of breast: Secondary | ICD-10-CM

## 2018-09-11 DIAGNOSIS — G479 Sleep disorder, unspecified: Secondary | ICD-10-CM | POA: Insufficient documentation

## 2018-09-16 ENCOUNTER — Encounter: Payer: Self-pay | Admitting: Cardiovascular Disease

## 2018-09-16 ENCOUNTER — Ambulatory Visit: Payer: Medicare Other | Admitting: Cardiovascular Disease

## 2018-09-16 VITALS — BP 128/72 | HR 70 | Ht 64.0 in | Wt 127.5 lb

## 2018-09-16 DIAGNOSIS — I2583 Coronary atherosclerosis due to lipid rich plaque: Secondary | ICD-10-CM | POA: Diagnosis not present

## 2018-09-16 DIAGNOSIS — I2510 Atherosclerotic heart disease of native coronary artery without angina pectoris: Secondary | ICD-10-CM | POA: Diagnosis not present

## 2018-09-16 NOTE — Patient Instructions (Signed)

## 2018-10-06 ENCOUNTER — Inpatient Hospital Stay: Payer: Medicare Other | Attending: Hematology

## 2018-10-06 ENCOUNTER — Telehealth: Payer: Self-pay

## 2018-10-06 ENCOUNTER — Inpatient Hospital Stay: Payer: Medicare Other | Admitting: Hematology

## 2018-10-06 VITALS — BP 133/67 | HR 66 | Temp 97.8°F | Resp 17 | Ht 64.0 in | Wt 131.2 lb

## 2018-10-06 DIAGNOSIS — G4762 Sleep related leg cramps: Secondary | ICD-10-CM

## 2018-10-06 DIAGNOSIS — Z87310 Personal history of (healed) osteoporosis fracture: Secondary | ICD-10-CM | POA: Insufficient documentation

## 2018-10-06 DIAGNOSIS — C8190 Hodgkin lymphoma, unspecified, unspecified site: Secondary | ICD-10-CM

## 2018-10-06 DIAGNOSIS — E559 Vitamin D deficiency, unspecified: Secondary | ICD-10-CM

## 2018-10-06 DIAGNOSIS — I2510 Atherosclerotic heart disease of native coronary artery without angina pectoris: Secondary | ICD-10-CM

## 2018-10-06 DIAGNOSIS — M810 Age-related osteoporosis without current pathological fracture: Secondary | ICD-10-CM

## 2018-10-06 DIAGNOSIS — C8118 Nodular sclerosis classical Hodgkin lymphoma, lymph nodes of multiple sites: Secondary | ICD-10-CM

## 2018-10-06 DIAGNOSIS — M8978 Major osseous defect, other site: Secondary | ICD-10-CM | POA: Diagnosis not present

## 2018-10-06 DIAGNOSIS — D509 Iron deficiency anemia, unspecified: Secondary | ICD-10-CM

## 2018-10-06 DIAGNOSIS — M8000XG Age-related osteoporosis with current pathological fracture, unspecified site, subsequent encounter for fracture with delayed healing: Secondary | ICD-10-CM

## 2018-10-06 LAB — CBC WITH DIFFERENTIAL/PLATELET
Abs Immature Granulocytes: 0.04 10*3/uL (ref 0.00–0.07)
Basophils Absolute: 0.1 10*3/uL (ref 0.0–0.1)
Basophils Relative: 1 %
Eosinophils Absolute: 0.8 10*3/uL — ABNORMAL HIGH (ref 0.0–0.5)
Eosinophils Relative: 12 %
HCT: 38.6 % (ref 36.0–46.0)
Hemoglobin: 12.4 g/dL (ref 12.0–15.0)
Immature Granulocytes: 1 %
Lymphocytes Relative: 25 %
Lymphs Abs: 1.7 10*3/uL (ref 0.7–4.0)
MCH: 29 pg (ref 26.0–34.0)
MCHC: 32.1 g/dL (ref 30.0–36.0)
MCV: 90.2 fL (ref 80.0–100.0)
Monocytes Absolute: 0.6 10*3/uL (ref 0.1–1.0)
Monocytes Relative: 9 %
Neutro Abs: 3.5 10*3/uL (ref 1.7–7.7)
Neutrophils Relative %: 52 %
Platelets: 355 10*3/uL (ref 150–400)
RBC: 4.28 MIL/uL (ref 3.87–5.11)
RDW: 14.4 % (ref 11.5–15.5)
WBC: 6.6 10*3/uL (ref 4.0–10.5)
nRBC: 0 % (ref 0.0–0.2)

## 2018-10-06 NOTE — Telephone Encounter (Signed)
Printed avs and calender of upcoming. Per 10/17 los

## 2018-10-06 NOTE — Progress Notes (Signed)
HEMATOLOGY/ONCOLOGY CLINIC NOTE  Date of Service: 10/06/18  Patient Care Team: [name], DO as PCP - General (Family Medicine) Josue Hector, MD as PCP - Cardiology (Cardiology) Josue Hector, MD as Consulting Physician (Cardiology) Penni Bombard, MD as Consulting Physician (Neurology) Jarome Matin, MD as Consulting Physician (Dermatology) Rozetta Nunnery, MD as Consulting Physician (Otolaryngology) Brunetta Genera, MD as Consulting Physician (Hematology) Melina Schools, MD as Consulting Physician (Orthopedic Surgery) Josue Hector, MD as Consulting Physician (Cardiology)  CHIEF COMPLAINTS/PURPOSE OF CONSULTATION:   f/u for continued treatment of Hodgkin's lymphoma  HISTORY OF PRESENTING ILLNESS:   plz see previous   INTERVAL HISTORY   Kim Lane is here for scheduled follow up for her Hodgkins lymphoma. The patient's last visit with [place] was on 07/07/18. The pt reports that she is doing well overall.   The pt reports that she has had some trouble sleeping at night because she has been having leg cramps at night. The pt notes that she has been staying well hydrated and continues on Vitamin D replacement. She also notes that she will pursue further work up with her PCP next week regarding this.   The pt adds that she had some itchy skin rashes and has used benadryl and Triamcinolone successfully. She notes that she has gained weight and has been eating very well. She has been keeping active, and continues with her gardening and yard work. She denies any constitutional symptoms at this time.   Lab results today (10/06/18) of CBC w/diff is as follows: all values are WNL except for Eosinophils abs at 800. 10/06/18 Vitamin D is 80. Sed rate is wnl at 6.   On review of systems, pt reports eating well, weight gain, staying active, leg cramps at night, not sleeping well, recent allergic skin reactions, and denies fevers, chills, night sweats,  unexpected weight loss, noticing any new lumps or bumps, leg swelling, calf pain, abdominal pains, and any other symptoms.    MEDICAL HISTORY:  Past Medical History:  Diagnosis Date  . Anemia   . Anginal pain ([place])    occ  . Blood dyscrasia 12/2016   hodgins lymphoma tx-remission  . CAD (coronary artery disease) 02/2006   Taxus stent placed to LAD and Diagonal per Dr. Olevia Perches  . Cataracts, bilateral   . Complication of anesthesia   . HTN (hypertension)   . Hypercholesterolemia   . Hypothyroidism   . IBS (irritable bowel syndrome)   . Osteoarthritis   . PONV (postoperative nausea and vomiting)   . Stroke [facility]) 03/2016  Osteoporosis Cardiologist Dr. [name] [facility] -Dr. Earlean Shawl  SURGICAL HISTORY: Past Surgical History:  Procedure Laterality Date  . APPENDECTOMY    . IR GENERIC HISTORICAL  01/25/2017   IR US GUIDE VASC ACCESS RIGHT 01/25/2017 [name], MD [facility]  . IR GENERIC HISTORICAL  01/25/2017   IR FLUORO GUIDE PORT INSERTION RIGHT 01/25/2017 [name], MD [facility]  . IR REMOVAL TUN ACCESS W/ PORT W/O FL MOD SED  10/28/2017  . KYPHOPLASTY N/A 06/24/2017   Procedure: KYPHOPLASTY T10;  Surgeon: Melina Schools, MD;  Location: [place];  Service: Orthopedics;  Laterality: N/A;  90 mins  . LEFT HEART CATH AND CORONARY ANGIOGRAPHY N/A 06/16/2017   Procedure: Left Heart Cath and Coronary Angiography;  Surgeon: Sherren Mocha, MD;  Location: [place] CV LAB;  Service: Cardiovascular;  Laterality: N/A;  . MASS EXCISION Right 12/22/2016   Procedure: RIGHT NECK LYMPH  NODE BIOPSY;  Surgeon: Rozetta Nunnery, MD;  Location: [place];  Service: ENT;  Laterality: Right;  . SPINE SURGERY    . stents     in heart  . TONSILLECTOMY    . TOTAL KNEE ARTHROPLASTY Bilateral 2011,2012  . VAGINAL HYSTERECTOMY      SOCIAL HISTORY: Social History   Socioeconomic History  . Marital status: Widowed    Spouse name: Not on file  . Number of children: 2  . Years  of education: 61  . 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 Financial resource strain: Not on file  . Food insecurity:    Worry: Not on file    Inability: Not on file  . Transportation needs:    Medical: Not on file    Non-medical: Not on file  Tobacco Use  . Smoking status: Former Research scientist (life sciences)  . Smokeless tobacco: Never Used  . Tobacco comment: quit smoking 30 years ago, very light smoker  Substance and Sexual Activity  . Alcohol use: No    Alcohol/week: 0.0 standard drinks  . Drug use: No  . Sexual activity: Not on file  Lifestyle  . Physical activity:    Days per week: Not on file    Minutes per session: Not on file  . Stress: Not on file  Relationships  . Social connections:    Talks on phone: Not on file    Gets together: Not on file    Attends religious service: Not on file    Active member of club or organization: Not on file    Attends meetings of clubs or organizations: Not on file    Relationship status: Not on file  . Intimate partner violence:    Fear of current or ex partner: Not on file    Emotionally abused: Not on file    Physically abused: Not on file    Forced sexual activity: Not on file  Other Topics Concern  . Not on file  Social History Narrative   Lives alone   caffeine use- coffee- 5 cups daily    FAMILY HISTORY: Family History  Problem Relation Age of Onset  . Diabetes Mother   . Dementia Mother   . ALS Mother   . Heart Problems Father   . Liver disease Sister   . Cirrhosis Sister   . Heart block Unknown        CABG  . Diabetes Sister   . Heart block Son        CABG  . Other [place]  . Other Daughter        [place]  . Dementia Sister     ALLERGIES:  is allergic to fosamax [alendronate sodium].  MEDICATIONS:  Current Outpatient Medications  Medication Sig Dispense Refill  . aspirin 81 MG tablet Take 81 mg by  mouth daily.      . carvedilol (COREG) 3.125 MG tablet TAKE 1 TABLET BY MOUTH EVERY OTHER MORNING 45 tablet 0  . Ciclopirox 8 % KIT Apply 1 application topically daily. To toe nails affected with fungal infection 34.6 each 0  . Coenzyme Q10 100 MG capsule Take 100 mg by mouth daily.     [name] Kitchen levothyroxine (SYNTHROID, LEVOTHROID) 88 MCG tablet TAKE 1 TABLET BY MOUTH DAILY BEFORE BREAKFAST 90 tablet 1  . Melatonin  2.5 MG CAPS Take 1 capsule by mouth at bedtime.    . Multiple Vitamins-Minerals (CENTRUM SILVER PO) Take 1 tablet by mouth daily.      . nitroGLYCERIN (NITROLINGUAL) 0.4 MG/SPRAY spray Place 1 spray under the tongue every 5 (five) minutes x 3 doses as needed for chest pain (3 sprays max).    . Omega-3 Fatty Acids (FISH OIL) 1000 MG CAPS Take 1 capsule by mouth daily.     [name] Kitchen PROAIR HFA 108 (90 Base) MCG/ACT inhaler Inhale 2 puffs into the lungs every 4 (four) hours as needed for shortness of breath.     . RESTASIS 0.05 % ophthalmic emulsion Place 2 drops into both eyes 2 (two) times daily. For dry eyes    . triamcinolone cream (KENALOG) 0.1 % Apply 1 application topically 2 (two) times daily. 60 g 0  . Vitamin D, Ergocalciferol, (DRISDOL) 50000 units CAPS capsule Take 1 capsule (50,000 Units total) by mouth 2 (two) times a week. 24 capsule 2   No current facility-administered medications for this visit.     REVIEW OF SYSTEMS:   A 10+ POINT REVIEW OF SYSTEMS WAS OBTAINED including neurology, dermatology, psychiatry, cardiac, respiratory, lymph, extremities, GI, GU, Musculoskeletal, constitutional, breasts, reproductive, HEENT.  All pertinent positives are noted in the HPI.  All others are negative.   PHYSICAL EXAMINATION:  ECOG PERFORMANCE STATUS: 2 - Symptomatic, <50% confined to bed  Vitals:   10/06/18 0923  BP: 133/67  Pulse: 66  Resp: 17  Temp: 97.8 F (36.6 C)  TempSrc: Oral  SpO2: 99%  Weight: 131 lb 3.2 oz (59.5 kg)  Height: '5\' 4"'$  (1.626 m)  .  GENERAL:alert, in no acute  distress and comfortable SKIN: no acute rashes, no significant lesions EYES: conjunctiva are pink and non-injected, sclera anicteric OROPHARYNX: MMM, no exudates, no oropharyngeal erythema or ulceration NECK: supple, no JVD LYMPH:  no palpable lymphadenopathy in the cervical, axillary or inguinal regions LUNGS: clear to auscultation b/l with normal respiratory effort HEART: regular rate & rhythm ABDOMEN:  normoactive bowel sounds , non tender, not distended. No palpable hepatosplenomegaly.  Extremity: no pedal edema PSYCH: alert & oriented x 3 with fluent speech NEURO: no focal motor/sensory deficits   LABORATORY DATA:  I have reviewed the data as listed   . CBC Latest Ref Rng & Units 10/07/2018 10/06/2018 07/07/2018  WBC 3.4 - 10.8 x10E3/uL 6.3 6.6 7.4  Hemoglobin 11.1 - 15.9 g/dL 12.5 12.4 12.5  Hematocrit 34.0 - 46.6 % 38.9 38.6 38.2  Platelets 150 - 450 x10E3/uL 439 355 323   . CMP Latest Ref Rng & Units 10/07/2018 07/07/2018 04/19/2018  Glucose 65 - 99 mg/dL 105(H) 103(H) 115  BUN 8 - 27 mg/dL '13 21 19  '$ Creatinine 0.57 - 1.00 mg/dL 0.96 1.00 0.98  Sodium 134 - 144 mmol/L 142 140 139  Potassium 3.5 - 5.2 mmol/L 4.5 4.5 4.7  Chloride 96 - 106 mmol/L 102 104 103  CO2 20 - 29 mmol/L '27 28 29  '$ Calcium 8.7 - 10.3 mg/dL 9.4 9.4 9.7  Total Protein 6.0 - 8.5 g/dL 6.8 - 7.3  Total Bilirubin 0.0 - 1.2 mg/dL 0.4 - 0.5  Alkaline Phos 39 - 117 IU/L 51 - 82  AST 0 - 40 IU/L 23 - 24  ALT 0 - 32 IU/L 19 - 16   Erythrocyte Sedimentation Rate     Component Value Date/Time   ESRSEDRATE 6 10/07/2018 0833   ESRSEDRATE 20 10/07/2017 1115    RADIOGRAPHIC  STUDIES: I have personally reviewed the radiological images as listed and agreed with the findings in the report. No results found.   ASSESSMENT & PLAN:   84 y.o. Caucasian female with ECOG performance status of 2 with  1) Classical Hodgkin's lymphoma of nodular sclerosis variety At least Stage IIIB some concern for possible Rt lung  involvement which makes it Stage IVB Presented with right neck lymph nodes . She had type B constitutional symptoms with 15-20 pound weight loss night sweats or chills .noted to have significant pruritus with mild rash likely from Hodgkin's which remain resolved  Wt Readings from Last 3 Encounters:  10/06/18 131 lb 3.2 oz (59.5 kg)  09/16/18 127 lb 8 oz (57.8 kg)  08/10/18 125 lb 6.4 oz (56.9 kg)  PET/CT scan after 5 cycles shows marked response to chemotherapy and stable predominantly [place] 2 as per discussion with the radiology.  2) Microcytic anemia - likely anemia of chronic disease due to Hodgkin's lymphoma. anemia resolved, Hg stable   PLAN:  -reviewed labs on 10/17 and 10/18. Cbc. cmp WNL and sed rate WNL at 6. -Recommended slow magnesium for night time leg cramps -Continue high dose Vitamin D replacement -Recommended that the pt continue to eat well, drink at least 48-64 oz of water each day, and walk 20-30 minutes each day.   -The pt shows no clinical or lab progression of her Hodgkin's lymphoma at this time.  -No indication for further treatment at this time.   -No residual toxicities  -Will see the pt back in 3 months    3) T10 compression fracture based on imaging done on 05/22/2017 in ED Now status post kyphoplasty with significant improvement in her back pain  4) Osteoporosis PET/CT scan also shows possible L4 compression fracture . These compression fractures appear to be related to osteoporosis . Also had rib fracture related to no significant trauma She was previously on Fosamax that cause generalized body aches .  PLAN:  -I explained she should notify us if she is thinking of doing invasive dental procedures while on Prolia.  -She is to continue ergocalciferol 50k twice weekly to target a 25OH vit D level of close to 50-60 - levels adequate can reduce dose to once weekly -Continue with Prolia injection every 6 months  5) h/o triple-vessel coronary artery disease  based on cardiac cath: patient is surprisingly asymptomatic. -Continue management as per PCP and cardiology.  6) . Patient Active Problem List   Diagnosis Date Noted  . Sleep difficulties 09/11/2018  . Muscle cramps at night 08/10/2018  . Arthritis 08/10/2018  . Dehydration, mild 08/10/2018  . S/P kyphoplasty- dr Rolena Infante- ortho 04/05/2018  . Postmenopausal 04/05/2018  . Closed fracture of multiple ribs 04/05/2018  . Allergic contact dermatitis due to cosmetics 01/26/2018  . Trigger finger, acquired- R 3rd digit 01/26/2018  . Osteoarthritis of finger 01/26/2018  . Vitamin D deficiency 11/24/2017  . Glucose intolerance (impaired glucose tolerance) 11/24/2017  . Osteoarthritis 09/16/2017  . History of iron deficiency anemia-  by ONC 07/23/2017  . Osteoporosis 07/23/2017  . CAD S/P percutaneous coronary angioplasty 07/02/2017  . Closed compression fracture of L2 lumbar vertebra 06/24/2017  . Abnormal nuclear cardiac imaging test 06/16/2017  . Malnutrition of moderate degree 04/27/2017  . Symptomatic anemia 04/26/2017  . Hodgkin lymphoma ([place]) 04/26/2017  . Port catheter in place 02/02/2017  . Nodular sclerosis Hodgkin lymphoma of lymph nodes of neck ([place]) 01/13/2017  . Dyspnea 09/14/2012  . Epistaxis 06/02/2011  .  HYPERCHOLESTEROLEMIA, MIXED 01/08/2009  . Essential hypertension, benign 01/08/2009  . Coronary atherosclerosis- sees Dr. Johnsie Cancel 01/08/2009  . Hypothyroidism 01/08/2009  PLAN  -Continue follow-up with primary care physician for management of other chronic medical co-morbidities.   RTC with Dr [name] with labs in 3 months Continue Prolia q20month  All of the patients questions were answered with apparent satisfaction. The patient knows to call the clinic with any problems, questions or concerns.   The total time spent in the appt was 20 minutes and more than 50% was on counseling and direct patient cares.      GSullivan LoneMD MS AAHIVMS SNovamed Surgery Center Of Madison LPCPeacehealth Southwest Medical CenterHematology/Oncology  Physician CNorcap Lodge (Office):       [phone](Work cell):  [phone](Fax):           [phone] I, SBaldwin Jamaica am acting as a scribe for Dr. KIrene Limbo .I have reviewed the above documentation for accuracy and completeness, and I agree with the above. .Brunetta GeneraMD

## 2018-10-07 ENCOUNTER — Other Ambulatory Visit: Payer: Medicare Other

## 2018-10-07 DIAGNOSIS — E7800 Pure hypercholesterolemia, unspecified: Secondary | ICD-10-CM

## 2018-10-07 DIAGNOSIS — R7302 Impaired glucose tolerance (oral): Secondary | ICD-10-CM

## 2018-10-07 DIAGNOSIS — C8190 Hodgkin lymphoma, unspecified, unspecified site: Secondary | ICD-10-CM

## 2018-10-07 DIAGNOSIS — E559 Vitamin D deficiency, unspecified: Secondary | ICD-10-CM

## 2018-10-07 DIAGNOSIS — E039 Hypothyroidism, unspecified: Secondary | ICD-10-CM

## 2018-10-07 DIAGNOSIS — I10 Essential (primary) hypertension: Secondary | ICD-10-CM

## 2018-10-07 DIAGNOSIS — R252 Cramp and spasm: Secondary | ICD-10-CM

## 2018-10-07 DIAGNOSIS — Z23 Encounter for immunization: Secondary | ICD-10-CM

## 2018-10-07 DIAGNOSIS — E860 Dehydration: Secondary | ICD-10-CM

## 2018-10-07 LAB — VITAMIN D 25 HYDROXY (VIT D DEFICIENCY, FRACTURES): Vit D, 25-Hydroxy: 89.8 ng/mL (ref 30.0–100.0)

## 2018-10-08 ENCOUNTER — Other Ambulatory Visit: Payer: Self-pay | Admitting: Family Medicine

## 2018-10-08 DIAGNOSIS — I10 Essential (primary) hypertension: Secondary | ICD-10-CM

## 2018-10-08 LAB — CBC WITH DIFFERENTIAL/PLATELET
Basophils Absolute: 0.1 10*3/uL (ref 0.0–0.2)
Basos: 1 %
EOS (ABSOLUTE): 0.8 10*3/uL — ABNORMAL HIGH (ref 0.0–0.4)
Eos: 13 %
Hematocrit: 38.9 % (ref 34.0–46.6)
Hemoglobin: 12.5 g/dL (ref 11.1–15.9)
Immature Grans (Abs): 0 10*3/uL (ref 0.0–0.1)
Immature Granulocytes: 1 %
Lymphocytes Absolute: 1.8 10*3/uL (ref 0.7–3.1)
Lymphs: 29 %
MCH: 28.3 pg (ref 26.6–33.0)
MCHC: 32.1 g/dL (ref 31.5–35.7)
MCV: 88 fL (ref 79–97)
Monocytes Absolute: 0.5 10*3/uL (ref 0.1–0.9)
Monocytes: 8 %
Neutrophils Absolute: 3.1 10*3/uL (ref 1.4–7.0)
Neutrophils: 48 %
Platelets: 439 10*3/uL (ref 150–450)
RBC: 4.41 x10E6/uL (ref 3.77–5.28)
RDW: 13.1 % (ref 12.3–15.4)
WBC: 6.3 10*3/uL (ref 3.4–10.8)

## 2018-10-08 LAB — COMPREHENSIVE METABOLIC PANEL
ALT: 19 IU/L (ref 0–32)
AST: 23 IU/L (ref 0–40)
Albumin/Globulin Ratio: 1.7 (ref 1.2–2.2)
Albumin: 4.3 g/dL (ref 3.5–4.7)
Alkaline Phosphatase: 51 IU/L (ref 39–117)
BUN/Creatinine Ratio: 14 (ref 12–28)
BUN: 13 mg/dL (ref 8–27)
Bilirubin Total: 0.4 mg/dL (ref 0.0–1.2)
CO2: 27 mmol/L (ref 20–29)
Calcium: 9.4 mg/dL (ref 8.7–10.3)
Chloride: 102 mmol/L (ref 96–106)
Creatinine, Ser: 0.96 mg/dL (ref 0.57–1.00)
GFR calc Af Amer: 64 mL/min/{1.73_m2} (ref >59)
GFR calc non Af Amer: 55 mL/min/{1.73_m2} — ABNORMAL LOW (ref >59)
Globulin, Total: 2.5 g/dL (ref 1.5–4.5)
Glucose: 105 mg/dL — ABNORMAL HIGH (ref 65–99)
Potassium: 4.5 mmol/L (ref 3.5–5.2)
Sodium: 142 mmol/L (ref 134–144)
Total Protein: 6.8 g/dL (ref 6.0–8.5)

## 2018-10-08 LAB — SEDIMENTATION RATE: Sed Rate: 6 mm/hr (ref 0–40)

## 2018-10-08 LAB — HEMOGLOBIN A1C
Est. average glucose Bld gHb Est-mCnc: 126 mg/dL
Hgb A1c MFr Bld: 6 % — ABNORMAL HIGH (ref 4.8–5.6)

## 2018-10-08 LAB — LIPID PANEL
Chol/HDL Ratio: 3.6 ratio (ref 0.0–4.4)
Cholesterol, Total: 239 mg/dL — ABNORMAL HIGH (ref 100–199)
HDL: 67 mg/dL (ref >39)
LDL Calculated: 151 mg/dL — ABNORMAL HIGH (ref 0–99)
Triglycerides: 106 mg/dL (ref 0–149)
VLDL Cholesterol Cal: 21 mg/dL (ref 5–40)

## 2018-10-08 LAB — TSH: TSH: 1.24 u[IU]/mL (ref 0.450–4.500)

## 2018-10-08 LAB — VITAMIN B12: Vitamin B-12: 601 pg/mL (ref 232–1245)

## 2018-10-08 LAB — VITAMIN D 25 HYDROXY (VIT D DEFICIENCY, FRACTURES): Vit D, 25-Hydroxy: 80.2 ng/mL (ref 30.0–100.0)

## 2018-10-10 ENCOUNTER — Ambulatory Visit: Payer: Medicare Other | Admitting: Family Medicine

## 2018-10-10 ENCOUNTER — Encounter: Payer: Self-pay | Admitting: Family Medicine

## 2018-10-10 ENCOUNTER — Telehealth: Payer: Self-pay | Admitting: Family Medicine

## 2018-10-10 VITALS — BP 115/77 | HR 70 | Ht 64.0 in | Wt 129.9 lb

## 2018-10-10 DIAGNOSIS — I2510 Atherosclerotic heart disease of native coronary artery without angina pectoris: Secondary | ICD-10-CM

## 2018-10-10 DIAGNOSIS — E7800 Pure hypercholesterolemia, unspecified: Secondary | ICD-10-CM | POA: Diagnosis not present

## 2018-10-10 DIAGNOSIS — M810 Age-related osteoporosis without current pathological fracture: Secondary | ICD-10-CM

## 2018-10-10 DIAGNOSIS — D649 Anemia, unspecified: Secondary | ICD-10-CM

## 2018-10-10 DIAGNOSIS — R7302 Impaired glucose tolerance (oral): Secondary | ICD-10-CM

## 2018-10-10 DIAGNOSIS — Z23 Encounter for immunization: Secondary | ICD-10-CM

## 2018-10-10 DIAGNOSIS — I10 Essential (primary) hypertension: Secondary | ICD-10-CM

## 2018-10-10 DIAGNOSIS — Z9861 Coronary angioplasty status: Secondary | ICD-10-CM

## 2018-10-10 DIAGNOSIS — E038 Other specified hypothyroidism: Secondary | ICD-10-CM

## 2018-10-10 MED ORDER — TETANUS-DIPHTH-ACELL PERTUSSIS 5-2.5-18.5 LF-MCG/0.5 IM SUSP: 0.5000 mL | Freq: Once | INTRAMUSCULAR | 0 refills | Status: DC

## 2018-10-10 MED ORDER — TETANUS-DIPHTH-ACELL PERTUSSIS 5-2.5-18.5 LF-MCG/0.5 IM SUSP: 0.5000 mL | Freq: Once | INTRAMUSCULAR | 0 refills | Status: AC

## 2018-10-10 NOTE — Patient Instructions (Addendum)
** Melissa, please send her last labs  ( chol) to her Cardiologist. [facility]!!     -you can take up to 10mg  of melatonin per night -you should also consider long acting melatonin to help    Guidelines for a Low Cholesterol, Low Saturated Fat Diet   Fats - Limit total intake of fats and oils. - Avoid butter, stick margarine, shortening, lard, palm and coconut oils. - Limit mayonnaise, salad dressings, gravies and sauces, unless they are homemade with low-fat ingredients. - Limit chocolate. - Choose low-fat and nonfat products, such as low-fat mayonnaise, low-fat or non-hydrogenated peanut butter, low-fat or fat-free salad dressings and nonfat gravy. - Use vegetable oil, such as canola or olive oil. - Look for margarine that does not contain trans fatty acids. - Use nuts in moderate amounts. - Read ingredient labels carefully to determine both amount and type of fat present in foods. Limit saturated and trans fats! - Avoid high-fat processed and convenience foods.  Meats and Meat Alternatives - Choose fish, chicken, [place] and lean meats. - Use dried beans, peas, lentils and tofu. - Limit egg yolks to three to four per week. - If you eat red meat, limit to no more than three servings per week and choose loin or round cuts. - Avoid fatty meats, such as bacon, sausage, franks, luncheon meats and ribs. - Avoid all organ meats, including liver.  Dairy - Choose nonfat or low-fat milk, yogurt and cottage cheese. - Most cheeses are high in fat. Choose cheeses made from non-fat milk, such as mozzarella and ricotta cheese. - Choose light or fat-free cream cheese and sour cream. - Avoid cream and sauces made with cream.  Fruits and Vegetables - Eat a wide variety of fruits and vegetables. - Use lemon juice, vinegar or "mist" olive oil on vegetables. - Avoid adding sauces, fat or oil to vegetables.  Breads, Cereals and Grains - Choose whole-grain breads, cereals, pastas and rice. - Avoid  high-fat snack foods, such as granola, cookies, pies, pastries, doughnuts and croissants.  Cooking Tips - Avoid deep fried foods. - Trim visible fat off meats and remove skin from poultry before cooking. - Bake, broil, boil, poach or roast poultry, fish and lean meats. - Drain and discard fat that drains out of meat as you cook it. - Add little or no fat to foods. - Use vegetable oil sprays to grease pans for cooking or baking. - Steam vegetables. - Use herbs or no-oil marinades to flavor foods.     How to Increase Your Level of Physical Activity  Getting regular physical activity is important for your overall health and well-being. Most people do not get enough exercise. There are easy ways to increase your level of physical activity, even if you have not been very active in the past or you are just starting out. Why is physical activity important? Physical activity has many short-term and long-term health benefits. Regular exercise can:  Help you lose weight or maintain a healthy weight.  Strengthen your muscles and bones.  Boost your mood and improve self-esteem.  Reduce your risk of certain long-term (chronic) diseases, like heart disease, cancer, and diabetes.  Help you stay capable of walking and moving around (mobile) as you age.  Prevent accidents, such as falls, as you age.  Increase life expectancy.  What are the benefits of being physically active on a regular basis? In addition to improving your physical health, being physically active on most days of the week can  help you in ways that you may not expect. Benefits of regular physical activity may include:  Feeling good about your body.  Being able to move around more easily and for longer periods of time without getting tired (increased stamina).  Finding new sources of fun and enjoyment.  Meeting new people who share a common interest.  Being able to fight off illness better (enhanced immunity).  Being  able to sleep better.  What can happen if I am not physically active on a regular basis? Not getting enough physical activity can lead to an unhealthy lifestyle and future health problems. This can increase your chances of:  Becoming overweight or obese.  Becoming sick.  Developing chronic illnesses, like heart disease or diabetes.  Having mental health problems, like depression or anxiety.  Having sleep problems.  Having trouble walking or getting yourself around (reduced mobility).  Injuring yourself in a fall as you get older.  What steps can I take to be more physically active?  Check with your health care provider about how to get started. Ask your health care provider what activities are safe for you.  Start out slowly. Walking or doing some simple chair exercises is a good place to start, especially if you have not been active before or for a long time.  Try to find activities that you enjoy. You are more likely to commit to an exercise routine if it does not feel like a chore.  If you have bone or joint problems, choose low-impact exercises, like walking or swimming.  Include physical activity in your everyday routine.  Invite friends or family members to exercise with you. This also will help you commit to your workout plan.  Set goals that you can work toward.  Aim for at least 150 minutes of moderate-intensity exercise each week. Examples of moderate-intensity exercise include walking or riding a bike. Where to find more information:  Centers for Disease Control and Prevention: [employer]lingG[employer].is  [employer] [facility] [place] www.http://villegas.org/  [employer]: [employer].dk Contact a health care provider if:  You have headaches, muscle aches, or joint pain.  You feel dizzy or light-headed while exercising.  You faint.  You have chest pain while  exercising. Summary  Exercise benefits your mind and body at any age, even if you are just starting out.  If you have a chronic illness or have not been active for a while, check with your health care provider before increasing your physical activity.  Choose activities that are safe and enjoyable for you.Ask your health care provider what activities are safe for you.  Start slowly. Tell your health care provider if you have problems as you start to increase your activity level. This information is not intended to replace advice given to you by your health care provider. Make sure you discuss any questions you have with your health care provider. Document Released: 11/26/2016 Document Revised: 11/26/2016 Document Reviewed: 11/26/2016 Elsevier Interactive Patient Education  [employer].

## 2018-10-10 NOTE — Telephone Encounter (Signed)
Cheryl from [facility] says they do no stock Tdap vaccine (not enough demand to keep on supply)-- Advised pt get elsewhere. --forwarding to medical asst.  -glh

## 2018-10-10 NOTE — Progress Notes (Signed)
Assessment and plan:  1. Atherosclerosis of native coronary artery of native heart without angina pectoris   2. CAD S/P percutaneous coronary angioplasty   3. Essential hypertension, benign   4. HYPERCHOLESTEROLEMIA, MIXED   5. Glucose intolerance (impaired glucose tolerance)   6. Other specified hypothyroidism   7. Symptomatic anemia   8. Osteoporosis, unspecified osteoporosis type, unspecified pathological fracture presence   9. Need for Tdap vaccination    HTN -stable; continue meds -Pt states she gets carvedilol from cardiology -Encouraged pt to continue taking carvedilol as directed -instructed pt to discuss her HTN management with cardiolog  HLD -encouraged pt to speak to Dr. Mariah Milling in cardiology  -will defer to cardiology for cholesterol management -Discussed cholesterol results -Explained that LDL is the biggest indicator of cardiovascular health -Explained that HDL is good cholesterol and discussed healthy ranges -Explained that exercise is a great way to manage cholesterol levels -discussed triglyceride levels and how to positively impact them -Explained that diet can help control cholesterol levels as well  Prediabetes -Educated pt about goal A1C levels -Discussed A1C results with pt -Educated pt about how diet and exercise impact diabetes progression  -Explained prediabetic and diabetic A1C levels  Hypothyroidism -Stable; continue meds -Discussed TSH levels -Explained function of TSH, T4 and T3 in thyroid function  Anemia -Educated about CBC panel -Explained that results show her anemia is asymptomatic at this time  Lab Results -Discussed vitamin B12 results and its impact on health and energy levels -discussed vitamin D and its impact on energy levels -Discussed ser/creatinine, GFR and BUN levels and their indications of kidney health -Discussed WBC, RBC and premature blood cell  levels  Leg Cramps -Educated pt that she is not drinking enough water, which may be causing the cramps -Recommended pt to increase their water intake to half of their body weight in ounces. -Explained that increase blood flow helps alleviate cramps -Showed pt stretches to help with stretching the muscles in the back of the leg  Rash -Explained that it is likely an allergy to a new food or medication -explained that without evaluation it cannot be diagnosed or treated -Encouraged pt to go to her dermatologist the next time it flares up instead of taking the benadryl -Explained that it can also be due to stress or lack of sleep  Exercise -Instructed pt to walk at least 3 times per week for 30 min per day at her own pace encouraged to work towards walking every day -Explained that exercise helps with memory, joint pain, overall health, and mood -Discussed AHA guidelines for at least 135mn of cardiovascular exercise each week -Explained that weight bearing exercise can help decrease osteoporosis as well    Education and routine counseling performed. Handouts provided.   Medications Discontinued During This Encounter  Medication Reason  . Tdap (BOOSTRIX) 5-2.5-18.5 LF-MCG/0.5 injection Entry Error      Return for 4-661mo/up.   Anticipatory guidance and routine counseling done re: condition, txmnt options and need for follow up. All questions of patient's were answered.   Gross side effects, risk and benefits, and alternatives of medications discussed with patient.  Patient is aware that all medications have potential side effects and we are unable to predict every sideeffect or drug-drug interaction that may occur.  Expresses verbal understanding and consents to current therapy plan and treatment regiment.  Please see AVS handed out to patient at the end of our visit for additional patient instructions/ counseling done  pertaining to today's office visit.  Note:  This document was  prepared using Dragon voice recognition software and may include unintentional dictation errors.    This document serves as a record of services personally performed by [name], MD. It was created on her behalf by Georga Bora, a trained medical scribe. The creation of this record is based on the scribe's personal observations and the provider's statements to them.   I have reviewed the above medical documentation for accuracy and completeness and I concur.  [name] 10/10/18 4:10 PM   ----------------------------------------------------------------------------------------------------------------------  Subjective:   CC:   Kim Lane is a 84 y.o. female who presents to [facility] [facility] today for review and discussion of recent bloodwork that was done in addition to f/up on chronic conditions we are managing for pt.  All recent blood work that we ordered was reviewed with patient today.  Patient was counseled on all abnormalities and we discussed dietary and lifestyle changes that could help those values (also medications when appropriate).  Extensive health counseling performed and all patient's concerns/ questions were addressed.  See labs below and also plan for more details of these abnormalities  Stress/sleep -Pt's daughter was recently diagnosed with cancer at 25 -says it really "tore [her] up" and she's been struggling to deal with it but she is doing better now -says she was just shocked that her daughter would have it so long -has been taking melatonin sporadically  Lab Results 10/07/2018 Thyroid TSH 1.240  Vitamin B12 601  Vitamin D 80.2  Lipid LDL 151, down from 170 in 08/2017 HDL 67 Triglycerides 106  DM HPI: -A1C was 6.0 in last labwork -  She  Has been working on diet and exercise for diabetes Denies polyuria/polydipsia. Denies hypo/ hyperglycemia symptoms  Last A1C in the office was:  Lab Results  Component Value  Date   HGBA1C 6.0 (H) 10/07/2018   HGBA1C 5.8 (A) 08/10/2018   HGBA1C 6.1 04/05/2018    Lab Results  Component Value Date   LDLCALC 151 (H) 10/07/2018   CREATININE 0.96 10/07/2018   Leg Cramps -States she's been having lower leg cramps at night before bed -Says she's been having to get up and walk around to get them to go away -Has been drinking roughly 48oz of water each day  Rash -Says she had a rash at night one day and it went up her arms to her waist -Pt took benadryl which helped it go away -States it has happened 3 times and each time the benadryl worked  [employer] from Last 3 Encounters:  10/10/18 129 lb 14.4 oz (58.9 kg)  10/06/18 131 lb 3.2 oz (59.5 kg)  09/16/18 127 lb 8 oz (57.8 kg)   BP Readings from Last 3 Encounters:  10/10/18 115/77  10/06/18 133/67  09/16/18 128/72   Pulse Readings from Last 3 Encounters:  10/10/18 70  10/06/18 66  09/16/18 70   BMI Readings from Last 3 Encounters:  10/10/18 22.30 kg/m  10/06/18 22.52 kg/m  09/16/18 21.89 kg/m     Patient Care Team    Relationship Specialty Notifications Start End  [name], DO PCP - General Family Medicine  07/02/17   Josue Hector, MD PCP - Cardiology Cardiology Admissions 09/16/18   Josue Hector, MD Consulting Physician Cardiology  07/02/17   Penni Bombard, MD Consulting Physician Neurology  07/02/17   Jarome Matin, MD Consulting Physician Dermatology  07/02/17  Rozetta Nunnery, MD Consulting Physician Otolaryngology  07/02/17   Brunetta Genera, MD Consulting Physician Hematology  07/02/17   Melina Schools, MD Consulting Physician Orthopedic Surgery  07/02/17   Josue Hector, MD Consulting Physician Cardiology  09/16/17     Full medical history updated and reviewed in the office today  Patient Active Problem List   Diagnosis Date Noted  . Glucose intolerance (impaired glucose tolerance) 11/24/2017    Priority: High  . Hodgkin lymphoma ([place]) 04/26/2017     Priority: High  . Essential hypertension, benign 01/08/2009    Priority: High  . Coronary atherosclerosis- sees Dr. Johnsie Cancel 01/08/2009    Priority: High  . Hypothyroidism 01/08/2009    Priority: High  . Closed compression fracture of L2 lumbar vertebra 06/24/2017    Priority: Medium  . Symptomatic anemia 04/26/2017    Priority: Medium  . HYPERCHOLESTEROLEMIA, MIXED 01/08/2009    Priority: Medium  . Osteoporosis 07/23/2017    Priority: Low  . CAD S/P percutaneous coronary angioplasty 07/02/2017    Priority: Low  . Sleep difficulties 09/11/2018  . Muscle cramps at night 08/10/2018  . Arthritis 08/10/2018  . Dehydration, mild 08/10/2018  . S/P kyphoplasty- dr Rolena Infante- ortho 04/05/2018  . Postmenopausal 04/05/2018  . Closed fracture of multiple ribs 04/05/2018  . Allergic contact dermatitis due to cosmetics 01/26/2018  . Trigger finger, acquired- R 3rd digit 01/26/2018  . Osteoarthritis of finger 01/26/2018  . Vitamin D deficiency 11/24/2017  . Osteoarthritis 09/16/2017  . History of iron deficiency anemia-  by ONC 07/23/2017  . Abnormal nuclear cardiac imaging test 06/16/2017  . Malnutrition of moderate degree 04/27/2017  . Port catheter in place 02/02/2017  . Nodular sclerosis Hodgkin lymphoma of lymph nodes of neck ([place]) 01/13/2017  . Dyspnea 09/14/2012  . Epistaxis 06/02/2011    Past Medical History:  Diagnosis Date  . Anemia   . Anginal pain ([place])    occ  . Blood dyscrasia 12/2016   hodgins lymphoma tx-remission  . CAD (coronary artery disease) 02/2006   Taxus stent placed to LAD and Diagonal per Dr. Olevia Perches  . Cataracts, bilateral   . Complication of anesthesia   . HTN (hypertension)   . Hypercholesterolemia   . Hypothyroidism   . IBS (irritable bowel syndrome)   . Osteoarthritis   . PONV (postoperative nausea and vomiting)   . Stroke [facility]) 03/2016    Past Surgical History:  Procedure Laterality Date  . APPENDECTOMY    . IR GENERIC HISTORICAL  01/25/2017   IR  US GUIDE VASC ACCESS RIGHT 01/25/2017 [name], MD [facility]-INTERV RAD  . IR GENERIC HISTORICAL  01/25/2017   IR FLUORO GUIDE PORT INSERTION RIGHT 01/25/2017 [name], MD [facility]-INTERV RAD  . IR REMOVAL TUN ACCESS W/ PORT W/O FL MOD SED  10/28/2017  . KYPHOPLASTY N/A 06/24/2017   Procedure: KYPHOPLASTY T10;  Surgeon: Melina Schools, MD;  Location: [place];  Service: Orthopedics;  Laterality: N/A;  90 mins  . LEFT HEART CATH AND CORONARY ANGIOGRAPHY N/A 06/16/2017   Procedure: Left Heart Cath and Coronary Angiography;  Surgeon: Sherren Mocha, MD;  Location: [facility];  Service: Cardiovascular;  Laterality: N/A;  . MASS EXCISION Right 12/22/2016   Procedure: RIGHT NECK LYMPH NODE BIOPSY;  Surgeon: Rozetta Nunnery, MD;  Location: [facility];  Service: ENT;  Laterality: Right;  . SPINE SURGERY    . stents     in heart  . TONSILLECTOMY    . TOTAL  KNEE ARTHROPLASTY Bilateral 2011,2012  . VAGINAL HYSTERECTOMY      Social History   Tobacco Use  . Smoking status: Former Research scientist (life sciences)  . Smokeless tobacco: Never Used  . Tobacco comment: quit smoking 30 years ago, very light smoker  Substance Use Topics  . Alcohol use: No    Alcohol/week: 0.0 standard drinks    Family Hx: Family History  Problem Relation Age of Onset  . Diabetes Mother   . Dementia Mother   . ALS Mother   . Heart Problems Father   . Liver disease Sister   . Cirrhosis Sister   . Heart block Unknown        CABG  . Diabetes Sister   . Heart block Son        CABG  . Other [place]  . Other Daughter        [place]  . Dementia Sister      Medications: Current Outpatient Medications  Medication Sig Dispense Refill  . aspirin 81 MG tablet Take 81 mg by mouth daily.      . Ciclopirox 8 % KIT Apply 1 application topically daily. To toe nails affected with fungal infection 34.6 each 0  . Coenzyme Q10 100 MG capsule Take 100 mg by mouth daily.     [name] Kitchen levothyroxine (SYNTHROID,  LEVOTHROID) 88 MCG tablet TAKE 1 TABLET BY MOUTH DAILY BEFORE BREAKFAST 90 tablet 1  . Melatonin 2.5 MG CAPS Take 1 capsule by mouth at bedtime.    . Multiple Vitamins-Minerals (CENTRUM SILVER PO) Take 1 tablet by mouth daily.      . nitroGLYCERIN (NITROLINGUAL) 0.4 MG/SPRAY spray Place 1 spray under the tongue every 5 (five) minutes x 3 doses as needed for chest pain (3 sprays max).    . Omega-3 Fatty Acids (FISH OIL) 1000 MG CAPS Take 1 capsule by mouth daily.     [name] Kitchen PROAIR HFA 108 (90 Base) MCG/ACT inhaler Inhale 2 puffs into the lungs every 4 (four) hours as needed for shortness of breath.     . RESTASIS 0.05 % ophthalmic emulsion Place 2 drops into both eyes 2 (two) times daily. For dry eyes    . triamcinolone cream (KENALOG) 0.1 % Apply 1 application topically 2 (two) times daily. 60 g 0  . Vitamin D, Ergocalciferol, (DRISDOL) 50000 units CAPS capsule Take 1 capsule (50,000 Units total) by mouth 2 (two) times a week. 24 capsule 2  . carvedilol (COREG) 3.125 MG tablet TAKE 1 TABLET BY MOUTH EVERY OTHER MORNING 45 tablet 1  . Tdap (BOOSTRIX) 5-2.5-18.5 LF-MCG/0.5 injection Inject 0.5 mLs into the muscle once for 1 dose. 0.5 mL 0   No current facility-administered medications for this visit.     Allergies:  Allergies  Allergen Reactions  . Fosamax [Alendronate Sodium] Other (See Comments)    "made me ache all over"     Review of Systems: General:   No F/C, wt loss Pulm:   No DIB, SOB, pleuritic chest pain Card:  No CP, palpitations Abd:  No n/v/d or pain Ext:  No inc edema from baseline  Objective:  Blood pressure 115/77, pulse 70, height '5\' 4"'$  (1.626 m), weight 129 lb 14.4 oz (58.9 kg), SpO2 99 %. Body mass index is 22.3 kg/m. Gen:   Well NAD, A and O *3 HEENT:    /AT, EOMI,  MMM Lungs:   Normal work of breathing. CTA B/L, no Wh, rhonchi Heart:  RRR, S1, S2 WNL's, no MRG Abd:   No gross distention Exts:    warm, pink,  Brisk capillary refill, warm and well perfused.   Psych:    No HI/SI, judgement and insight good, Euthymic mood. Full Affect.   Recent Results (from the past 2160 hour(s))  POCT glycosylated hemoglobin (Hb A1C)     Status: Abnormal   Collection Time: 08/10/18 11:02 AM  Result Value Ref Range   Hemoglobin A1C 5.8 (A) 4.0 - 5.6 %   HbA1c POC (<> result, manual entry)     HbA1c, POC (prediabetic range)     HbA1c, POC (controlled diabetic range)    Vitamin D 25 hydroxy     Status: None   Collection Time: 10/06/18  8:32 AM  Result Value Ref Range   Vit D, 25-Hydroxy 89.8 30.0 - 100.0 ng/mL    Comment: (NOTE) Vitamin D deficiency has been defined by the Institute of Medicine and an Endocrine Society practice guideline as a level of serum 25-OH vitamin D less than 20 ng/mL (1,2). The Endocrine Society went on to further define vitamin D insufficiency as a level between 21 and 29 ng/mL (2). 1. IOM (Institute of Medicine). 2010. Dietary reference   intakes for calcium and D. [place]: The   [employer]. 2. Holick MF, Binkley [place], Bischoff-Ferrari HA, et al.   Evaluation, treatment, and prevention of vitamin D   deficiency: an Endocrine Society clinical practice   guideline. JCEM. 2011 Jul; 96(7):1911-30. Performed At: [facility] ([place] [place], [place] [id number] Rush Farmer MD KX:[id number]   CBC with Differential/Platelet     Status: Abnormal   Collection Time: 10/06/18  8:32 AM  Result Value Ref Range   WBC 6.6 4.0 - 10.5 K/uL   RBC 4.28 3.87 - 5.11 MIL/uL   Hemoglobin 12.4 12.0 - 15.0 g/dL   HCT 38.6 36.0 - 46.0 %   MCV 90.2 80.0 - 100.0 fL   MCH 29.0 26.0 - 34.0 pg   MCHC 32.1 30.0 - 36.0 g/dL   RDW 14.4 11.5 - 15.5 %   Platelets 355 150 - 400 K/uL   nRBC 0.0 0.0 - 0.2 %   Neutrophils Relative % 52 %   Neutro Abs 3.5 1.7 - 7.7 K/uL   Lymphocytes Relative 25 %   Lymphs Abs 1.7 0.7 - 4.0 K/uL   Monocytes Relative 9 %   Monocytes Absolute 0.6 0.1 - 1.0 K/uL   Eosinophils Relative 12  %   Eosinophils Absolute 0.8 (H) 0.0 - 0.5 K/uL   Basophils Relative 1 %   Basophils Absolute 0.1 0.0 - 0.1 K/uL   Immature Granulocytes 1 %   Abs Immature Granulocytes 0.04 0.00 - 0.07 K/uL    Comment: Performed at [facility], [place] [address], [place], [place] [address]  Vitamin B12     Status: None   Collection Time: 10/07/18  8:33 AM  Result Value Ref Range   Vitamin B-12 601 232 - 1,245 pg/mL  VITAMIN D 25 Hydroxy (Vit-D Deficiency, Fractures)     Status: None   Collection Time: 10/07/18  8:33 AM  Result Value Ref Range   Vit D, 25-Hydroxy 80.2 30.0 - 100.0 ng/mL    Comment: Vitamin D deficiency has been defined by the [place] practice guideline as a level of serum 25-OH vitamin D less than 20 ng/mL (1,2). The Endocrine Society went on to further define vitamin D  insufficiency as a level between 21 and 29 ng/mL (2). 1. IOM (Institute of Medicine). 2010. Dietary reference    intakes for calcium and D. [place]: The    [employer]. 2. Holick MF, Binkley [place], Bischoff-Ferrari HA, et al.    Evaluation, treatment, and prevention of vitamin D    deficiency: an Endocrine Society clinical practice    guideline. JCEM. 2011 Jul; 96(7):1911-30.   TSH     Status: None   Collection Time: 10/07/18  8:33 AM  Result Value Ref Range   TSH 1.240 0.450 - 4.500 uIU/mL  Lipid panel     Status: Abnormal   Collection Time: 10/07/18  8:33 AM  Result Value Ref Range   Cholesterol, Total 239 (H) 100 - 199 mg/dL   Triglycerides 106 0 - 149 mg/dL   HDL 67 >39 mg/dL   VLDL Cholesterol Cal 21 5 - 40 mg/dL   LDL Calculated 151 (H) 0 - 99 mg/dL   Chol/HDL Ratio 3.6 0.0 - 4.4 ratio    Comment:                                   T. Chol/HDL Ratio                                             Men  Women                               1/2 Avg.Risk  3.4    3.3                                   Avg.Risk  5.0    4.4                                 2X Avg.Risk  9.6    7.1                                3X Avg.Risk 23.4   11.0   Hemoglobin A1c     Status: Abnormal   Collection Time: 10/07/18  8:33 AM  Result Value Ref Range   Hgb A1c MFr Bld 6.0 (H) 4.8 - 5.6 %    Comment:          Prediabetes: 5.7 - 6.4          Diabetes: >6.4          Glycemic control for adults with diabetes: <7.0    Est. average glucose Bld gHb Est-mCnc 126 mg/dL  Comprehensive metabolic panel     Status: Abnormal   Collection Time: 10/07/18  8:33 AM  Result Value Ref Range   Glucose 105 (H) 65 - 99 mg/dL   BUN 13 8 - 27 mg/dL   Creatinine, Ser 0.96 0.57 - 1.00 mg/dL   GFR calc non Af Amer 55 (L) >59 mL/min/1.73   GFR calc Af Amer 64 >59 mL/min/1.73   BUN/Creatinine Ratio 14 12 - 28   Sodium 142 134 - 144 mmol/L   Potassium 4.5 3.5 -  5.2 mmol/L   Chloride 102 96 - 106 mmol/L   CO2 27 20 - 29 mmol/L   Calcium 9.4 8.7 - 10.3 mg/dL   Total Protein 6.8 6.0 - 8.5 g/dL   Albumin 4.3 3.5 - 4.7 g/dL   Globulin, Total 2.5 1.5 - 4.5 g/dL   Albumin/Globulin Ratio 1.7 1.2 - 2.2   Bilirubin Total 0.4 0.0 - 1.2 mg/dL   Alkaline Phosphatase 51 39 - 117 IU/L   AST 23 0 - 40 IU/L   ALT 19 0 - 32 IU/L  CBC with Differential/Platelet     Status: Abnormal   Collection Time: 10/07/18  8:33 AM  Result Value Ref Range   WBC 6.3 3.4 - 10.8 x10E3/uL   RBC 4.41 3.77 - 5.28 x10E6/uL   Hemoglobin 12.5 11.1 - 15.9 g/dL   Hematocrit 38.9 34.0 - 46.6 %   MCV 88 79 - 97 fL   MCH 28.3 26.6 - 33.0 pg   MCHC 32.1 31.5 - 35.7 g/dL   RDW 13.1 12.3 - 15.4 %   Platelets 439 150 - 450 x10E3/uL   Neutrophils 48 Not Estab. %   Lymphs 29 Not Estab. %   Monocytes 8 Not Estab. %   Eos 13 Not Estab. %   Basos 1 Not Estab. %   Neutrophils Absolute 3.1 1.4 - 7.0 x10E3/uL   Lymphocytes Absolute 1.8 0.7 - 3.1 x10E3/uL   Monocytes Absolute 0.5 0.1 - 0.9 x10E3/uL   EOS (ABSOLUTE) 0.8 (H) 0.0 - 0.4 x10E3/uL   Basophils Absolute 0.1 0.0 - 0.2 x10E3/uL   Immature  Granulocytes 1 Not Estab. %   Immature Grans (Abs) 0.0 0.0 - 0.1 x10E3/uL  Sedimentation rate     Status: None   Collection Time: 10/07/18  8:33 AM  Result Value Ref Range   Sed Rate 6 0 - 40 mm/hr

## 2018-10-10 NOTE — Telephone Encounter (Signed)
Vaccine was sent into another pharmacy for the patient.  Patient is aware. MPulliam, CMA/RT(R)

## 2018-10-25 ENCOUNTER — Ambulatory Visit
Admission: RE | Admit: 2018-10-25 | Discharge: 2018-10-25 | Disposition: A | Discharge status: Home / Self Care | Payer: Medicare Other | Source: Ambulatory Visit | Attending: Family Medicine | Admitting: Family Medicine

## 2018-10-25 DIAGNOSIS — Z1231 Encounter for screening mammogram for malignant neoplasm of breast: Secondary | ICD-10-CM

## 2018-11-30 DIAGNOSIS — M79644 Pain in right finger(s): Secondary | ICD-10-CM | POA: Insufficient documentation

## 2018-12-07 ENCOUNTER — Encounter: Payer: Self-pay | Admitting: Family Medicine

## 2018-12-07 ENCOUNTER — Ambulatory Visit: Payer: Medicare Other | Admitting: Family Medicine

## 2018-12-07 VITALS — BP 123/78 | HR 65 | Temp 98.2°F | Ht 64.0 in | Wt 132.7 lb

## 2018-12-07 DIAGNOSIS — R252 Cramp and spasm: Secondary | ICD-10-CM | POA: Insufficient documentation

## 2018-12-07 DIAGNOSIS — E860 Dehydration: Secondary | ICD-10-CM | POA: Diagnosis not present

## 2018-12-07 DIAGNOSIS — G479 Sleep disorder, unspecified: Secondary | ICD-10-CM | POA: Diagnosis not present

## 2018-12-07 DIAGNOSIS — M1991 Primary osteoarthritis, unspecified site: Secondary | ICD-10-CM | POA: Insufficient documentation

## 2018-12-07 NOTE — Patient Instructions (Addendum)
Just so you know in addition to an adequate hydration and exercise, diuretics, calcium channel blockers as well as you are inhaled albuterol may make you night cramps worse.  Please try to minimize your intake of albuterol.  -Additionally, if despite adequate hydration of one half of your weight in ounces of water per day and you are walking on a regular basis-20 to 30 minutes daily and you still have leg cramps, I would recommend we send you to a vascular surgeon to see if there is any vascular insufficiency that may be contributing to the pains.    Leg Cramps Leg cramps occur when one or more muscles tighten and you have no control over this tightening (involuntary muscle contraction). Muscle cramps can develop in any muscle, but the most common place is in the calf muscles of the leg. Those cramps can occur during exercise or when you are at rest. Leg cramps are painful, and they may last for a few seconds to a few minutes. Cramps may return several times before they finally stop. Usually, leg cramps are not caused by a serious medical problem. In many cases, the cause is not known. Some common causes include:  Excessive physical effort (overexertion), such as during intense exercise.  Overuse from repetitive motions, or doing the same thing over and over.  Staying in a certain position for a long period of time.  Improper preparation, form, or technique while performing a sport or an activity.  Dehydration.  Injury.  Side effects of certain medicines.  Abnormally low levels of minerals in your blood (electrolytes), especially potassium and calcium. This could result from: ? Pregnancy. ? Taking diuretic medicines. Follow these instructions at home: Eating and drinking  Drink enough fluid to keep your urine pale yellow. Staying hydrated may help prevent cramps.  Eat a healthy diet that includes plenty of nutrients to help your muscles function. A healthy diet includes fruits and  vegetables, lean protein, whole grains, and low-fat or nonfat dairy products. Managing pain, stiffness, and swelling      Try massaging, stretching, and relaxing the affected muscle. Do this for several minutes at a time.  If directed, put ice on areas that are sore or painful after a cramp: ? Put ice in a plastic bag. ? Place a towel between your skin and the bag. ? Leave the ice on for 20 minutes, 2-3 times a day.  If directed, apply heat to muscles that are tense or tight. Do this before you exercise, or as often as told by your health care provider. Use the heat source that your health care provider recommends, such as a moist heat pack or a heating pad. ? Place a towel between your skin and the heat source. ? Leave the heat on for 20-30 minutes. ? Remove the heat if your skin turns bright red. This is especially important if you are unable to feel pain, heat, or cold. You may have a greater risk of getting burned.  Try taking hot showers or baths to help relax tight muscles. General instructions  If you are having frequent leg cramps, avoid intense exercise for several days.  Take over-the-counter and prescription medicines only as told by your health care provider.  Keep all follow-up visits as told by your health care provider. This is important. Contact a health care provider if:  Your leg cramps get more severe or more frequent, or they do not improve over time.  Your foot becomes cold, numb, or blue.  Summary  Muscle cramps can develop in any muscle, but the most common place is in the calf muscles of the leg.  Leg cramps are painful, and they may last for a few seconds to a few minutes.  Usually, leg cramps are not caused by a serious medical problem. Often, the cause is not known.  Stay hydrated and take over-the-counter and prescription medicines only as told by your health care provider. This information is not intended to replace advice given to you by your  health care provider. Make sure you discuss any questions you have with your health care provider. Document Released: 01/14/2005 Document Revised: 09/16/2017 Document Reviewed: 09/16/2017 Elsevier Interactive Patient Education  2019 [employer].

## 2018-12-07 NOTE — Progress Notes (Signed)
Pt here for an acute care OV today   Impression and Recommendations:    1. Cramps, muscle, general-mostly bilateral lower extremities, worse at night but not just nocturnal   2. Sleep difficulties   3. Dehydration, mild      1. LE Cramping - Dehydration - Tests ordered today to assess for electrolyte imbalance.  - Reviewed that patient's cramping is likely due to dehydration. - Educated patient at length about adequate hydration.  - Discussed that physical activity can also help to improve blood flow to extremities and help reduce cramping.  - Patient with no history of CHF; has had a stent placed in the past.  - Encouraged patient to drink more water, hydrate adequately, and engage in more physical activity.   Orders Placed This Encounter  Procedures  . Vitamin B12  . Magnesium  . Phosphorus  . VITAMIN D 25 Hydroxy (Vit-D Deficiency, Fractures)  . CBC with Differential/Platelet     Education and routine counseling performed. Handouts provided     Expresses verbal understanding and consents to current therapy and treatment regimen.  No barriers to understanding were identified.  Red flag symptoms and signs discussed in detail.  Patient expressed understanding regarding what to do in case of emergency\urgent symptoms   Please see AVS handed out to patient at the end of our visit for further patient instructions/ counseling done pertaining to today's office visit.   Return for f/up chronic care3-4 mo.     Note:  This document was prepared occasionally using Dragon voice recognition software and may include unintentional dictation errors in addition to a scribe.   This document serves as a record of services personally performed by [name], DO. It was created on her behalf by [name], a trained medical scribe. The creation of this record is based on the scribe's personal observations and the provider's statements to them.   I have reviewed  the above medical documentation for accuracy and completeness and I concur.  [name], DO 12/08/2018 11:44 AM        --------------------------------------------------------------------------------------------------------------------------------------------------------------------------------------------------------------------------------------------    Subjective:    CC:  Chief Complaint  Patient presents with  . Leg Pain    HPI: Kim Lane is a 84 y.o. female who presents to [place] at [facility] today for issues as discussed below.  Notes that her toes are cramping up at night.  "I have to get up and walk around to make 'em go down."  These symptoms have been going on for a few nights, "not every night," but sometimes it's "up in my legs," and "last night it was in my hip, the worst night of all."  States "I've never had anything like that before and I don't know what's going on."  She does not use a humidifier at night or in her house.  Patient believes she is drinking about two to three servings of a 16 oz container of water, up to 32-48 ounces of water.  Says she's been trying to drink more water since her last visit, "since you stressed it then."  Admits "I'm not walking as much."   No problems updated.   Wt Readings from Last 3 Encounters:  12/07/18 132 lb 11.2 oz (60.2 kg)  10/10/18 129 lb 14.4 oz (58.9 kg)  10/06/18 131 lb 3.2 oz (59.5 kg)   BP Readings from Last 3 Encounters:  12/07/18 123/78  10/10/18 115/77  10/06/18 133/67   BMI Readings  from Last 3 Encounters:  12/07/18 22.78 kg/m  10/10/18 22.30 kg/m  10/06/18 22.52 kg/m     Patient Care Team    Relationship Specialty Notifications Start End  [name], DO PCP - General Family Medicine  07/02/17   Josue Hector, MD PCP - Cardiology Cardiology Admissions 09/16/18   Josue Hector, MD Consulting Physician Cardiology  07/02/17   Penni Bombard, MD  Consulting Physician Neurology  07/02/17   Jarome Matin, MD Consulting Physician Dermatology  07/02/17   Rozetta Nunnery, MD Consulting Physician Otolaryngology  07/02/17   Brunetta Genera, MD Consulting Physician Hematology  07/02/17   Melina Schools, MD Consulting Physician Orthopedic Surgery  07/02/17   Josue Hector, MD Consulting Physician Cardiology  09/16/17      Patient Active Problem List   Diagnosis Date Noted  . Glucose intolerance (impaired glucose tolerance) 11/24/2017    Priority: High  . Hodgkin lymphoma ([place]) 04/26/2017    Priority: High  . Essential hypertension, benign 01/08/2009    Priority: High  . Coronary atherosclerosis- sees Dr. Johnsie Cancel 01/08/2009    Priority: High  . Hypothyroidism 01/08/2009    Priority: High  . Closed compression fracture of L2 lumbar vertebra 06/24/2017    Priority: Medium  . Symptomatic anemia 04/26/2017    Priority: Medium  . HYPERCHOLESTEROLEMIA, MIXED 01/08/2009    Priority: Medium  . Osteoporosis 07/23/2017    Priority: Low  . CAD S/P percutaneous coronary angioplasty 07/02/2017    Priority: Low  . Localized, primary osteoarthritis 12/07/2018  . Cramps, muscle, general-mostly bilateral lower extremities, worse at night but not just nocturnal 12/07/2018  . Pain in finger of right hand 11/30/2018  . Sleep difficulties 09/11/2018  . Muscle cramps at night 08/10/2018  . Arthritis 08/10/2018  . Dehydration, mild 08/10/2018  . S/P kyphoplasty- dr Rolena Infante- ortho 04/05/2018  . Postmenopausal 04/05/2018  . Closed fracture of multiple ribs 04/05/2018  . Allergic contact dermatitis due to cosmetics 01/26/2018  . Trigger finger, acquired- R 3rd digit 01/26/2018  . Osteoarthritis of finger 01/26/2018  . Vitamin D deficiency 11/24/2017  . Osteoarthritis 09/16/2017  . History of iron deficiency anemia-  by ONC 07/23/2017  . Abnormal nuclear cardiac imaging test 06/16/2017  . Malnutrition of moderate degree 04/27/2017  . Port  catheter in place 02/02/2017  . Nodular sclerosis Hodgkin lymphoma of lymph nodes of neck ([place]) 01/13/2017  . Dyspnea 09/14/2012  . Epistaxis 06/02/2011      Past Medical History:  Diagnosis Date  . Anemia   . Anginal pain ([place])    occ  . Blood dyscrasia 12/2016   hodgins lymphoma tx-remission  . CAD (coronary artery disease) 02/2006   Taxus stent placed to LAD and Diagonal per Dr. Olevia Perches  . Cataracts, bilateral   . Complication of anesthesia   . HTN (hypertension)   . Hypercholesterolemia   . Hypothyroidism   . IBS (irritable bowel syndrome)   . Osteoarthritis   . PONV (postoperative nausea and vomiting)   . Stroke [facility]) 03/2016     Past Surgical History:  Procedure Laterality Date  . APPENDECTOMY    . IR GENERIC HISTORICAL  01/25/2017   IR US GUIDE VASC ACCESS RIGHT 01/25/2017 [name], MD [facility]INTER[facility]  . IR GENERIC HISTORICAL  01/25/2017   IR FLUORO GUIDE PORT INSERTION RIGHT 01/25/2017 [name], MD [facility]  . IR REMOVAL TUN ACCESS W/ PORT W/O FL MOD SED  10/28/2017  . KYPHOPLASTY N/A 06/24/2017  Procedure: KYPHOPLASTY T10;  Surgeon: Melina Schools, MD;  Location: [place];  Service: Orthopedics;  Laterality: N/A;  90 mins  . LEFT HEART CATH AND CORONARY ANGIOGRAPHY N/A 06/16/2017   Procedure: Left Heart Cath and Coronary Angiography;  Surgeon: Sherren Mocha, MD;  Location: [place] [facility];  Service: Cardiovascular;  Laterality: N/A;  . MASS EXCISION Right 12/22/2016   Procedure: RIGHT NECK LYMPH NODE BIOPSY;  Surgeon: Rozetta Nunnery, MD;  Location: [place];  Service: ENT;  Laterality: Right;  . SPINE SURGERY    . stents     in heart  . TONSILLECTOMY    . TOTAL KNEE ARTHROPLASTY Bilateral 2011,2012  . VAGINAL HYSTERECTOMY       Family History  Problem Relation Age of Onset  . Diabetes Mother   . Dementia Mother   . ALS Mother   . Heart Problems Father   . Liver disease Sister   . Cirrhosis Sister   . Heart block Unknown         CABG  . Diabetes Sister   . Heart block Son        CABG  . Other [place]  . Other Daughter        [place]  . Dementia Sister   . Breast cancer Neg Hx      Social History   Socioeconomic History  . Marital status: Widowed    Spouse name: Not on file  . Number of children: 2  . Years of education: 72  . 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 Financial resource strain: Not on file  . Food insecurity:    Worry: Not on file    Inability: Not on file  . Transportation needs:    Medical: Not on file    Non-medical: Not on file  Tobacco Use  . Smoking status: Former Research scientist (life sciences)  . Smokeless tobacco: Never Used  . Tobacco comment: quit smoking 30 years ago, very light smoker  Substance and Sexual Activity  . Alcohol use: No    Alcohol/week: 0.0 standard drinks  . Drug use: No  . Sexual activity: Not on file  Lifestyle  . Physical activity:    Days per week: Not on file    Minutes per session: Not on file  . Stress: Not on file  Relationships  . Social connections:    Talks on phone: Not on file    Gets together: Not on file    Attends religious service: Not on file    Active member of club or organization: Not on file    Attends meetings of clubs or organizations: Not on file    Relationship status: Not on file  . Intimate partner violence:    Fear of current or ex partner: Not on file    Emotionally abused: Not on file    Physically abused: Not on file    Forced sexual activity: Not on file  Other Topics Concern  . Not on file  Social History Narrative   Lives alone   caffeine use- coffee- 5 cups daily     Current Meds  Medication Sig  . aspirin 81 MG tablet Take 81 mg by mouth every other day.   . carvedilol (COREG) 3.125 MG tablet TAKE 1 TABLET BY MOUTH EVERY OTHER MORNING  . Ciclopirox  8 % KIT Apply 1 application topically daily. To toe  nails affected with fungal infection  . Coenzyme Q10 100 MG capsule Take 100 mg by mouth daily.   [name] Kitchen levothyroxine (SYNTHROID, LEVOTHROID) 88 MCG tablet TAKE 1 TABLET BY MOUTH DAILY BEFORE BREAKFAST  . Melatonin 2.5 MG CAPS Take 1 capsule by mouth at bedtime.  . Multiple Vitamins-Minerals (CENTRUM SILVER PO) Take 1 tablet by mouth daily.    . nitroGLYCERIN (NITROLINGUAL) 0.4 MG/SPRAY spray Place 1 spray under the tongue every 5 (five) minutes x 3 doses as needed for chest pain (3 sprays max).  . Omega-3 Fatty Acids (FISH OIL) 1000 MG CAPS Take 1 capsule by mouth daily.   [name] Kitchen PROAIR HFA 108 (90 Base) MCG/ACT inhaler Inhale 2 puffs into the lungs every 4 (four) hours as needed for shortness of breath.   . RESTASIS 0.05 % ophthalmic emulsion Place 2 drops into both eyes 2 (two) times daily. For dry eyes  . triamcinolone cream (KENALOG) 0.1 % Apply 1 application topically 2 (two) times daily.  . Vitamin D, Ergocalciferol, (DRISDOL) 50000 units CAPS capsule Take 1 capsule (50,000 Units total) by mouth 2 (two) times a week.    Allergies:  Allergies  Allergen Reactions  . Fosamax [Alendronate Sodium] Other (See Comments)    "made me ache all over"     Review of Systems: General:   Denies fever, chills, unexplained weight loss.  Optho/Auditory:   Denies visual changes, blurred vision/LOV Respiratory:   Denies wheeze, DOE more than baseline levels.   Cardiovascular:   Denies chest pain, palpitations, new onset peripheral edema  Gastrointestinal:   Denies nausea, vomiting, diarrhea, abd pain.  Genitourinary: Denies dysuria, freq/ urgency, flank pain or discharge from genitals.  Endocrine:     Denies hot or cold intolerance, polyuria, polydipsia. Musculoskeletal:   Denies unexplained myalgias, joint swelling, unexplained arthralgias, gait problems.  Skin:  Denies new onset rash, suspicious lesions Neurological:     Denies dizziness, unexplained weakness, numbness  Psychiatric/Behavioral:   Denies  mood changes, suicidal or homicidal ideations, hallucinations    Objective:   Blood pressure 123/78, pulse 65, temperature 98.2 F (36.8 C), height '5\' 4"'$  (1.626 m), weight 132 lb 11.2 oz (60.2 kg), SpO2 98 %. Body mass index is 22.78 kg/m. General:  Well Developed, well nourished, appropriate for stated age.  Neuro:  Alert and oriented,  extra-ocular muscles intact  HEENT:  Normocephalic, atraumatic, neck supple Skin:  no gross rash, warm, pink. Cardiac:  RRR, S1 S2 Respiratory:  ECTA B/L and A/P, Not using accessory muscles, speaking in full sentences- unlabored. Vascular:  Ext warm, no cyanosis apprec.; cap RF less 2 sec. Psych:  No HI/SI, judgement and insight good, Euthymic mood. Full Affect.

## 2018-12-15 ENCOUNTER — Telehealth: Payer: Self-pay | Admitting: Cardiovascular Disease

## 2018-12-15 ENCOUNTER — Telehealth: Payer: Self-pay | Admitting: Family Medicine

## 2018-12-15 NOTE — Telephone Encounter (Signed)
Patient cld states had OV on 12/18 where nurse was unable to draw enough blood for ordered testing per Dr. Jenetta Downer--- Pt advised to go to [facility] clinic for direct BD but did not go, instead she contacted her Cardiologist office but was told (blood drw needed) -- She cld office today asking if she could come back the [facility] to have labs drawn here.   --Forwarding message to medical assistant (pt says has been drinking lots of water as directed & feels she is hydrated enough for stick.) to review with provider if BD can be done here.  --glh

## 2018-12-15 NOTE — Telephone Encounter (Signed)
Called patient left message to call the office. MPulliam, CMA/RT(R)  

## 2018-12-15 NOTE — Telephone Encounter (Signed)
Called patient about her leg cramping that has been going on for several weeks at night. After reviewing patient's PCP office visit, encouraged patient to drink more water and stay hydrated. Asked patient about the lab work that was ordered at her office visit with PCP, patient stated she did not have lab work done on that day, because they could not get any blood. Encouraged patient to call her PCP and make them aware that lab work was not collected, and she is still having leg cramps. Informed patient that having this lab work will rule out electrolyte issues. Asked patient if she is staying hydrated. Patient stated yes and she even tried mustard, that helped some. Patient was given an appointment next week by scheduling. Informed patient that she does not need to see Dr. Johnsie Cancel until her 6 month f/u in March, that her leg cramping needs to be handled by her PCP. Patient refused to cancel her appointment at this time, that she would like to talk to her PCP first. Will forward to Dr. Johnsie Cancel so he is aware.

## 2018-12-15 NOTE — Telephone Encounter (Signed)
New Message    Patient states she's having cramps in her legs and has been to her PCP already and no relief.  Patient's follow up visit isn't due until March patient wants to come in earlier.

## 2018-12-16 ENCOUNTER — Other Ambulatory Visit: Payer: Medicare Other

## 2018-12-16 NOTE — Telephone Encounter (Signed)
Patient had labs drawn in office today 12/16/2018. MPulliam, CMA/RT(R)

## 2018-12-17 LAB — CBC WITH DIFFERENTIAL/PLATELET
Basophils Absolute: 0.1 10*3/uL (ref 0.0–0.2)
Basos: 2 %
EOS (ABSOLUTE): 0.7 10*3/uL — ABNORMAL HIGH (ref 0.0–0.4)
Eos: 11 %
Hematocrit: 37.7 % (ref 34.0–46.6)
Hemoglobin: 12.6 g/dL (ref 11.1–15.9)
Immature Grans (Abs): 0 10*3/uL (ref 0.0–0.1)
Immature Granulocytes: 1 %
Lymphocytes Absolute: 1 10*3/uL (ref 0.7–3.1)
Lymphs: 16 %
MCH: 29.4 pg (ref 26.6–33.0)
MCHC: 33.4 g/dL (ref 31.5–35.7)
MCV: 88 fL (ref 79–97)
Monocytes Absolute: 0.6 10*3/uL (ref 0.1–0.9)
Monocytes: 9 %
Neutrophils Absolute: 4 10*3/uL (ref 1.4–7.0)
Neutrophils: 61 %
Platelets: 381 10*3/uL (ref 150–450)
RBC: 4.28 x10E6/uL (ref 3.77–5.28)
RDW: 13 % (ref 12.3–15.4)
WBC: 6.4 10*3/uL (ref 3.4–10.8)

## 2018-12-17 LAB — MAGNESIUM: Magnesium: 2.1 mg/dL (ref 1.6–2.3)

## 2018-12-17 LAB — PHOSPHORUS: Phosphorus: 3.2 mg/dL (ref 2.5–4.5)

## 2018-12-17 LAB — VITAMIN D 25 HYDROXY (VIT D DEFICIENCY, FRACTURES): Vit D, 25-Hydroxy: 82.4 ng/mL (ref 30.0–100.0)

## 2018-12-17 LAB — VITAMIN B12: Vitamin B-12: 427 pg/mL (ref 232–1245)

## 2018-12-20 ENCOUNTER — Ambulatory Visit: Payer: Medicare Other | Admitting: Cardiovascular Disease

## 2018-12-31 ENCOUNTER — Other Ambulatory Visit: Payer: Self-pay | Admitting: Hematology

## 2019-01-04 NOTE — Progress Notes (Signed)
HEMATOLOGY/ONCOLOGY CLINIC NOTE  Date of Service: 01/05/19  Patient Care Team: [name], DO as PCP - General (Family Medicine) Josue Hector, MD as PCP - Cardiology (Cardiology) Josue Hector, MD as Consulting Physician (Cardiology) Penni Bombard, MD as Consulting Physician (Neurology) Jarome Matin, MD as Consulting Physician (Dermatology) Rozetta Nunnery, MD as Consulting Physician (Otolaryngology) Brunetta Genera, MD as Consulting Physician (Hematology) Melina Schools, MD as Consulting Physician (Orthopedic Surgery) Josue Hector, MD as Consulting Physician (Cardiology)  CHIEF COMPLAINTS/PURPOSE OF CONSULTATION:   f/u for continued treatment of Hodgkin's lymphoma  HISTORY OF PRESENTING ILLNESS:   plz see previous   INTERVAL HISTORY   Kim Lane is here for scheduled follow up for her Hodgkins lymphoma. The patient's last visit with [place] was on 10/06/18. The pt reports that she is doing well overall.   The pt reports that she has been having some aches in her hips and in her feet, noting also that she gets cramps in her toes at night. She has addressed these concerns with her PCP and per the pt, has been recommended to stay well hydrated.   The pt notes that she is eating well and has gained some weight, and reports being back to her baseline. She notes that she is able to do most all of what she wants to do at this time. The pt denies any constitutional symptoms and denies skin rashes, itching, abdominal pains, or noticing any new lumps or bumps.   Lab results today (01/05/19) of CBC w/diff and CMP is as follows: all values are WNL except for Eosinophils abs at 700, Creatinine at 1.05, GFR at 49. 01/05/19 Sed Rate is pending   On review of systems, pt reports hip aches, feet aches, weight gain, eating well, stable energy levels, and denies fevers, chills, night sweats, unexpected weight loss, mouth sores noticing any new lumps or bumps, pain  along the spine, back pain, skin rashes, itching, abdominal pains, noticing any new lumps or bumps, and any other symptoms.    MEDICAL HISTORY:  Past Medical History:  Diagnosis Date  . Anemia   . Anginal pain ([facility])    occ  . Blood dyscrasia 12/2016   hodgins lymphoma tx-remission  . CAD (coronary artery disease) 02/2006   Taxus stent placed to LAD and Diagonal per Dr. Olevia Perches  . Cataracts, bilateral   . Complication of anesthesia   . HTN (hypertension)   . Hypercholesterolemia   . Hypothyroidism   . IBS (irritable bowel syndrome)   . Osteoarthritis   . PONV (postoperative nausea and vomiting)   . Stroke [facility]) 03/2016  Osteoporosis Cardiologist Dr. [name] -Dr. Earlean Shawl  SURGICAL HISTORY: Past Surgical History:  Procedure Laterality Date  . APPENDECTOMY    . IR GENERIC HISTORICAL  01/25/2017   IR US GUIDE VASC ACCESS RIGHT 01/25/2017 [name], MD [facility]  . IR GENERIC HISTORICAL  01/25/2017   IR FLUORO GUIDE PORT INSERTION RIGHT 01/25/2017 [name], MD [facility]  . IR REMOVAL TUN ACCESS W/ PORT W/O FL MOD SED  10/28/2017  . KYPHOPLASTY N/A 06/24/2017   Procedure: KYPHOPLASTY T10;  Surgeon: Melina Schools, MD;  Location: [place];  Service: Orthopedics;  Laterality: N/A;  90 mins  . LEFT HEART CATH AND CORONARY ANGIOGRAPHY N/A 06/16/2017   Procedure: Left Heart Cath and Coronary Angiography;  Surgeon: Sherren Mocha, MD;  Location: [facility];  Service: Cardiovascular;  Laterality: N/A;  .  MASS EXCISION Right 12/22/2016   Procedure: RIGHT NECK LYMPH NODE BIOPSY;  Surgeon: Rozetta Nunnery, MD;  Location: [place];  Service: ENT;  Laterality: Right;  . SPINE SURGERY    . stents     in heart  . TONSILLECTOMY    . TOTAL KNEE ARTHROPLASTY Bilateral 2011,2012  . VAGINAL HYSTERECTOMY      SOCIAL HISTORY: Social History   Socioeconomic History  . Marital status: Widowed    Spouse name: Not on file  . Number of children: 2  . Years  of education: 32  . 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 Financial resource strain: Not on file  . Food insecurity:    Worry: Not on file    Inability: Not on file  . Transportation needs:    Medical: Not on file    Non-medical: Not on file  Tobacco Use  . Smoking status: Former Research scientist (life sciences)  . Smokeless tobacco: Never Used  . Tobacco comment: quit smoking 30 years ago, very light smoker  Substance and Sexual Activity  . Alcohol use: No    Alcohol/week: 0.0 standard drinks  . Drug use: No  . Sexual activity: Not on file  Lifestyle  . Physical activity:    Days per week: Not on file    Minutes per session: Not on file  . Stress: Not on file  Relationships  . Social connections:    Talks on phone: Not on file    Gets together: Not on file    Attends religious service: Not on file    Active member of club or organization: Not on file    Attends meetings of clubs or organizations: Not on file    Relationship status: Not on file  . Intimate partner violence:    Fear of current or ex partner: Not on file    Emotionally abused: Not on file    Physically abused: Not on file    Forced sexual activity: Not on file  Other Topics Concern  . Not on file  Social History Narrative   Lives alone   caffeine use- coffee- 5 cups daily    FAMILY HISTORY: Family History  Problem Relation Age of Onset  . Diabetes Mother   . Dementia Mother   . ALS Mother   . Heart Problems Father   . Liver disease Sister   . Cirrhosis Sister   . Heart block Unknown        CABG  . Diabetes Sister   . Heart block Son        CABG  . Other [place]  . Other Daughter        [place]  . Dementia Sister   . Breast cancer Neg Hx     ALLERGIES:  is allergic to fosamax [alendronate sodium].  MEDICATIONS:  Current Outpatient Medications  Medication Sig Dispense Refill  . aspirin 81  MG tablet Take 81 mg by mouth every other day.     . carvedilol (COREG) 3.125 MG tablet TAKE 1 TABLET BY MOUTH EVERY OTHER MORNING 45 tablet 1  . Ciclopirox 8 % KIT Apply 1 application topically daily. To toe nails affected with fungal infection 34.6 each 0  . Coenzyme Q10 100 MG capsule Take 100 mg by mouth daily.     [name] Kitchen levothyroxine (SYNTHROID,  LEVOTHROID) 88 MCG tablet TAKE 1 TABLET BY MOUTH DAILY BEFORE BREAKFAST 90 tablet 1  . Melatonin 2.5 MG CAPS Take 1 capsule by mouth at bedtime.    . Multiple Vitamins-Minerals (CENTRUM SILVER PO) Take 1 tablet by mouth daily.      . nitroGLYCERIN (NITROLINGUAL) 0.4 MG/SPRAY spray Place 1 spray under the tongue every 5 (five) minutes x 3 doses as needed for chest pain (3 sprays max).    . Omega-3 Fatty Acids (FISH OIL) 1000 MG CAPS Take 1 capsule by mouth daily.     [name] Kitchen PROAIR HFA 108 (90 Base) MCG/ACT inhaler Inhale 2 puffs into the lungs every 4 (four) hours as needed for shortness of breath.     . RESTASIS 0.05 % ophthalmic emulsion Place 2 drops into both eyes 2 (two) times daily. For dry eyes    . triamcinolone cream (KENALOG) 0.1 % Apply 1 application topically 2 (two) times daily. 60 g 0  . Vitamin D, Ergocalciferol, (DRISDOL) 1.25 MG (50000 UT) CAPS capsule TAKE 1 CAPSULE BY MOUTH TWICE WEEKLY 24 capsule 2   No current facility-administered medications for this visit.     REVIEW OF SYSTEMS:   A 10+ POINT REVIEW OF SYSTEMS WAS OBTAINED including neurology, dermatology, psychiatry, cardiac, respiratory, lymph, extremities, GI, GU, Musculoskeletal, constitutional, breasts, reproductive, HEENT.  All pertinent positives are noted in the HPI.  All others are negative.    PHYSICAL EXAMINATION:  ECOG PERFORMANCE STATUS: 2 - Symptomatic, <50% confined to bed  Vitals:   01/05/19 0844  BP: 126/65  Pulse: 71  Resp: 18  Temp: 98.1 F (36.7 C)  TempSrc: Oral  SpO2: 99%  Weight: 132 lb 3.2 oz (60 kg)  Height: 5' 4" (1.626 m)  .  GENERAL:alert, in  no acute distress and comfortable SKIN: no acute rashes, no significant lesions EYES: conjunctiva are pink and non-injected, sclera anicteric OROPHARYNX: MMM, no exudates, no oropharyngeal erythema or ulceration NECK: supple, no JVD LYMPH:  no palpable lymphadenopathy in the cervical, axillary or inguinal regions LUNGS: clear to auscultation b/l with normal respiratory effort HEART: regular rate & rhythm ABDOMEN:  normoactive bowel sounds , non tender, not distended. No palpable hepatosplenomegaly.  Extremity: no pedal edema PSYCH: alert & oriented x 3 with fluent speech NEURO: no focal motor/sensory deficits   LABORATORY DATA:  I have reviewed the data as listed   . CBC Latest Ref Rng & Units 01/05/2019 12/16/2018 10/07/2018  WBC 4.0 - 10.5 K/uL 7.8 6.4 6.3  Hemoglobin 12.0 - 15.0 g/dL 12.3 12.6 12.5  Hematocrit 36.0 - 46.0 % 38.1 37.7 38.9  Platelets 150 - 400 K/uL 375 381 439   . CMP Latest Ref Rng & Units 01/05/2019 10/07/2018 07/07/2018  Glucose 70 - 99 mg/dL 88 105(H) 103(H)  BUN 8 - 23 mg/dL _0 Creatinine 0.44 - 1.00 mg/dL 1.05(H) 0.96 1.00  Sodium 135 - 145 mmol/L 140 142 140  Potassium 3.5 - 5.1 mmol/L 3.9 4.5 4.5  Chloride 98 - 111 mmol/L 105 102 104  CO2 22 - 32 mmol/L _1 Calcium 8.9 - 10.3 mg/dL 8.9 9.4 9.4  Total Protein 6.5 - 8.1 g/dL 7.1 6.8 -  Total Bilirubin 0.3 - 1.2 mg/dL 0.4 0.4 -  Alkaline Phos 38 - 126 U/L 69 51 -  AST 15 - 41 U/L 23 23 -  ALT 0 - 44 U/L 23 19 -   Erythrocyte Sedimentation Rate     Component Value Date/Time  ESRSEDRATE 6 10/07/2018 0833   ESRSEDRATE 20 10/07/2017 1115    RADIOGRAPHIC STUDIES: I have personally reviewed the radiological images as listed and agreed with the findings in the report. No results found.   ASSESSMENT & PLAN:   85 y.o. Caucasian female with ECOG performance status of 2 with  1) Classical Hodgkin's lymphoma of nodular sclerosis variety At least Stage IIIB some concern for possible Rt lung  involvement which makes it Stage IVB Presented with right neck lymph nodes . She had type B constitutional symptoms with 15-20 pound weight loss night sweats or chills .noted to have significant pruritus with mild rash likely from Hodgkin's which remain resolved  Wt Readings from Last 3 Encounters:  01/05/19 132 lb 3.2 oz (60 kg)  12/07/18 132 lb 11.2 oz (60.2 kg)  10/10/18 129 lb 14.4 oz (58.9 kg)  PET/CT scan after 5 cycles shows marked response to chemotherapy and stable predominantly [place] 2 as per discussion with the radiology.  2) Microcytic anemia - likely anemia of chronic disease due to Hodgkin's lymphoma. anemia resolved, Hg stable   PLAN:  -Discussed pt labwork today, 01/05/19; blood counts and chemistries are stable. Sed rate is 20 -The pt shows no clinical or lab progression/return of her Hodgkin's lymphoma at this time.  -No indication for further treatment at this time.  -No residual toxicities  -Will repeat scan in 4 months, which will be at 2 year post-treatment mark -Recommended that the pt continue to eat well, drink at least 48-64 oz of water each day, and walk 20-30 minutes each day.  -Continue high dose Vitamin D replacement -Will see the pt back in 4 months   3) T10 compression fracture based on imaging done on 05/22/2017 in ED Now status post kyphoplasty with significant improvement in her back pain  4) Osteoporosis PET/CT scan also shows possible L4 compression fracture . These compression fractures appear to be related to osteoporosis . Also had rib fracture related to no significant trauma She was previously on Fosamax that cause generalized body aches .  PLAN:  -I explained she should notify us if she is thinking of doing invasive dental procedures while on Prolia.  -Continue ergocalciferol 50k twice weekly to target a 25OH vit D level of close to 50-60 - levels adequate can reduce dose to once weekly -Continue with Prolia injection every 6 months  5)  h/o triple-vessel coronary artery disease based on cardiac cath: patient is surprisingly asymptomatic. -Continue management as per PCP and cardiology.  6) . Patient Active Problem List   Diagnosis Date Noted  . Localized, primary osteoarthritis 12/07/2018  . Cramps, muscle, general-mostly bilateral lower extremities, worse at night but not just nocturnal 12/07/2018  . Pain in finger of right hand 11/30/2018  . Sleep difficulties 09/11/2018  . Muscle cramps at night 08/10/2018  . Arthritis 08/10/2018  . Dehydration, mild 08/10/2018  . S/P kyphoplasty- dr Rolena Infante- ortho 04/05/2018  . Postmenopausal 04/05/2018  . Closed fracture of multiple ribs 04/05/2018  . Allergic contact dermatitis due to cosmetics 01/26/2018  . Trigger finger, acquired- R 3rd digit 01/26/2018  . Osteoarthritis of finger 01/26/2018  . Vitamin D deficiency 11/24/2017  . Glucose intolerance (impaired glucose tolerance) 11/24/2017  . Osteoarthritis 09/16/2017  . History of iron deficiency anemia-  by ONC 07/23/2017  . Osteoporosis 07/23/2017  . CAD S/P percutaneous coronary angioplasty 07/02/2017  . Closed compression fracture of L2 lumbar vertebra 06/24/2017  . Abnormal nuclear cardiac imaging test 06/16/2017  . Malnutrition  of moderate degree 04/27/2017  . Symptomatic anemia 04/26/2017  . Hodgkin lymphoma ([place]) 04/26/2017  . Port catheter in place 02/02/2017  . Nodular sclerosis Hodgkin lymphoma of lymph nodes of neck ([place]) 01/13/2017  . Dyspnea 09/14/2012  . Epistaxis 06/02/2011  . HYPERCHOLESTEROLEMIA, MIXED 01/08/2009  . Essential hypertension, benign 01/08/2009  . Coronary atherosclerosis- sees Dr. Johnsie Cancel 01/08/2009  . Hypothyroidism 01/08/2009  PLAN  -Continue follow-up with primary care physician for management of other chronic medical co-morbidities.   RTC with Dr [name] with labs in 4 months    All of the patients questions were answered with apparent satisfaction. The patient knows to call the  clinic with any problems, questions or concerns.   The total time spent in the appt was 25 minutes and more than 50% was on counseling and direct patient cares.     Sullivan Lone MD [place] AAHIVMS [facility] Hematology/Oncology Physician [facility]  (Office):       [phone] (Work cell):  [phone] (Fax):           [phone]  I, Baldwin [place], am acting as a scribe for Dr. Sullivan Lone.   .I have reviewed the above documentation for accuracy and completeness, and I agree with the above. Brunetta Genera MD

## 2019-01-05 ENCOUNTER — Inpatient Hospital Stay: Payer: Medicare Other

## 2019-01-05 ENCOUNTER — Telehealth: Payer: Self-pay | Admitting: Hematology

## 2019-01-05 ENCOUNTER — Inpatient Hospital Stay: Payer: Medicare Other | Admitting: Hematology

## 2019-01-05 ENCOUNTER — Inpatient Hospital Stay: Payer: Medicare Other | Attending: Hematology

## 2019-01-05 VITALS — BP 126/65 | HR 71 | Temp 98.1°F | Resp 18 | Ht 64.0 in | Wt 132.2 lb

## 2019-01-05 DIAGNOSIS — Z87891 Personal history of nicotine dependence: Secondary | ICD-10-CM

## 2019-01-05 DIAGNOSIS — Z7982 Long term (current) use of aspirin: Secondary | ICD-10-CM | POA: Diagnosis not present

## 2019-01-05 DIAGNOSIS — E559 Vitamin D deficiency, unspecified: Secondary | ICD-10-CM

## 2019-01-05 DIAGNOSIS — I10 Essential (primary) hypertension: Secondary | ICD-10-CM

## 2019-01-05 DIAGNOSIS — I2510 Atherosclerotic heart disease of native coronary artery without angina pectoris: Secondary | ICD-10-CM | POA: Diagnosis not present

## 2019-01-05 DIAGNOSIS — M818 Other osteoporosis without current pathological fracture: Secondary | ICD-10-CM

## 2019-01-05 DIAGNOSIS — E039 Hypothyroidism, unspecified: Secondary | ICD-10-CM | POA: Insufficient documentation

## 2019-01-05 DIAGNOSIS — C8118 Nodular sclerosis classical Hodgkin lymphoma, lymph nodes of multiple sites: Secondary | ICD-10-CM | POA: Insufficient documentation

## 2019-01-05 DIAGNOSIS — Z95828 Presence of other vascular implants and grafts: Secondary | ICD-10-CM

## 2019-01-05 DIAGNOSIS — C8111 Nodular sclerosis classical Hodgkin lymphoma, lymph nodes of head, face, and neck: Secondary | ICD-10-CM

## 2019-01-05 DIAGNOSIS — M8000XG Age-related osteoporosis with current pathological fracture, unspecified site, subsequent encounter for fracture with delayed healing: Secondary | ICD-10-CM

## 2019-01-05 DIAGNOSIS — C8190 Hodgkin lymphoma, unspecified, unspecified site: Secondary | ICD-10-CM

## 2019-01-05 DIAGNOSIS — M810 Age-related osteoporosis without current pathological fracture: Secondary | ICD-10-CM

## 2019-01-05 LAB — CMP (CANCER CENTER ONLY)
ALT: 23 U/L (ref 0–44)
AST: 23 U/L (ref 15–41)
Albumin: 3.9 g/dL (ref 3.5–5.0)
Alkaline Phosphatase: 69 U/L (ref 38–126)
Anion gap: 8 (ref 5–15)
BUN: 14 mg/dL (ref 8–23)
CO2: 27 mmol/L (ref 22–32)
Calcium: 8.9 mg/dL (ref 8.9–10.3)
Chloride: 105 mmol/L (ref 98–111)
Creatinine: 1.05 mg/dL — ABNORMAL HIGH (ref 0.44–1.00)
GFR, Est AFR Am: 57 mL/min — ABNORMAL LOW (ref >60)
GFR, Estimated: 49 mL/min — ABNORMAL LOW (ref >60)
Glucose, Bld: 88 mg/dL (ref 70–99)
Potassium: 3.9 mmol/L (ref 3.5–5.1)
Sodium: 140 mmol/L (ref 135–145)
Total Bilirubin: 0.4 mg/dL (ref 0.3–1.2)
Total Protein: 7.1 g/dL (ref 6.5–8.1)

## 2019-01-05 LAB — CBC WITH DIFFERENTIAL/PLATELET
Abs Immature Granulocytes: 0.05 10*3/uL (ref 0.00–0.07)
Basophils Absolute: 0.1 10*3/uL (ref 0.0–0.1)
Basophils Relative: 1 %
Eosinophils Absolute: 0.7 10*3/uL — ABNORMAL HIGH (ref 0.0–0.5)
Eosinophils Relative: 9 %
HCT: 38.1 % (ref 36.0–46.0)
Hemoglobin: 12.3 g/dL (ref 12.0–15.0)
Immature Granulocytes: 1 %
Lymphocytes Relative: 17 %
Lymphs Abs: 1.4 10*3/uL (ref 0.7–4.0)
MCH: 29.3 pg (ref 26.0–34.0)
MCHC: 32.3 g/dL (ref 30.0–36.0)
MCV: 90.7 fL (ref 80.0–100.0)
Monocytes Absolute: 0.6 10*3/uL (ref 0.1–1.0)
Monocytes Relative: 8 %
Neutro Abs: 5 10*3/uL (ref 1.7–7.7)
Neutrophils Relative %: 64 %
Platelets: 375 10*3/uL (ref 150–400)
RBC: 4.2 MIL/uL (ref 3.87–5.11)
RDW: 14.5 % (ref 11.5–15.5)
WBC: 7.8 10*3/uL (ref 4.0–10.5)
nRBC: 0 % (ref 0.0–0.2)

## 2019-01-05 LAB — SEDIMENTATION RATE: Sed Rate: 20 mm/hr (ref 0–22)

## 2019-01-05 MED ORDER — DENOSUMAB 60 MG/ML ~~LOC~~ SOSY
60.0000 mg | PREFILLED_SYRINGE | Freq: Once | SUBCUTANEOUS | Status: AC
  Administered 2019-01-05: 60 mg via SUBCUTANEOUS

## 2019-01-05 NOTE — Telephone Encounter (Signed)
Printed calendar and avs. °

## 2019-01-05 NOTE — Patient Instructions (Signed)

## 2019-01-05 NOTE — Patient Instructions (Signed)
Thank you for choosing [facility] to provide your oncology and hematology care.  To afford each patient quality time with our providers, please arrive 30 minutes before your scheduled appointment time.  If you arrive late for your appointment, you may be asked to reschedule.  We strive to give you quality time with our providers, and arriving late affects you and other patients whose appointments are after yours.    If you are a no show for multiple scheduled visits, you may be dismissed from the clinic at the providers discretion.     Again, thank you for choosing [facility], our hope is that these requests will decrease the amount of time that you wait before being seen by our physicians.  ______________________________________________________________________   Should you have questions after your visit to the [facility], please contact our office at [phone] between the hours of 8:30 and 4:30 p.m.    Voicemails left after 4:30p.m will not be returned until the following business day.     For prescription refill requests, please have your pharmacy contact us directly.  Please also try to allow 48 hours for prescription requests.     Please contact the scheduling department for questions regarding scheduling.  For scheduling of procedures such as PET scans, CT scans, MRI, Ultrasound, etc please contact central scheduling at [phone].     Resources For Cancer Patients and Caregivers:    Oncolink.org:  A wonderful resource for patients and healthcare providers for information regarding your disease, ways to tract your treatment, what to expect, etc.      American Cancer Society:  800-227-2345  Can help patients locate various types of support and financial assistance   Cancer Care: 1-800-813-HOPE (4673) Provides financial assistance, online support groups, medication/co-pay assistance.     [place] DSS:  336-641-3447 Where to apply  for food stamps, Medicaid, and utility assistance   Medicare Rights Center: 800-333-4114 Helps people with Medicare understand their rights and benefits, navigate the Medicare system, and secure the quality healthcare they deserve   SCAT: 336-333-6589 [place] [employer]'s shared-ride transportation service for eligible riders who have a disability that prevents them from riding the fixed route bus.     For additional information on assistance programs please contact our social worker:   Abigail Elmore:  [phone]  

## 2019-02-22 ENCOUNTER — Ambulatory Visit: Payer: Medicare Other | Admitting: Cardiovascular Disease

## 2019-02-22 NOTE — Progress Notes (Signed)
Patient ID: Kim Lane, female   DOB: 01-16-1934, 85 y.o.   MRN: [id number]   85 y.o. f/u for CAD had high risk myovue 06/10/17 for preoperative back surgery EF 58% Ischemia in the apical and anterior regions F/u cath done by Dr Burt Knack reviewed   Conclusion   1. Severe three-vessel coronary artery disease with:  Severe mid LAD and first diagonal stenosis with continued patency of the stented segment in the mid LAD  Severe ostial left circumflex and first OM stenoses  Severe proximal RCA and posterior AV segment stenoses  2. Normal LV function by nuclear assessment     She was allowed to have her back surgery with Dr Rolena Infante and had no cardiac complications and good pain relief  Follows with Oncology for Hodgkin's Lymphoma stable at this point   Has some restless leg syndrome and cramping in her legs. Mustard helps some Does not sound Like claudication worse in am when she wakes up and not with walking She has appointment for xrays of her back today   Daughter has throat cancer only age 39 has had XRT and has feeding tube This has been hard on her   ROS: Denies fever, malais, weight loss, blurry vision, decreased visual acuity, cough, sputum, SOB, hemoptysis, pleuritic pain, palpitaitons, heartburn, abdominal pain, melena, lower extremity edema, claudication, or rash.  All other systems reviewed and negative  General: BP 120/68 (BP Location: Left Arm, Patient Position: Sitting, Cuff Size: Normal)   Pulse 79   Ht _0  (1.549 m)   Wt 60.8 kg   SpO2 98%   BMI 25.32 kg/m  Affect appropriate Elderly white female  HEENT: normal Neck supple with no adenopathy JVP normal no bruits no thyromegaly Lungs clear with no wheezing and good diaphragmatic motion Heart:  S1/S2 SEM  murmur, no rub, gallop or click PMI normal Abdomen: benighn, BS positve, no tenderness, no AAA post appy  no bruit.  No HSM or HJR Distal pulses intact with no bruits No edema Neuro non-focal Skin warm  and dry Post bilateral TKR      Current Outpatient Medications  Medication Sig Dispense Refill  . aspirin 81 MG tablet Take 81 mg by mouth every other day.     . carvedilol (COREG) 3.125 MG tablet TAKE 1 TABLET BY MOUTH EVERY OTHER MORNING 45 tablet 1  . Ciclopirox 8 % KIT Apply 1 application topically daily. To toe nails affected with fungal infection 34.6 each 0  . Coenzyme Q10 100 MG capsule Take 100 mg by mouth daily.     [name] Kitchen levothyroxine (SYNTHROID, LEVOTHROID) 88 MCG tablet TAKE 1 TABLET BY MOUTH DAILY BEFORE BREAKFAST 90 tablet 1  . Melatonin 2.5 MG CAPS Take 1 capsule by mouth at bedtime.    . Multiple Vitamins-Minerals (CENTRUM SILVER PO) Take 1 tablet by mouth daily.      . nitroGLYCERIN (NITROLINGUAL) 0.4 MG/SPRAY spray Place 1 spray under the tongue every 5 (five) minutes x 3 doses as needed for chest pain (3 sprays max).    . Omega-3 Fatty Acids (FISH OIL) 1000 MG CAPS Take 1 capsule by mouth daily.     [name] Kitchen PROAIR HFA 108 (90 Base) MCG/ACT inhaler Inhale 2 puffs into the lungs every 4 (four) hours as needed for shortness of breath.     . RESTASIS 0.05 % ophthalmic emulsion Place 2 drops into both eyes 2 (two) times daily. For dry eyes    . triamcinolone cream (KENALOG) 0.1 % Apply 1  application topically 2 (two) times daily. 60 g 0  . Vitamin D, Ergocalciferol, (DRISDOL) 1.25 MG (50000 UT) CAPS capsule TAKE 1 CAPSULE BY MOUTH TWICE WEEKLY 24 capsule 2   No current facility-administered medications for this visit.     Allergies  Fosamax [alendronate sodium]  Electrocardiogram:  09/16/18  SR rate 83 normal   Assessment and Plan  CAD: Severe 3VD no angina Continue medical Rx and consider intervention only for unstable agnina give age and co morbidities   Thyroid  Continue replacement TSH normal labs with primary   TIA:  Non focal neuro exam for me.  Echo with bubble study negative  ECG totally normal Monitor unrevealing and patient defers LINQ impant at this  time  Lymphoma:  Has had chemo and resultant anemia needing transfusion PET scan reassuring f/u oncology Dr [name]   Back:  Post kyphoplasty with good pain relief. Compression fractures ? Due to osteopenia did not tolerate fosamax in past f/u primary   Claudication:  Leg pain doubt will order ABI's f/u primary for lumbar xrays       [name]

## 2019-02-23 ENCOUNTER — Ambulatory Visit: Payer: Medicare Other | Admitting: Family Medicine

## 2019-02-23 ENCOUNTER — Other Ambulatory Visit (HOSPITAL_COMMUNITY): Payer: Self-pay | Admitting: Cardiovascular Disease

## 2019-02-23 ENCOUNTER — Other Ambulatory Visit: Payer: Self-pay | Admitting: Cardiovascular Disease

## 2019-02-23 ENCOUNTER — Encounter: Payer: Self-pay | Admitting: Family Medicine

## 2019-02-23 ENCOUNTER — Ambulatory Visit: Payer: Medicare Other | Admitting: Cardiovascular Disease

## 2019-02-23 VITALS — BP 127/80 | HR 80 | Temp 97.7°F | Ht 64.0 in | Wt 139.3 lb

## 2019-02-23 VITALS — BP 120/68 | HR 79 | Ht 61.0 in | Wt 134.0 lb

## 2019-02-23 DIAGNOSIS — M25551 Pain in right hip: Secondary | ICD-10-CM | POA: Diagnosis not present

## 2019-02-23 DIAGNOSIS — M25552 Pain in left hip: Secondary | ICD-10-CM | POA: Diagnosis not present

## 2019-02-23 DIAGNOSIS — G8929 Other chronic pain: Secondary | ICD-10-CM

## 2019-02-23 DIAGNOSIS — I739 Peripheral vascular disease, unspecified: Secondary | ICD-10-CM | POA: Diagnosis not present

## 2019-02-23 DIAGNOSIS — I2510 Atherosclerotic heart disease of native coronary artery without angina pectoris: Secondary | ICD-10-CM | POA: Diagnosis not present

## 2019-02-23 DIAGNOSIS — M159 Polyosteoarthritis, unspecified: Secondary | ICD-10-CM | POA: Diagnosis not present

## 2019-02-23 DIAGNOSIS — M629 Disorder of muscle, unspecified: Secondary | ICD-10-CM

## 2019-02-23 DIAGNOSIS — I2583 Coronary atherosclerosis due to lipid rich plaque: Secondary | ICD-10-CM

## 2019-02-23 MED ORDER — DICLOFENAC SODIUM 1 % TD GEL: 2.0000 g | Freq: Four times a day (QID) | TRANSDERMAL | 1 refills | Status: DC

## 2019-02-23 MED ORDER — MELOXICAM 7.5 MG PO TABS: 7.5000 mg | ORAL_TABLET | Freq: Every day | ORAL | 1 refills | Status: DC

## 2019-02-23 NOTE — Progress Notes (Signed)
Pt here for an acute care OV today  Impression and Recommendations:    1. Chronic pain of both hips- R >> L   2. Hamstring tightness of both lower extremities   3. Generalized OA     1. Chronic Bilateral Hip Pain, Right >> Left; Hamstring Tightness - Extensively educated patient regarding possible causes of her symptoms.  - Discussed arthritis at length.  All questions were answered.  - Reviewed options for evaluation and treatment, including injections through Orthopedics or Sports Medicine.  - Discussed with patient today that we could obtain an X-ray here.  However, further discussed that it would be rare for patient to have a fracture causing her current complaints, since she did not have a fall.  On further discussion, patient notes that onset of pain ocurred after long periods of sitting.  After discussion, patient prefers to obtain X-rays with specialist, because specialist will also obtain X-rays on follow-up.  - Patient prefers to make an appointment with hip specialist Dr. Alvan Dame.  - Voltaren gel/cream provided today.  - Meloxicam provided today for use if sx are not alleviated on Voltaren gel. - Strongly advised prudent use of medication.  - Discussed that if an injection provides relief, this will be preferable to taking a pill daily.  - Exercises provided today to help alleviate muscle cramping.  - Discussed that patient would benefit from physical therapy. - Patient prefers referral to physical therapy in [name].   Meds ordered this encounter  Medications  . diclofenac sodium (VOLTAREN) 1 % GEL    Sig: Apply 2 g topically 4 (four) times daily. To affected joint as needed only (substitution cream is okay)    Dispense:  100 g    Refill:  1  . meloxicam (MOBIC) 7.5 MG tablet    Sig: Take 1 tablet (7.5 mg total) by mouth daily. As needed joint/ arthritis pain    Dispense:  30 tablet    Refill:  1    Patient may or may not pick up initially     Orders  Placed This Encounter  Procedures  . Ambulatory referral to Physical Therapy     Education and routine counseling performed. Handouts provided  Gross side effects, risk and benefits, and alternatives of medications and treatment plan in general discussed with patient.  Patient is aware that all medications have potential side effects and we are unable to predict every side effect or drug-drug interaction that may occur.   Patient will call with any questions prior to using medication if they have concerns.    Expresses verbal understanding and consents to current therapy and treatment regimen.  No barriers to understanding were identified.  Red flag symptoms and signs discussed in detail.  Patient expressed understanding regarding what to do in case of emergency\urgent symptoms   Please see AVS handed out to patient at the end of our visit for further patient instructions/ counseling done pertaining to today's office visit.   Return if symptoms worsen or fail to improve, for F-up of current med issues as previously d/c pt.     Note:  This document was prepared occasionally using Dragon voice recognition software and may include unintentional dictation errors in addition to a scribe.   This document serves as a record of services personally performed by [name], DO. It was created on her behalf by [name], a trained medical scribe. The creation of this record is based on the scribe's personal observations and the  provider's statements to them.   I have reviewed the above medical documentation for accuracy and completeness and I concur.  [name], DO 02/26/2019 8:35 PM       ---------------------------------------------------------------------------------------------------------------------------------   Subjective:    CC:  Chief Complaint  Patient presents with  . Hip Pain    HPI: Kim Lane is a 85 y.o. female who presents to [place] at [facility] today for issues as discussed below.  Both legs are bilaterally hurting her, and her hips are hurting her more.  This has been hurting her for about three months.  She denies falls.  Notes no injury, "I've been very, very careful."  Onset of hip pain:  Patient's daughter has had throat cancer, surgery and radiation, and has had to have IV's due to dehydration.  Patient has been driving her daughter and sitting with her daughter for two hours during treatment, not moving.  Notes her right hip hurts worse than the left.  After she walks around a little bit, her hip pain improves.  States when she first wakes up, she can hardly walk; "I'm afraid to go to the bathroom because I might fall."  She uses her cane in the morning, and does have access to a walker.  Once she gets up and moves, the pain improves.  She has been going to the chiropractor for care; has been for about 3-4 visits.  Last visit, he asked her to speak with her PCP about it, and told the patient to obtain X-rays.  Has tried:  Patient has been using Aspercreme pads and arthritis spray, to some relief of symptoms.  She has also tried horse liniment to some relief.  Had an appointment with cardiology today, and has an upcoming appointment on the 23rd to have her "blood checked" by ultrasound.  She had back surgery done in 2018.   No problems updated.   Wt Readings from Last 3 Encounters:  02/23/19 139 lb 4.8 oz (63.2 kg)  02/23/19 134 lb (60.8 kg)  01/05/19 132 lb 3.2 oz (60 kg)   BP Readings from Last 3 Encounters:  02/23/19 127/80  02/23/19 120/68  01/05/19 126/65   BMI Readings from Last 3 Encounters:  02/23/19 23.91 kg/m  02/23/19 25.32 kg/m  01/05/19 22.69 kg/m     Patient Care Team    Relationship Specialty Notifications Start End  [name], DO PCP - General Family Medicine  07/02/17   Josue Hector, MD PCP - Cardiology Cardiology Admissions 09/16/18   Josue Hector, MD Consulting Physician Cardiology  07/02/17   Penni Bombard, MD Consulting Physician Neurology  07/02/17   Jarome Matin, MD Consulting Physician Dermatology  07/02/17   Rozetta Nunnery, MD Consulting Physician Otolaryngology  07/02/17   Brunetta Genera, MD Consulting Physician Hematology  07/02/17   Melina Schools, MD Consulting Physician Orthopedic Surgery  07/02/17   Josue Hector, MD Consulting Physician Cardiology  09/16/17      Patient Active Problem List   Diagnosis Date Noted  . Glucose intolerance (impaired glucose tolerance) 11/24/2017    Priority: High  . Hodgkin lymphoma ([place]) 04/26/2017    Priority: High  . Essential hypertension, benign 01/08/2009    Priority: High  . Coronary atherosclerosis- sees Dr. Johnsie Cancel 01/08/2009    Priority: High  . Hypothyroidism 01/08/2009    Priority: High  . Closed compression fracture of L2 lumbar vertebra 06/24/2017    Priority: Medium  . Symptomatic  anemia 04/26/2017    Priority: Medium  . HYPERCHOLESTEROLEMIA, MIXED 01/08/2009    Priority: Medium  . Osteoporosis 07/23/2017    Priority: Low  . CAD S/P percutaneous coronary angioplasty 07/02/2017    Priority: Low  . Localized, primary osteoarthritis 12/07/2018  . Cramps, muscle, general-mostly bilateral lower extremities, worse at night but not just nocturnal 12/07/2018  . Pain in finger of right hand 11/30/2018  . Sleep difficulties 09/11/2018  . Muscle cramps at night 08/10/2018  . Arthritis 08/10/2018  . Dehydration, mild 08/10/2018  . S/P kyphoplasty- dr Rolena Infante- ortho 04/05/2018  . Postmenopausal 04/05/2018  . Closed fracture of multiple ribs 04/05/2018  . Allergic contact dermatitis due to cosmetics 01/26/2018  . Trigger finger, acquired- R 3rd digit 01/26/2018  . Osteoarthritis of finger 01/26/2018  . Vitamin D deficiency 11/24/2017  . Osteoarthritis 09/16/2017  . History of iron deficiency anemia-  by ONC 07/23/2017  . Abnormal nuclear cardiac  imaging test 06/16/2017  . Malnutrition of moderate degree 04/27/2017  . Port catheter in place 02/02/2017  . Nodular sclerosis Hodgkin lymphoma of lymph nodes of neck ([place]) 01/13/2017  . Dyspnea 09/14/2012  . Epistaxis 06/02/2011      Past Medical History:  Diagnosis Date  . Anemia   . Anginal pain ([place])    occ  . Blood dyscrasia 12/2016   hodgins lymphoma tx-remission  . CAD (coronary artery disease) 02/2006   Taxus stent placed to LAD and Diagonal per Dr. Olevia Perches  . Cataracts, bilateral   . Complication of anesthesia   . HTN (hypertension)   . Hypercholesterolemia   . Hypothyroidism   . IBS (irritable bowel syndrome)   . Osteoarthritis   . PONV (postoperative nausea and vomiting)   . Stroke [facility]) 03/2016     Past Surgical History:  Procedure Laterality Date  . APPENDECTOMY    . IR GENERIC HISTORICAL  01/25/2017   IR US GUIDE VASC ACCESS RIGHT 01/25/2017 [name], MD [facility]INTER[facility]  . IR GENERIC HISTORICAL  01/25/2017   IR FLUORO GUIDE PORT INSERTION RIGHT 01/25/2017 [name], MD [facility]  . IR REMOVAL TUN ACCESS W/ PORT W/O FL MOD SED  10/28/2017  . KYPHOPLASTY N/A 06/24/2017   Procedure: KYPHOPLASTY T10;  Surgeon: Melina Schools, MD;  Location: [facility];  Service: Orthopedics;  Laterality: N/A;  90 mins  . LEFT HEART CATH AND CORONARY ANGIOGRAPHY N/A 06/16/2017   Procedure: Left Heart Cath and Coronary Angiography;  Surgeon: Sherren Mocha, MD;  Location: [facility];  Service: Cardiovascular;  Laterality: N/A;  . MASS EXCISION Right 12/22/2016   Procedure: RIGHT NECK LYMPH NODE BIOPSY;  Surgeon: Rozetta Nunnery, MD;  Location: [facility];  Service: ENT;  Laterality: Right;  . SPINE SURGERY    . stents     in heart  . TONSILLECTOMY    . TOTAL KNEE ARTHROPLASTY Bilateral 2011,2012  . VAGINAL HYSTERECTOMY       Family History  Problem Relation Age of Onset  . Diabetes Mother   . Dementia Mother   . ALS Mother   . Heart Problems  Father   . Liver disease Sister   . Cirrhosis Sister   . Heart block Unknown        CABG  . Diabetes Sister   . Heart block Son        CABG  . Other [place]  . Other Daughter  GOOD HEALTH  . Dementia Sister   . Breast cancer Neg Hx      Social History   Socioeconomic History  . Marital status: Widowed    Spouse name: Not on file  . Number of children: 2  . Years of education: 54  . 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 Financial resource strain: Not on file  . Food insecurity:    Worry: Not on file    Inability: Not on file  . Transportation needs:    Medical: Not on file    Non-medical: Not on file  Tobacco Use  . Smoking status: Former Research scientist (life sciences)  . Smokeless tobacco: Never Used  . Tobacco comment: quit smoking 30 years ago, very light smoker  Substance and Sexual Activity  . Alcohol use: No    Alcohol/week: 0.0 standard drinks  . Drug use: No  . Sexual activity: Not on file  Lifestyle  . Physical activity:    Days per week: Not on file    Minutes per session: Not on file  . Stress: Not on file  Relationships  . Social connections:    Talks on phone: Not on file    Gets together: Not on file    Attends religious service: Not on file    Active member of club or organization: Not on file    Attends meetings of clubs or organizations: Not on file    Relationship status: Not on file  . Intimate partner violence:    Fear of current or ex partner: Not on file    Emotionally abused: Not on file    Physically abused: Not on file    Forced sexual activity: Not on file  Other Topics Concern  . Not on file  Social History Narrative   Lives alone   caffeine use- coffee- 5 cups daily     Current Meds  Medication Sig  . aspirin 81 MG tablet Take 81 mg by mouth every other day.   . carvedilol (COREG) 3.125 MG tablet TAKE 1 TABLET BY MOUTH  EVERY OTHER MORNING  . Ciclopirox 8 % KIT Apply 1 application topically daily. To toe nails affected with fungal infection  . Coenzyme Q10 100 MG capsule Take 100 mg by mouth daily.   [name] Kitchen levothyroxine (SYNTHROID, LEVOTHROID) 88 MCG tablet TAKE 1 TABLET BY MOUTH DAILY BEFORE BREAKFAST  . Melatonin 2.5 MG CAPS Take 1 capsule by mouth at bedtime.  . Multiple Vitamins-Minerals (CENTRUM SILVER PO) Take 1 tablet by mouth daily.    . nitroGLYCERIN (NITROLINGUAL) 0.4 MG/SPRAY spray Place 1 spray under the tongue every 5 (five) minutes x 3 doses as needed for chest pain (3 sprays max).  . Omega-3 Fatty Acids (FISH OIL) 1000 MG CAPS Take 1 capsule by mouth daily.   [name] Kitchen PROAIR HFA 108 (90 Base) MCG/ACT inhaler Inhale 2 puffs into the lungs every 4 (four) hours as needed for shortness of breath.   . RESTASIS 0.05 % ophthalmic emulsion Place 2 drops into both eyes 2 (two) times daily. For dry eyes  . triamcinolone cream (KENALOG) 0.1 % Apply 1 application topically 2 (two) times daily.  . Vitamin D, Ergocalciferol, (DRISDOL) 1.25 MG (50000 UT) CAPS capsule TAKE 1 CAPSULE BY MOUTH TWICE WEEKLY    Allergies:  Allergies  Allergen Reactions  . Fosamax [Alendronate Sodium] Other (See Comments)    "made me ache  all over"     Review of Systems: General:   Denies fever, chills, unexplained weight loss.  Optho/Auditory:   Denies visual changes, blurred vision/LOV Respiratory:   Denies wheeze, DOE more than baseline levels.   Cardiovascular:   Denies chest pain, palpitations, new onset peripheral edema  Gastrointestinal:   Denies nausea, vomiting, diarrhea, abd pain.  Genitourinary: Denies dysuria, freq/ urgency, flank pain or discharge from genitals.  Endocrine:     Denies hot or cold intolerance, polyuria, polydipsia. Musculoskeletal:   Denies unexplained myalgias, joint swelling, unexplained arthralgias, gait problems.  Skin:  Denies new onset rash, suspicious lesions Neurological:     Denies dizziness,  unexplained weakness, numbness  Psychiatric/Behavioral:   Denies mood changes, suicidal or homicidal ideations, hallucinations    Objective:   Blood pressure 127/80, pulse 80, temperature 97.7 F (36.5 C), height '5\' 4"'$  (1.626 m), weight 139 lb 4.8 oz (63.2 kg), SpO2 98 %. Body mass index is 23.91 kg/m. General:  Well Developed, well nourished, appropriate for stated age.  Neuro:  Alert and oriented,  extra-ocular muscles intact  HEENT:  Normocephalic, atraumatic, neck supple Skin:  no gross rash, warm, pink. Cardiac:  RRR, S1 S2 Respiratory:  ECTA B/L and A/P, Not using accessory muscles, speaking in full sentences- unlabored. Vascular:  Ext warm, no cyanosis apprec.; cap RF less 2 sec. Psych:  No HI/SI, judgement and insight good, Euthymic mood. Full Affect. Hips:  Hips are bilaterally tender to palpation in hip joint, as well as SI joint.  No tenderness to palpation over greater trochanteric bursa.  ROM full.

## 2019-02-23 NOTE — Patient Instructions (Addendum)
Medication Instructions:   If you need a refill on your cardiac medications before your next appointment, please call your pharmacy.   Lab work:  If you have labs (blood work) drawn today and your tests are completely normal, you will receive your results only by: [name] Kitchen MyChart Message (if you have MyChart) OR . A paper copy in the mail If you have any lab test that is abnormal or we need to change your treatment, we will call you to review the results.  Testing/Procedures: Your physician has requested that you have a lower extremity arterial  Duplex with ABI's. The ultrasound is used to evaluate arterial blood flow in the legs. Allow one hour for this exam. There are no restrictions or special instructions.  Follow-Up: At [facility], you and your health needs are our priority.  As part of our continuing mission to provide you with exceptional heart care, we have created designated Provider Care Teams.  These Care Teams include your primary Cardiologist (physician) and Advanced Practice Providers (APPs -  Physician Assistants and Nurse Practitioners) who all work together to provide you with the care you need, when you need it. You will need a follow up appointment in 12 months.  Please call our office 2 months in advance to schedule this appointment.  You may see [name], MD or one of the following Advanced Practice Providers on your designated Care Team:   Truitt Merle, NP Cecilie Kicks, NP . Kathyrn Drown, NP

## 2019-02-23 NOTE — Patient Instructions (Signed)
Kim Lane please call Dr. Aurea Graff office at [facility] since you have been there and that to you prefer to go to.  If you have any trouble making this appointment please let us know.   We also set you up for physical therapy and [name].  Please see [name] when you leave as he will talk to about the details.    Hip Pain  The hip is the joint between the upper legs and the lower pelvis. The bones, cartilage, tendons, and muscles of your hip joint support your body and allow you to move around. Hip pain can range from a minor ache to severe pain in one or both of your hips. The pain may be felt on the inside of the hip joint near the groin, or the outside near the buttocks and upper thigh. You may also have swelling or stiffness. Follow these instructions at home: Managing pain, stiffness, and swelling  If directed, apply ice to the injured area. ? Put ice in a plastic bag. ? Place a towel between your skin and the bag. ? Leave the ice on for 20 minutes, 2-3 times a day  Sleep with a pillow between your legs on your most comfortable side.  Avoid any activities that cause pain. General instructions  Take over-the-counter and prescription medicines only as told by your health care provider.  Do any exercises as told by your health care provider.  Record the following: ? How often you have hip pain. ? The location of your pain. ? What the pain feels like. ? What makes the pain worse.  Keep all follow-up visits as told by your health care provider. This is important. Contact a health care provider if:  You cannot put weight on your leg.  Your pain or swelling continues or gets worse after one week.  It gets harder to walk.  You have a fever. Get help right away if:  You fall.  You have a sudden increase in pain and swelling in your hip.  Your hip is red or swollen or very tender to touch. Summary  Hip pain can range from a minor ache to severe pain in one or both of your  hips.  The pain may be felt on the inside of the hip joint near the groin, or the outside near the buttocks and upper thigh.  Avoid any activities that cause pain.  Record how often you have hip pain, the location of the pain, what makes it worse and what it feels like. This information is not intended to replace advice given to you by your health care provider. Make sure you discuss any questions you have with your health care provider. Document Released: 05/27/2010 Document Revised: 11/09/2016 Document Reviewed: 11/09/2016 Elsevier Interactive Patient Education  2019 [employer].

## 2019-02-27 ENCOUNTER — Other Ambulatory Visit: Payer: Self-pay | Admitting: Family Medicine

## 2019-03-14 ENCOUNTER — Ambulatory Visit (HOSPITAL_COMMUNITY): Payer: Medicare Other

## 2019-03-15 ENCOUNTER — Ambulatory Visit: Payer: Medicare Other | Admitting: Family Medicine

## 2019-03-17 DIAGNOSIS — M25551 Pain in right hip: Secondary | ICD-10-CM | POA: Insufficient documentation

## 2019-03-21 ENCOUNTER — Encounter (INDEPENDENT_AMBULATORY_CARE_PROVIDER_SITE_OTHER): Payer: Self-pay

## 2019-03-21 ENCOUNTER — Other Ambulatory Visit: Payer: Self-pay

## 2019-03-21 ENCOUNTER — Ambulatory Visit (HOSPITAL_COMMUNITY)
Admission: RE | Admit: 2019-03-21 | Discharge: 2019-03-21 | Disposition: A | Discharge status: Home / Self Care | Payer: Medicare Other | Source: Ambulatory Visit | Attending: Cardiology | Admitting: Cardiology

## 2019-03-21 DIAGNOSIS — I739 Peripheral vascular disease, unspecified: Secondary | ICD-10-CM | POA: Diagnosis present

## 2019-03-31 ENCOUNTER — Encounter (HOSPITAL_COMMUNITY): Payer: Medicare Other

## 2019-04-11 DIAGNOSIS — M5416 Radiculopathy, lumbar region: Secondary | ICD-10-CM | POA: Insufficient documentation

## 2019-04-11 DIAGNOSIS — M5136 Other intervertebral disc degeneration, lumbar region: Secondary | ICD-10-CM | POA: Insufficient documentation

## 2019-04-29 ENCOUNTER — Other Ambulatory Visit: Payer: Self-pay | Admitting: Family Medicine

## 2019-04-29 DIAGNOSIS — I10 Essential (primary) hypertension: Secondary | ICD-10-CM

## 2019-05-04 NOTE — Progress Notes (Signed)
HEMATOLOGY/ONCOLOGY CLINIC NOTE  Date of Service: 05/05/19  Patient Care Team: [name], DO as PCP - General (Family Medicine) Josue Hector, MD as PCP - Cardiology (Cardiology) Josue Hector, MD as Consulting Physician (Cardiology) Penni Bombard, MD as Consulting Physician (Neurology) Jarome Matin, MD as Consulting Physician (Dermatology) Rozetta Nunnery, MD as Consulting Physician (Otolaryngology) Brunetta Genera, MD as Consulting Physician (Hematology) Melina Schools, MD as Consulting Physician (Orthopedic Surgery) Josue Hector, MD as Consulting Physician (Cardiology)  CHIEF COMPLAINTS/PURPOSE OF CONSULTATION:   f/u for continued treatment of Hodgkin's lymphoma  HISTORY OF PRESENTING ILLNESS:   plz see previous   INTERVAL HISTORY   Kim Lane is here for scheduled follow up for her Hodgkins lymphoma. The patient's last visit with [place] was on 01/05/19. The pt reports that she is doing well overall.  The pt reports that she has been staying safe and away from the crowds during the Covid-19 pandemic. She denies any concerns for infections. She also notes that she ha been eating well and endorses stable weight overall. She notes that she has had some difficulty sleeping and notes some worry over her daughter's cancer treatment. She notes that she naps during the day. She does endorse drinking tea and coffee throughout the day. She uses melatonin at night.  She notes that she has been following with orthopedics for her hip discomfort and has received steroid injections and uses a cane to ambulate, endorsing new diagnosis of arthritis. She notes that she has continued with her Vitamin D replacement twice a week.  The pt denies fevers, chills, night sweats, skin rashes and noticing any new lumps or bumps.  Lab results today (05/05/19) of CBC w/diff and CMP is as follows: all values are WNL except for PLT at 406k, Basophils abs at 200, Abs immature  granulocytes at 0.12k, BUN at 25, Creatinine at 1.05, GFR at 49. 05/05/19 Sed Rate is 11.  On review of systems, pt reports eating well, stable weight, stable energy levels, difficulty getting to sleep, hip pain, and denies concerns for infections, fevers, chills, night sweats, skin rashes, unexpected weight loss, noticing any new lumps or bumps, abdominal pains, leg swelling, and any other symptoms.   MEDICAL HISTORY:  Past Medical History:  Diagnosis Date  . Anemia   . Anginal pain ([place])    occ  . Blood dyscrasia 12/2016   hodgins lymphoma tx-remission  . CAD (coronary artery disease) 02/2006   Taxus stent placed to LAD and Diagonal per Dr. Olevia Perches  . Cataracts, bilateral   . Complication of anesthesia   . HTN (hypertension)   . Hypercholesterolemia   . Hypothyroidism   . IBS (irritable bowel syndrome)   . Osteoarthritis   . PONV (postoperative nausea and vomiting)   . Stroke [facility]) 03/2016  Osteoporosis Cardiologist Dr. [name] -Dr. Earlean Shawl  SURGICAL HISTORY: Past Surgical History:  Procedure Laterality Date  . APPENDECTOMY    . IR GENERIC HISTORICAL  01/25/2017   IR US GUIDE VASC ACCESS RIGHT 01/25/2017 [name], MD [facility]  . IR GENERIC HISTORICAL  01/25/2017   IR FLUORO GUIDE PORT INSERTION RIGHT 01/25/2017 [name], MD [facility]  . IR REMOVAL TUN ACCESS W/ PORT W/O FL MOD SED  10/28/2017  . KYPHOPLASTY N/A 06/24/2017   Procedure: KYPHOPLASTY T10;  Surgeon: Melina Schools, MD;  Location: [place];  Service: Orthopedics;  Laterality: N/A;  90 mins  . LEFT HEART CATH AND CORONARY  ANGIOGRAPHY N/A 06/16/2017   Procedure: Left Heart Cath and Coronary Angiography;  Surgeon: Sherren Mocha, MD;  Location: [place] [facility];  Service: Cardiovascular;  Laterality: N/A;  . MASS EXCISION Right 12/22/2016   Procedure: RIGHT NECK LYMPH NODE BIOPSY;  Surgeon: Rozetta Nunnery, MD;  Location: [place];  Service: ENT;  Laterality: Right;  . SPINE  SURGERY    . stents     in heart  . TONSILLECTOMY    . TOTAL KNEE ARTHROPLASTY Bilateral 2011,2012  . VAGINAL HYSTERECTOMY      SOCIAL HISTORY: Social History   Socioeconomic History  . Marital status: Widowed    Spouse name: Not on file  . Number of children: 2  . Years of education: 5  . 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 Financial resource strain: Not on file  . Food insecurity:    Worry: Not on file    Inability: Not on file  . Transportation needs:    Medical: Not on file    Non-medical: Not on file  Tobacco Use  . Smoking status: Former Research scientist (life sciences)  . Smokeless tobacco: Never Used  . Tobacco comment: quit smoking 30 years ago, very light smoker  Substance and Sexual Activity  . Alcohol use: No    Alcohol/week: 0.0 standard drinks  . Drug use: No  . Sexual activity: Not on file  Lifestyle  . Physical activity:    Days per week: Not on file    Minutes per session: Not on file  . Stress: Not on file  Relationships  . Social connections:    Talks on phone: Not on file    Gets together: Not on file    Attends religious service: Not on file    Active member of club or organization: Not on file    Attends meetings of clubs or organizations: Not on file    Relationship status: Not on file  . Intimate partner violence:    Fear of current or ex partner: Not on file    Emotionally abused: Not on file    Physically abused: Not on file    Forced sexual activity: Not on file  Other Topics Concern  . Not on file  Social History Narrative   Lives alone   caffeine use- coffee- 5 cups daily    FAMILY HISTORY: Family History  Problem Relation Age of Onset  . Diabetes Mother   . Dementia Mother   . ALS Mother   . Heart Problems Father   . Liver disease Sister   . Cirrhosis Sister   . Heart block Unknown        CABG  . Diabetes Sister   . Heart block Son         CABG  . Other [place]  . Other Daughter        [place]  . Dementia Sister   . Breast cancer Neg Hx     ALLERGIES:  is allergic to fosamax [alendronate sodium].  MEDICATIONS:  Current Outpatient Medications  Medication Sig Dispense Refill  . aspirin 81 MG tablet Take 81 mg by mouth every other day.     . carvedilol (COREG) 3.125 MG tablet TAKE 1 TABLET BY MOUTH EVERY OTHER MORNING 45 tablet 1  . Ciclopirox 8 % KIT Apply 1 application topically  daily. To toe nails affected with fungal infection 34.6 each 0  . Coenzyme Q10 100 MG capsule Take 100 mg by mouth daily.     . diclofenac sodium (VOLTAREN) 1 % GEL Apply 2 g topically 4 (four) times daily. To affected joint as needed only (substitution cream is okay) 100 g 1  . levothyroxine (SYNTHROID, LEVOTHROID) 88 MCG tablet TAKE 1 TABLET BY MOUTH DAILY BEFORE BREAKFAST 90 tablet 0  . Melatonin 2.5 MG CAPS Take 1 capsule by mouth at bedtime.    . meloxicam (MOBIC) 7.5 MG tablet Take 1 tablet (7.5 mg total) by mouth daily. As needed joint/ arthritis pain 30 tablet 1  . Multiple Vitamins-Minerals (CENTRUM SILVER PO) Take 1 tablet by mouth daily.      . nitroGLYCERIN (NITROLINGUAL) 0.4 MG/SPRAY spray Place 1 spray under the tongue every 5 (five) minutes x 3 doses as needed for chest pain (3 sprays max).    . Omega-3 Fatty Acids (FISH OIL) 1000 MG CAPS Take 1 capsule by mouth daily.     [name] Kitchen PROAIR HFA 108 (90 Base) MCG/ACT inhaler Inhale 2 puffs into the lungs every 4 (four) hours as needed for shortness of breath.     . RESTASIS 0.05 % ophthalmic emulsion Place 2 drops into both eyes 2 (two) times daily. For dry eyes    . triamcinolone cream (KENALOG) 0.1 % Apply 1 application topically 2 (two) times daily. 60 g 0  . Vitamin D, Ergocalciferol, (DRISDOL) 1.25 MG (50000 UT) CAPS capsule TAKE 1 CAPSULE BY MOUTH TWICE WEEKLY 24 capsule 2   No current facility-administered medications for this visit.     REVIEW OF SYSTEMS:    A 10+ POINT REVIEW OF SYSTEMS WAS OBTAINED including neurology, dermatology, psychiatry, cardiac, respiratory, lymph, extremities, GI, GU, Musculoskeletal, constitutional, breasts, reproductive, HEENT.  All pertinent positives are noted in the HPI.  All others are negative.   PHYSICAL EXAMINATION:  ECOG PERFORMANCE STATUS: 2 - Symptomatic, <50% confined to bed  Vitals:   05/05/19 0949  BP: 140/73  Pulse: 72  Resp: 17  Temp: 98.9 F (37.2 C)  TempSrc: Oral  SpO2: 99%  Weight: 130 lb 4.8 oz (59.1 kg)  Height: '5\' 4"'$  (1.626 m)  .  GENERAL:alert, in no acute distress and comfortable SKIN: no acute rashes, no significant lesions EYES: conjunctiva are pink and non-injected, sclera anicteric OROPHARYNX: MMM, no exudates, no oropharyngeal erythema or ulceration NECK: supple, no JVD LYMPH:  no palpable lymphadenopathy in the cervical, axillary or inguinal regions LUNGS: clear to auscultation b/l with normal respiratory effort HEART: regular rate & rhythm ABDOMEN:  normoactive bowel sounds , non tender, not distended. No palpable hepatosplenomegaly.  Extremity: no pedal edema PSYCH: alert & oriented x 3 with fluent speech NEURO: no focal motor/sensory deficits   LABORATORY DATA:  I have reviewed the data as listed   . CBC Latest Ref Rng & Units 05/05/2019 01/05/2019 12/16/2018  WBC 4.0 - 10.5 K/uL 9.3 7.8 6.4  Hemoglobin 12.0 - 15.0 g/dL 13.8 12.3 12.6  Hematocrit 36.0 - 46.0 % 42.8 38.1 37.7  Platelets 150 - 400 K/uL 406(H) 375 381   . CMP Latest Ref Rng & Units 05/05/2019 01/05/2019 10/07/2018  Glucose 70 - 99 mg/dL 89 88 105(H)  BUN 8 - 23 mg/dL 25(H) 14 13  Creatinine 0.44 - 1.00 mg/dL 1.05(H) 1.05(H) 0.96  Sodium 135 - 145 mmol/L 139 140 142  Potassium 3.5 - 5.1 mmol/L 4.2 3.9 4.5  Chloride 98 - 111  mmol/L 102 105 102  CO2 22 - 32 mmol/L '29 27 27  '$ Calcium 8.9 - 10.3 mg/dL 9.5 8.9 9.4  Total Protein 6.5 - 8.1 g/dL 7.8 7.1 6.8  Total Bilirubin 0.3 - 1.2 mg/dL 0.4 0.4 0.4   Alkaline Phos 38 - 126 U/L 60 69 51  AST 15 - 41 U/L '24 23 23  '$ ALT 0 - 44 U/L '23 23 19   '$ Erythrocyte Sedimentation Rate     Component Value Date/Time   ESRSEDRATE 20 01/05/2019 0813   ESRSEDRATE 20 10/07/2017 1115    RADIOGRAPHIC STUDIES: I have personally reviewed the radiological images as listed and agreed with the findings in the report. No results found.   ASSESSMENT & PLAN:   85 y.o. Caucasian female with ECOG performance status of 2 with  1) Classical Hodgkin's lymphoma of nodular sclerosis variety At least Stage IIIB some concern for possible Rt lung involvement which makes it Stage IVB Presented with right neck lymph nodes . She had type B constitutional symptoms with 15-20 pound weight loss night sweats or chills .noted to have significant pruritus with mild rash likely from Hodgkin's which remain resolved  Wt Readings from Last 3 Encounters:  05/05/19 130 lb 4.8 oz (59.1 kg)  02/23/19 139 lb 4.8 oz (63.2 kg)  02/23/19 134 lb (60.8 kg)  PET/CT scan after 5 cycles shows marked response to chemotherapy and stable predominantly [place] 2 as per discussion with the radiology.  S/p 5 cycles of AVD with last Cycle of AVD on 05/19/17  2) Microcytic anemia - likely anemia of chronic disease due to Hodgkin's lymphoma. anemia resolved, Hg stable   PLAN:  -Discussed pt labwork today, 05/05/19; blood counts and chemistries are stable. Sed rate is pending. -The pt shows no clinical or lab progression of her Hodgkin's lymphoma at this time.  -No indication for further treatment at this time -Recommend mindfulness techniques prior to bedtime, and recommend avoiding tea and coffee after 2pm in the afternoon for aide in sleeping -No residual toxicities  -Recommended that the pt continue to eat well, drink at least 48-64 oz of water each day, and walk 20-30 minutes each day.  -Continue high dose Vitamin D replacement -Continue Prolia injection every 6 months  -Will see the pt back  in 6 months  3) T10 compression fracture based on imaging done on 05/22/2017 in ED Now status post kyphoplasty with significant improvement in her back pain  4) Osteoporosis PET/CT scan also shows possible L4 compression fracture . These compression fractures appear to be related to osteoporosis . Also had rib fracture related to no significant trauma She was previously on Fosamax that cause generalized body aches .  PLAN:  -I explained she should notify us if she is thinking of doing invasive dental procedures while on Prolia.  -Continue ergocalciferol 50k twice weekly to target a 25OH vit D level of close to 50-60 - levels adequate can reduce dose to once weekly -Continue with Prolia injection every 6 months  5) h/o triple-vessel coronary artery disease based on cardiac cath: patient is surprisingly asymptomatic. -Continue management as per PCP and cardiology.  6) . Patient Active Problem List   Diagnosis Date Noted  . Localized, primary osteoarthritis 12/07/2018  . Cramps, muscle, general-mostly bilateral lower extremities, worse at night but not just nocturnal 12/07/2018  . Pain in finger of right hand 11/30/2018  . Sleep difficulties 09/11/2018  . Muscle cramps at night 08/10/2018  . Arthritis 08/10/2018  . Dehydration, mild  08/10/2018  . S/P kyphoplasty- dr Rolena Infante- ortho 04/05/2018  . Postmenopausal 04/05/2018  . Closed fracture of multiple ribs 04/05/2018  . Allergic contact dermatitis due to cosmetics 01/26/2018  . Trigger finger, acquired- R 3rd digit 01/26/2018  . Osteoarthritis of finger 01/26/2018  . Vitamin D deficiency 11/24/2017  . Glucose intolerance (impaired glucose tolerance) 11/24/2017  . Osteoarthritis 09/16/2017  . History of iron deficiency anemia-  by ONC 07/23/2017  . Osteoporosis 07/23/2017  . CAD S/P percutaneous coronary angioplasty 07/02/2017  . Closed compression fracture of L2 lumbar vertebra 06/24/2017  . Abnormal nuclear cardiac imaging test  06/16/2017  . Malnutrition of moderate degree 04/27/2017  . Symptomatic anemia 04/26/2017  . Hodgkin lymphoma ([place]) 04/26/2017  . Port catheter in place 02/02/2017  . Nodular sclerosis Hodgkin lymphoma of lymph nodes of neck ([place]) 01/13/2017  . Dyspnea 09/14/2012  . Epistaxis 06/02/2011  . HYPERCHOLESTEROLEMIA, MIXED 01/08/2009  . Essential hypertension, benign 01/08/2009  . Coronary atherosclerosis- sees Dr. Johnsie Cancel 01/08/2009  . Hypothyroidism 01/08/2009  PLAN  -Continue follow-up with primary care physician for management of other chronic medical co-morbidities.   -continue Prolia q6 months -RTC with Dr [name] with labs in 6 months   All of the patients questions were answered with apparent satisfaction. The patient knows to call the clinic with any problems, questions or concerns.   The total time spent in the appt was 20 minutes and more than 50% was on counseling and direct patient cares.    Sullivan Lone MD [place] AAHIVMS [facility] Hematology/Oncology Physician [facility]  (Office):       [phone] (Work cell):  [phone] (Fax):           [phone]  I, Baldwin [place], am acting as a scribe for Dr. Sullivan Lone.   .I have reviewed the above documentation for accuracy and completeness, and I agree with the above. Brunetta Genera MD

## 2019-05-05 ENCOUNTER — Other Ambulatory Visit: Payer: Self-pay

## 2019-05-05 ENCOUNTER — Inpatient Hospital Stay: Payer: Medicare Other | Admitting: Hematology

## 2019-05-05 ENCOUNTER — Inpatient Hospital Stay: Payer: Medicare Other | Attending: Hematology

## 2019-05-05 VITALS — BP 140/73 | HR 72 | Temp 98.9°F | Resp 17 | Ht 64.0 in | Wt 130.3 lb

## 2019-05-05 DIAGNOSIS — Z833 Family history of diabetes mellitus: Secondary | ICD-10-CM

## 2019-05-05 DIAGNOSIS — C8111 Nodular sclerosis classical Hodgkin lymphoma, lymph nodes of head, face, and neck: Secondary | ICD-10-CM | POA: Insufficient documentation

## 2019-05-05 DIAGNOSIS — Z8673 Personal history of transient ischemic attack (TIA), and cerebral infarction without residual deficits: Secondary | ICD-10-CM

## 2019-05-05 DIAGNOSIS — G479 Sleep disorder, unspecified: Secondary | ICD-10-CM

## 2019-05-05 DIAGNOSIS — I2510 Atherosclerotic heart disease of native coronary artery without angina pectoris: Secondary | ICD-10-CM

## 2019-05-05 DIAGNOSIS — Z79899 Other long term (current) drug therapy: Secondary | ICD-10-CM | POA: Insufficient documentation

## 2019-05-05 DIAGNOSIS — R61 Generalized hyperhidrosis: Secondary | ICD-10-CM

## 2019-05-05 DIAGNOSIS — Z87891 Personal history of nicotine dependence: Secondary | ICD-10-CM

## 2019-05-05 DIAGNOSIS — M549 Dorsalgia, unspecified: Secondary | ICD-10-CM | POA: Diagnosis not present

## 2019-05-05 DIAGNOSIS — R634 Abnormal weight loss: Secondary | ICD-10-CM | POA: Insufficient documentation

## 2019-05-05 DIAGNOSIS — E559 Vitamin D deficiency, unspecified: Secondary | ICD-10-CM | POA: Insufficient documentation

## 2019-05-05 DIAGNOSIS — Z8489 Family history of other specified conditions: Secondary | ICD-10-CM | POA: Diagnosis not present

## 2019-05-05 DIAGNOSIS — Z8249 Family history of ischemic heart disease and other diseases of the circulatory system: Secondary | ICD-10-CM | POA: Insufficient documentation

## 2019-05-05 DIAGNOSIS — R6883 Chills (without fever): Secondary | ICD-10-CM | POA: Insufficient documentation

## 2019-05-05 DIAGNOSIS — M810 Age-related osteoporosis without current pathological fracture: Secondary | ICD-10-CM | POA: Insufficient documentation

## 2019-05-05 DIAGNOSIS — D638 Anemia in other chronic diseases classified elsewhere: Secondary | ICD-10-CM | POA: Diagnosis not present

## 2019-05-05 DIAGNOSIS — Z820 Family history of epilepsy and other diseases of the nervous system: Secondary | ICD-10-CM | POA: Insufficient documentation

## 2019-05-05 DIAGNOSIS — R21 Rash and other nonspecific skin eruption: Secondary | ICD-10-CM

## 2019-05-05 DIAGNOSIS — M25559 Pain in unspecified hip: Secondary | ICD-10-CM

## 2019-05-05 DIAGNOSIS — I10 Essential (primary) hypertension: Secondary | ICD-10-CM | POA: Diagnosis not present

## 2019-05-05 LAB — CMP (CANCER CENTER ONLY)
ALT: 23 U/L (ref 0–44)
AST: 24 U/L (ref 15–41)
Albumin: 4.3 g/dL (ref 3.5–5.0)
Alkaline Phosphatase: 60 U/L (ref 38–126)
Anion gap: 8 (ref 5–15)
BUN: 25 mg/dL — ABNORMAL HIGH (ref 8–23)
CO2: 29 mmol/L (ref 22–32)
Calcium: 9.5 mg/dL (ref 8.9–10.3)
Chloride: 102 mmol/L (ref 98–111)
Creatinine: 1.05 mg/dL — ABNORMAL HIGH (ref 0.44–1.00)
GFR, Est AFR Am: 57 mL/min — ABNORMAL LOW (ref >60)
GFR, Estimated: 49 mL/min — ABNORMAL LOW (ref >60)
Glucose, Bld: 89 mg/dL (ref 70–99)
Potassium: 4.2 mmol/L (ref 3.5–5.1)
Sodium: 139 mmol/L (ref 135–145)
Total Bilirubin: 0.4 mg/dL (ref 0.3–1.2)
Total Protein: 7.8 g/dL (ref 6.5–8.1)

## 2019-05-05 LAB — CBC WITH DIFFERENTIAL/PLATELET
Abs Immature Granulocytes: 0.12 10*3/uL — ABNORMAL HIGH (ref 0.00–0.07)
Basophils Absolute: 0.2 10*3/uL — ABNORMAL HIGH (ref 0.0–0.1)
Basophils Relative: 2 %
Eosinophils Absolute: 0.5 10*3/uL (ref 0.0–0.5)
Eosinophils Relative: 5 %
HCT: 42.8 % (ref 36.0–46.0)
Hemoglobin: 13.8 g/dL (ref 12.0–15.0)
Immature Granulocytes: 1 %
Lymphocytes Relative: 24 %
Lymphs Abs: 2.2 10*3/uL (ref 0.7–4.0)
MCH: 29.2 pg (ref 26.0–34.0)
MCHC: 32.2 g/dL (ref 30.0–36.0)
MCV: 90.5 fL (ref 80.0–100.0)
Monocytes Absolute: 1 10*3/uL (ref 0.1–1.0)
Monocytes Relative: 10 %
Neutro Abs: 5.4 10*3/uL (ref 1.7–7.7)
Neutrophils Relative %: 58 %
Platelets: 406 10*3/uL — ABNORMAL HIGH (ref 150–400)
RBC: 4.73 MIL/uL (ref 3.87–5.11)
RDW: 14.6 % (ref 11.5–15.5)
WBC: 9.3 10*3/uL (ref 4.0–10.5)
nRBC: 0 % (ref 0.0–0.2)

## 2019-05-05 LAB — SEDIMENTATION RATE: Sed Rate: 11 mm/hr (ref 0–22)

## 2019-05-08 ENCOUNTER — Telehealth: Payer: Self-pay | Admitting: Hematology

## 2019-05-08 NOTE — Telephone Encounter (Signed)
Scheduled appt per 5/18 los. ° °A calendar will be mailed out. °

## 2019-05-31 ENCOUNTER — Other Ambulatory Visit: Payer: Self-pay

## 2019-05-31 ENCOUNTER — Ambulatory Visit: Payer: Medicare Other | Admitting: Family Medicine

## 2019-05-31 ENCOUNTER — Encounter: Payer: Self-pay | Admitting: Family Medicine

## 2019-05-31 VITALS — BP 130/72 | HR 76 | Temp 98.9°F | Ht 61.0 in | Wt 137.1 lb

## 2019-05-31 DIAGNOSIS — Z8673 Personal history of transient ischemic attack (TIA), and cerebral infarction without residual deficits: Secondary | ICD-10-CM | POA: Diagnosis not present

## 2019-05-31 DIAGNOSIS — R55 Syncope and collapse: Secondary | ICD-10-CM | POA: Diagnosis not present

## 2019-05-31 DIAGNOSIS — G459 Transient cerebral ischemic attack, unspecified: Secondary | ICD-10-CM

## 2019-05-31 DIAGNOSIS — L259 Unspecified contact dermatitis, unspecified cause: Secondary | ICD-10-CM

## 2019-05-31 DIAGNOSIS — L988 Other specified disorders of the skin and subcutaneous tissue: Secondary | ICD-10-CM | POA: Insufficient documentation

## 2019-05-31 DIAGNOSIS — R21 Rash and other nonspecific skin eruption: Secondary | ICD-10-CM | POA: Diagnosis not present

## 2019-05-31 NOTE — Progress Notes (Signed)
Acute/  96min ov   Impression and Recommendations:    1. Syncope and collapse   2. Highly suspicious for TIA given patient's history   3. History of CVA (cerebrovascular accident)- 2017-     4. Blistering rash-new onset right palmar surface hand- at base esp btwn fingers   5. Maceration of skin   6. Contact dermatitis, unspecified-bilateral lower extremity     -Upon extensive review of the chart b/c patient did not recall last time she saw her neurologist, medical history came up that she did have a CVA in the past.  This was new news to me today, this information was never conveyed to me by the patient prior  As part of my medical decision making, I reviewed the following data within the [facility] History from pt, Labs reviewed, Old chart notes reviewed, Radiographic- related tests reviewed if applicable and OV notes from prior OV's with me as well as other specialists she has seen since seeing me last.  -Without further workup and treatment planned, risk to pt's morbidity is high with possible prolonged functional impairment and poor health outcomes.   -New syncopal event /History CVA:   -Appears there is no residual effects or deficits on physical exam today.  In the past she did have left hemiparesis. -Patient has been taking her aspirin every other day.  Told her to take the 81 mg daily.   - Advised patient she needs strict control of her blood pressure, cholesterol and engaged in intensive dietary and lifestyle habits to decrease progression of her atherosclerotic disease.  -History hypertension- well-controlled currently-continue meds.  - History of hyperlipidemia- no statin, with extensive CAD history- stents etc.   -Last set of full set up blood work was October 2019.    Ref Range & Units 8mo ago  Cholesterol, Total 100 - 199 mg/dL 239High    Triglycerides 0 - 149 mg/dL 106   HDL >39 mg/dL 67   VLDL Cholesterol Cal 5 - 40 mg/dL 21   LDL Calculated 0 -  99 mg/dL 151High    Chol/HDL Ratio 0.0 - 4.4 ratio 3.6    - Patient has historically declined statins with prior OV's with me - she told me she would discuss with her cardiologist several times in past.  Sees Dr. Magnus Sinning for her CAD mgt.    - Back many years ago she was on Zocor- 2013 and  possibly prior/later.   - In addition to the CVA history,  she has stents and extensive CAD and needs intensive med mgt of her HLD for that issue as well.  - Since patient's preference is to have this managed by her cardiologist and she has declined me putting her on statin several times in the past, I will send a message to her cardiologist now to address this  Rash: Blistering rash on hand with maceration of tissue- only on palmar surface and only at the bases of fingers/thumb.  However similar appearing rash on bilateral anterior legs.  This rash also appeared same time as the other-they are likely related and since not in a dermatomal pattern zoster is less likely-we will hold off on antiviral. -Likely to be contact dermatitis. -.  For the maceration of her tissue:  We discussed keeping her skin open to air and keeping her fingers spread apart is critical.   We showed her how to do this with putting Curlex rolls between the fingers to keep them comfortably  spread apart and then gently using a Curlex to cover the blisters up loosely.  She was given strict instructions that if she develops any side of redness, pus etc. she will let us know.  Since were not exactly sure what is the cause, we will hold off on providing a treatment until cultures come back -Furthermore patient has a dermatologist Dr. Jarome Matin that she sees yearly however, upon asking our clinical referral coordinator to please see if they can get around over the next week or 2, he needed a referral to do so.  Referral was placed.  --Procedure note: Consent was obtained after risk-benefit procedure discussed with patient.  Sterile I&D performed to  obtain liquid for viral culture as well as anaerobic and aerobic bacteria of larger blister on palmar surface right hand..   Syncope and collapse - Plan: Ambulatory referral to Neurology, Anaerobic and Aerobic Culture, Virus culture  Highly suspicious for TIA given patient's history - upon thorough review of patient's chart during OV today,several years back, patient did have a CVA.  I reviewed MRI report- seen by Dr. Leta Baptist for this.  - Plan: Ambulatory referral to Neurology, Anaerobic and Aerobic Culture, Virus culture  History of CVA (cerebrovascular accident)- 2017-   - Last seen by neurology Dr. Leta Baptist on 12 of 2017.-This is something patient has never mentioned to me and she denies h/o CVA- but found in chart - Plan: Ambulatory referral to Neurology, Anaerobic and Aerobic Culture, Virus culture  Blistering rash-new onset right palmar surface hand- at base esp btwn fingers - Plan: Ambulatory referral to Dermatology, Anaerobic and Aerobic Culture, Virus culture  Maceration of skin  Contact dermatitis, unspecified-bilateral lower extremity - Plan: Ambulatory referral to Dermatology, Anaerobic and Aerobic Culture, Virus culture    Orders Placed This Encounter  Procedures   Anaerobic and Aerobic Culture   Virus culture   Ambulatory referral to Neurology   Ambulatory referral to Dermatology   Gross side effects, risk and benefits, and alternatives of medications and treatment plan in general discussed with patient.  Patient is aware that all medications have potential side effects and we are unable to predict every side effect or drug-drug interaction that may occur.   Patient will call with any questions prior to using medication if they have concerns.    Expresses verbal understanding and consents to current therapy and treatment regimen.  No barriers to understanding were identified.  Red flag symptoms and signs discussed in detail.  Patient expressed understanding regarding  what to do in case of emergency\urgent symptoms  Please see AVS handed out to patient at the end of our visit for further patient instructions/ counseling done pertaining to today's office visit.   Return for 2) in person in 2-3 weeks-  3 d after full set fasting blood work.     Pt was interviewed and evaluated by me in the clinic today for 40.5+ minutes, with over 50% time spent in face to face counseling of patients various medical conditions, treatment plans of those medical conditions including medicine management and lifestyle modification, strategies to improve health and well being; and in coordination of care. SEE ABOVE TREATMENT PLAN FOR DETAILS  Note:  This note was prepared with assistance of Dragon voice recognition software. Occasional wrong-word or sound-a-like substitutions may have occurred due to the inherent limitations of voice recognition software.  [name], DO 05/31/2019 7:03 PM     --------------------------------------------------------------------------------------------------------------------------------------------------------------------------------------------------------------------------------------------    Subjective:     HPI: Kim Lane  is a 85 y.o. female who presents to [place] at [facility] today for issues as discussed below.   Last week pt was on the commode in the middle of the night and she felt like she was going vomit/get sick- and she fell off commode and hit hard floor with L side face.  She denies any dysarthria, focal neurological symptoms.  There was no hemiparesis or hemisensory disturbances.  She denies waking up confused or having any mental disabilities.  Today I learned that patient back in 12 of 2017 was seen by neurology Dr. Leta Baptist  for diagnosis of a CVA where she had balance difficulties.  She was told that time to take aspirin 81 mg daily and work on controlling her risk factors blood pressure,  cholesterol etc.  Patient did not follow-up with them again.  Patient today stated that on Sunday she started noticing a rash developing on the right fingers at the base where they meet each other.  It was not stinging or itchy nor was it tender or sting me or any nerve related symptoms.  She does not have a rash elsewhere when asked but I noticed there was rash on her bilateral lower extremities.  She said it came up the next day in the morning when she was dressing that she noticed a similar rash on her lower extremities.  Patient has a dermatologist that she sees yearly and as needed for her "skin stuff " but did not call them.  She denies any other prodromal symptoms or any fever chills malaise, symptoms consistent with illness etc.  She has not been around anybody with a rash.  She denies any new exposures to any chemicals, plants, etc.  She states she has not had any new medications or creams, soaps etc.   Wt Readings from Last 3 Encounters:  05/31/19 137 lb 1.6 oz (62.2 kg)  05/05/19 130 lb 4.8 oz (59.1 kg)  02/23/19 139 lb 4.8 oz (63.2 kg)   BP Readings from Last 3 Encounters:  05/31/19 130/72  05/05/19 140/73  02/23/19 127/80   Pulse Readings from Last 3 Encounters:  05/31/19 76  05/05/19 72  02/23/19 80   BMI Readings from Last 3 Encounters:  05/31/19 25.90 kg/m  05/05/19 22.37 kg/m  02/23/19 23.91 kg/m     Patient Care Team    Relationship Specialty Notifications Start End  [name], DO PCP - General Family Medicine  07/02/17   Josue Hector, MD PCP - Cardiology Cardiology Admissions 09/16/18   Josue Hector, MD Consulting Physician Cardiology  07/02/17   Penni Bombard, MD Consulting Physician Neurology  07/02/17   Jarome Matin, MD Consulting Physician Dermatology  07/02/17   Rozetta Nunnery, MD Consulting Physician Otolaryngology  07/02/17   Brunetta Genera, MD Consulting Physician Hematology  07/02/17   Melina Schools, MD Consulting Physician  Orthopedic Surgery  07/02/17   Brunetta Genera, MD Consulting Physician Hematology  05/31/19 05/31/19     Patient Active Problem List   Diagnosis Date Noted   Glucose intolerance (impaired glucose tolerance) 11/24/2017    Priority: High   Hodgkin lymphoma ([place]) 04/26/2017    Priority: High   Essential hypertension, benign 01/08/2009    Priority: High   Coronary atherosclerosis- sees Dr. Johnsie Cancel 01/08/2009    Priority: High   Hypothyroidism 01/08/2009    Priority: High   Closed compression fracture of L2 lumbar vertebra 06/24/2017    Priority: Medium   Symptomatic  anemia 04/26/2017    Priority: Medium   HYPERCHOLESTEROLEMIA, MIXED 01/08/2009    Priority: Medium   Osteoporosis 07/23/2017    Priority: Low   CAD S/P percutaneous coronary angioplasty 07/02/2017    Priority: Low   History of CVA (cerebrovascular accident)- 2017-   05/31/2019   Localized, primary osteoarthritis 12/07/2018   Cramps, muscle, general-mostly bilateral lower extremities, worse at night but not just nocturnal 12/07/2018   Pain in finger of right hand 11/30/2018   Sleep difficulties 09/11/2018   Muscle cramps at night 08/10/2018   Arthritis 08/10/2018   Dehydration, mild 08/10/2018   S/P kyphoplasty- dr brooks- ortho 04/05/2018   Postmenopausal 04/05/2018   Closed fracture of multiple ribs 04/05/2018   Allergic contact dermatitis due to cosmetics 01/26/2018   Trigger finger, acquired- R 3rd digit 01/26/2018   Osteoarthritis of finger 01/26/2018   Vitamin D deficiency 11/24/2017   Osteoarthritis 09/16/2017   History of iron deficiency anemia-  by ONC 07/23/2017   Abnormal nuclear cardiac imaging test 06/16/2017   Malnutrition of moderate degree 04/27/2017   Port catheter in place 02/02/2017   Nodular sclerosis Hodgkin lymphoma of lymph nodes of neck ([place]) 01/13/2017   Dyspnea 09/14/2012   Epistaxis 06/02/2011    Past Medical history, Surgical history, Family  history, Social history, Allergies and Medications have been entered into the medical record, reviewed and changed as needed.    Current Meds  Medication Sig   aspirin 81 MG tablet Take 81 mg by mouth every other day.    carvedilol (COREG) 3.125 MG tablet TAKE 1 TABLET BY MOUTH EVERY OTHER MORNING   Coenzyme Q10 100 MG capsule Take 100 mg by mouth daily.    diclofenac sodium (VOLTAREN) 1 % GEL Apply 2 g topically 4 (four) times daily. To affected joint as needed only (substitution cream is okay)   levothyroxine (SYNTHROID, LEVOTHROID) 88 MCG tablet TAKE 1 TABLET BY MOUTH DAILY BEFORE BREAKFAST   Melatonin 2.5 MG CAPS Take 1 capsule by mouth at bedtime.   meloxicam (MOBIC) 7.5 MG tablet Take 1 tablet (7.5 mg total) by mouth daily. As needed joint/ arthritis pain   Multiple Vitamins-Minerals (CENTRUM SILVER PO) Take 1 tablet by mouth daily.     nitroGLYCERIN (NITROLINGUAL) 0.4 MG/SPRAY spray Place 1 spray under the tongue every 5 (five) minutes x 3 doses as needed for chest pain (3 sprays max).   Omega-3 Fatty Acids (FISH OIL) 1000 MG CAPS Take 1 capsule by mouth daily.    PROAIR HFA 108 (90 Base) MCG/ACT inhaler Inhale 2 puffs into the lungs every 4 (four) hours as needed for shortness of breath.    RESTASIS 0.05 % ophthalmic emulsion Place 2 drops into both eyes 2 (two) times daily. For dry eyes   triamcinolone cream (KENALOG) 0.1 % Apply 1 application topically 2 (two) times daily.   Vitamin D, Ergocalciferol, (DRISDOL) 1.25 MG (50000 UT) CAPS capsule TAKE 1 CAPSULE BY MOUTH TWICE WEEKLY    Allergies:  Allergies  Allergen Reactions   Fosamax [Alendronate Sodium] Other (See Comments)    "made me ache all over"     Review of Systems:  A fourteen system review of systems was performed and found to be positive as per HPI.   Objective:   Blood pressure 130/72, pulse 76, temperature 98.9 F (37.2 C), temperature source Oral, height 5\' 1"  (1.549 m), weight 137 lb 1.6 oz  (62.2 kg), SpO2 95 %. Body mass index is 25.9 kg/m. General:  Well Developed, well  nourished, appropriate for stated age.  HEENT: No dysarthria noted.  Normocephalic, atraumatic, neck supple, no carotid bruits appreciated  Skin: Tissue maceration between the fingers at the base on right hand.  Also blistering rash with clear to yellowish filled fluid at the same locations and palmar surface at the base of thumb.   -Face: L side face.  - Has a little swelling under L eye.  And over bridge of nose with slight ecchymosis. Cardiac:  RRR, S1 S2 Respiratory:  ECTA B/L and A/P, Not using accessory muscles, speaking in full sentences- unlabored. Neuro: A and O x3.  Dress appropriate, grooming hygiene appropriate.  Behavior-with facial expression-propria; mood and affect within normal limits.   Cognition- then normal limits.  Throught process appear to have thorough content and consistency.  No suicidal ideations.  Insight appears good.   Cranial nerves II through XII appear intact.  PERRLA, motor function appears within normal limits- No facial droop.  Good strength bilateral facial muscles as well as upper and lower bodies bilaterally.  5-5 strength bilateral and equal.  Muscle bulk and tone are normal.  No deficit appreciated.  Sensation appears intact to light touch throughout.  Reflexes are 2+ and symmetrical at the biceps tripe sepsis knees and ankles.  Plantar responses are flexor- Downgoing toes. -Finger-to-nose shows no dysmetria.  Romberg is absent.   There is no pronator drift with outstretched arms..  No cerebral findings.  Posture is normal, gait is steady with normal arm swing etc.  Heel toe walking appears normal Vascular:  Ext warm, no cyanosis apprec; cap RF less 2 sec. Psych:  No HI/SI, judgement and insight good, Euthymic mood. Full Affect.

## 2019-05-31 NOTE — Patient Instructions (Signed)
Blisters, Adult  A blister is a raised bubble of skin filled with liquid. Blisters often develop in an area of the skin that repeatedly rubs or presses against another surface (friction blister). Friction blisters can occur on any part of the body, but they usually develop on the hands or feet. Long-term pressure on the same area of the skin can also lead to areas of hardened skin (calluses). What are the causes? A blister can be caused by:  An injury.  A burn.  An allergic reaction.  An infection.  Exposure to irritating chemicals.  Friction, especially in an area with a lot of heat and moisture. Friction blisters often result from:  Sports.  Repetitive activities.  Using tools and doing other activities without wearing gloves.  Shoes that are too tight or too loose. What are the signs or symptoms? A blister is often round and looks like a bump. It may:  Itch.  Be painful to the touch. Before a blister forms, the skin may:  Become red.  Feel warm.  Itch.  Be painful to the touch. How is this diagnosed? A blister is diagnosed with a physical exam. How is this treated? Treatment usually involves protecting the area where the blister has formed until the skin has healed. Other treatments may include:  A bandage (dressing) to cover the blister.  Extra padding around and over the blister, so that it does not rub on anything.  Antibiotic ointment. Most blisters break open, dry up, and go away on their own within 1-2 weeks. Blisters that are very painful may be drained before they break open on their own. If the blister is large or painful, it can be drained by: 1. Sterilizing a small needle with rubbing alcohol. 2. Washing your hands with soap and water. 3. Inserting the needle in the edge of the blister to make a small hole. Some fluid will drain out of the hole. Let the top or roof of the blister stay in place. This helps the skin heal. 4. Washing the blister with  mild soap and water. 5. Covering the blister with antibiotic ointment, if prescribed by your health care provider, and a dressing. Some blisters may need to be drained by a health care provider. Follow these instructions at home:  Protect the area where the blister has formed as told by your health care provider.  Keep your blister clean and dry. This helps to prevent infection.  Do not pop your blister. This can cause infection.  If you were prescribed an antibiotic, use it as told by your health care provider. Do not stop using the antibiotic even if your condition improves.  Wear different shoes until the blister heals.  Avoid the activity that caused the blister until your blister heals.  Check your blister every day for signs of infection. Check for: ? More redness, swelling, or pain. ? More fluid or blood. ? Warmth. ? Pus or a bad smell. ? The blister getting better and then getting worse. How is this prevented? Taking these steps can help to prevent blisters that are caused by friction. Make sure you:  Wear comfortable shoes that fit well.  Always wear socks with shoes.  Wear extra socks or use tape, bandages, or pads over blister-prone areas as needed. You may also apply petroleum jelly under bandages in blister-prone areas.  Wear protective gear, such as gloves, when participating in sports or activities that can cause blisters.  Wear loose-fitting, moisture-wicking clothes when participating in  sports or activities.  Use powders as needed to keep your feet dry. Contact a health care provider if:  You have more redness, swelling, or pain around your blister.  You have more fluid or blood coming from your blister.  Your blister feels warm to the touch.  You have pus or a bad smell coming from your blister.  You have a fever or chills.  Your blister gets better and then it gets worse. This information is not intended to replace advice given to you by your  health care provider. Make sure you discuss any questions you have with your health care provider. Document Released: 01/14/2005 Document Revised: 08/05/2016 Document Reviewed: 06/19/2016 Elsevier Interactive Patient Education  2019 [employer].     Stroke Prevention Some medical conditions and behaviors are associated with a higher chance of having a stroke. You can help prevent a stroke by making nutrition, lifestyle, and other changes, including managing any medical conditions you may have. What nutrition changes can be made?   Eat healthy foods. You can do this by: ? Choosing foods high in fiber, such as fresh fruits and vegetables and whole grains. ? Eating at least 5 or more servings of fruits and vegetables a day. Try to fill half of your plate at each meal with fruits and vegetables. ? Choosing lean protein foods, such as lean cuts of meat, poultry without skin, fish, tofu, beans, and nuts. ? Eating low-fat dairy products. ? Avoiding foods that are high in salt (sodium). This can help lower blood pressure. ? Avoiding foods that have saturated fat, trans fat, and cholesterol. This can help prevent high cholesterol. ? Avoiding processed and premade foods.  Follow your health care provider's specific guidelines for losing weight, controlling high blood pressure (hypertension), lowering high cholesterol, and managing diabetes. These may include: ? Reducing your daily calorie intake. ? Limiting your daily sodium intake to 1,500 milligrams (mg). ? Using only healthy fats for cooking, such as olive oil, canola oil, or sunflower oil. ? Counting your daily carbohydrate intake. What lifestyle changes can be made?  Maintain a healthy weight. Talk to your health care provider about your ideal weight.  Get at least 30 minutes of moderate physical activity at least 5 days a week. Moderate activity includes brisk walking, biking, and swimming.  Do not use any products that contain nicotine  or tobacco, such as cigarettes and e-cigarettes. If you need help quitting, ask your health care provider. It may also be helpful to avoid exposure to secondhand smoke.  Limit alcohol intake to no more than 1 drink a day for nonpregnant women and 2 drinks a day for men. One drink equals 12 oz of beer, 5 oz of wine, or 1 oz of hard liquor.  Stop any illegal drug use.  Avoid taking birth control pills. Talk to your health care provider about the risks of taking birth control pills if: ? You are over 48 years old. ? You smoke. ? You get migraines. ? You have ever had a blood clot. What other changes can be made?  Manage your cholesterol levels. ? Eating a healthy diet is important for preventing high cholesterol. If cholesterol cannot be managed through diet alone, you may also need to take medicines. ? Take any prescribed medicines to control your cholesterol as told by your health care provider.  Manage your diabetes. ? Eating a healthy diet and exercising regularly are important parts of managing your blood sugar. If your blood sugar cannot  be managed through diet and exercise, you may need to take medicines. ? Take any prescribed medicines to control your diabetes as told by your health care provider.  Control your hypertension. ? To reduce your risk of stroke, try to keep your blood pressure below 130/80. ? Eating a healthy diet and exercising regularly are an important part of controlling your blood pressure. If your blood pressure cannot be managed through diet and exercise, you may need to take medicines. ? Take any prescribed medicines to control hypertension as told by your health care provider. ? Ask your health care provider if you should monitor your blood pressure at home. ? Have your blood pressure checked every year, even if your blood pressure is normal. Blood pressure increases with age and some medical conditions.  Get evaluated for sleep disorders (sleep apnea). Talk to  your health care provider about getting a sleep evaluation if you snore a lot or have excessive sleepiness.  Take over-the-counter and prescription medicines only as told by your health care provider. Aspirin or blood thinners (antiplatelets or anticoagulants) may be recommended to reduce your risk of forming blood clots that can lead to stroke.  Make sure that any other medical conditions you have, such as atrial fibrillation or atherosclerosis, are managed. What are the warning signs of a stroke? The warning signs of a stroke can be easily remembered as BEFAST.  B is for balance. Signs include: ? Dizziness. ? Loss of balance or coordination. ? Sudden trouble walking.  E is for eyes. Signs include: ? A sudden change in vision. ? Trouble seeing.  F is for face. Signs include: ? Sudden weakness or numbness of the face. ? The face or eyelid drooping to one side.  A is for arms. Signs include: ? Sudden weakness or numbness of the arm, usually on one side of the body.  S is for speech. Signs include: ? Trouble speaking (aphasia). ? Trouble understanding.  T is for time. ? These symptoms may represent a serious problem that is an emergency. Do not wait to see if the symptoms will go away. Get medical help right away. Call your local emergency services (911 in the [place] Do not drive yourself to the hospital.  Other signs of stroke may include: ? A sudden, severe headache with no known cause. ? Nausea or vomiting. ? Seizure. Where to find more information For more information, visit:  American Stroke Association: www.strokeassociation.org  National Stroke Association: www.stroke.org Summary  You can prevent a stroke by eating healthy, exercising, not smoking, limiting alcohol intake, and managing any medical conditions you may have.  Do not use any products that contain nicotine or tobacco, such as cigarettes and e-cigarettes. If you need help quitting, ask your health care  provider. It may also be helpful to avoid exposure to secondhand smoke.  Remember BEFAST for warning signs of stroke. Get help right away if you or a loved one has any of these signs. This information is not intended to replace advice given to you by your health care provider. Make sure you discuss any questions you have with your health care provider. Document Released: 01/14/2005 Document Revised: 01/12/2017 Document Reviewed: 01/12/2017 Elsevier Interactive Patient Education  2019 [employer].        Ischemic Stroke  An ischemic stroke (cerebrovascular accident, or CVA) is the sudden death of brain tissue that occurs when an area of the brain does not get enough oxygen. It is a medical emergency that must be  treated right away. An ischemic stroke can cause permanent loss of brain function. This can cause problems with how different parts of your body function. What are the causes? This condition is caused by a decrease of oxygen supply to an area of the brain, which may be the result of:  A small blood clot (embolus) or a buildup of plaque in the blood vessels (atherosclerosis) that blocks blood flow in the brain.  An abnormal heart rhythm (atrial fibrillation).  A blocked or damaged artery in the head or neck. Sometimes the cause of stroke is not known (cryptogenic). What increases the risk? Certain factors may make you more likely to develop this condition. Some of these factors are things that you can change, such as:  Obesity.  Smoking cigarettes.  Taking oral birth control, especially if you also use tobacco.  Physical inactivity.  Excessive alcohol use.  Use of illegal drugs, especially cocaine and methamphetamine. Other risk factors include:  High blood pressure (hypertension).  High cholesterol.  Diabetes mellitus.  Heart disease.  Being [place] American, Native American, Hispanic, or [place] Native.  Being over age 85.  Family history of stroke.   Previous history of blood clots, stroke, or transient ischemic attack (TIA).  Sickle cell disease.  Being a woman with a history of preeclampsia.  Migraine headache.  Sleep apnea.  Irregular heartbeats, such as atrial fibrillation.  Chronic inflammatory diseases, such as rheumatoid arthritis or lupus.  Blood clotting disorders (hypercoagulable state). What are the signs or symptoms? Symptoms of this condition usually develop suddenly, or you may notice them after waking up from sleep. Symptoms may include sudden:  Weakness or numbness in your face, arm, or leg, especially on one side of your body.  Trouble walking or difficulty moving your arms or legs.  Loss of balance or coordination.  Confusion.  Slurred speech (dysarthria).  Trouble speaking, understanding speech, or both (aphasia).  Vision changes-such as double vision, blurred vision, or loss of vision-in one or both eyes.  Dizziness.  Nausea and vomiting.  Severe headache with no known cause. The headache is often described as the worst headache ever experienced. If possible, make note of the exact time that you last felt like your normal self and what time your symptoms started. Tell your health care provider. If symptoms come and go, this could be a sign of a warning stroke, or TIA. Get help right away, even if you feel better. How is this diagnosed? This condition may be diagnosed based on:  Your symptoms, your medical history, and a physical exam.  CT scan of the brain.  MRI.  CT angiogram. This test uses a computer to take X-rays of your arteries. A dye may be injected into your blood to show the inside of your blood vessels more clearly.  MRI angiogram. This is a type of MRI that is used to evaluate the blood vessels.  Cerebral angiogram. This test uses X-rays and a dye to show the blood vessels in the brain and neck. You may need to see a health care provider who specializes in stroke care. A stroke  specialist can be seen in person or through communication using telephone or television technology (telemedicine). Other tests may also be done to find the cause of the stroke, such as:  Electrocardiogram (ECG).  Continuous heart monitoring.  Echocardiogram.  Transesophageal echocardiogram (TEE).  Carotid ultrasound.  A scan of the brain circulation.  Blood tests.  Sleep study to check for sleep apnea. How is  this treated? Treatment for this condition will depend on the duration, severity, and cause of your symptoms and on the area of the brain affected. It is very important to get treatment at the first sign of stroke symptoms. Some treatments work better if they are done within 3-6 hours of the onset of stroke symptoms. These initial treatments may include:  Aspirin.  Medicines to control blood pressure.  Medicine given by injection to dissolve the blood clot (thrombolytic).  Treatments given directly to the affected artery to remove or dissolve the blood clot. Other treatment options may include:  Oxygen.  IV fluids.  Medicines to thin the blood (anticoagulants or antiplatelets).  Procedures to increase blood flow. Medicines and changes to your diet may be used to help treat and manage risk factors for stroke, such as diabetes, high cholesterol, and high blood pressure. After a stroke, you may work with physical, speech, mental health, or occupational therapists to help you recover. Follow these instructions at home: Medicines  Take over-the-counter and prescription medicines only as told by your health care provider.  If you were told to take a medicine to thin your blood, such as aspirin or an anticoagulant, take it exactly as told by your health care provider. ? Taking too much blood-thinning medicine can cause bleeding. ? If you do not take enough blood-thinning medicine, you will not have the protection that you need against another stroke and other problems.   Understand the side effects of taking anticoagulant medicine. When taking this type of medicine, make sure you: ? Hold pressure over any cuts for longer than usual. ? Tell your dentist and other health care providers that you are taking anticoagulants before you have any procedures that may cause bleeding. ? Avoid activities that may cause trauma or injury. Eating and drinking  Follow instructions from your health care provider about diet.  Eat healthy foods.  If your ability to swallow was affected by the stroke, you may need to take steps to avoid choking, such as: ? Taking small bites when eating. ? Eating foods that are soft or pureed. Safety  Follow instructions from your health care team about physical activity.  Use a walker or cane as told by your health care provider.  Take steps to create a safe home environment in order to reduce the risk of falls. This may include: ? Having your home looked at by specialists. ? Installing grab bars in the bedroom and bathroom. ? Using safety equipment, such as raised toilets and a seat in the shower. General instructions  Do not use any tobacco products, such as cigarettes, chewing tobacco, and e-cigarettes. If you need help quitting, ask your health care provider.  Limit alcohol intake to no more than 1 drink a day for nonpregnant women and 2 drinks a day for men. One drink equals 12 oz of beer, 5 oz of wine, or 1 oz of hard liquor.  If you need help to stop using drugs or alcohol, ask your health care provider about a referral to a program or specialist.  Maintain an active and healthy lifestyle. Get regular exercise as told by your health care provider.  Keep all follow-up visits as told by your health care provider, including visits with all specialists on your health care team. This is important. How is this prevented? Your risk of another stroke can be decreased by managing high blood pressure, high cholesterol, diabetes, heart  disease, sleep apnea, and obesity. It can also be decreased  by quitting smoking, limiting alcohol, and staying physically active. Your health care provider will continue to work with you on measures to prevent short-term and long-term complications of stroke. Get help right away if:   You have any symptoms of a stroke. "BE FAST" is an easy way to remember the main warning signs of a stroke: ? B - Balance. Signs are dizziness, sudden trouble walking, or loss of balance. ? E - Eyes. Signs are trouble seeing or a sudden change in vision. ? F - Face. Signs are sudden weakness or numbness of the face, or the face or eyelid drooping on one side. ? A - Arms. Signs are weakness or numbness in an arm. This happens suddenly and usually on one side of the body. ? S - Speech. Signs are sudden trouble speaking, slurred speech, or trouble understanding what people say. ? T - Time. Time to call emergency services. Write down what time symptoms started.  You have other signs of a stroke, such as: ? A sudden, severe headache with no known cause. ? Nausea or vomiting. ? Seizure.  These symptoms may represent a serious problem that is an emergency. Do not wait to see if the symptoms will go away. Get medical help right away. Call your local emergency services (911 in the [place] Do not drive yourself to the hospital. Summary  An ischemic stroke (cerebrovascular accident, or CVA) is the sudden death of brain tissue that occurs when an area of the brain does not get enough oxygen.  Symptoms of this condition usually develop suddenly, or you may notice them after waking up from sleep.  It is very important to get treatment at the first sign of stroke symptoms. Stroke is a medical emergency that must be treated right away. This information is not intended to replace advice given to you by your health care provider. Make sure you discuss any questions you have with your health care provider. Document Released:  12/07/2005 Document Revised: 08/26/2018 Document Reviewed: 03/04/2016 Elsevier Interactive Patient Education  2019 [employer].

## 2019-06-01 LAB — VIRUS CULTURE

## 2019-06-01 NOTE — Addendum Note (Signed)
Addended by: Lanier Prude D on: 06/01/2019 06:25 PM   Modules accepted: Orders

## 2019-06-04 LAB — ANAEROBIC AND AEROBIC CULTURE

## 2019-06-06 ENCOUNTER — Other Ambulatory Visit: Payer: Self-pay | Admitting: Family Medicine

## 2019-06-07 ENCOUNTER — Telehealth: Payer: Self-pay | Admitting: Family Medicine

## 2019-06-07 NOTE — Telephone Encounter (Signed)
Patient notified of results. MPulliam, CMA/RT(R)

## 2019-06-07 NOTE — Telephone Encounter (Signed)
Patient left VM returning phone call from [facility] yesterday on recent lab work. She states you can contact her back today after 4:00p (she has a dr appt and PT this afternoon). Please contact at home number.

## 2019-06-15 ENCOUNTER — Other Ambulatory Visit (INDEPENDENT_AMBULATORY_CARE_PROVIDER_SITE_OTHER): Payer: Medicare Other

## 2019-06-15 ENCOUNTER — Other Ambulatory Visit: Payer: Self-pay

## 2019-06-15 DIAGNOSIS — G459 Transient cerebral ischemic attack, unspecified: Secondary | ICD-10-CM

## 2019-06-15 DIAGNOSIS — L988 Other specified disorders of the skin and subcutaneous tissue: Secondary | ICD-10-CM

## 2019-06-15 DIAGNOSIS — R55 Syncope and collapse: Secondary | ICD-10-CM

## 2019-06-15 DIAGNOSIS — R21 Rash and other nonspecific skin eruption: Secondary | ICD-10-CM

## 2019-06-15 DIAGNOSIS — Z8673 Personal history of transient ischemic attack (TIA), and cerebral infarction without residual deficits: Secondary | ICD-10-CM

## 2019-06-15 DIAGNOSIS — L259 Unspecified contact dermatitis, unspecified cause: Secondary | ICD-10-CM

## 2019-06-16 LAB — COMPREHENSIVE METABOLIC PANEL
ALT: 18 IU/L (ref 0–32)
AST: 25 IU/L (ref 0–40)
Albumin/Globulin Ratio: 2 (ref 1.2–2.2)
Albumin: 4.6 g/dL (ref 3.6–4.6)
Alkaline Phosphatase: 51 IU/L (ref 39–117)
BUN/Creatinine Ratio: 10 — ABNORMAL LOW (ref 12–28)
BUN: 12 mg/dL (ref 8–27)
Bilirubin Total: 0.6 mg/dL (ref 0.0–1.2)
CO2: 25 mmol/L (ref 20–29)
Calcium: 9.3 mg/dL (ref 8.7–10.3)
Chloride: 102 mmol/L (ref 96–106)
Creatinine, Ser: 1.19 mg/dL — ABNORMAL HIGH (ref 0.57–1.00)
GFR calc Af Amer: 49 mL/min/{1.73_m2} — ABNORMAL LOW (ref >59)
GFR calc non Af Amer: 42 mL/min/{1.73_m2} — ABNORMAL LOW (ref >59)
Globulin, Total: 2.3 g/dL (ref 1.5–4.5)
Glucose: 111 mg/dL — ABNORMAL HIGH (ref 65–99)
Potassium: 4.5 mmol/L (ref 3.5–5.2)
Sodium: 141 mmol/L (ref 134–144)
Total Protein: 6.9 g/dL (ref 6.0–8.5)

## 2019-06-16 LAB — CBC WITH DIFFERENTIAL/PLATELET
Basophils Absolute: 0.1 10*3/uL (ref 0.0–0.2)
Basos: 2 %
EOS (ABSOLUTE): 0.6 10*3/uL — ABNORMAL HIGH (ref 0.0–0.4)
Eos: 9 %
Hematocrit: 41.5 % (ref 34.0–46.6)
Hemoglobin: 13.7 g/dL (ref 11.1–15.9)
Immature Grans (Abs): 0.1 10*3/uL (ref 0.0–0.1)
Immature Granulocytes: 1 %
Lymphocytes Absolute: 1.4 10*3/uL (ref 0.7–3.1)
Lymphs: 21 %
MCH: 29.6 pg (ref 26.6–33.0)
MCHC: 33 g/dL (ref 31.5–35.7)
MCV: 90 fL (ref 79–97)
Monocytes Absolute: 0.6 10*3/uL (ref 0.1–0.9)
Monocytes: 10 %
Neutrophils Absolute: 3.8 10*3/uL (ref 1.4–7.0)
Neutrophils: 57 %
Platelets: 415 10*3/uL (ref 150–450)
RBC: 4.63 x10E6/uL (ref 3.77–5.28)
RDW: 13.6 % (ref 11.7–15.4)
WBC: 6.6 10*3/uL (ref 3.4–10.8)

## 2019-06-16 LAB — LIPID PANEL
Chol/HDL Ratio: 3.6 ratio (ref 0.0–4.4)
Cholesterol, Total: 266 mg/dL — ABNORMAL HIGH (ref 100–199)
HDL: 73 mg/dL (ref >39)
LDL Calculated: 176 mg/dL — ABNORMAL HIGH (ref 0–99)
Triglycerides: 87 mg/dL (ref 0–149)
VLDL Cholesterol Cal: 17 mg/dL (ref 5–40)

## 2019-06-16 LAB — HEMOGLOBIN A1C
Est. average glucose Bld gHb Est-mCnc: 128 mg/dL
Hgb A1c MFr Bld: 6.1 % — ABNORMAL HIGH (ref 4.8–5.6)

## 2019-06-16 LAB — T4, FREE: Free T4: 1.07 ng/dL (ref 0.82–1.77)

## 2019-06-16 LAB — TSH: TSH: 2.55 u[IU]/mL (ref 0.450–4.500)

## 2019-06-16 LAB — VITAMIN D 25 HYDROXY (VIT D DEFICIENCY, FRACTURES): Vit D, 25-Hydroxy: 104 ng/mL — ABNORMAL HIGH (ref 30.0–100.0)

## 2019-06-20 ENCOUNTER — Other Ambulatory Visit: Payer: Self-pay

## 2019-06-20 ENCOUNTER — Ambulatory Visit: Payer: Medicare Other | Admitting: Family Medicine

## 2019-06-20 ENCOUNTER — Encounter: Payer: Self-pay | Admitting: Family Medicine

## 2019-06-20 VITALS — BP 140/74 | HR 63 | Temp 98.7°F | Ht 61.0 in | Wt 133.0 lb

## 2019-06-20 DIAGNOSIS — E785 Hyperlipidemia, unspecified: Secondary | ICD-10-CM

## 2019-06-20 DIAGNOSIS — E038 Other specified hypothyroidism: Secondary | ICD-10-CM

## 2019-06-20 DIAGNOSIS — Z9861 Coronary angioplasty status: Secondary | ICD-10-CM

## 2019-06-20 DIAGNOSIS — I2510 Atherosclerotic heart disease of native coronary artery without angina pectoris: Secondary | ICD-10-CM

## 2019-06-20 DIAGNOSIS — R7303 Prediabetes: Secondary | ICD-10-CM | POA: Diagnosis not present

## 2019-06-20 DIAGNOSIS — Z8673 Personal history of transient ischemic attack (TIA), and cerebral infarction without residual deficits: Secondary | ICD-10-CM

## 2019-06-20 DIAGNOSIS — R7989 Other specified abnormal findings of blood chemistry: Secondary | ICD-10-CM

## 2019-06-20 DIAGNOSIS — E559 Vitamin D deficiency, unspecified: Secondary | ICD-10-CM

## 2019-06-20 MED ORDER — ROSUVASTATIN CALCIUM 5 MG PO TABS: 5.0000 mg | ORAL_TABLET | Freq: Every day | ORAL | 1 refills | Status: DC

## 2019-06-20 MED ORDER — VITAMIN D (ERGOCALCIFEROL) 1.25 MG (50000 UNIT) PO CAPS: 50000.0000 [IU] | ORAL_CAPSULE | ORAL | 3 refills | Status: DC

## 2019-06-20 NOTE — Progress Notes (Signed)
Assessment and plan:  1. Prediabetes   2. Other specified hypothyroidism   3. Vitamin D deficiency   4. Hyperlipidemia, unspecified hyperlipidemia type   5. History of CVA (cerebrovascular accident)- 2017-     6. Atherosclerosis of native coronary artery of native heart without angina pectoris   7. CAD S/P percutaneous coronary angioplasty   8. Elevated serum creatinine      1. Prediabetes Stable  2. Other specified hypothyroidism Stable  3. Vitamin D deficiency Too high.  Patient will decrease intake- prior prescription given to her by another physician to take vitamin D twice weekly.  I wrote for once weekly.  Will recheck vitamin D 4 months or so - Vitamin D, Ergocalciferol, (DRISDOL) 1.25 MG (50000 UT) CAPS capsule; Take 1 capsule (50,000 Units total) by mouth every 7 (seven) days.  Dispense: 12 capsule; Refill: 3  4. Hyperlipidemia, unspecified hyperlipidemia type Poorly controlled.  Extensive counseling done on diet as well as lifestyle and medication management - rosuvastatin (CRESTOR) 5 MG tablet; Take 1 tablet (5 mg total) by mouth at bedtime.  Dispense: 90 tablet; Refill: 1 - Comprehensive metabolic panel; Future  5. History of CVA (cerebrovascular accident)- 2017-   Reviewed with patient importance of blood pressure and cholesterol control with this history - rosuvastatin (CRESTOR) 5 MG tablet; Take 1 tablet (5 mg total) by mouth at bedtime.  Dispense: 90 tablet; Refill: 1  6. Atherosclerosis of native coronary artery of native heart without angina pectoris - rosuvastatin (CRESTOR) 5 MG tablet; Take 1 tablet (5 mg total) by mouth at bedtime.  Dispense: 90 tablet; Refill: 1  7. CAD S/P percutaneous coronary angioplasty - rosuvastatin (CRESTOR) 5 MG tablet; Take 1 tablet (5 mg total) by mouth at bedtime.  Dispense: 90 tablet; Refill: 1  8. Elevated serum creatinine Discussed with patient chronic  kidney disease and age-related effects on kidneys.  Also discussed ways she can improve this level and avoid nephrotoxic substances etc.  Will recheck in 6 to 8 weeks. - Comprehensive metabolic panel; Future   Education and routine counseling performed. Handouts provided.  Orders Placed This Encounter  Procedures  . Comprehensive metabolic panel    Meds ordered this encounter  Medications  . Vitamin D, Ergocalciferol, (DRISDOL) 1.25 MG (50000 UT) CAPS capsule    Sig: Take 1 capsule (50,000 Units total) by mouth every 7 (seven) days.    Dispense:  12 capsule    Refill:  3  . rosuvastatin (CRESTOR) 5 MG tablet    Sig: Take 1 tablet (5 mg total) by mouth at bedtime.    Dispense:  90 tablet    Refill:  1    Medications Discontinued During This Encounter  Medication Reason  . Vitamin D, Ergocalciferol, (DRISDOL) 1.25 MG (50000 UT) CAPS capsule      Return for bld wrk in 6 wks with OV with me 3-5 days later- started crestor.   Anticipatory guidance and routine counseling done re: condition, txmnt options and need for follow up. All questions of patient's were answered.  Pt was interviewed and evaluated by me in the clinic today for 32.5+ minutes, with over 50% time spent in face to face counseling of patients various medical conditions, treatment plans of those medical conditions including medicine management and lifestyle modification, strategies to improve health and well being; and in coordination of care. SEE ABOVE TREATMENT PLAN FOR DETAILS   Gross side effects, risk and benefits, and alternatives of medications discussed  with patient.  Patient is aware that all medications have potential side effects and we are unable to predict every sideeffect or drug-drug interaction that may occur.  Expresses verbal understanding and consents to current therapy plan and treatment regiment.  Please see AVS handed out to patient at the end of our visit for additional patient instructions/  counseling done pertaining to today's office visit.  Note:  This document was prepared using Dragon voice recognition software and may include unintentional dictation errors.    Kim Lane 06/21/19 7:58 AM  ----------------------------------------------------------------------------------------------------------------------  Subjective:   CC:   Kim Lane is a 85 y.o. female who presents to [place] at [facility] today for review and discussion of recent bloodwork that was done in addition to f/up on chronic conditions we are managing for pt.  1. All recent blood work that we ordered was reviewed with patient today.  Patient was counseled on all abnormalities and we discussed dietary and lifestyle changes that could help those values (also medications when appropriate).  Extensive health counseling performed and all patient's concerns/ questions were addressed.  See labs below and also plan for more details of these abnormalities 2. Patient last saw Dr. Alvan Dame for her lumbar radiculopathy and degenerative lumbar degenerative disc disease back on 05/01/2019.  Patient states she has been there since then though for injections in her back 2 times.  She is having some increased low back pain as well as the nerve tingling and pain in her bilateral lower extremities from her ankles more proximal to the knees.  Patient states the injections were in her back to help that radicular pain but it is unfortunately worse.  She will call them for follow-up 3. Also when I last saw her her right hand skin looked macerated and I recommended treatment.  Today she is completely healed and did what we recommended.    Wt Readings from Last 3 Encounters:  06/20/19 133 lb (60.3 kg)  05/31/19 137 lb 1.6 oz (62.2 kg)  05/05/19 130 lb 4.8 oz (59.1 kg)   BP Readings from Last 3 Encounters:  06/20/19 140/74  05/31/19 130/72  05/05/19 140/73   Pulse Readings from Last 3 Encounters:  06/20/19  63  05/31/19 76  05/05/19 72   BMI Readings from Last 3 Encounters:  06/20/19 25.13 kg/m  05/31/19 25.90 kg/m  05/05/19 22.37 kg/m     Patient Care Team    Relationship Specialty Notifications Start End  Kim Dance, DO PCP - General Family Medicine  07/02/17   Josue Hector, MD PCP - Cardiology Cardiology Admissions 09/16/18   Josue Hector, MD Consulting Physician Cardiology  07/02/17   Penni Bombard, MD Consulting Physician Neurology  07/02/17   Jarome Matin, MD Consulting Physician Dermatology  07/02/17   Rozetta Nunnery, MD Consulting Physician Otolaryngology  07/02/17   Brunetta Genera, MD Consulting Physician Hematology  07/02/17   Melina Schools, MD Consulting Physician Orthopedic Surgery  07/02/17     Full medical history updated and reviewed in the office today  Patient Active Problem List   Diagnosis Date Noted  . Glucose intolerance (impaired glucose tolerance) 11/24/2017    Priority: High  . Hodgkin lymphoma ([place]) 04/26/2017    Priority: High  . Essential hypertension, benign 01/08/2009    Priority: High  . Coronary atherosclerosis- sees Dr. Johnsie Cancel 01/08/2009    Priority: High  . Hypothyroidism 01/08/2009    Priority: High  . Closed compression fracture of  L2 lumbar vertebra 06/24/2017    Priority: Medium  . Symptomatic anemia 04/26/2017    Priority: Medium  . HYPERCHOLESTEROLEMIA, MIXED 01/08/2009    Priority: Medium  . Osteoporosis 07/23/2017    Priority: Low  . CAD S/P percutaneous coronary angioplasty 07/02/2017    Priority: Low  . Prediabetes 06/20/2019  . Hyperlipidemia 06/20/2019  . History of CVA (cerebrovascular accident)- 2017-   05/31/2019  . Syncope and collapse 05/31/2019  . Blistering rash-new onset right palmar surface hand- at base esp btwn fingers 05/31/2019  . Maceration of skin 05/31/2019  . Localized, primary osteoarthritis 12/07/2018  . Cramps, muscle, general-mostly bilateral lower extremities, worse at night  but not just nocturnal 12/07/2018  . Pain in finger of right hand 11/30/2018  . Sleep difficulties 09/11/2018  . Muscle cramps at night 08/10/2018  . Arthritis 08/10/2018  . Dehydration, mild 08/10/2018  . S/P kyphoplasty- dr Rolena Infante- ortho 04/05/2018  . Postmenopausal 04/05/2018  . Closed fracture of multiple ribs 04/05/2018  . Allergic contact dermatitis due to cosmetics 01/26/2018  . Trigger finger, acquired- R 3rd digit 01/26/2018  . Osteoarthritis of finger 01/26/2018  . Vitamin D deficiency 11/24/2017  . Osteoarthritis 09/16/2017  . History of iron deficiency anemia-  by ONC 07/23/2017  . Abnormal nuclear cardiac imaging test 06/16/2017  . Malnutrition of moderate degree 04/27/2017  . Port catheter in place 02/02/2017  . Nodular sclerosis Hodgkin lymphoma of lymph nodes of neck ([place]) 01/13/2017  . Dyspnea 09/14/2012  . Epistaxis 06/02/2011    Past Medical History:  Diagnosis Date  . Anemia   . Anginal pain ([place])    occ  . Blood dyscrasia 12/2016   hodgins lymphoma tx-remission  . CAD (coronary artery disease) 02/2006   Taxus stent placed to LAD and Diagonal per Dr. Olevia Perches  . Cataracts, bilateral   . Complication of anesthesia   . HTN (hypertension)   . Hypercholesterolemia   . Hypothyroidism   . IBS (irritable bowel syndrome)   . Osteoarthritis   . PONV (postoperative nausea and vomiting)   . Stroke [facility]) 03/2016    Past Surgical History:  Procedure Laterality Date  . APPENDECTOMY    . IR GENERIC HISTORICAL  01/25/2017   IR US GUIDE VASC ACCESS RIGHT 01/25/2017 [name], MD [facility]-INTER[facility] RAD  . IR GENERIC HISTORICAL  01/25/2017   IR FLUORO GUIDE PORT INSERTION RIGHT 01/25/2017 [name], MD [facility]  . IR REMOVAL TUN ACCESS W/ PORT W/O FL MOD SED  10/28/2017  . KYPHOPLASTY N/A 06/24/2017   Procedure: KYPHOPLASTY T10;  Surgeon: Melina Schools, MD;  Location: [place];  Service: Orthopedics;  Laterality: N/A;  90 mins  . LEFT HEART CATH AND CORONARY ANGIOGRAPHY  N/A 06/16/2017   Procedure: Left Heart Cath and Coronary Angiography;  Surgeon: Sherren Mocha, MD;  Location: [place] [facility];  Service: Cardiovascular;  Laterality: N/A;  . MASS EXCISION Right 12/22/2016   Procedure: RIGHT NECK LYMPH NODE BIOPSY;  Surgeon: Rozetta Nunnery, MD;  Location: [facility];  Service: ENT;  Laterality: Right;  . SPINE SURGERY    . stents     in heart  . TONSILLECTOMY    . TOTAL KNEE ARTHROPLASTY Bilateral 2011,2012  . VAGINAL HYSTERECTOMY      Social History   Tobacco Use  . Smoking status: Former Research scientist (life sciences)  . Smokeless tobacco: Never Used  . Tobacco comment: quit smoking 30 years ago, very light smoker  Substance Use Topics  . Alcohol use: No  Alcohol/week: 0.0 standard drinks    Family Hx: Family History  Problem Relation Age of Onset  . Diabetes Mother   . Dementia Mother   . ALS Mother   . Heart Problems Father   . Liver disease Sister   . Cirrhosis Sister   . Heart block Unknown        CABG  . Diabetes Sister   . Heart block Son        CABG  . Other [place]  . Other Daughter        [place]  . Dementia Sister   . Breast cancer Neg Hx      Medications: Current Outpatient Medications  Medication Sig Dispense Refill  . aspirin 81 MG tablet Take 81 mg by mouth every other day.     . carvedilol (COREG) 3.125 MG tablet TAKE 1 TABLET BY MOUTH EVERY OTHER MORNING 45 tablet 1  . Coenzyme Q10 100 MG capsule Take 100 mg by mouth daily.     . diclofenac sodium (VOLTAREN) 1 % GEL Apply 2 g topically 4 (four) times daily. To affected joint as needed only (substitution cream is okay) 100 g 1  . levothyroxine (SYNTHROID) 88 MCG tablet TAKE 1 TABLET BY MOUTH DAILY BEFORE BREAKFAST 90 tablet 0  . Melatonin 2.5 MG CAPS Take 1 capsule by mouth at bedtime.    . meloxicam (MOBIC) 7.5 MG tablet Take 1 tablet (7.5 mg total) by mouth daily. As needed joint/ arthritis pain 30 tablet 1  . Multiple Vitamins-Minerals  (CENTRUM SILVER PO) Take 1 tablet by mouth daily.      . nitroGLYCERIN (NITROLINGUAL) 0.4 MG/SPRAY spray Place 1 spray under the tongue every 5 (five) minutes x 3 doses as needed for chest pain (3 sprays max).    . Omega-3 Fatty Acids (FISH OIL) 1000 MG CAPS Take 1 capsule by mouth daily.     [name] Kitchen PROAIR HFA 108 (90 Base) MCG/ACT inhaler Inhale 2 puffs into the lungs every 4 (four) hours as needed for shortness of breath.     . RESTASIS 0.05 % ophthalmic emulsion Place 2 drops into both eyes 2 (two) times daily. For dry eyes    . triamcinolone cream (KENALOG) 0.1 % Apply 1 application topically 2 (two) times daily. 60 g 0  . Vitamin D, Ergocalciferol, (DRISDOL) 1.25 MG (50000 UT) CAPS capsule Take 1 capsule (50,000 Units total) by mouth every 7 (seven) days. 12 capsule 3  . rosuvastatin (CRESTOR) 5 MG tablet Take 1 tablet (5 mg total) by mouth at bedtime. 90 tablet 1   No current facility-administered medications for this visit.     Allergies:  Allergies  Allergen Reactions  . Fosamax [Alendronate Sodium] Other (See Comments)    "made me ache all over"     Review of Systems: General:   No F/C, wt loss Pulm:   No DIB, SOB, pleuritic chest pain Card:  No CP, palpitations Abd:  No n/v/d or pain Ext:  No inc edema from baseline  Objective:  Blood pressure 140/74, pulse 63, temperature 98.7 F (37.1 C), height 5\' 1"  (1.549 m), weight 133 lb (60.3 kg), SpO2 99 %. Body mass index is 25.13 kg/m. Gen:   Well NAD, A and O *3 HEENT:    [place]/AT, EOMI,  MMM Lungs:   Normal work of breathing. CTA B/L, no Wh, rhonchi Heart:   RRR, S1, S2 WNL's, no MRG Abd:   No  gross distention Exts:    warm, pink,  Brisk capillary refill, warm and well perfused.  Psych:    No HI/SI, judgement and insight good, Euthymic mood. Full Affect.   Recent Results (from the past 2160 hour(s))  CBC with Differential/Platelet     Status: Abnormal   Collection Time: 05/05/19  9:31 AM  Result Value Ref Range   WBC 9.3  4.0 - 10.5 K/uL   RBC 4.73 3.87 - 5.11 MIL/uL   Hemoglobin 13.8 12.0 - 15.0 g/dL   HCT 42.8 36.0 - 46.0 %   MCV 90.5 80.0 - 100.0 fL   MCH 29.2 26.0 - 34.0 pg   MCHC 32.2 30.0 - 36.0 g/dL   RDW 14.6 11.5 - 15.5 %   Platelets 406 (H) 150 - 400 K/uL   nRBC 0.0 0.0 - 0.2 %   Neutrophils Relative % 58 %   Neutro Abs 5.4 1.7 - 7.7 K/uL   Lymphocytes Relative 24 %   Lymphs Abs 2.2 0.7 - 4.0 K/uL   Monocytes Relative 10 %   Monocytes Absolute 1.0 0.1 - 1.0 K/uL   Eosinophils Relative 5 %   Eosinophils Absolute 0.5 0.0 - 0.5 K/uL   Basophils Relative 2 %   Basophils Absolute 0.2 (H) 0.0 - 0.1 K/uL   Immature Granulocytes 1 %   Abs Immature Granulocytes 0.12 (H) 0.00 - 0.07 K/uL    Comment: Performed at [facility], [address] [place], [place] [address]  CMP ([place] only)     Status: Abnormal   Collection Time: 05/05/19  9:31 AM  Result Value Ref Range   Sodium 139 135 - 145 mmol/L   Potassium 4.2 3.5 - 5.1 mmol/L   Chloride 102 98 - 111 mmol/L   CO2 29 22 - 32 mmol/L   Glucose, Bld 89 70 - 99 mg/dL   BUN 25 (H) 8 - 23 mg/dL   Creatinine 1.05 (H) 0.44 - 1.00 mg/dL   Calcium 9.5 8.9 - 10.3 mg/dL   Total Protein 7.8 6.5 - 8.1 g/dL   Albumin 4.3 3.5 - 5.0 g/dL   AST 24 15 - 41 U/L   ALT 23 0 - 44 U/L   Alkaline Phosphatase 60 38 - 126 U/L   Total Bilirubin 0.4 0.3 - 1.2 mg/dL   GFR, Est Non Af Am 49 (L) >60 mL/min   GFR, Est AFR Am 57 (L) >60 mL/min   Anion gap 8 5 - 15    Comment: Performed at [facility], [address], [place], [place] [address]  Sedimentation rate     Status: None   Collection Time: 05/05/19  9:31 AM  Result Value Ref Range   Sed Rate 11 0 - 22 mm/hr    Comment: Performed at [facility], [place] [address] [place], [place] [address]  Anaerobic and Aerobic Culture     Status: None   Collection Time: 05/31/19  4:45 PM   Specimen: Sterile Swab   STERILE SWAB  Result Value Ref  Range   Anaerobic Culture Final report    Result 1 Comment     Comment: No anaerobic growth in 72 hours.   Aerobic Culture Final report    Result 1 Comment     Comment: No growth in 36 - 48 hours.  Virus culture     Status: None   Collection Time: 05/31/19  4:45 PM   Specimen: Sterile Swab   STERILE  SWAB  Result Value Ref Range   Viral Culture: CANCELED     Comment: Test Not Performed.  This specimen was received in an expired collection/transport device.  It is impossible to predict or validate the performance characteristics of the transport device past the stated expiration date.              utm expired 1/20  Result canceled by the ancillary.   TSH     Status: None   Collection Time: 06/15/19  9:06 AM  Result Value Ref Range   TSH 2.550 0.450 - 4.500 uIU/mL  VITAMIN D 25 Hydroxy (Vit-D Deficiency, Fractures)     Status: Abnormal   Collection Time: 06/15/19  9:06 AM  Result Value Ref Range   Vit D, 25-Hydroxy 104.0 (H) 30.0 - 100.0 ng/mL    Comment: Vitamin D deficiency has been defined by the [place] and an Endocrine Society practice guideline as a level of serum 25-OH vitamin D less than 20 ng/mL (1,2). The Endocrine Society went on to further define vitamin D insufficiency as a level between 21 and 29 ng/mL (2). 1. IOM (Institute of Medicine). 2010. Dietary reference    intakes for calcium and D. [place]: The    [employer]. 2. Holick MF, Binkley [place], Bischoff-Ferrari HA, et al.    Evaluation, treatment, and prevention of vitamin D    deficiency: an Endocrine Society clinical practice    guideline. JCEM. 2011 Jul; 96(7):1911-30.   Lipid panel     Status: Abnormal   Collection Time: 06/15/19  9:06 AM  Result Value Ref Range   Cholesterol, Total 266 (H) 100 - 199 mg/dL   Triglycerides 87 0 - 149 mg/dL   HDL 73 >39 mg/dL   VLDL Cholesterol Cal 17 5 - 40 mg/dL   LDL Calculated 176 (H) 0 - 99 mg/dL   Chol/HDL Ratio 3.6 0.0 - 4.4 ratio     Comment:                                   T. Chol/HDL Ratio                                             Lane  Women                               1/2 Avg.Risk  3.4    3.3                                   Avg.Risk  5.0    4.4                                2X Avg.Risk  9.6    7.1                                3X Avg.Risk 23.4   11.0   T4, free     Status: None   Collection Time: 06/15/19  9:06 AM  Result Value Ref Range   Free  T4 1.07 0.82 - 1.77 ng/dL  CBC with Differential/Platelet     Status: Abnormal   Collection Time: 06/15/19  9:06 AM  Result Value Ref Range   WBC 6.6 3.4 - 10.8 x10E3/uL   RBC 4.63 3.77 - 5.28 x10E6/uL   Hemoglobin 13.7 11.1 - 15.9 g/dL   Hematocrit 41.5 34.0 - 46.6 %   MCV 90 79 - 97 fL   MCH 29.6 26.6 - 33.0 pg   MCHC 33.0 31.5 - 35.7 g/dL   RDW 13.6 11.7 - 15.4 %   Platelets 415 150 - 450 x10E3/uL   Neutrophils 57 Not Estab. %   Lymphs 21 Not Estab. %   Monocytes 10 Not Estab. %   Eos 9 Not Estab. %   Basos 2 Not Estab. %   Neutrophils Absolute 3.8 1.4 - 7.0 x10E3/uL   Lymphocytes Absolute 1.4 0.7 - 3.1 x10E3/uL   Monocytes Absolute 0.6 0.1 - 0.9 x10E3/uL   EOS (ABSOLUTE) 0.6 (H) 0.0 - 0.4 x10E3/uL   Basophils Absolute 0.1 0.0 - 0.2 x10E3/uL   Immature Granulocytes 1 Not Estab. %   Immature Grans (Abs) 0.1 0.0 - 0.1 x10E3/uL  Comprehensive metabolic panel     Status: Abnormal   Collection Time: 06/15/19  9:06 AM  Result Value Ref Range   Glucose 111 (H) 65 - 99 mg/dL   BUN 12 8 - 27 mg/dL   Creatinine, Ser 1.19 (H) 0.57 - 1.00 mg/dL   GFR calc non Af Amer 42 (L) >59 mL/min/1.73   GFR calc Af Amer 49 (L) >59 mL/min/1.73   BUN/Creatinine Ratio 10 (L) 12 - 28   Sodium 141 134 - 144 mmol/L   Potassium 4.5 3.5 - 5.2 mmol/L   Chloride 102 96 - 106 mmol/L   CO2 25 20 - 29 mmol/L   Calcium 9.3 8.7 - 10.3 mg/dL   Total Protein 6.9 6.0 - 8.5 g/dL   Albumin 4.6 3.6 - 4.6 g/dL   Globulin, Total 2.3 1.5 - 4.5 g/dL   Albumin/Globulin Ratio 2.0 1.2  - 2.2   Bilirubin Total 0.6 0.0 - 1.2 mg/dL   Alkaline Phosphatase 51 39 - 117 IU/L   AST 25 0 - 40 IU/L   ALT 18 0 - 32 IU/L  Hemoglobin A1c     Status: Abnormal   Collection Time: 06/15/19  9:06 AM  Result Value Ref Range   Hgb A1c MFr Bld 6.1 (H) 4.8 - 5.6 %    Comment:          Prediabetes: 5.7 - 6.4          Diabetes: >6.4          Glycemic control for adults with diabetes: <7.0    Est. average glucose Bld gHb Est-mCnc 128 mg/dL

## 2019-06-20 NOTE — Patient Instructions (Addendum)
Because restarting you on the Crestor in 6wks- come in for repeat of your cmp to make sure they look okay.     Also we will recheck your cholesterol in 4 months after you are on the medicine continuously   Risk factors for prediabetes and type 2 diabetes  Researchers don't fully understand why some people develop prediabetes and type 2 diabetes and others don't.  It's clear that certain factors increase the risk, however, including:  Weight. The more fatty tissue you have, the more resistant your cells become to insulin.  Inactivity. The less active you are, the greater your risk. Physical activity helps you control your weight, uses up glucose as energy and makes your cells more sensitive to insulin.  Family history. Your risk increases if a parent or sibling has type 2 diabetes.  Race. Although it's unclear why, people of certain races - including blacks, Hispanics, American Indians and Asian-Americans - are at higher risk.  Age. Your risk increases as you get older. This may be because you tend to exercise less, lose muscle mass and gain weight as you age. But type 2 diabetes is also increasing dramatically among children, adolescents and younger adults.  Gestational diabetes. If you developed gestational diabetes when you were pregnant, your risk of developing prediabetes and type 2 diabetes later increases. If you gave birth to a baby weighing more than 9 pounds (4 kilograms), you're also at risk of type 2 diabetes.  Polycystic ovary syndrome. For women, having polycystic ovary syndrome - a common condition characterized by irregular menstrual periods, excess hair growth and obesity - increases the risk of diabetes.  High blood pressure. Having blood pressure over 140/90 millimeters of mercury (mm Hg) is linked to an increased risk of type 2 diabetes.  Abnormal cholesterol and triglyceride levels. If you have low levels of high-density lipoprotein (HDL), or "good," cholesterol, your risk of type  2 diabetes is higher. Triglycerides are another type of fat carried in the blood. People with high levels of triglycerides have an increased risk of type 2 diabetes. Your doctor can let you know what your cholesterol and triglyceride levels are.  A good guide to good carbs: The glycemic index ---If you have diabetes, or at risk for diabetes, you know all too well that when you eat carbohydrates, your blood sugar goes up. The total amount of carbs you consume at a meal or in a snack mostly determines what your blood sugar will do. But the food itself also plays a role. A serving of white rice has almost the same effect as eating pure table sugar - a quick, high spike in blood sugar. A serving of lentils has a slower, smaller effect.  ---Picking good sources of carbs can help you control your blood sugar and your weight. Even if you don't have diabetes, eating healthier carbohydrate-rich foods can help ward off a host of chronic conditions, from heart disease to various cancers to, well, diabetes.  ---One way to choose foods is with the glycemic index (GI). This tool measures how much a food boosts blood sugar.  The glycemic index rates the effect of a specific amount of a food on blood sugar compared with the same amount of pure glucose. A food with a glycemic index of 28 boosts blood sugar only 28% as much as pure glucose. One with a GI of 95 acts like pure glucose.    High glycemic foods result in a quick spike in insulin and blood sugar (also  known as blood glucose).  Low glycemic foods have a slower, smaller effect- these are healthier for you.   Using the glycemic index Using the glycemic index is easy: choose foods in the low GI category instead of those in the high GI category (see below), and go easy on those in between. Low glycemic index (GI of 55 or less): Most fruits and vegetables, beans, minimally processed grains, pasta, low-fat dairy foods, and nuts.  Moderate glycemic index (GI 56 to  69): White and sweet potatoes, corn, white rice, couscous, breakfast cereals such as Cream of Wheat and Mini Wheats.  High glycemic index (GI of 70 or higher): White bread, rice cakes, most crackers, bagels, cakes, doughnuts, croissants, most packaged breakfast cereals. You can see the values for 100 commons foods and get links to more at www.[email or web address].si.  Swaps for lowering glycemic index  Instead of this high-glycemic index food Eat this lower-glycemic index food  White rice Brown rice or converted rice  Instant oatmeal Steel-cut oats  Cornflakes Bran flakes  Baked potato Pasta, bulgur  White bread Whole-grain bread  Corn Peas or leafy greens       Prediabetes Eating Plan  Prediabetes--also called impaired glucose tolerance or impaired fasting glucose--is a condition that causes blood sugar (blood glucose) levels to be higher than normal. Following a healthy diet can help to keep prediabetes under control. It can also help to lower the risk of type 2 diabetes and heart disease, which are increased in people who have prediabetes. Along with regular exercise, a healthy diet:  Promotes weight loss.  Helps to control blood sugar levels.  Helps to improve the way that the body uses insulin.   WHAT DO I NEED TO KNOW ABOUT THIS EATING PLAN?   Use the glycemic index (GI) to plan your meals. The index tells you how quickly a food will raise your blood sugar. Choose low-GI foods. These foods take a longer time to raise blood sugar.  Pay close attention to the amount of carbohydrates in the food that you eat. Carbohydrates increase blood sugar levels.  Keep track of how many calories you take in. Eating the right amount of calories will help you to achieve a healthy weight. Losing about 7 percent of your starting weight can help to prevent type 2 diabetes.  You may want to follow a Mediterranean diet. This diet includes a lot of vegetables, lean meats or fish, whole  grains, fruits, and healthy oils and fats.   WHAT FOODS CAN I EAT?  Grains Whole grains, such as whole-wheat or whole-grain breads, crackers, cereals, and pasta. Unsweetened oatmeal. Bulgur. Barley. Quinoa. Brown rice. Corn or whole-wheat flour tortillas or taco shells. Vegetables Lettuce. Spinach. Peas. Beets. Cauliflower. Cabbage. Broccoli. Carrots. Tomatoes. Squash. Eggplant. Herbs. Peppers. Onions. Cucumbers. Brussels sprouts. Fruits Berries. Bananas. Apples. Oranges. Grapes. Papaya. Mango. Pomegranate. Kiwi. Grapefruit. Cherries. Meats and Other Protein Sources Seafood. Lean meats, such as chicken and [place] or lean cuts of pork and beef. Tofu. Eggs. Nuts. Beans. Dairy Low-fat or fat-free dairy products, such as yogurt, cottage cheese, and cheese. Beverages Water. Tea. Coffee. Sugar-free or diet soda. Seltzer water. Milk. Milk alternatives, such as soy or almond milk. Condiments Mustard. Relish. Low-fat, low-sugar ketchup. Low-fat, low-sugar barbecue sauce. Low-fat or fat-free mayonnaise. Sweets and Desserts Sugar-free or low-fat pudding. Sugar-free or low-fat ice cream and other frozen treats. Fats and Oils Avocado. Walnuts. Olive oil. The items listed above may not be a complete list of recommended foods or beverages.  Contact your dietitian for more options.    WHAT FOODS ARE NOT RECOMMENDED?  Grains Refined white flour and flour products, such as bread, pasta, snack foods, and cereals. Beverages Sweetened drinks, such as sweet iced tea and soda. Sweets and Desserts Baked goods, such as cake, cupcakes, pastries, cookies, and cheesecake. The items listed above may not be a complete list of foods and beverages to avoid. Contact your dietitian for more information.   This information is not intended to replace advice given to you by your health care provider. Make sure you discuss any questions you have with your health care provider.   Document Released: 04/23/2015 Document  Reviewed: 04/23/2015 Elsevier Interactive Patient Education 2016 [place] for a Low Cholesterol, Low Saturated Fat Diet  Fats - Limit total intake of fats and oils. - Avoid butter, stick margarine, shortening, lard, palm and coconut oils. - Limit mayonnaise, salad dressings, gravies and sauces, unless they are homemade with low-fat ingredients. - Limit chocolate. - Choose low-fat and nonfat products, such as low-fat mayonnaise, low-fat or non-hydrogenated peanut butter, low-fat or fat-free salad dressings and nonfat gravy. - Use vegetable oil, such as canola or olive oil. - Look for margarine that does not contain trans fatty acids. - Use nuts in moderate amounts. - Read ingredient labels carefully to determine both amount and type of fat present in foods. Limit saturated and trans fats! - Avoid high-fat processed and convenience foods.  Meats and Meat Alternatives - Choose fish, chicken, [place] and lean meats. - Use dried beans, peas, lentils and tofu. - Limit egg yolks to three to four per week. - If you eat red meat, limit to no more than three servings per week and choose loin or round cuts. - Avoid fatty meats, such as bacon, sausage, franks, luncheon meats and ribs. - Avoid all organ meats, including liver.  Dairy - Choose nonfat or low-fat milk, yogurt and cottage cheese. - Most cheeses are high in fat. Choose cheeses made from non-fat milk, such as mozzarella and ricotta cheese. - Choose light or fat-free cream cheese and sour cream. - Avoid cream and sauces made with cream.  Fruits and Vegetables - Eat a wide variety of fruits and vegetables. - Use lemon juice, vinegar or "mist" olive oil on vegetables. - Avoid adding sauces, fat or oil to vegetables.  Breads, Cereals and Grains - Choose whole-grain breads, cereals, pastas and rice. - Avoid high-fat snack foods, such as granola, cookies, pies, pastries, doughnuts and croissants.  Cooking  Tips - Avoid deep fried foods. - Trim visible fat off meats and remove skin from poultry before cooking. - Bake, broil, boil, poach or roast poultry, fish and lean meats. - Drain and discard fat that drains out of meat as you cook it. - Add little or no fat to foods. - Use vegetable oil sprays to grease pans for cooking or baking. - Steam vegetables. - Use herbs or no-oil marinades to flavor foods.

## 2019-06-30 ENCOUNTER — Telehealth: Payer: Self-pay | Admitting: *Deleted

## 2019-06-30 NOTE — Telephone Encounter (Signed)
Patient called to ask if Dr. [name] thought her Prolia injection was important enough for her to come to the [facility] during this time (COVID 19) because she was trying to decide if she wanted to come. Dr. [name] asked that she be informed that while she could change the appointment for further out, she is at high risk for fracture - especially in her back. Patient contacted and given that information. Patient states she will come in for injection.

## 2019-07-03 ENCOUNTER — Other Ambulatory Visit: Payer: Self-pay | Admitting: Hematology

## 2019-07-05 ENCOUNTER — Ambulatory Visit: Payer: Medicare Other

## 2019-07-05 ENCOUNTER — Other Ambulatory Visit: Payer: Self-pay

## 2019-07-05 ENCOUNTER — Encounter (INDEPENDENT_AMBULATORY_CARE_PROVIDER_SITE_OTHER): Payer: Self-pay

## 2019-07-05 ENCOUNTER — Inpatient Hospital Stay: Payer: Medicare Other

## 2019-07-05 ENCOUNTER — Inpatient Hospital Stay: Payer: Medicare Other | Attending: Hematology

## 2019-07-05 VITALS — BP 128/72 | HR 68 | Temp 98.2°F | Resp 17

## 2019-07-05 DIAGNOSIS — Z8249 Family history of ischemic heart disease and other diseases of the circulatory system: Secondary | ICD-10-CM | POA: Diagnosis not present

## 2019-07-05 DIAGNOSIS — Z87891 Personal history of nicotine dependence: Secondary | ICD-10-CM | POA: Diagnosis not present

## 2019-07-05 DIAGNOSIS — C8111 Nodular sclerosis classical Hodgkin lymphoma, lymph nodes of head, face, and neck: Secondary | ICD-10-CM

## 2019-07-05 DIAGNOSIS — Z820 Family history of epilepsy and other diseases of the nervous system: Secondary | ICD-10-CM | POA: Insufficient documentation

## 2019-07-05 DIAGNOSIS — Z79899 Other long term (current) drug therapy: Secondary | ICD-10-CM | POA: Insufficient documentation

## 2019-07-05 DIAGNOSIS — I10 Essential (primary) hypertension: Secondary | ICD-10-CM | POA: Diagnosis not present

## 2019-07-05 DIAGNOSIS — Z833 Family history of diabetes mellitus: Secondary | ICD-10-CM | POA: Diagnosis not present

## 2019-07-05 DIAGNOSIS — M810 Age-related osteoporosis without current pathological fracture: Secondary | ICD-10-CM | POA: Diagnosis not present

## 2019-07-05 DIAGNOSIS — E039 Hypothyroidism, unspecified: Secondary | ICD-10-CM | POA: Insufficient documentation

## 2019-07-05 DIAGNOSIS — E785 Hyperlipidemia, unspecified: Secondary | ICD-10-CM | POA: Diagnosis not present

## 2019-07-05 DIAGNOSIS — D649 Anemia, unspecified: Secondary | ICD-10-CM | POA: Diagnosis not present

## 2019-07-05 DIAGNOSIS — Z95828 Presence of other vascular implants and grafts: Secondary | ICD-10-CM

## 2019-07-05 LAB — CBC WITH DIFFERENTIAL (CANCER CENTER ONLY)
Abs Immature Granulocytes: 0.04 10*3/uL (ref 0.00–0.07)
Basophils Absolute: 0.1 10*3/uL (ref 0.0–0.1)
Basophils Relative: 2 %
Eosinophils Absolute: 0.7 10*3/uL — ABNORMAL HIGH (ref 0.0–0.5)
Eosinophils Relative: 11 %
HCT: 38.3 % (ref 36.0–46.0)
Hemoglobin: 12.3 g/dL (ref 12.0–15.0)
Immature Granulocytes: 1 %
Lymphocytes Relative: 17 %
Lymphs Abs: 1.1 10*3/uL (ref 0.7–4.0)
MCH: 29.6 pg (ref 26.0–34.0)
MCHC: 32.1 g/dL (ref 30.0–36.0)
MCV: 92.1 fL (ref 80.0–100.0)
Monocytes Absolute: 0.6 10*3/uL (ref 0.1–1.0)
Monocytes Relative: 10 %
Neutro Abs: 4 10*3/uL (ref 1.7–7.7)
Neutrophils Relative %: 59 %
Platelet Count: 350 10*3/uL (ref 150–400)
RBC: 4.16 MIL/uL (ref 3.87–5.11)
RDW: 14.4 % (ref 11.5–15.5)
WBC Count: 6.7 10*3/uL (ref 4.0–10.5)
nRBC: 0 % (ref 0.0–0.2)

## 2019-07-05 LAB — CMP (CANCER CENTER ONLY)
ALT: 17 U/L (ref 0–44)
AST: 22 U/L (ref 15–41)
Albumin: 3.9 g/dL (ref 3.5–5.0)
Alkaline Phosphatase: 43 U/L (ref 38–126)
Anion gap: 10 (ref 5–15)
BUN: 19 mg/dL (ref 8–23)
CO2: 25 mmol/L (ref 22–32)
Calcium: 8.7 mg/dL — ABNORMAL LOW (ref 8.9–10.3)
Chloride: 103 mmol/L (ref 98–111)
Creatinine: 0.97 mg/dL (ref 0.44–1.00)
GFR, Est AFR Am: >60 mL/min (ref >60)
GFR, Estimated: 54 mL/min — ABNORMAL LOW (ref >60)
Glucose, Bld: 110 mg/dL — ABNORMAL HIGH (ref 70–99)
Potassium: 4.4 mmol/L (ref 3.5–5.1)
Sodium: 138 mmol/L (ref 135–145)
Total Bilirubin: 0.6 mg/dL (ref 0.3–1.2)
Total Protein: 6.8 g/dL (ref 6.5–8.1)

## 2019-07-05 LAB — SEDIMENTATION RATE: Sed Rate: 10 mm/hr (ref 0–22)

## 2019-07-05 MED ORDER — DENOSUMAB 60 MG/ML ~~LOC~~ SOSY
60.0000 mg | PREFILLED_SYRINGE | Freq: Once | SUBCUTANEOUS | Status: AC
  Administered 2019-07-05: 60 mg via SUBCUTANEOUS

## 2019-07-05 NOTE — Patient Instructions (Signed)

## 2019-07-05 NOTE — Progress Notes (Signed)
Pt Calcium is 8.7 today ok to give prolia per Dr. [name]

## 2019-07-18 ENCOUNTER — Institutional Professional Consult (permissible substitution): Payer: Medicare Other | Admitting: Diagnostic Neuroimaging

## 2019-07-31 ENCOUNTER — Other Ambulatory Visit (INDEPENDENT_AMBULATORY_CARE_PROVIDER_SITE_OTHER): Payer: Medicare Other

## 2019-07-31 ENCOUNTER — Other Ambulatory Visit: Payer: Self-pay

## 2019-07-31 DIAGNOSIS — R7989 Other specified abnormal findings of blood chemistry: Secondary | ICD-10-CM

## 2019-07-31 DIAGNOSIS — E785 Hyperlipidemia, unspecified: Secondary | ICD-10-CM

## 2019-08-01 LAB — COMPREHENSIVE METABOLIC PANEL
ALT: 17 IU/L (ref 0–32)
AST: 25 IU/L (ref 0–40)
Albumin/Globulin Ratio: 2 (ref 1.2–2.2)
Albumin: 4.4 g/dL (ref 3.6–4.6)
Alkaline Phosphatase: 51 IU/L (ref 39–117)
BUN/Creatinine Ratio: 14 (ref 12–28)
BUN: 15 mg/dL (ref 8–27)
Bilirubin Total: 0.5 mg/dL (ref 0.0–1.2)
CO2: 25 mmol/L (ref 20–29)
Calcium: 9.2 mg/dL (ref 8.7–10.3)
Chloride: 104 mmol/L (ref 96–106)
Creatinine, Ser: 1.07 mg/dL — ABNORMAL HIGH (ref 0.57–1.00)
GFR calc Af Amer: 55 mL/min/{1.73_m2} — ABNORMAL LOW (ref >59)
GFR calc non Af Amer: 48 mL/min/{1.73_m2} — ABNORMAL LOW (ref >59)
Globulin, Total: 2.2 g/dL (ref 1.5–4.5)
Glucose: 111 mg/dL — ABNORMAL HIGH (ref 65–99)
Potassium: 4.4 mmol/L (ref 3.5–5.2)
Sodium: 144 mmol/L (ref 134–144)
Total Protein: 6.6 g/dL (ref 6.0–8.5)

## 2019-08-03 ENCOUNTER — Other Ambulatory Visit: Payer: Self-pay

## 2019-08-03 ENCOUNTER — Ambulatory Visit: Payer: Medicare Other | Admitting: Family Medicine

## 2019-08-03 ENCOUNTER — Encounter: Payer: Self-pay | Admitting: Family Medicine

## 2019-08-03 VITALS — BP 123/77 | HR 68 | Temp 98.5°F | Ht 61.0 in | Wt 132.9 lb

## 2019-08-03 DIAGNOSIS — M5136 Other intervertebral disc degeneration, lumbar region: Secondary | ICD-10-CM

## 2019-08-03 DIAGNOSIS — R7989 Other specified abnormal findings of blood chemistry: Secondary | ICD-10-CM | POA: Diagnosis not present

## 2019-08-03 DIAGNOSIS — Z8673 Personal history of transient ischemic attack (TIA), and cerebral infarction without residual deficits: Secondary | ICD-10-CM | POA: Diagnosis not present

## 2019-08-03 DIAGNOSIS — E785 Hyperlipidemia, unspecified: Secondary | ICD-10-CM | POA: Diagnosis not present

## 2019-08-03 DIAGNOSIS — G5793 Unspecified mononeuropathy of bilateral lower limbs: Secondary | ICD-10-CM

## 2019-08-03 DIAGNOSIS — R7303 Prediabetes: Secondary | ICD-10-CM

## 2019-08-03 DIAGNOSIS — E559 Vitamin D deficiency, unspecified: Secondary | ICD-10-CM

## 2019-08-03 DIAGNOSIS — M51369 Other intervertebral disc degeneration, lumbar region without mention of lumbar back pain or lower extremity pain: Secondary | ICD-10-CM

## 2019-08-03 DIAGNOSIS — I10 Essential (primary) hypertension: Secondary | ICD-10-CM

## 2019-08-03 NOTE — Progress Notes (Signed)
Assessment and plan:  1. Hyperlipidemia, unspecified hyperlipidemia type   2. History of CVA (cerebrovascular accident)- 2017-     3. Elevated serum creatinine   4. Prediabetes   5. Vitamin D deficiency   6. Essential hypertension, benign   7. Degenerative disc disease, lumbar- MRI 6/ 2018   8. Neuropathy involving both lower extremities- due to DDD of L-Spine      Hyperlipidemia, unspecified hyperlipidemia type  History of CVA (cerebrovascular accident)- 2017-    Elevated serum creatinine  Prediabetes  Vitamin D deficiency  Essential hypertension, benign  Degenerative disc disease, lumbar- MRI 6/ 2018  Neuropathy involving both lower extremities- due to DDD of L-Spine    Hyperlipidemia - Managed on Statin - Went over labs in June and started on Crestor in June 2020. - Discussed that it will take 4 months to reflect in the labs  - Patient tolerating meds well.  Denies S-E. - Continue statin as prescribed.  See med list.  - Education provided today and questions were answered. - Will continue to monitor and re-check as recommended.   Prediabetes - Stable last check. - No change in management today. - Will continue to monitor. Lab Results  Component Value Date   HGBA1C 6.1 (H) 06/15/2019   HGBA1C 6.0 (H) 10/07/2018   HGBA1C 5.8 (A) 08/10/2018    Vitamin D Deficiency - Stable last check. - No change in management today. - Continue to take supplementation as prescribed, decreased from prior.  See med list. - Will continue to monitor.   Essential Hypertension - Stable at this time. - No changes to treatment plan made today. - Continue management as prescribed.  See med list. - Will continue to monitor.   Neuropathy of Bilateral LE - Degenerative Disc Disease  - On 03/21/2019, pt had vascular studies on LE through cardiology. - At that time, patient's circulation was found to be WNL.  - Extensively reviewed findings from cardiology with patient today. - Discussed and advised patient regarding sx during appointment.  - Reviewed that patient's kyphoplasty was done 06/24/2017. - Discussed that patient's sx are likely coming from pinched nerve in her back.  - imaging and chart reviewed/ speciality notes - MRI obtained 6 of 2018 of L spine and T spine, showing multi-level disc disease and T12 compression fracture.  Patient with DDD L3-4, 4-5.  - Patient declines possibility of nerve pill for pain today. ( Neurontin etc)  - Advised patient to make an appointment with orthopedics/surgery for further assessment PRN. - Patient knows to let us know if her pain worsens.  - Will continue to monitor.   Arthritis - Advised patient to remain moving to help alleviate her arthritis. - Encouraged a formal exercise routine as tolerated. - Will continue to monitor.    Education and routine counseling performed. Handouts provided.    Recommendations - Return for chronic care and re-check other labs as advised. - Return in mid-November for future blood work - Lipid Panel, Vitamin D re-check, A1c, and CMP. Then OV 3-4 d later with me - Otherwise, patient knows to call in with other health concerns PRN.   Return for Around mid to late November needs full set fasting blood work then [employer]	[place] via telehealth.   Anticipatory guidance and routine counseling done re: condition, txmnt options and need for follow up. All questions of patient's were answered.   Gross side effects, risk and benefits, and alternatives of medications discussed with patient.  Patient is aware that all medications have potential side effects and we are unable to predict every sideeffect or drug-drug interaction that may occur.  Expresses verbal understanding and consents to current therapy plan and treatment regiment.  Please see AVS handed out to patient at the end of our visit for additional patient instructions/  counseling done pertaining to today's office visit.  Note:  This document was prepared using Dragon voice recognition software and may include unintentional dictation errors.  This document serves as a record of services personally performed by [name], DO. It was created on her behalf by [name], a trained medical scribe. The creation of this record is based on the scribe's personal observations and the provider's statements to them.   I have reviewed the above medical documentation for accuracy and completeness and I concur.  [name], DO 08/06/2019 7:18 PM     ----------------------------------------------------------------------------------------------------------------------  Subjective:   CC:   Kim Lane is a 85 y.o. female who presents to [name] at [name] today for review and discussion of recent bloodwork that was done in addition to f/up on chronic conditions we are managing for pt.  1. All recent blood work that we ordered was reviewed with patient today.  Patient was counseled on all abnormalities and we discussed dietary and lifestyle changes that could help those values (also medications when appropriate).  Extensive health counseling performed and all patient's concerns/ questions were addressed.  See labs below and also plan for more details of these abnormalities  Lifestyle Says she's been trying to do better with her diet to control her blood sugars, "but I admit I've been eating because I've been by myself."  Confirms she has cut back on her Vitamin D.  Back Pain & LE Neurological Sx States she has been experiencing pain in her back and went to see orthopedics about it.  Says her main concern is that her feet at night "turn up and tingle" and she "has to get up out of bed and smash 'em down."  This sensation in the feet has been going on for "the last three months or so."  Saw Dr. Johnsie Cancel of cardiology on 02/23/2019.   Has had restless legs and cramping in her legs since at least March of 2020, as reported by cardiology.  Pt repeatedly confirms these symptoms improve after she walks around.  Says "I never have been a person that sits, but now I do; more than I've ever done."  1. 85 y.o. female here for cholesterol follow-up.   - Patient reports good compliance with medications or treatment plan. She is taking the cholesterol medication every night, "all the time."  - Denies medication S-E   - Smoking Status noted   - She denies new onset of: chest pain, exercise intolerance, shortness of breath, dizziness, visual changes, headache, lower extremity swelling or claudication.   Denies unusual myalgias  The cholesterol last visit was:  Lab Results  Component Value Date   CHOL 266 (H) 06/15/2019   HDL 73 06/15/2019   LDLCALC 176 (H) 06/15/2019   TRIG 87 06/15/2019   CHOLHDL 3.6 06/15/2019    Hepatic Function Latest Ref Rng & Units 07/31/2019 07/05/2019 06/15/2019  Total Protein 6.0 - 8.5 g/dL 6.6 6.8 6.9  Albumin 3.6 - 4.6 g/dL 4.4 3.9 4.6  AST 0 - 40 IU/L _0 ALT 0 - 32 IU/L _1 Alk Phosphatase 39 - 117 IU/L 51  43 51  Total Bilirubin 0.0 - 1.2 mg/dL 0.5 0.6 0.6  Bilirubin, Direct 0.0 - 0.3 mg/dL - - -      Wt Readings from Last 3 Encounters:  08/03/19 132 lb 14.4 oz (60.3 kg)  06/20/19 133 lb (60.3 kg)  05/31/19 137 lb 1.6 oz (62.2 kg)   BP Readings from Last 3 Encounters:  08/03/19 123/77  07/05/19 128/72  06/20/19 140/74   Pulse Readings from Last 3 Encounters:  08/03/19 68  07/05/19 68  06/20/19 63   BMI Readings from Last 3 Encounters:  08/03/19 25.11 kg/m  06/20/19 25.13 kg/m  05/31/19 25.90 kg/m     Patient Care Team    Relationship Specialty Notifications Start End  [name], DO PCP - General Family Medicine  07/02/17   Josue Hector, MD PCP - Cardiology Cardiology Admissions 09/16/18   Josue Hector, MD Consulting Physician Cardiology   07/02/17   Penni Bombard, MD Consulting Physician Neurology  07/02/17   Jarome Matin, MD Consulting Physician Dermatology  07/02/17   Rozetta Nunnery, MD Consulting Physician Otolaryngology  07/02/17   Brunetta Genera, MD Consulting Physician Hematology  07/02/17   Melina Schools, MD Consulting Physician Orthopedic Surgery  07/02/17     Full medical history updated and reviewed in the office today  Patient Active Problem List   Diagnosis Date Noted  . Glucose intolerance (impaired glucose tolerance) 11/24/2017    Priority: High  . Hodgkin lymphoma ([place]) 04/26/2017    Priority: High  . Essential hypertension, benign 01/08/2009    Priority: High  . Coronary atherosclerosis- sees Dr. Johnsie Cancel 01/08/2009    Priority: High  . Hypothyroidism 01/08/2009    Priority: High  . Closed compression fracture of L2 lumbar vertebra 06/24/2017    Priority: Medium  . Symptomatic anemia 04/26/2017    Priority: Medium  . HYPERCHOLESTEROLEMIA, MIXED 01/08/2009    Priority: Medium  . Osteoporosis 07/23/2017    Priority: Low  . CAD S/P percutaneous coronary angioplasty 07/02/2017    Priority: Low  . Degenerative disc disease, lumbar- MRI 6/ 2018 08/03/2019  . Neuropathy involving both lower extremities- due to DDD of L-Spine 08/03/2019  . Elevated serum creatinine 08/03/2019  . Prediabetes 06/20/2019  . Hyperlipidemia 06/20/2019  . History of CVA (cerebrovascular accident)- 2017-   05/31/2019  . Syncope and collapse 05/31/2019  . Blistering rash-new onset right palmar surface hand- at base esp btwn fingers 05/31/2019  . Maceration of skin 05/31/2019  . Degeneration of lumbar intervertebral disc 04/11/2019  . Lumbar radiculopathy 04/11/2019  . Pain of both hip joints 03/17/2019  . Localized, primary osteoarthritis 12/07/2018  . Cramps, muscle, general-mostly bilateral lower extremities, worse at night but not just nocturnal 12/07/2018  . Pain in finger of right hand 11/30/2018  . Sleep  difficulties 09/11/2018  . Muscle cramps at night 08/10/2018  . Arthritis 08/10/2018  . Dehydration, mild 08/10/2018  . S/P kyphoplasty- dr Rolena Infante- ortho 04/05/2018  . Postmenopausal 04/05/2018  . Closed fracture of multiple ribs 04/05/2018  . Allergic contact dermatitis due to cosmetics 01/26/2018  . Trigger finger, acquired- R 3rd digit 01/26/2018  . Osteoarthritis of finger 01/26/2018  . Vitamin D deficiency 11/24/2017  . Osteoarthritis 09/16/2017  . History of iron deficiency anemia-  by ONC 07/23/2017  . Abnormal nuclear cardiac imaging test 06/16/2017  . Malnutrition of moderate degree 04/27/2017  . Port catheter in place 02/02/2017  . Nodular sclerosis Hodgkin lymphoma of lymph nodes of neck ([place]) 01/13/2017  .  Dyspnea 09/14/2012  . Epistaxis 06/02/2011    Past Medical History:  Diagnosis Date  . Anemia   . Anginal pain ([place])    occ  . Blood dyscrasia 12/2016   hodgins lymphoma tx-remission  . CAD (coronary artery disease) 02/2006   Taxus stent placed to LAD and Diagonal per Dr. Olevia Perches  . Cataracts, bilateral   . Complication of anesthesia   . HTN (hypertension)   . Hypercholesterolemia   . Hypothyroidism   . IBS (irritable bowel syndrome)   . Osteoarthritis   . PONV (postoperative nausea and vomiting)   . Stroke [facility]) 03/2016    Past Surgical History:  Procedure Laterality Date  . APPENDECTOMY    . IR GENERIC HISTORICAL  01/25/2017   IR US GUIDE VASC ACCESS RIGHT 01/25/2017 [name], MD [facility]-INTERV RAD  . IR GENERIC HISTORICAL  01/25/2017   IR FLUORO GUIDE PORT INSERTION RIGHT 01/25/2017 [name], MD [facility]-INTERV RAD  . IR REMOVAL TUN ACCESS W/ PORT W/O FL MOD SED  10/28/2017  . KYPHOPLASTY N/A 06/24/2017   Procedure: KYPHOPLASTY T10;  Surgeon: Melina Schools, MD;  Location: [place];  Service: Orthopedics;  Laterality: N/A;  90 mins  . LEFT HEART CATH AND CORONARY ANGIOGRAPHY N/A 06/16/2017   Procedure: Left Heart Cath and Coronary Angiography;  Surgeon: Sherren Mocha, MD;  Location: [place] CV LAB;  Service: Cardiovascular;  Laterality: N/A;  . MASS EXCISION Right 12/22/2016   Procedure: RIGHT NECK LYMPH NODE BIOPSY;  Surgeon: Rozetta Nunnery, MD;  Location: [place];  Service: ENT;  Laterality: Right;  . SPINE SURGERY    . stents     in heart  . TONSILLECTOMY    . TOTAL KNEE ARTHROPLASTY Bilateral 2011,2012  . VAGINAL HYSTERECTOMY      Social History   Tobacco Use  . Smoking status: Former Research scientist (life sciences)  . Smokeless tobacco: Never Used  . Tobacco comment: quit smoking 30 years ago, very light smoker  Substance Use Topics  . Alcohol use: No    Alcohol/week: 0.0 standard drinks    Family Hx: Family History  Problem Relation Age of Onset  . Diabetes Mother   . Dementia Mother   . ALS Mother   . Heart Problems Father   . Liver disease Sister   . Cirrhosis Sister   . Heart block Unknown        CABG  . Diabetes Sister   . Heart block Son        CABG  . Other [place]  . Other Daughter        Vivian  . Dementia Sister   . Breast cancer Neg Hx      Medications: Current Outpatient Medications  Medication Sig Dispense Refill  . aspirin 81 MG tablet Take 81 mg by mouth every other day.     . carvedilol (COREG) 3.125 MG tablet TAKE 1 TABLET BY MOUTH EVERY OTHER MORNING 45 tablet 1  . Coenzyme Q10 100 MG capsule Take 100 mg by mouth daily.     . diclofenac sodium (VOLTAREN) 1 % GEL Apply 2 g topically 4 (four) times daily. To affected joint as needed only (substitution cream is okay) 100 g 1  . levothyroxine (SYNTHROID) 88 MCG tablet TAKE 1 TABLET BY MOUTH DAILY BEFORE BREAKFAST 90 tablet 0  . Melatonin 2.5 MG CAPS Take 1 capsule by mouth at bedtime.    . meloxicam (MOBIC) 7.5  MG tablet Take 1 tablet (7.5 mg total) by mouth daily. As needed joint/ arthritis pain 30 tablet 1  . Multiple Vitamins-Minerals (CENTRUM SILVER PO) Take 1 tablet by mouth daily.      . nitroGLYCERIN (NITROLINGUAL)  0.4 MG/SPRAY spray Place 1 spray under the tongue every 5 (five) minutes x 3 doses as needed for chest pain (3 sprays max).    . Omega-3 Fatty Acids (FISH OIL) 1000 MG CAPS Take 1 capsule by mouth daily.     [name] Kitchen PROAIR HFA 108 (90 Base) MCG/ACT inhaler Inhale 2 puffs into the lungs every 4 (four) hours as needed for shortness of breath.     . RESTASIS 0.05 % ophthalmic emulsion Place 2 drops into both eyes 2 (two) times daily. For dry eyes    . rosuvastatin (CRESTOR) 5 MG tablet Take 1 tablet (5 mg total) by mouth at bedtime. 90 tablet 1  . triamcinolone cream (KENALOG) 0.1 % Apply 1 application topically 2 (two) times daily. 60 g 0  . Vitamin D, Ergocalciferol, (DRISDOL) 1.25 MG (50000 UT) CAPS capsule Take 1 capsule (50,000 Units total) by mouth every 7 (seven) days. 12 capsule 3   No current facility-administered medications for this visit.     Allergies:  Allergies  Allergen Reactions  . Fosamax [Alendronate Sodium] Other (See Comments)    "made me ache all over"     Review of Systems: General:   No F/C, wt loss Pulm:   No DIB, SOB, pleuritic chest pain Card:  No CP, palpitations Abd:  No n/v/d or pain Ext:  No inc edema from baseline  Objective:  Blood pressure 123/77, pulse 68, temperature 98.5 F (36.9 C), height _0  (1.549 m), weight 132 lb 14.4 oz (60.3 kg), SpO2 97 %. Body mass index is 25.11 kg/m. Gen:   Well NAD, A and O *3 HEENT:    [place]/AT, EOMI,  MMM Lungs:   Normal work of breathing. CTA B/L, no Wh, rhonchi Heart:   RRR, S1, S2 WNL's, no MRG Abd:   No gross distention Exts:    warm, pink,  Brisk capillary refill, warm and well perfused.  Psych:    No HI/SI, judgement and insight good, Euthymic mood. Full Affect.   Recent Results (from the past 2160 hour(s))  Anaerobic and Aerobic Culture     Status: None   Collection Time: 05/31/19  4:45 PM   Specimen: Sterile Swab   STERILE SWAB  Result Value Ref Range   Anaerobic Culture Final report    Result 1 Comment      Comment: No anaerobic growth in 72 hours.   Aerobic Culture Final report    Result 1 Comment     Comment: No growth in 36 - 48 hours.  Virus culture     Status: None   Collection Time: 05/31/19  4:45 PM   Specimen: Sterile Swab   STERILE SWAB  Result Value Ref Range   Viral Culture: CANCELED     Comment: Test Not Performed.  This specimen was received in an expired collection/transport device.  It is impossible to predict or validate the performance characteristics of the transport device past the stated expiration date.              utm expired 1/20  Result canceled by the ancillary.   TSH     Status: None   Collection Time: 06/15/19  9:06 AM  Result Value Ref Range   TSH 2.550 0.450 - 4.500 uIU/mL  VITAMIN D 25 Hydroxy (Vit-D Deficiency, Fractures)     Status: Abnormal   Collection Time: 06/15/19  9:06 AM  Result Value Ref Range   Vit D, 25-Hydroxy 104.0 (H) 30.0 - 100.0 ng/mL    Comment: Vitamin D deficiency has been defined by the [place] practice guideline as a level of serum 25-OH vitamin D less than 20 ng/mL (1,2). The Endocrine Society went on to further define vitamin D insufficiency as a level between 21 and 29 ng/mL (2). 1. IOM (Institute of Medicine). 2010. Dietary reference    intakes for calcium and D. [place]: The    [employer]. 2. Holick MF, Binkley , Bischoff-Ferrari HA, et al.    Evaluation, treatment, and prevention of vitamin D    deficiency: an Endocrine Society clinical practice    guideline. JCEM. 2011 Jul; 96(7):1911-30.   Lipid panel     Status: Abnormal   Collection Time: 06/15/19  9:06 AM  Result Value Ref Range   Cholesterol, Total 266 (H) 100 - 199 mg/dL   Triglycerides 87 0 - 149 mg/dL   HDL 73 >39 mg/dL   VLDL Cholesterol Cal 17 5 - 40 mg/dL   LDL Calculated 176 (H) 0 - 99 mg/dL   Chol/HDL Ratio 3.6 0.0 - 4.4 ratio    Comment:                                   T. Chol/HDL  Ratio                                             Men  Women                               1/2 Avg.Risk  3.4    3.3                                   Avg.Risk  5.0    4.4                                2X Avg.Risk  9.6    7.1                                3X Avg.Risk 23.4   11.0   T4, free     Status: None   Collection Time: 06/15/19  9:06 AM  Result Value Ref Range   Free T4 1.07 0.82 - 1.77 ng/dL  CBC with Differential/Platelet     Status: Abnormal   Collection Time: 06/15/19  9:06 AM  Result Value Ref Range   WBC 6.6 3.4 - 10.8 x10E3/uL   RBC 4.63 3.77 - 5.28 x10E6/uL   Hemoglobin 13.7 11.1 - 15.9 g/dL   Hematocrit 41.5 34.0 - 46.6 %   MCV 90 79 - 97 fL   MCH 29.6 26.6 - 33.0 pg   MCHC 33.0 31.5 - 35.7 g/dL   RDW 13.6 11.7 - 15.4 %   Platelets 415 150 - 450 x10E3/uL  Neutrophils 57 Not Estab. %   Lymphs 21 Not Estab. %   Monocytes 10 Not Estab. %   Eos 9 Not Estab. %   Basos 2 Not Estab. %   Neutrophils Absolute 3.8 1.4 - 7.0 x10E3/uL   Lymphocytes Absolute 1.4 0.7 - 3.1 x10E3/uL   Monocytes Absolute 0.6 0.1 - 0.9 x10E3/uL   EOS (ABSOLUTE) 0.6 (H) 0.0 - 0.4 x10E3/uL   Basophils Absolute 0.1 0.0 - 0.2 x10E3/uL   Immature Granulocytes 1 Not Estab. %   Immature Grans (Abs) 0.1 0.0 - 0.1 x10E3/uL  Comprehensive metabolic panel     Status: Abnormal   Collection Time: 06/15/19  9:06 AM  Result Value Ref Range   Glucose 111 (H) 65 - 99 mg/dL   BUN 12 8 - 27 mg/dL   Creatinine, Ser 1.19 (H) 0.57 - 1.00 mg/dL   GFR calc non Af Amer 42 (L) >59 mL/min/1.73   GFR calc Af Amer 49 (L) >59 mL/min/1.73   BUN/Creatinine Ratio 10 (L) 12 - 28   Sodium 141 134 - 144 mmol/L   Potassium 4.5 3.5 - 5.2 mmol/L   Chloride 102 96 - 106 mmol/L   CO2 25 20 - 29 mmol/L   Calcium 9.3 8.7 - 10.3 mg/dL   Total Protein 6.9 6.0 - 8.5 g/dL   Albumin 4.6 3.6 - 4.6 g/dL   Globulin, Total 2.3 1.5 - 4.5 g/dL   Albumin/Globulin Ratio 2.0 1.2 - 2.2   Bilirubin Total 0.6 0.0 - 1.2 mg/dL   Alkaline  Phosphatase 51 39 - 117 IU/L   AST 25 0 - 40 IU/L   ALT 18 0 - 32 IU/L  Hemoglobin A1c     Status: Abnormal   Collection Time: 06/15/19  9:06 AM  Result Value Ref Range   Hgb A1c MFr Bld 6.1 (H) 4.8 - 5.6 %    Comment:          Prediabetes: 5.7 - 6.4          Diabetes: >6.4          Glycemic control for adults with diabetes: <7.0    Est. average glucose Bld gHb Est-mCnc 128 mg/dL  Sedimentation rate     Status: None   Collection Time: 07/05/19  9:45 AM  Result Value Ref Range   Sed Rate 10 0 - 22 mm/hr    Comment: Performed at [facility], [place] [address], [place], [place] [address]  CMP ([place] only)     Status: Abnormal   Collection Time: 07/05/19  9:45 AM  Result Value Ref Range   Sodium 138 135 - 145 mmol/L   Potassium 4.4 3.5 - 5.1 mmol/L   Chloride 103 98 - 111 mmol/L   CO2 25 22 - 32 mmol/L   Glucose, Bld 110 (H) 70 - 99 mg/dL   BUN 19 8 - 23 mg/dL   Creatinine 0.97 0.44 - 1.00 mg/dL   Calcium 8.7 (L) 8.9 - 10.3 mg/dL   Total Protein 6.8 6.5 - 8.1 g/dL   Albumin 3.9 3.5 - 5.0 g/dL   AST 22 15 - 41 U/L   ALT 17 0 - 44 U/L   Alkaline Phosphatase 43 38 - 126 U/L   Total Bilirubin 0.6 0.3 - 1.2 mg/dL   GFR, Est Non Af Am 54 (L) >60 mL/min   GFR, Est AFR Am >60 >60 mL/min   Anion gap 10 5 - 15    Comment: Performed at [facility]  [facility], [place] [address], [place], [place] [address]  CBC with Differential ([place] Only)     Status: Abnormal   Collection Time: 07/05/19  9:45 AM  Result Value Ref Range   WBC Count 6.7 4.0 - 10.5 K/uL   RBC 4.16 3.87 - 5.11 MIL/uL   Hemoglobin 12.3 12.0 - 15.0 g/dL   HCT 38.3 36.0 - 46.0 %   MCV 92.1 80.0 - 100.0 fL   MCH 29.6 26.0 - 34.0 pg   MCHC 32.1 30.0 - 36.0 g/dL   RDW 14.4 11.5 - 15.5 %   Platelet Count 350 150 - 400 K/uL   nRBC 0.0 0.0 - 0.2 %   Neutrophils Relative % 59 %   Neutro Abs 4.0 1.7 - 7.7 K/uL   Lymphocytes Relative 17 %   Lymphs Abs 1.1 0.7 - 4.0 K/uL    Monocytes Relative 10 %   Monocytes Absolute 0.6 0.1 - 1.0 K/uL   Eosinophils Relative 11 %   Eosinophils Absolute 0.7 (H) 0.0 - 0.5 K/uL   Basophils Relative 2 %   Basophils Absolute 0.1 0.0 - 0.1 K/uL   Immature Granulocytes 1 %   Abs Immature Granulocytes 0.04 0.00 - 0.07 K/uL    Comment: Performed at [facility], [place] [address], [place], [place] [address]  Comprehensive metabolic panel     Status: Abnormal   Collection Time: 07/31/19  8:37 AM  Result Value Ref Range   Glucose 111 (H) 65 - 99 mg/dL   BUN 15 8 - 27 mg/dL   Creatinine, Ser 1.07 (H) 0.57 - 1.00 mg/dL   GFR calc non Af Amer 48 (L) >59 mL/min/1.73   GFR calc Af Amer 55 (L) >59 mL/min/1.73   BUN/Creatinine Ratio 14 12 - 28   Sodium 144 134 - 144 mmol/L   Potassium 4.4 3.5 - 5.2 mmol/L   Chloride 104 96 - 106 mmol/L   CO2 25 20 - 29 mmol/L   Calcium 9.2 8.7 - 10.3 mg/dL   Total Protein 6.6 6.0 - 8.5 g/dL   Albumin 4.4 3.6 - 4.6 g/dL   Globulin, Total 2.2 1.5 - 4.5 g/dL   Albumin/Globulin Ratio 2.0 1.2 - 2.2   Bilirubin Total 0.5 0.0 - 1.2 mg/dL   Alkaline Phosphatase 51 39 - 117 IU/L   AST 25 0 - 40 IU/L   ALT 17 0 - 32 IU/L

## 2019-08-29 ENCOUNTER — Encounter: Payer: Self-pay | Admitting: Diagnostic Neuroimaging

## 2019-08-29 ENCOUNTER — Ambulatory Visit (INDEPENDENT_AMBULATORY_CARE_PROVIDER_SITE_OTHER): Payer: Medicare Other | Admitting: Diagnostic Neuroimaging

## 2019-08-29 ENCOUNTER — Other Ambulatory Visit: Payer: Self-pay

## 2019-08-29 VITALS — BP 134/80 | HR 77 | Temp 98.6°F | Ht 61.0 in | Wt 135.0 lb

## 2019-08-29 DIAGNOSIS — G459 Transient cerebral ischemic attack, unspecified: Secondary | ICD-10-CM

## 2019-08-29 DIAGNOSIS — R55 Syncope and collapse: Secondary | ICD-10-CM | POA: Diagnosis not present
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
## 2019-09-12 ENCOUNTER — Other Ambulatory Visit: Payer: Self-pay | Admitting: Family Medicine

## 2019-09-14 ENCOUNTER — Other Ambulatory Visit: Payer: Self-pay | Admitting: Family Medicine

## 2019-09-14 DIAGNOSIS — Z1231 Encounter for screening mammogram for malignant neoplasm of breast: Secondary | ICD-10-CM

## 2019-11-01 ENCOUNTER — Other Ambulatory Visit: Payer: Self-pay

## 2019-11-01 ENCOUNTER — Ambulatory Visit
Admission: RE | Admit: 2019-11-01 | Discharge: 2019-11-01 | Disposition: A | Discharge status: Home / Self Care | Payer: Medicare Other | Source: Ambulatory Visit | Attending: Family Medicine | Admitting: Family Medicine

## 2019-11-01 DIAGNOSIS — Z1231 Encounter for screening mammogram for malignant neoplasm of breast: Secondary | ICD-10-CM

## 2019-11-02 ENCOUNTER — Other Ambulatory Visit: Payer: Self-pay | Admitting: Family Medicine

## 2019-11-02 DIAGNOSIS — I10 Essential (primary) hypertension: Secondary | ICD-10-CM

## 2019-11-06 ENCOUNTER — Inpatient Hospital Stay: Payer: Medicare Other | Attending: Hematology

## 2019-11-06 ENCOUNTER — Other Ambulatory Visit: Payer: Self-pay

## 2019-11-06 ENCOUNTER — Inpatient Hospital Stay: Payer: Medicare Other | Admitting: Hematology

## 2019-11-06 ENCOUNTER — Ambulatory Visit: Payer: Medicare Other

## 2019-11-06 ENCOUNTER — Telehealth: Payer: Self-pay | Admitting: Hematology

## 2019-11-06 VITALS — BP 138/77 | HR 63 | Temp 97.8°F | Resp 18 | Ht 61.0 in | Wt 130.5 lb

## 2019-11-06 DIAGNOSIS — Z7982 Long term (current) use of aspirin: Secondary | ICD-10-CM | POA: Insufficient documentation

## 2019-11-06 DIAGNOSIS — M810 Age-related osteoporosis without current pathological fracture: Secondary | ICD-10-CM | POA: Insufficient documentation

## 2019-11-06 DIAGNOSIS — I10 Essential (primary) hypertension: Secondary | ICD-10-CM | POA: Diagnosis not present

## 2019-11-06 DIAGNOSIS — E039 Hypothyroidism, unspecified: Secondary | ICD-10-CM | POA: Insufficient documentation

## 2019-11-06 DIAGNOSIS — Z79899 Other long term (current) drug therapy: Secondary | ICD-10-CM | POA: Insufficient documentation

## 2019-11-06 DIAGNOSIS — C8190 Hodgkin lymphoma, unspecified, unspecified site: Secondary | ICD-10-CM | POA: Diagnosis not present

## 2019-11-06 DIAGNOSIS — D509 Iron deficiency anemia, unspecified: Secondary | ICD-10-CM | POA: Diagnosis not present

## 2019-11-06 DIAGNOSIS — C8111 Nodular sclerosis classical Hodgkin lymphoma, lymph nodes of head, face, and neck: Secondary | ICD-10-CM | POA: Insufficient documentation

## 2019-11-06 DIAGNOSIS — R21 Rash and other nonspecific skin eruption: Secondary | ICD-10-CM | POA: Diagnosis not present

## 2019-11-06 DIAGNOSIS — M549 Dorsalgia, unspecified: Secondary | ICD-10-CM | POA: Insufficient documentation

## 2019-11-06 DIAGNOSIS — Z87891 Personal history of nicotine dependence: Secondary | ICD-10-CM | POA: Diagnosis not present

## 2019-11-06 DIAGNOSIS — G4700 Insomnia, unspecified: Secondary | ICD-10-CM | POA: Insufficient documentation

## 2019-11-06 DIAGNOSIS — Z791 Long term (current) use of non-steroidal anti-inflammatories (NSAID): Secondary | ICD-10-CM | POA: Diagnosis not present

## 2019-11-06 DIAGNOSIS — M8000XG Age-related osteoporosis with current pathological fracture, unspecified site, subsequent encounter for fracture with delayed healing: Secondary | ICD-10-CM

## 2019-11-06 DIAGNOSIS — E785 Hyperlipidemia, unspecified: Secondary | ICD-10-CM | POA: Insufficient documentation

## 2019-11-06 LAB — CBC WITH DIFFERENTIAL/PLATELET
Abs Immature Granulocytes: 0.03 10*3/uL (ref 0.00–0.07)
Basophils Absolute: 0.1 10*3/uL (ref 0.0–0.1)
Basophils Relative: 1 %
Eosinophils Absolute: 0.6 10*3/uL — ABNORMAL HIGH (ref 0.0–0.5)
Eosinophils Relative: 8 %
HCT: 41.9 % (ref 36.0–46.0)
Hemoglobin: 13.6 g/dL (ref 12.0–15.0)
Immature Granulocytes: 0 %
Lymphocytes Relative: 19 %
Lymphs Abs: 1.4 10*3/uL (ref 0.7–4.0)
MCH: 29.9 pg (ref 26.0–34.0)
MCHC: 32.5 g/dL (ref 30.0–36.0)
MCV: 92.1 fL (ref 80.0–100.0)
Monocytes Absolute: 0.7 10*3/uL (ref 0.1–1.0)
Monocytes Relative: 10 %
Neutro Abs: 4.4 10*3/uL (ref 1.7–7.7)
Neutrophils Relative %: 62 %
Platelets: 350 10*3/uL (ref 150–400)
RBC: 4.55 MIL/uL (ref 3.87–5.11)
RDW: 14.1 % (ref 11.5–15.5)
WBC: 7.2 10*3/uL (ref 4.0–10.5)
nRBC: 0 % (ref 0.0–0.2)

## 2019-11-06 LAB — CMP (CANCER CENTER ONLY)
ALT: 24 U/L (ref 0–44)
AST: 28 U/L (ref 15–41)
Albumin: 4.3 g/dL (ref 3.5–5.0)
Alkaline Phosphatase: 48 U/L (ref 38–126)
Anion gap: 11 (ref 5–15)
BUN: 15 mg/dL (ref 8–23)
CO2: 28 mmol/L (ref 22–32)
Calcium: 9.3 mg/dL (ref 8.9–10.3)
Chloride: 102 mmol/L (ref 98–111)
Creatinine: 1.03 mg/dL — ABNORMAL HIGH (ref 0.44–1.00)
GFR, Est AFR Am: 58 mL/min — ABNORMAL LOW (ref >60)
GFR, Estimated: 50 mL/min — ABNORMAL LOW (ref >60)
Glucose, Bld: 103 mg/dL — ABNORMAL HIGH (ref 70–99)
Potassium: 4.2 mmol/L (ref 3.5–5.1)
Sodium: 141 mmol/L (ref 135–145)
Total Bilirubin: 0.5 mg/dL (ref 0.3–1.2)
Total Protein: 7.5 g/dL (ref 6.5–8.1)

## 2019-11-06 LAB — SEDIMENTATION RATE: Sed Rate: 7 mm/hr (ref 0–22)

## 2019-11-06 NOTE — Telephone Encounter (Signed)
Scheduled appt per 11/16 los. ° °Printed calendar and avs. °

## 2019-11-06 NOTE — Progress Notes (Signed)
HEMATOLOGY/ONCOLOGY CLINIC NOTE  Date of Service: 11/06/19  Patient Care Team: [name], DO as PCP - General (Family Medicine) Josue Hector, MD as PCP - Cardiology (Cardiology) Josue Hector, MD as Consulting Physician (Cardiology) Penni Bombard, MD as Consulting Physician (Neurology) Jarome Matin, MD as Consulting Physician (Dermatology) Rozetta Nunnery, MD as Consulting Physician (Otolaryngology) Brunetta Genera, MD as Consulting Physician (Hematology) Melina Schools, MD as Consulting Physician (Orthopedic Surgery)  CHIEF COMPLAINTS/PURPOSE OF CONSULTATION:   f/u for continued treatment of Hodgkin's lymphoma  HISTORY OF PRESENTING ILLNESS:   plz see previous   INTERVAL HISTORY   Kim Lane is here for scheduled follow up for her Hodgkins lymphoma. The patient's last visit with [place] was on 05/05/2019. The pt reports that she is doing well overall.  The pt reports that she has arthritis which has been causing her a significant amount of back pain. She has had several rashes in the interim but she attributes this to the ointment she uses to help with her arthritis pain. Pt also reports sleeplessness and does sometime nap during the day. She uses OTC medication to help her sleep. Some nights she is able to sleep just fine and other nights she gets almost no sleep. Pt is taking high-dose Vitamin D once a week. She reports a significant increase in spider veins.   Lab results today (11/06/19) of CBC w/diff and CMP is as follows: all values are WNL except for Eos Abs at 0.6K, Glucose at 103, Creatinine at 1.03, GFR Est Non Afr Am at 50. 11/06/2019 Sed Rate at 7 11/06/2019 Vitamin D 25-hydroxy is in progress   On review of systems, pt reports rashes, back pain, sleeplessness and denies fever, chills, abdominal pain, leg swelling and any other symptoms.    MEDICAL HISTORY:  Past Medical History:  Diagnosis Date  . Anemia   . Anginal pain ([place])     occ  . Arthritis   . Blood dyscrasia 12/2016   hodgins lymphoma tx-remission  . CAD (coronary artery disease) 02/2006   Taxus stent placed to LAD and Diagonal per Dr. Olevia Perches  . Cataracts, bilateral   . Complication of anesthesia   . HTN (hypertension)   . Hypercholesterolemia   . Hypothyroidism   . IBS (irritable bowel syndrome)   . Osteoarthritis   . PONV (postoperative nausea and vomiting)   . Stroke [facility]) 03/2016  Osteoporosis Cardiologist Dr. [name] -Dr. Earlean Shawl  SURGICAL HISTORY: Past Surgical History:  Procedure Laterality Date  . APPENDECTOMY    . IR GENERIC HISTORICAL  01/25/2017   IR US GUIDE VASC ACCESS RIGHT 01/25/2017 [name], MD [facility]  . IR GENERIC HISTORICAL  01/25/2017   IR FLUORO GUIDE PORT INSERTION RIGHT 01/25/2017 [name], MD [facility]  . IR REMOVAL TUN ACCESS W/ PORT W/O FL MOD SED  10/28/2017  . KYPHOPLASTY N/A 06/24/2017   Procedure: KYPHOPLASTY T10;  Surgeon: Melina Schools, MD;  Location: [facility];  Service: Orthopedics;  Laterality: N/A;  90 mins  . LEFT HEART CATH AND CORONARY ANGIOGRAPHY N/A 06/16/2017   Procedure: Left Heart Cath and Coronary Angiography;  Surgeon: Sherren Mocha, MD;  Location: [facility];  Service: Cardiovascular;  Laterality: N/A;  . MASS EXCISION Right 12/22/2016   Procedure: RIGHT NECK LYMPH NODE BIOPSY;  Surgeon: Rozetta Nunnery, MD;  Location: [facility];  Service: ENT;  Laterality: Right;  . SPINE SURGERY    .  stents     in heart  . TONSILLECTOMY    . TOTAL KNEE ARTHROPLASTY Bilateral 2011,2012  . VAGINAL HYSTERECTOMY      SOCIAL HISTORY: Social History   Socioeconomic History  . Marital status: Widowed    Spouse name: Not on file  . Number of children: 2  . Years of education: 34  . 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 Financial resource strain: Not on file   . Food insecurity    Worry: Not on file    Inability: Not on file  . Transportation needs    Medical: Not on file    Non-medical: Not on file  Tobacco Use  . Smoking status: Former Research scientist (life sciences)  . Smokeless tobacco: Never Used  . Tobacco comment: quit smoking 30 years ago, very light smoker  Substance and Sexual Activity  . Alcohol use: No    Alcohol/week: 0.0 standard drinks  . Drug use: No  . Sexual activity: Not on file  Lifestyle  . Physical activity    Days per week: Not on file    Minutes per session: Not on file  . Stress: Not on file  Relationships  . Social Herbalist on phone: Not on file    Gets together: Not on file    Attends religious service: Not on file    Active member of club or organization: Not on file    Attends meetings of clubs or organizations: Not on file    Relationship status: Not on file  . Intimate partner violence    Fear of current or ex partner: Not on file    Emotionally abused: Not on file    Physically abused: Not on file    Forced sexual activity: Not on file  Other Topics Concern  . Not on file  Social History Narrative   08/29/19 Lives alone   caffeine use- coffee- 5 cups daily    FAMILY HISTORY: Family History  Problem Relation Age of Onset  . Diabetes Mother   . Dementia Mother   . ALS Mother   . Heart Problems Father   . Liver disease Sister   . Cirrhosis Sister   . Heart block Other        CABG  . Diabetes Sister   . Heart block Son        CABG  . Other [place]  . Other Daughter        [place]  . Dementia Sister   . Breast cancer Neg Hx     ALLERGIES:  is allergic to fosamax [alendronate sodium].  MEDICATIONS:  Current Outpatient Medications  Medication Sig Dispense Refill  . aspirin 81 MG tablet Take 81 mg by mouth every other day.     . carvedilol (COREG) 3.125 MG tablet TAKE 1 TABLET BY MOUTH EVERY OTHER MORNING 45 tablet 1  . Coenzyme Q10 100 MG capsule Take 100 mg by mouth  daily.     . diclofenac sodium (VOLTAREN) 1 % GEL Apply 2 g topically 4 (four) times daily. To affected joint as needed only (substitution cream is okay) 100 g 1  . levothyroxine (SYNTHROID) 88 MCG tablet TAKE 1 TABLET BY MOUTH DAILY BEFORE BREAKFAST 90 tablet 0  . Melatonin 2.5 MG CAPS Take 1 capsule by mouth at bedtime.    [name] Kitchen  meloxicam (MOBIC) 7.5 MG tablet Take 1 tablet (7.5 mg total) by mouth daily. As needed joint/ arthritis pain 30 tablet 1  . Multiple Vitamins-Minerals (CENTRUM SILVER PO) Take 1 tablet by mouth daily.      . nitroGLYCERIN (NITROLINGUAL) 0.4 MG/SPRAY spray Place 1 spray under the tongue every 5 (five) minutes x 3 doses as needed for chest pain (3 sprays max).    . Omega-3 Fatty Acids (FISH OIL) 1000 MG CAPS Take 1 capsule by mouth daily.     [name] Kitchen PROAIR HFA 108 (90 Base) MCG/ACT inhaler Inhale 2 puffs into the lungs every 4 (four) hours as needed for shortness of breath.     . RESTASIS 0.05 % ophthalmic emulsion Place 2 drops into both eyes 2 (two) times daily. For dry eyes    . rosuvastatin (CRESTOR) 5 MG tablet Take 1 tablet (5 mg total) by mouth at bedtime. 90 tablet 1  . triamcinolone cream (KENALOG) 0.1 % Apply 1 application topically 2 (two) times daily. 60 g 0  . Vitamin D, Ergocalciferol, (DRISDOL) 1.25 MG (50000 UT) CAPS capsule Take 1 capsule (50,000 Units total) by mouth every 7 (seven) days. 12 capsule 3   No current facility-administered medications for this visit.     REVIEW OF SYSTEMS:  A 10+ POINT REVIEW OF SYSTEMS WAS OBTAINED including neurology, dermatology, psychiatry, cardiac, respiratory, lymph, extremities, GI, GU, Musculoskeletal, constitutional, breasts, reproductive, HEENT.  All pertinent positives are noted in the HPI.  All others are negative.   PHYSICAL EXAMINATION:  ECOG PERFORMANCE STATUS: 2 - Symptomatic, <50% confined to bed  Vitals:   11/06/19 0939  BP: 138/77  Pulse: 63  Resp: 18  Temp: 97.8 F (36.6 C)  TempSrc: Temporal  SpO2:  100%  Weight: 130 lb 8 oz (59.2 kg)  Height: 5\' 1"  (1.549 m)  .  Exam was given in a chair   GENERAL:alert, in no acute distress and comfortable SKIN: no acute rashes, no significant lesions EYES: conjunctiva are pink and non-injected, sclera anicteric OROPHARYNX: MMM, no exudates, no oropharyngeal erythema or ulceration NECK: supple, no JVD LYMPH:  no palpable lymphadenopathy in the cervical, axillary or inguinal regions LUNGS: clear to auscultation b/l with normal respiratory effort HEART: regular rate & rhythm ABDOMEN:  normoactive bowel sounds , non tender, not distended. No palpable hepatosplenomegaly.  Extremity: no pedal edema PSYCH: alert & oriented x 3 with fluent speech NEURO: no focal motor/sensory deficits  LABORATORY DATA:  I have reviewed the data as listed   . CBC Latest Ref Rng & Units 11/06/2019 07/05/2019 06/15/2019  WBC 4.0 - 10.5 K/uL 7.2 6.7 6.6  Hemoglobin 12.0 - 15.0 g/dL 13.6 12.3 13.7  Hematocrit 36.0 - 46.0 % 41.9 38.3 41.5  Platelets 150 - 400 K/uL 350 350 415   . CMP Latest Ref Rng & Units 11/06/2019 07/31/2019 07/05/2019  Glucose 70 - 99 mg/dL 103(H) 111(H) 110(H)  BUN 8 - 23 mg/dL 15 15 19   Creatinine 0.44 - 1.00 mg/dL 1.03(H) 1.07(H) 0.97  Sodium 135 - 145 mmol/L 141 144 138  Potassium 3.5 - 5.1 mmol/L 4.2 4.4 4.4  Chloride 98 - 111 mmol/L 102 104 103  CO2 22 - 32 mmol/L 28 25 25   Calcium 8.9 - 10.3 mg/dL 9.3 9.2 8.7(L)  Total Protein 6.5 - 8.1 g/dL 7.5 6.6 6.8  Total Bilirubin 0.3 - 1.2 mg/dL 0.5 0.5 0.6  Alkaline Phos 38 - 126 U/L 48 51 43  AST 15 - 41 U/L 28 25 22  ALT 0 - 44 U/L 24 17 17    Erythrocyte Sedimentation Rate     Component Value Date/Time   ESRSEDRATE 7 11/06/2019 0840   ESRSEDRATE 20 10/07/2017 1115    RADIOGRAPHIC STUDIES: I have personally reviewed the radiological images as listed and agreed with the findings in the report. Mm 3d Screen Breast Bilateral  Result Date: 11/02/2019 CLINICAL DATA:  Screening. EXAM:  DIGITAL SCREENING BILATERAL MAMMOGRAM WITH TOMO AND CAD COMPARISON:  Previous exam(s). ACR Breast Density Category b: There are scattered areas of fibroglandular density. FINDINGS: There are no findings suspicious for malignancy. Images were processed with CAD. IMPRESSION: No mammographic evidence of malignancy. A result letter of this screening mammogram will be mailed directly to the patient. RECOMMENDATION: Screening mammogram in one year. (Code:SM-B-01Y) BI-RADS CATEGORY  1: Negative. Electronically Signed   By: Abelardo Diesel M.D.   On: 11/02/2019 11:35     ASSESSMENT & PLAN:   85 y.o. Caucasian female with ECOG performance status of 2 with  1) Classical Hodgkin's lymphoma of nodular sclerosis variety At least Stage IIIB some concern for possible Rt lung involvement which makes it Stage IVB Presented with right neck lymph nodes . She had type B constitutional symptoms with 15-20 pound weight loss night sweats or chills .noted to have significant pruritus with mild rash likely from Hodgkin's which remain resolved  Wt Readings from Last 3 Encounters:  11/06/19 130 lb 8 oz (59.2 kg)  08/29/19 135 lb (61.2 kg)  08/03/19 132 lb 14.4 oz (60.3 kg)  PET/CT scan after 5 cycles shows marked response to chemotherapy and stable predominantly [place] 2 as per discussion with the radiology.  S/p 5 cycles of AVD with last Cycle of AVD on 05/19/17  2) Microcytic anemia - likely anemia of chronic disease due to Hodgkin's lymphoma. anemia resolved, Hg stable   PLAN:  -Discussed pt labwork today, 11/06/19; blood counts and chemistries look good -Discussed 11/06/2019 Sed Rate at 7 -Discussed 11/06/2019 Vitamin D 25-hydroxy is in progress  -Recommended pt use compression socks and leg elevation for her telangiectasia -The pt shows no clinical or lab progression of her Hodgkin's lymphoma at this time.  -No indication for further treatment at this time  -No residual toxicities  -Will continue to monitor  every 6 months -Recommend mindfulness techniques prior to bedtime, and recommend avoiding tea and coffee after 2pm in the afternoon for aide in sleeping -Continue high dose Vitamin D replacement -Continue Prolia injection every 6 months  -Will see back in 6 months with labs   3) T10 compression fracture based on imaging done on 05/22/2017 in ED Now status post kyphoplasty with significant improvement in her back pain  4) Osteoporosis PET/CT scan also shows possible L4 compression fracture . These compression fractures appear to be related to osteoporosis . Also had rib fracture related to no significant trauma She was previously on Fosamax that cause generalized body aches .  PLAN:  -I explained she should notify us if she is thinking of doing invasive dental procedures while on Prolia.  -Continue ergocalciferol 50k twice weekly to target a 25OH vit D level of close to 50-60 - levels adequate can reduce dose to once weekly -Continue with Prolia injection every 6 months  5) h/o triple-vessel coronary artery disease based on cardiac cath: patient is surprisingly asymptomatic. -Continue management as per PCP and cardiology.  6) . Patient Active Problem List   Diagnosis Date Noted  . Degenerative disc disease, lumbar- MRI 6/ 2018 08/03/2019  .  Neuropathy involving both lower extremities- due to DDD of L-Spine 08/03/2019  . Elevated serum creatinine 08/03/2019  . Prediabetes 06/20/2019  . Hyperlipidemia 06/20/2019  . History of CVA (cerebrovascular accident)- 2017-   05/31/2019  . Syncope and collapse 05/31/2019  . Blistering rash-new onset right palmar surface hand- at base esp btwn fingers 05/31/2019  . Maceration of skin 05/31/2019  . Degeneration of lumbar intervertebral disc 04/11/2019  . Lumbar radiculopathy 04/11/2019  . Pain of both hip joints 03/17/2019  . Localized, primary osteoarthritis 12/07/2018  . Cramps, muscle, general-mostly bilateral lower extremities, worse at night  but not just nocturnal 12/07/2018  . Pain in finger of right hand 11/30/2018  . Sleep difficulties 09/11/2018  . Muscle cramps at night 08/10/2018  . Arthritis 08/10/2018  . Dehydration, mild 08/10/2018  . S/P kyphoplasty- dr Rolena Infante- ortho 04/05/2018  . Postmenopausal 04/05/2018  . Closed fracture of multiple ribs 04/05/2018  . Allergic contact dermatitis due to cosmetics 01/26/2018  . Trigger finger, acquired- R 3rd digit 01/26/2018  . Osteoarthritis of finger 01/26/2018  . Vitamin D deficiency 11/24/2017  . Glucose intolerance (impaired glucose tolerance) 11/24/2017  . Osteoarthritis 09/16/2017  . History of iron deficiency anemia-  by ONC 07/23/2017  . Osteoporosis 07/23/2017  . CAD S/P percutaneous coronary angioplasty 07/02/2017  . Closed compression fracture of L2 lumbar vertebra 06/24/2017  . Abnormal nuclear cardiac imaging test 06/16/2017  . Malnutrition of moderate degree 04/27/2017  . Symptomatic anemia 04/26/2017  . Hodgkin lymphoma ([place]) 04/26/2017  . Port catheter in place 02/02/2017  . Nodular sclerosis Hodgkin lymphoma of lymph nodes of neck ([place]) 01/13/2017  . Dyspnea 09/14/2012  . Epistaxis 06/02/2011  . HYPERCHOLESTEROLEMIA, MIXED 01/08/2009  . Essential hypertension, benign 01/08/2009  . Coronary atherosclerosis- sees Dr. Johnsie Cancel 01/08/2009  . Hypothyroidism 01/08/2009  PLAN  -Continue follow-up with primary care physician for management of other chronic medical co-morbidities.   FOLLOW UP: COntinue Prolia q32months- plz schedule next 3 appointments RTC with Dr [name] with labs in 6 months    The total time spent in the appt was 15 minutes and more than 50% was on counseling and direct patient cares.  All of the patient's questions were answered with apparent satisfaction. The patient knows to call the clinic with any problems, questions or concerns.   Sullivan Lone MD [place] AAHIVMS [facility] Hematology/Oncology Physician [facility]  (Office):        [phone] (Work cell):  [phone] (Fax):           [phone]  I, Yevette Edwards, am acting as a scribe for Dr. Sullivan Lone.   .I have reviewed the above documentation for accuracy and completeness, and I agree with the above. Brunetta Genera MD

## 2019-11-07 LAB — VITAMIN D 25 HYDROXY (VIT D DEFICIENCY, FRACTURES): Vit D, 25-Hydroxy: 84.2 ng/mL (ref 30–100)

## 2019-11-10 ENCOUNTER — Ambulatory Visit
Admission: EM | Admit: 2019-11-10 | Discharge: 2019-11-10 | Disposition: A | Discharge status: Home / Self Care | Payer: Medicare Other | Attending: Physician Assistant | Admitting: Physician Assistant

## 2019-11-10 ENCOUNTER — Ambulatory Visit (INDEPENDENT_AMBULATORY_CARE_PROVIDER_SITE_OTHER): Payer: Medicare Other

## 2019-11-10 ENCOUNTER — Telehealth: Payer: Self-pay

## 2019-11-10 ENCOUNTER — Other Ambulatory Visit: Payer: Self-pay

## 2019-11-10 DIAGNOSIS — M25511 Pain in right shoulder: Secondary | ICD-10-CM

## 2019-11-10 DIAGNOSIS — W19XXXA Unspecified fall, initial encounter: Secondary | ICD-10-CM

## 2019-11-10 DIAGNOSIS — W010XXA Fall on same level from slipping, tripping and stumbling without subsequent striking against object, initial encounter: Secondary | ICD-10-CM

## 2019-11-10 MED ORDER — MELOXICAM 7.5 MG PO TABS: 7.5000 mg | ORAL_TABLET | Freq: Every day | ORAL | 0 refills | Status: DC

## 2019-11-10 NOTE — ED Triage Notes (Signed)
Pt states tripped and fell in her gravel driveway yesterday, landing on hands and knees. States put heat to that area and the pain became worse, then put ice to area with some relief.

## 2019-11-10 NOTE — ED Provider Notes (Signed)
[facility]    CSN: [id number] Arrival date & time: 11/10/19  0847      History   Chief Complaint Chief Complaint  Patient presents with  . Shoulder Pain    HPI Kim Lane is a 85 y.o. female.   85 year old female comes in for right shoulder pain after fall. 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 She landed on hand and knees. Denies head injury/loss of consciousness. States she was able to finish yard work without problems post fall. However, after putting some heat to the shoulder, started having worsening pain and swelling. She denies radiation of pain. Denies numbness/tingling, loss of grip strength. She then took mobic and put ice with some relief.      Past Medical History:  Diagnosis Date  . Anemia   . Anginal pain ([place])    occ  . Arthritis   . Blood dyscrasia 12/2016   hodgins lymphoma tx-remission  . CAD (coronary artery disease) 02/2006   Taxus stent placed to LAD and Diagonal per Dr. Olevia Perches  . Cataracts, bilateral   . Complication of anesthesia   . HTN (hypertension)   . Hypercholesterolemia   . Hypothyroidism   . IBS (irritable bowel syndrome)   . Osteoarthritis   . PONV (postoperative nausea and vomiting)   . Stroke [facility]) 03/2016    Patient Active Problem List   Diagnosis Date Noted  . Degenerative disc disease, lumbar- MRI 6/ 2018 08/03/2019  . Neuropathy involving both lower extremities- due to DDD of L-Spine 08/03/2019  . Elevated serum creatinine 08/03/2019  . Prediabetes 06/20/2019  . Hyperlipidemia 06/20/2019  . History of CVA (cerebrovascular accident)- 2017-   05/31/2019  . Syncope and collapse 05/31/2019  . Blistering rash-new onset right palmar surface hand- at base esp btwn fingers 05/31/2019  . Maceration of skin 05/31/2019  . Degeneration of lumbar intervertebral disc 04/11/2019  . Lumbar radiculopathy 04/11/2019  . Pain of both hip joints 03/17/2019  . Localized, primary osteoarthritis  12/07/2018  . Cramps, muscle, general-mostly bilateral lower extremities, worse at night but not just nocturnal 12/07/2018  . Pain in finger of right hand 11/30/2018  . Sleep difficulties 09/11/2018  . Muscle cramps at night 08/10/2018  . Arthritis 08/10/2018  . Dehydration, mild 08/10/2018  . S/P kyphoplasty- dr Rolena Infante- ortho 04/05/2018  . Postmenopausal 04/05/2018  . Closed fracture of multiple ribs 04/05/2018  . Allergic contact dermatitis due to cosmetics 01/26/2018  . Trigger finger, acquired- R 3rd digit 01/26/2018  . Osteoarthritis of finger 01/26/2018  . Vitamin D deficiency 11/24/2017  . Glucose intolerance (impaired glucose tolerance) 11/24/2017  . Osteoarthritis 09/16/2017  . History of iron deficiency anemia-  by ONC 07/23/2017  . Osteoporosis 07/23/2017  . CAD S/P percutaneous coronary angioplasty 07/02/2017  . Closed compression fracture of L2 lumbar vertebra 06/24/2017  . Abnormal nuclear cardiac imaging test 06/16/2017  . Malnutrition of moderate degree 04/27/2017  . Symptomatic anemia 04/26/2017  . Hodgkin lymphoma ([place]) 04/26/2017  . Port catheter in place 02/02/2017  . Nodular sclerosis Hodgkin lymphoma of lymph nodes of neck ([place]) 01/13/2017  . Dyspnea 09/14/2012  . Epistaxis 06/02/2011  . HYPERCHOLESTEROLEMIA, MIXED 01/08/2009  . Essential hypertension, benign 01/08/2009  . Coronary atherosclerosis- sees Dr. Johnsie Cancel 01/08/2009  . Hypothyroidism 01/08/2009    Past Surgical History:  Procedure Laterality Date  . APPENDECTOMY    . IR GENERIC HISTORICAL  01/25/2017   IR US GUIDE VASC ACCESS RIGHT 01/25/2017  [name], MD [facility]  . IR GENERIC HISTORICAL  01/25/2017   IR FLUORO GUIDE PORT INSERTION RIGHT 01/25/2017 [name], MD [facility]  . IR REMOVAL TUN ACCESS W/ PORT W/O FL MOD SED  10/28/2017  . KYPHOPLASTY N/A 06/24/2017   Procedure: KYPHOPLASTY T10;  Surgeon: Melina Schools, MD;  Location: [facility];  Service: Orthopedics;  Laterality: N/A;  90  mins  . LEFT HEART CATH AND CORONARY ANGIOGRAPHY N/A 06/16/2017   Procedure: Left Heart Cath and Coronary Angiography;  Surgeon: Sherren Mocha, MD;  Location: [facility];  Service: Cardiovascular;  Laterality: N/A;  . MASS EXCISION Right 12/22/2016   Procedure: RIGHT NECK LYMPH NODE BIOPSY;  Surgeon: Rozetta Nunnery, MD;  Location: [facility];  Service: ENT;  Laterality: Right;  . SPINE SURGERY    . stents     in heart  . TONSILLECTOMY    . TOTAL KNEE ARTHROPLASTY Bilateral 2011,2012  . VAGINAL HYSTERECTOMY      OB History   No obstetric history on file.      Home Medications    Prior to Admission medications   Medication Sig Start Date End Date Taking? Authorizing Provider  aspirin 81 MG tablet Take 81 mg by mouth every other day.     [provider]  carvedilol (COREG) 3.125 MG tablet TAKE 1 TABLET BY MOUTH EVERY OTHER MORNING 11/03/19   Opalski, Neoma Laming, DO  Coenzyme Q10 100 MG capsule Take 100 mg by mouth daily.     [provider]  diclofenac sodium (VOLTAREN) 1 % GEL Apply 2 g topically 4 (four) times daily. To affected joint as needed only (substitution cream is okay) 02/23/19   Opalski, Neoma Laming, DO  levothyroxine (SYNTHROID) 88 MCG tablet TAKE 1 TABLET BY MOUTH DAILY BEFORE BREAKFAST 09/12/19   Opalski, Neoma Laming, DO  Melatonin 2.5 MG CAPS Take 1 capsule by mouth at bedtime.    [provider]  meloxicam (MOBIC) 7.5 MG tablet Take 1 tablet (7.5 mg total) by mouth daily. As needed joint/ arthritis pain 02/23/19   Opalski, Neoma Laming, DO  meloxicam (MOBIC) 7.5 MG tablet Take 1 tablet (7.5 mg total) by mouth daily. 11/10/19   Tasia Catchings, Takiah Maiden V, PA-C  Multiple Vitamins-Minerals (CENTRUM SILVER PO) Take 1 tablet by mouth daily.      [provider]  nitroGLYCERIN (NITROLINGUAL) 0.4 MG/SPRAY spray Place 1 spray under the tongue every 5 (five) minutes x 3 doses as needed for chest pain (3 sprays max).    [provider]  Omega-3  Fatty Acids (FISH OIL) 1000 MG CAPS Take 1 capsule by mouth daily.     [provider]  PROAIR HFA 108 [phone] Base) MCG/ACT inhaler Inhale 2 puffs into the lungs every 4 (four) hours as needed for shortness of breath.  10/29/16   [provider]  RESTASIS 0.05 % ophthalmic emulsion Place 2 drops into both eyes 2 (two) times daily. For dry eyes 09/19/15   [provider]  rosuvastatin (CRESTOR) 5 MG tablet Take 1 tablet (5 mg total) by mouth at bedtime. 06/20/19   Opalski, Deborah, DO  triamcinolone cream (KENALOG) 0.1 % Apply 1 application topically 2 (two) times daily. 01/26/18   [name], DO  Vitamin D, Ergocalciferol, (DRISDOL) 1.25 MG (50000 UT) CAPS capsule Take 1 capsule (50,000 Units total) by mouth every 7 (seven) days. 06/20/19   [name], DO    Family History Family History  Problem Relation Age of Onset  .  Diabetes Mother   . Dementia Mother   . ALS Mother   . Heart Problems Father   . Liver disease Sister   . Cirrhosis Sister   . Heart block Other        CABG  . Diabetes Sister   . Heart block Son        CABG  . Other [place]  . Other Daughter        [place]  . Dementia Sister   . Breast cancer Neg Hx     Social History Social History   Tobacco Use  . Smoking status: Former Research scientist (life sciences)  . Smokeless tobacco: Never Used  . Tobacco comment: quit smoking 30 years ago, very light smoker  Substance Use Topics  . Alcohol use: No    Alcohol/week: 0.0 standard drinks  . Drug use: No     Allergies   Fosamax [alendronate sodium]   Review of Systems Review of Systems  Reason unable to perform ROS: See HPI as above.     Physical Exam Triage Vital Signs ED Triage Vitals [11/10/19 0859]  Enc Vitals Group     BP (!) 162/88     Pulse Rate 72     Resp 18     Temp 98.3 F (36.8 C)     Temp Source Oral     SpO2 98 %     Weight      Height      Head Circumference      Peak Flow      Pain Score 5     Pain  Loc      Pain Edu?      Excl. in [place]?    No data found.  Updated Vital Signs BP (!) 162/88 (BP Location: Left Arm)   Pulse 72   Temp 98.3 F (36.8 C) (Oral)   Resp 18   SpO2 98%   Physical Exam Constitutional:      General: She is not in acute distress.    Appearance: She is well-developed. She is not diaphoretic.  HENT:     Head: Normocephalic and atraumatic.  Eyes:     Conjunctiva/sclera: Conjunctivae normal.     Pupils: Pupils are equal, round, and reactive to light.  Pulmonary:     Effort: Pulmonary effort is normal. No respiratory distress.  Musculoskeletal:     Comments: Swelling to anterior right shoulder. No erythema, warmth, contusion. No tenderness to palpation of neck/thoracic back. No tenderness to palpation of clavicle. Tenderness to palpation of right shoulder diffusely. Decreased abduction of right shoulder. Full ROM of neck, elbow, wrist, fingers. Strength deferred on shoulder. Strength normal and equal bilaterally of neck, elbow, wrist. Normal grip strength. Sensation intact and equal bilaterally. Radial pulse 2+, cap refill <2s  Skin:    General: Skin is warm and dry.  Neurological:     Mental Status: She is alert and oriented to person, place, and time.      UC Treatments / Results  Labs (all labs ordered are listed, but only abnormal results are displayed) Labs Reviewed - No data to display  EKG   Radiology Dg Shoulder Right  Result Date: 11/10/2019 CLINICAL DATA:  Status post fall, right shoulder pain EXAM: RIGHT SHOULDER - 2+ VIEW COMPARISON:  None. FINDINGS: There is no evidence of fracture or dislocation. There is generalized osteopenia. There is mild arthropathy of the acromioclavicular joint. Soft tissues are unremarkable. IMPRESSION: No  acute osseous injury of the right shoulder. Electronically Signed   By: Kathreen Devoid   On: 11/10/2019 09:26    Procedures Procedures (including critical care time)  Medications Ordered in UC Medications -  No data to display  Initial Impression / Assessment and Plan / UC Course  I have reviewed the triage vital signs and the nursing notes.  Pertinent labs & imaging results that were available during my care of the patient were reviewed by me and considered in my medical decision making (see chart for details).    X-ray negative for fracture or dislocation.  Will have patient continue NSAIDs, ice compress, rest.  Return precautions given.  Patient expresses understanding and agrees to plan.  Final Clinical Impressions(s) / UC Diagnoses   Final diagnoses:  Acute pain of right shoulder  Fall, initial encounter   ED Prescriptions    None     PDMP not reviewed this encounter.   Ok Edwards, PA-C 11/10/19 1014

## 2019-11-10 NOTE — Discharge Instructions (Addendum)
Xray negative for fracture or dislocation. As discussed, this does not rule out injury to the ligaments, rotator cuff. At this time, continue your Mobic as needed. Ice compress to the shoulder. Follow up with orthopedics for further evaluation if symptoms not improving after 1 week.

## 2019-11-13 ENCOUNTER — Other Ambulatory Visit: Payer: Self-pay

## 2019-11-13 ENCOUNTER — Other Ambulatory Visit: Payer: Medicare Other

## 2019-11-13 DIAGNOSIS — E7800 Pure hypercholesterolemia, unspecified: Secondary | ICD-10-CM

## 2019-11-13 DIAGNOSIS — E559 Vitamin D deficiency, unspecified: Secondary | ICD-10-CM

## 2019-11-13 DIAGNOSIS — R7302 Impaired glucose tolerance (oral): Secondary | ICD-10-CM

## 2019-11-13 DIAGNOSIS — I10 Essential (primary) hypertension: Secondary | ICD-10-CM

## 2019-11-13 DIAGNOSIS — R7303 Prediabetes: Secondary | ICD-10-CM

## 2019-11-13 DIAGNOSIS — R7989 Other specified abnormal findings of blood chemistry: Secondary | ICD-10-CM

## 2019-11-13 DIAGNOSIS — E039 Hypothyroidism, unspecified: Secondary | ICD-10-CM

## 2019-11-14 LAB — LIPID PANEL
Chol/HDL Ratio: 3.7 ratio (ref 0.0–4.4)
Cholesterol, Total: 232 mg/dL — ABNORMAL HIGH (ref 100–199)
HDL: 62 mg/dL (ref >39)
LDL Chol Calc (NIH): 153 mg/dL — ABNORMAL HIGH (ref 0–99)
Triglycerides: 98 mg/dL (ref 0–149)
VLDL Cholesterol Cal: 17 mg/dL (ref 5–40)

## 2019-11-14 LAB — CBC WITH DIFFERENTIAL/PLATELET
Basophils Absolute: 0.1 10*3/uL (ref 0.0–0.2)
Basos: 2 %
EOS (ABSOLUTE): 0.7 10*3/uL — ABNORMAL HIGH (ref 0.0–0.4)
Eos: 10 %
Hematocrit: 38.2 % (ref 34.0–46.6)
Hemoglobin: 12.6 g/dL (ref 11.1–15.9)
Immature Grans (Abs): 0 10*3/uL (ref 0.0–0.1)
Immature Granulocytes: 1 %
Lymphocytes Absolute: 1.6 10*3/uL (ref 0.7–3.1)
Lymphs: 24 %
MCH: 28.9 pg (ref 26.6–33.0)
MCHC: 33 g/dL (ref 31.5–35.7)
MCV: 88 fL (ref 79–97)
Monocytes Absolute: 0.6 10*3/uL (ref 0.1–0.9)
Monocytes: 9 %
Neutrophils Absolute: 3.8 10*3/uL (ref 1.4–7.0)
Neutrophils: 54 %
Platelets: 386 10*3/uL (ref 150–450)
RBC: 4.36 x10E6/uL (ref 3.77–5.28)
RDW: 12.9 % (ref 11.7–15.4)
WBC: 6.8 10*3/uL (ref 3.4–10.8)

## 2019-11-14 LAB — COMPREHENSIVE METABOLIC PANEL
ALT: 20 IU/L (ref 0–32)
AST: 29 IU/L (ref 0–40)
Albumin/Globulin Ratio: 1.8 (ref 1.2–2.2)
Albumin: 4.2 g/dL (ref 3.6–4.6)
Alkaline Phosphatase: 50 IU/L (ref 39–117)
BUN/Creatinine Ratio: 12 (ref 12–28)
BUN: 12 mg/dL (ref 8–27)
Bilirubin Total: 0.5 mg/dL (ref 0.0–1.2)
CO2: 28 mmol/L (ref 20–29)
Calcium: 9.2 mg/dL (ref 8.7–10.3)
Chloride: 106 mmol/L (ref 96–106)
Creatinine, Ser: 1.03 mg/dL — ABNORMAL HIGH (ref 0.57–1.00)
GFR calc Af Amer: 58 mL/min/{1.73_m2} — ABNORMAL LOW (ref >59)
GFR calc non Af Amer: 50 mL/min/{1.73_m2} — ABNORMAL LOW (ref >59)
Globulin, Total: 2.3 g/dL (ref 1.5–4.5)
Glucose: 122 mg/dL — ABNORMAL HIGH (ref 65–99)
Potassium: 4.9 mmol/L (ref 3.5–5.2)
Sodium: 144 mmol/L (ref 134–144)
Total Protein: 6.5 g/dL (ref 6.0–8.5)

## 2019-11-14 LAB — VITAMIN D 25 HYDROXY (VIT D DEFICIENCY, FRACTURES): Vit D, 25-Hydroxy: 70.6 ng/mL (ref 30.0–100.0)

## 2019-11-14 LAB — HEMOGLOBIN A1C
Est. average glucose Bld gHb Est-mCnc: 126 mg/dL
Hgb A1c MFr Bld: 6 % — ABNORMAL HIGH (ref 4.8–5.6)

## 2019-11-14 LAB — T3: T3, Total: 87 ng/dL (ref 71–180)

## 2019-11-14 LAB — T4, FREE: Free T4: 1.04 ng/dL (ref 0.82–1.77)

## 2019-11-14 LAB — TSH: TSH: 8.23 u[IU]/mL — ABNORMAL HIGH (ref 0.450–4.500)

## 2019-11-20 ENCOUNTER — Ambulatory Visit: Payer: Medicare Other | Admitting: Family Medicine

## 2019-11-22 ENCOUNTER — Other Ambulatory Visit: Payer: Self-pay | Admitting: Family Medicine

## 2019-11-22 DIAGNOSIS — G8929 Other chronic pain: Secondary | ICD-10-CM

## 2019-11-22 DIAGNOSIS — M159 Polyosteoarthritis, unspecified: Secondary | ICD-10-CM

## 2019-11-22 DIAGNOSIS — M629 Disorder of muscle, unspecified: Secondary | ICD-10-CM

## 2019-11-22 DIAGNOSIS — M25552 Pain in left hip: Secondary | ICD-10-CM

## 2019-11-28 ENCOUNTER — Ambulatory Visit: Payer: Medicare Other | Admitting: Family Medicine

## 2019-12-12 ENCOUNTER — Ambulatory Visit (INDEPENDENT_AMBULATORY_CARE_PROVIDER_SITE_OTHER): Payer: Medicare Other | Admitting: Family Medicine

## 2019-12-12 ENCOUNTER — Other Ambulatory Visit: Payer: Self-pay

## 2019-12-12 ENCOUNTER — Encounter: Payer: Self-pay | Admitting: Family Medicine

## 2019-12-12 VITALS — BP 131/85 | HR 85 | Ht 61.0 in | Wt 130.0 lb

## 2019-12-12 DIAGNOSIS — M159 Polyosteoarthritis, unspecified: Secondary | ICD-10-CM

## 2019-12-12 DIAGNOSIS — N1831 Chronic kidney disease, stage 3a: Secondary | ICD-10-CM | POA: Insufficient documentation

## 2019-12-12 DIAGNOSIS — C8190 Hodgkin lymphoma, unspecified, unspecified site: Secondary | ICD-10-CM

## 2019-12-12 DIAGNOSIS — M25552 Pain in left hip: Secondary | ICD-10-CM

## 2019-12-12 DIAGNOSIS — I10 Essential (primary) hypertension: Secondary | ICD-10-CM

## 2019-12-12 DIAGNOSIS — I2510 Atherosclerotic heart disease of native coronary artery without angina pectoris: Secondary | ICD-10-CM

## 2019-12-12 DIAGNOSIS — Z5181 Encounter for therapeutic drug level monitoring: Secondary | ICD-10-CM

## 2019-12-12 DIAGNOSIS — E559 Vitamin D deficiency, unspecified: Secondary | ICD-10-CM

## 2019-12-12 DIAGNOSIS — Z8673 Personal history of transient ischemic attack (TIA), and cerebral infarction without residual deficits: Secondary | ICD-10-CM

## 2019-12-12 DIAGNOSIS — E039 Hypothyroidism, unspecified: Secondary | ICD-10-CM

## 2019-12-12 DIAGNOSIS — E7800 Pure hypercholesterolemia, unspecified: Secondary | ICD-10-CM

## 2019-12-12 DIAGNOSIS — Z9861 Coronary angioplasty status: Secondary | ICD-10-CM

## 2019-12-12 DIAGNOSIS — R7303 Prediabetes: Secondary | ICD-10-CM

## 2019-12-12 DIAGNOSIS — G8929 Other chronic pain: Secondary | ICD-10-CM

## 2019-12-12 DIAGNOSIS — M25551 Pain in right hip: Secondary | ICD-10-CM

## 2019-12-12 MED ORDER — DICLOFENAC SODIUM 1 % EX GEL: 2.0000 g | Freq: Four times a day (QID) | CUTANEOUS | 0 refills | Status: AC

## 2019-12-12 MED ORDER — ROSUVASTATIN CALCIUM 5 MG PO TABS: 5.0000 mg | ORAL_TABLET | Freq: Every day | ORAL | 0 refills | Status: DC

## 2019-12-12 MED ORDER — LEVOTHYROXINE SODIUM 88 MCG PO TABS: 88.0000 ug | ORAL_TABLET | Freq: Every day | ORAL | 0 refills | Status: DC

## 2019-12-12 MED ORDER — HM VITAMIN D3 100 MCG (4000 UT) PO CAPS: ORAL_CAPSULE | ORAL | Status: AC

## 2019-12-12 NOTE — Progress Notes (Signed)
Telehealth office visit note for Kim Lane, D.O- at Primary Care at [facility]   I connected with current patient today and verified that I am speaking with the correct person using two identifiers.   . Location of the patient: Home . Location of the provider: Office Only the patient (+/- their family members at pt's discretion) and myself were participating in the encounter - This visit type was conducted due to national recommendations for restrictions regarding the COVID-19 Pandemic (e.g. social distancing) in an effort to limit this patient's exposure and mitigate transmission in our community.  This format is felt to be most appropriate for this patient at this time.   - The patient did not have access to video technology or had technical difficulties with video requiring transitioning to audio format only. - No physical exam could be performed with this format, beyond that communicated to [place] by the patient/ family members as noted.   - Additionally my office staff/ schedulers discussed with the patient that there may be a monetary charge related to this service, depending on their medical insurance.   The patient expressed understanding, and agreed to proceed.       History of Present Illness: Follow-up (discuss labs)   I, Kim Lane, am serving as scribe for Dr. Mellody Lane.  Notes doing pretty well aside from some arthritis pain.  - Recent Fall States she fell the week before Thanksgiving and hurt her arm.  She went to Urgent Care for assessment.  Says she was outside and had been on the lawnmower, took two or three steps after dismounting, and "just fell forward."  Notes her shoulder wasn't broken; she had several X-rays obtained.  Patient had an appointment with Kim Lane for follow-up, and was told her shoulder is jammed.  She is using ice 3-4 times per day for relief, and also taking tylenol.  They gave her a cortisone injection in the shoulder and  did not refill her meloxicam.  Reports that her last cortisone injection was with Dr. Percell Lane, December 4th, in her shoulder.  - Arthritis Pain, especially in hips Notes she desires a refill for the meloxicam.  States "since April, I've taken 30 pills, and they wrote this prescription for 10 in the Urgent Care."  States she had not gotten her original prescription refilled.  Patient obtained physical therapy for relief in the past.  Her current arthritis pain is located mainly in her hips, and now the shoulder since her recent injury.  Says her shoulder pain is improving; not as bad as it was initially, but still hurts.  Says she "hasn't had any meloxicam in quite a while anyway," and that tylenol works to manage it.  Notes she has cramps in her feet sometimes at night.  Says she's been striving to drink a lot of water.  Says "some nights I don't have it at all, some nights I do."  - Vitamin D Deficiency Notes she had been taking her weekly Vitamin D twice weekly instead of once weekly for a while, because this is how she was told to take it.  - Hypothyroidism Per patient, asymptomatic at this time.  HPI:  Hyperlipidemia:  85 y.o. female here for cholesterol follow-up.   - Patient reports poor compliance with medication.  Notes "I took all of it up and didn't have it refilled."  Notes "I took 90 pills, that's three months."  Notes she quit taking cholesterol meds and started using apple  cide vinegar in the past.  - She denies new onset of: myalgias, arthralgias, increased fatigue more than normal, chest pains, exercise intolerance, shortness of breath, dizziness, visual changes, headache, lower extremity swelling or claudication.   Most recent cholesterol panel was:  Lab Results  Component Value Date   CHOL 232 (H) 11/13/2019   HDL 62 11/13/2019   LDLCALC 153 (H) 11/13/2019   TRIG 98 11/13/2019   CHOLHDL 3.7 11/13/2019   Hepatic Function Latest Ref Rng & Units 11/13/2019  11/06/2019 07/31/2019  Total Protein 6.0 - 8.5 g/dL 6.5 7.5 6.6  Albumin 3.6 - 4.6 g/dL 4.2 4.3 4.4  AST 0 - 40 IU/L _0 ALT 0 - 32 IU/L _1 Alk Phosphatase 39 - 117 IU/L 50 48 51  Total Bilirubin 0.0 - 1.2 mg/dL 0.5 0.5 0.5  Bilirubin, Direct 0.0 - 0.3 mg/dL - - -   HPI:  Hypertension:  -  Her blood pressure at home has been running: 131/85 today.  - Patient reports good compliance with medication and/or lifestyle modification  - Her denies acute concerns or problems related to treatment plan  - She denies new onset of: chest pain, exercise intolerance, shortness of breath, dizziness, visual changes, headache, lower extremity swelling or claudication.   Last 3 blood pressure readings in our office are as follows: BP Readings from Last 3 Encounters:  12/12/19 131/85  11/10/19 (!) 162/88  11/06/19 138/77   Filed Weights   12/12/19 1034  Weight: 130 lb (59 kg)   HPI:  Prediabetes  Says she's been eating more sweets lately than she has in years.  Last A1C in the office was:  Lab Results  Component Value Date   HGBA1C 6.0 (H) 11/13/2019   HGBA1C 6.1 (H) 06/15/2019   HGBA1C 6.0 (H) 10/07/2018   Lab Results  Component Value Date   LDLCALC 153 (H) 11/13/2019   CREATININE 1.03 (H) 11/13/2019   BP Readings from Last 3 Encounters:  12/12/19 131/85  11/10/19 (!) 162/88  11/06/19 138/77   Wt Readings from Last 3 Encounters:  12/12/19 130 lb (59 kg)  11/06/19 130 lb 8 oz (59.2 kg)  08/29/19 135 lb (61.2 kg)     GAD 7 : Generalized Anxiety Score 08/03/2019  Nervous, Anxious, on Edge 0  Control/stop worrying 0  Worry too much - different things 0  Trouble relaxing 0  Restless 0  Easily annoyed or irritable 0  Afraid - awful might happen 0  Total GAD 7 Score 0  Anxiety Difficulty Not difficult at all    Depression screen [facility] 2/9 08/03/2019 02/23/2019 12/07/2018 10/10/2018 08/10/2018  Decreased Interest 1 0 0 0 0  Down, Depressed, Hopeless 0 1 0 1 0  PHQ -  2 Score 1 1 0 1 0  Altered sleeping _2 Tired, decreased energy _3 Change in appetite _4 0  Feeling bad or failure about yourself  0 0 0 0 0  Trouble concentrating 0 0 0 0 0  Moving slowly or fidgety/restless 0 0 0 0 0  Suicidal thoughts 0 0 0 0 0  PHQ-9 Score _5 Difficult doing work/chores - - Somewhat difficult Not difficult at all -  Some recent data might be hidden      Impression and Recommendations:    1. Stage 3a chronic kidney disease   2. Atherosclerosis of native coronary artery of  native heart without angina pectoris   3. HYPERCHOLESTEROLEMIA, MIXED   4. Chronic pain of both hips- R >> L   5. Generalized OA   6. Essential hypertension, benign   7. Prediabetes   8. Hodgkin lymphoma, unspecified Hodgkin lymphoma type, unspecified body region ([place])   9. History of CVA (cerebrovascular accident)- 2017-     10. CAD S/P percutaneous coronary angioplasty   11. Vitamin D deficiency   12. Hypothyroidism, unspecified type      - Reviewed recent lab work (11/13/2019) in depth with patient today.  All lab work within normal limits unless otherwise noted.  Generalized Osteoarthritis, Chronic Pain of both hips, R >> L - Reviewed patient's symptoms extensively during appointment today. - Discussed need for patient to avoid daily chronic use of meloxicam.  - Discussed risks associated with chronic use of meloxicam. - Patient knows to discontinue meloxicam.  - Advised patient to use voltaren gel as an alternative on joints in need of relief. - Told patient to apply to site of affected joint PRN for pain.  - Encouraged patient to ask Orthopedics regarding further options, such as patch or injection. - Patient knows to consult with specialist regarding questions or concerns.  Hypercholesterolemia - Started Crestor in June 2020. - Statin was written on 06/20/2019, 90 pills with one refill. - Patient did not obtain a refill and stopped taking statin  after 90 days. - Patient did tolerate statin well.  Denies S-E.  - As patient stopped taking her statin after 90 days and did not obtain a refill, her current cholesterol labs do not reflect being managed on medicine full-time.  Triglycerides = 98, up from 87 prior. HDL = 62, down from 73 prior. LDL = 153, elevated, down from 176 prior  - Told patient to resume statin management as prescribed. - Reviewed importance of taking statin every night and obtaining all refills as advised.  - Prudent dietary changes such as low saturated & trans fat diets for hyperlipidemia and low carb/ ketogenic diets for hypertriglyceridemia discussed with patient.    Encouraged patient to follow AHA guidelines for regular exercise and also engage in weight loss if BMI above 25.   Educational handouts provided at patient's desire and/ or told to look online at the [employer] website for further information.  We will continue to monitor  Prediabetes - A1c down to 6.0 from 6.1 prior, stable.  - Counseled patient on prevention of disease and discussed dietary and lifestyle modification as first line.   - Importance of low carb, heart-healthy diet discussed with patient in addition to regular aerobic exercise of 56mn 5d/week or more.   - Will continue to monitor.  Essential Hypertension - Blood pressure currently is at goal. - Patient will continue current treatment regimen.  - Told patient to touch base with cardiology once or twice yearly-as often as they recommend. - Encouraged patient to call cardiology and ask about upcoming appointment in March.  - Counseled patient on pathophysiology of disease and discussed various treatment options, which always includes dietary and lifestyle modification as first line.   - Lifestyle changes such as dash and heart healthy diets and engaging in a regular exercise program discussed extensively with patient.   - Ambulatory blood pressure monitoring  encouraged at least 3 times weekly.  Keep log and bring in every office visit.  Reminded patient that if they ever feel poorly in any way, to check their blood pressure and pulse.  - Handouts  provided at patient's desire and/or told to go online at the [place] website for further information  - We will continue to monitor.  Elevated TSH - TSH = 8.230, elevated last check. - Free T3, Free T4 WNL.  - Per patient, she is asymptomatic at this time. - Patient knows to monitor for symptoms.  - Otherwise, continue synthroid as established.  See med list. - Re-check in 2-3 months as discussed.  Vitamin D Deficiency - 70.6 last check, down from 84.2 prior. - Per patient, was taking two pills per week, and reduced to once-weekly.  - Discontinue once-weekly Vitamin D. - Patient will obtain OTC Vitamin D, 4000 daily.  See med list.  - Will continue to monitor and re-check as discussed.  Stage 3a Chronic Kidney Disease - Stable at this time. - Serum creatinine stable at 1.03 last check, unchanged from 1.03 prior. - GFR stable at 50 last check, unchanged from 50 prior.  - To help improve and maintain kidney health, encouraged patient to engage in daily physical activity, hydrate adequately, keep blood pressure and blood sugar under control.  - Will continue to monitor.  Recommendations - Re-check FLP in 2 months after regular statin management.  - As part of my medical decision making, I reviewed the following data within the [place] History obtained from pt /family, CMA notes reviewed and incorporated if applicable, Labs reviewed, Radiograph/ tests reviewed if applicable and OV notes from prior OV's with me, as well as other specialists she/he has seen since seeing me last, were all reviewed and used in my medical decision making process today.    - Additionally, discussion had with patient regarding our treatment plan, and their biases/concerns about  that plan were used in my medical decision making today.    - The patient agreed with the plan and demonstrated an understanding of the instructions.   No barriers to understanding were identified.    - Red flag symptoms and signs discussed in detail.  Patient expressed understanding regarding what to do in case of emergency\ urgent symptoms.   - The patient was advised to call back or seek an in-person evaluation if the symptoms worsen or if the condition fails to improve as anticipated.   Return for re-check blood work, FLP, TSH in 2 months; OV in 2-4 months..    Orders Placed This Encounter  Procedures  . Lipid panel    Meds ordered this encounter  Medications  . diclofenac Sodium (VOLTAREN) 1 % GEL    Sig: Apply 2 g topically 4 (four) times daily. To affected joint for pain prn    Dispense:  100 g    Refill:  0  . rosuvastatin (CRESTOR) 5 MG tablet    Sig: Take 1 tablet (5 mg total) by mouth at bedtime.    Dispense:  90 tablet    Refill:  0  . Cholecalciferol (HM VITAMIN D3) 100 MCG (4000 UT) CAPS    Sig: 4,000 IU QD    Dispense:  30 capsule  . levothyroxine (SYNTHROID) 88 MCG tablet    Sig: Take 1 tablet (88 mcg total) by mouth daily before breakfast.    Dispense:  90 tablet    Refill:  0    Medications Discontinued During This Encounter  Medication Reason  . meloxicam (MOBIC) 7.5 MG tablet   . diclofenac sodium (VOLTAREN) 1 % GEL   . meloxicam (MOBIC) 7.5 MG tablet   . rosuvastatin (CRESTOR) 5 MG  tablet Reorder  . Vitamin D, Ergocalciferol, (DRISDOL) 1.25 MG (50000 UT) CAPS capsule   . levothyroxine (SYNTHROID) 88 MCG tablet Reorder      I provided 35 minutes of non face-to-face time during this encounter.  Additional time was spent with charting and coordination of care before and after the actual visit commenced.   Note:  This note was prepared with assistance of Dragon voice recognition software. Occasional wrong-word or sound-a-like substitutions may have  occurred due to the inherent limitations of voice recognition software.  This document serves as a record of services personally performed by Kim Dance, DO. It was created on her behalf by Kim Lane, a trained medical scribe. The creation of this record is based on the scribe's personal observations and the provider's statements to them.   This case required medical decision making of at least moderate complexity. The above documentation has been reviewed to be accurate and was completed by Marjory Sneddon, D.O.       Patient Care Team    Relationship Specialty Notifications Start End  Kim Dance, DO PCP - General Family Medicine  07/02/17   Josue Hector, MD PCP - Cardiology Cardiology Admissions 09/16/18   Josue Hector, MD Consulting Physician Cardiology  07/02/17   Penni Bombard, MD Consulting Physician Neurology  07/02/17   Jarome Matin, MD Consulting Physician Dermatology  07/02/17   Rozetta Nunnery, MD Consulting Physician Otolaryngology  07/02/17   Brunetta Genera, MD Consulting Physician Hematology  07/02/17   Melina Schools, MD Consulting Physician Orthopedic Surgery  07/02/17   Renette Butters, MD Attending Physician Orthopedic Surgery  12/12/19      -Vitals obtained; medications/ allergies reconciled;  personal medical, social, Sx etc.histories were updated by CMA, reviewed by me and are reflected in chart   Patient Active Problem List   Diagnosis Date Noted  . Glucose intolerance (impaired glucose tolerance) 11/24/2017  . Hodgkin lymphoma ([place]) 04/26/2017  . Essential hypertension, benign 01/08/2009  . Coronary atherosclerosis- sees Dr. Johnsie Cancel 01/08/2009  . Closed compression fracture of L2 lumbar vertebra 06/24/2017  . Symptomatic anemia 04/26/2017  . HYPERCHOLESTEROLEMIA, MIXED 01/08/2009  . Hypothyroidism 01/08/2009  . Osteoporosis 07/23/2017  . CAD S/P percutaneous coronary angioplasty 07/02/2017  . Stage 3a chronic  kidney disease 12/12/2019  . Degenerative disc disease, lumbar- MRI 6/ 2018 08/03/2019  . Neuropathy involving both lower extremities- due to DDD of L-Spine 08/03/2019  . Elevated serum creatinine 08/03/2019  . Prediabetes 06/20/2019  . Hyperlipidemia 06/20/2019  . History of CVA (cerebrovascular accident)- 2017-   05/31/2019  . Syncope and collapse 05/31/2019  . Blistering rash-new onset right palmar surface hand- at base esp btwn fingers 05/31/2019  . Maceration of skin 05/31/2019  . Degeneration of lumbar intervertebral disc 04/11/2019  . Lumbar radiculopathy 04/11/2019  . Pain of both hip joints 03/17/2019  . Localized, primary osteoarthritis 12/07/2018  . Cramps, muscle, general-mostly bilateral lower extremities, worse at night but not just nocturnal 12/07/2018  . Pain in finger of right hand 11/30/2018  . Sleep difficulties 09/11/2018  . Muscle cramps at night 08/10/2018  . Arthritis 08/10/2018  . Dehydration, mild 08/10/2018  . S/P kyphoplasty- dr Rolena Infante- ortho 04/05/2018  . Postmenopausal 04/05/2018  . Closed fracture of multiple ribs 04/05/2018  . Allergic contact dermatitis due to cosmetics 01/26/2018  . Trigger finger, acquired- R 3rd digit 01/26/2018  . Osteoarthritis of finger 01/26/2018  . Vitamin D deficiency 11/24/2017  . Osteoarthritis 09/16/2017  . History  of iron deficiency anemia-  by ONC 07/23/2017  . Abnormal nuclear cardiac imaging test 06/16/2017  . Malnutrition of moderate degree 04/27/2017  . Port catheter in place 02/02/2017  . 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 Dyspnea 09/14/2012  . Epistaxis 06/02/2011     Current Meds  Medication Sig  . aspirin 81 MG tablet Take 81 mg by mouth every other day.   . carvedilol (COREG) 3.125 MG tablet TAKE 1 TABLET BY MOUTH EVERY OTHER MORNING  . Coenzyme Q10 100 MG capsule Take 100 mg by mouth daily.   [name] Kitchen levothyroxine (SYNTHROID) 88 MCG tablet Take 1 tablet (88 mcg  total) by mouth daily before breakfast.  . Melatonin 2.5 MG CAPS Take 1 capsule by mouth at bedtime.  . Multiple Vitamins-Minerals (CENTRUM SILVER PO) Take 1 tablet by mouth daily.    . nitroGLYCERIN (NITROLINGUAL) 0.4 MG/SPRAY spray Place 1 spray under the tongue every 5 (five) minutes x 3 doses as needed for chest pain (3 sprays max).  . Omega-3 Fatty Acids (FISH OIL) 1000 MG CAPS Take 1 capsule by mouth daily.   . RESTASIS 0.05 % ophthalmic emulsion Place 2 drops into both eyes 2 (two) times daily. For dry eyes  . rosuvastatin (CRESTOR) 5 MG tablet Take 1 tablet (5 mg total) by mouth at bedtime.  . [DISCONTINUED] levothyroxine (SYNTHROID) 88 MCG tablet TAKE 1 TABLET BY MOUTH DAILY BEFORE BREAKFAST  . [DISCONTINUED] meloxicam (MOBIC) 7.5 MG tablet Take 1 tablet (7.5 mg total) by mouth daily. As needed joint/ arthritis pain  . [DISCONTINUED] rosuvastatin (CRESTOR) 5 MG tablet Take 1 tablet (5 mg total) by mouth at bedtime.  . [DISCONTINUED] Vitamin D, Ergocalciferol, (DRISDOL) 1.25 MG (50000 UT) CAPS capsule Take 1 capsule (50,000 Units total) by mouth every 7 (seven) days.     Allergies:  Allergies  Allergen Reactions  . Fosamax [Alendronate Sodium] Other (See Comments)    "made me ache all over"     ROS:  See above HPI for pertinent positives and negatives   Objective:   Blood pressure 131/85, pulse 85, height _0  (1.549 m), weight 130 lb (59 kg).  (if some vitals are omitted, this means that patient was UNABLE to obtain them even though they were asked to get them prior to OV today.  They were asked to call us at their earliest convenience with these once obtained. )  General: A & O * 3; sounds in no acute distress; in usual state of health.  Skin: Pt confirms warm and dry extremities and pink fingertips HEENT: Pt confirms lips non-cyanotic Chest: Patient confirms normal chest excursion and movement Respiratory: speaking in full sentences, no conversational dyspnea; patient  confirms no use of accessory muscles Psych: insight appears good, mood- appears full

## 2019-12-22 DIAGNOSIS — Z20828 Contact with and (suspected) exposure to other viral communicable diseases: Secondary | ICD-10-CM | POA: Diagnosis not present

## 2019-12-22 DIAGNOSIS — J3489 Other specified disorders of nose and nasal sinuses: Secondary | ICD-10-CM | POA: Diagnosis not present

## 2019-12-28 DIAGNOSIS — H52223 Regular astigmatism, bilateral: Secondary | ICD-10-CM | POA: Diagnosis not present

## 2019-12-28 DIAGNOSIS — H5203 Hypermetropia, bilateral: Secondary | ICD-10-CM | POA: Diagnosis not present

## 2019-12-28 DIAGNOSIS — H04123 Dry eye syndrome of bilateral lacrimal glands: Secondary | ICD-10-CM | POA: Diagnosis not present

## 2020-01-04 ENCOUNTER — Other Ambulatory Visit: Payer: Self-pay | Admitting: *Deleted

## 2020-01-04 DIAGNOSIS — C8190 Hodgkin lymphoma, unspecified, unspecified site: Secondary | ICD-10-CM

## 2020-01-05 ENCOUNTER — Ambulatory Visit: Payer: Medicare Other

## 2020-01-05 ENCOUNTER — Inpatient Hospital Stay: Payer: Medicare PPO

## 2020-01-05 ENCOUNTER — Other Ambulatory Visit: Payer: Self-pay

## 2020-01-05 ENCOUNTER — Inpatient Hospital Stay: Payer: Medicare PPO | Attending: Hematology

## 2020-01-05 VITALS — BP 132/72 | HR 68 | Temp 98.2°F | Resp 18

## 2020-01-05 DIAGNOSIS — M818 Other osteoporosis without current pathological fracture: Secondary | ICD-10-CM | POA: Diagnosis not present

## 2020-01-05 DIAGNOSIS — Z95828 Presence of other vascular implants and grafts: Secondary | ICD-10-CM

## 2020-01-05 DIAGNOSIS — C8190 Hodgkin lymphoma, unspecified, unspecified site: Secondary | ICD-10-CM

## 2020-01-05 DIAGNOSIS — C8118 Nodular sclerosis classical Hodgkin lymphoma, lymph nodes of multiple sites: Secondary | ICD-10-CM | POA: Diagnosis not present

## 2020-01-05 LAB — CMP (CANCER CENTER ONLY)
ALT: 19 U/L (ref 0–44)
AST: 23 U/L (ref 15–41)
Albumin: 3.9 g/dL (ref 3.5–5.0)
Alkaline Phosphatase: 53 U/L (ref 38–126)
Anion gap: 8 (ref 5–15)
BUN: 12 mg/dL (ref 8–23)
CO2: 29 mmol/L (ref 22–32)
Calcium: 8.7 mg/dL — ABNORMAL LOW (ref 8.9–10.3)
Chloride: 104 mmol/L (ref 98–111)
Creatinine: 1 mg/dL (ref 0.44–1.00)
GFR, Est AFR Am: >60 mL/min (ref >60)
GFR, Estimated: 52 mL/min — ABNORMAL LOW (ref >60)
Glucose, Bld: 101 mg/dL — ABNORMAL HIGH (ref 70–99)
Potassium: 4.2 mmol/L (ref 3.5–5.1)
Sodium: 141 mmol/L (ref 135–145)
Total Bilirubin: 0.5 mg/dL (ref 0.3–1.2)
Total Protein: 6.7 g/dL (ref 6.5–8.1)

## 2020-01-05 LAB — CBC WITH DIFFERENTIAL (CANCER CENTER ONLY)
Abs Immature Granulocytes: 0.03 10*3/uL (ref 0.00–0.07)
Basophils Absolute: 0.1 10*3/uL (ref 0.0–0.1)
Basophils Relative: 2 %
Eosinophils Absolute: 0.7 10*3/uL — ABNORMAL HIGH (ref 0.0–0.5)
Eosinophils Relative: 9 %
HCT: 39.4 % (ref 36.0–46.0)
Hemoglobin: 12.7 g/dL (ref 12.0–15.0)
Immature Granulocytes: 0 %
Lymphocytes Relative: 18 %
Lymphs Abs: 1.5 10*3/uL (ref 0.7–4.0)
MCH: 29.6 pg (ref 26.0–34.0)
MCHC: 32.2 g/dL (ref 30.0–36.0)
MCV: 91.8 fL (ref 80.0–100.0)
Monocytes Absolute: 0.9 10*3/uL (ref 0.1–1.0)
Monocytes Relative: 10 %
Neutro Abs: 5.2 10*3/uL (ref 1.7–7.7)
Neutrophils Relative %: 61 %
Platelet Count: 323 10*3/uL (ref 150–400)
RBC: 4.29 MIL/uL (ref 3.87–5.11)
RDW: 14.5 % (ref 11.5–15.5)
WBC Count: 8.5 10*3/uL (ref 4.0–10.5)
nRBC: 0 % (ref 0.0–0.2)

## 2020-01-05 LAB — SEDIMENTATION RATE: Sed Rate: 10 mm/hr (ref 0–22)

## 2020-01-05 MED ORDER — DENOSUMAB 60 MG/ML ~~LOC~~ SOSY
60.0000 mg | PREFILLED_SYRINGE | Freq: Once | SUBCUTANEOUS | Status: AC
  Administered 2020-01-05: 60 mg via SUBCUTANEOUS

## 2020-01-05 MED ORDER — DENOSUMAB 60 MG/ML ~~LOC~~ SOSY
PREFILLED_SYRINGE | SUBCUTANEOUS | Status: AC
  Filled 2020-01-05: qty 1

## 2020-01-05 NOTE — Patient Instructions (Signed)

## 2020-01-05 NOTE — Progress Notes (Signed)
Ok to give xgeva with calcium of 8.7 today per Dr. [name]

## 2020-01-20 ENCOUNTER — Ambulatory Visit: Payer: Medicare Other

## 2020-01-26 ENCOUNTER — Ambulatory Visit: Payer: Medicare PPO | Attending: Internal Medicine

## 2020-01-26 DIAGNOSIS — Z23 Encounter for immunization: Secondary | ICD-10-CM

## 2020-01-26 NOTE — Progress Notes (Signed)
   Covid-19 Vaccination Clinic  Name:  Kim Lane    MRN: [id number] DOB: 01-30-1934  01/26/2020  Kim Lane was observed post Covid-19 immunization for 15 minutes without incidence. She was provided with Vaccine Information Sheet and instruction to access the V-Safe system.   Kim Lane was instructed to call 911 with any severe reactions post vaccine: [name] Kitchen Difficulty breathing  . Swelling of your face and throat  . A fast heartbeat  . A bad rash all over your body  . Dizziness and weakness    Immunizations Administered    Name Date Dose VIS Date Route   Pfizer COVID-19 Vaccine 01/26/2020 12:10 PM 0.3 mL 12/01/2019 Intramuscular   Manufacturer: [place]   Lot: [id number]   [place]: [id number]

## 2020-02-20 ENCOUNTER — Ambulatory Visit: Payer: Medicare PPO | Attending: Internal Medicine

## 2020-02-20 DIAGNOSIS — Z23 Encounter for immunization: Secondary | ICD-10-CM | POA: Insufficient documentation

## 2020-02-20 NOTE — Progress Notes (Signed)
   Covid-19 Vaccination Clinic  Name:  TRICIA USERY    MRN: [id number] DOB: 05-18-1934  02/20/2020  Ms. Rohloff was observed post Covid-19 immunization for 15 minutes without incident. She was provided with Vaccine Information Sheet and instruction to access the V-Safe system.   Ms. Llaguno was instructed to call 911 with any severe reactions post vaccine: [name] Kitchen Difficulty breathing  . Swelling of face and throat  . A fast heartbeat  . A bad rash all over body  . Dizziness and weakness   Immunizations Administered    Name Date Dose VIS Date Route   Pfizer COVID-19 Vaccine 02/20/2020 12:08 PM 0.3 mL 12/01/2019 Intramuscular   Manufacturer: [place]   Lot: [id number]   [place]: [id number]

## 2020-03-20 ENCOUNTER — Other Ambulatory Visit: Payer: Self-pay | Admitting: Family Medicine

## 2020-03-20 ENCOUNTER — Ambulatory Visit: Payer: Self-pay | Admitting: Cardiovascular Disease

## 2020-03-20 ENCOUNTER — Telehealth: Payer: Self-pay

## 2020-03-20 NOTE — Telephone Encounter (Signed)
Please call pt to schedule appt for nonfasting labs.  No further refills until pt is seen.  Charyl Bigger, CMA

## 2020-03-28 NOTE — Progress Notes (Signed)
Patient ID: Kim Lane, female   DOB: [birth year], 86 y.o.   MRN: [id number]   86 y.o. f/u for CAD had high risk myovue 06/10/17 for preoperative back surgery EF 58% Ischemia in the apical and anterior regions F/u cath done by Dr Burt Knack reviewed   Conclusion   1. Severe three-vessel coronary artery disease with:  Severe mid LAD and first diagonal stenosis with continued patency of the stented segment in the mid LAD  Severe ostial left circumflex and first OM stenoses  Severe proximal RCA and posterior AV segment stenoses  2. Normal LV function by nuclear assessment     She was allowed to have her back surgery with Dr Rolena Infante and had no cardiac complications and good pain relief  Follows with Oncology for Hodgkin's Lymphoma stable at this point   Daughter has throat cancer only age 50 has had surgery/chemo/XRT feeding tube out and gaining weight  This has been hard on her   ROS: Denies fever, malais, weight loss, blurry vision, decreased visual acuity, cough, sputum, SOB, hemoptysis, pleuritic pain, palpitaitons, heartburn, abdominal pain, melena, lower extremity edema, claudication, or rash.  All other systems reviewed and negative  General: BP 130/86   Pulse 63   Ht 5\' 1"  (1.549 m)   Wt 136 lb (61.7 kg)   SpO2 97%   BMI 25.70 kg/m  Affect appropriate Elderly white female  HEENT: right neck lymph nodes  Neck supple with no adenopathy JVP normal no bruits no thyromegaly Lungs clear with no wheezing and good diaphragmatic motion Heart:  S1/S2 SEM  murmur, no rub, gallop or click PMI normal Abdomen: benighn, BS positve, no tenderness, no AAA post appy  no bruit.  No HSM or HJR Distal pulses intact with no bruits LE telangiectasia  Neuro non-focal Skin warm and dry Post bilateral TKR  Current Outpatient Medications  Medication Sig Dispense Refill  . aspirin 81 MG tablet Take 81 mg by mouth every other day.     . carvedilol (COREG) 3.125 MG tablet TAKE 1 TABLET BY  MOUTH EVERY OTHER MORNING 45 tablet 1  . Cholecalciferol (HM VITAMIN D3) 100 MCG (4000 UT) CAPS 4,000 IU QD 30 capsule   . Coenzyme Q10 100 MG capsule Take 100 mg by mouth daily.     . diclofenac Sodium (VOLTAREN) 1 % GEL Apply 2 g topically 4 (four) times daily. To affected joint for pain prn 100 g 0  . levothyroxine (SYNTHROID) 88 MCG tablet TAKE 1 TABLET BY MOUTH DAILY BEFORE BREAKFAST 30 tablet 0  . Melatonin 2.5 MG CAPS Take 1 capsule by mouth at bedtime.    . Multiple Vitamins-Minerals (CENTRUM SILVER PO) Take 1 tablet by mouth daily.      . nitroGLYCERIN (NITROLINGUAL) 0.4 MG/SPRAY spray Place 1 spray under the tongue every 5 (five) minutes x 3 doses as needed for chest pain (3 sprays max).    . Omega-3 Fatty Acids (FISH OIL) 1000 MG CAPS Take 1 capsule by mouth daily.     [name] Kitchen PROAIR HFA 108 (90 Base) MCG/ACT inhaler Inhale 2 puffs into the lungs every 4 (four) hours as needed for shortness of breath.     . RESTASIS 0.05 % ophthalmic emulsion Place 2 drops into both eyes 2 (two) times daily. For dry eyes    . rosuvastatin (CRESTOR) 5 MG tablet Take 1 tablet (5 mg total) by mouth at bedtime. 90 tablet 0  . triamcinolone cream (KENALOG) 0.1 % Apply 1 application topically 2 (two)  times daily. 60 g 0   No current facility-administered medications for this visit.    Allergies  Fosamax [alendronate sodium]  Electrocardiogram:  04/03/20 SR rate 63 normal   Assessment and Plan  CAD: Severe 3VD no angina Continue medical Rx and consider intervention only for unstable agnina give age and co morbidities  Continue beta blocker and ASA   Thyroid  Continue replacement TSH normal labs with primary   TIA:  Non focal neuro exam for me.  Echo with bubble study negative  ECG totally normal Monitor unrevealing and patient defers LINQ impant at this time  Lymphoma:  Has had chemo and resultant anemia needing transfusion PET scan 2018 reassuring f/u oncology Dr [name]   Back:  Post kyphoplasty with  good pain relief. Compression fractures ? Due to osteopenia did not tolerate fosamax in past f/u primary   Claudication:  Leg pain not vascular claudication normal ABI's 03/21/19       [name]

## 2020-04-01 ENCOUNTER — Other Ambulatory Visit: Payer: Self-pay

## 2020-04-01 ENCOUNTER — Other Ambulatory Visit: Payer: Medicare PPO

## 2020-04-01 DIAGNOSIS — Z5181 Encounter for therapeutic drug level monitoring: Secondary | ICD-10-CM

## 2020-04-01 DIAGNOSIS — E7800 Pure hypercholesterolemia, unspecified: Secondary | ICD-10-CM | POA: Diagnosis not present

## 2020-04-01 DIAGNOSIS — M8000XG Age-related osteoporosis with current pathological fracture, unspecified site, subsequent encounter for fracture with delayed healing: Secondary | ICD-10-CM

## 2020-04-01 DIAGNOSIS — I2510 Atherosclerotic heart disease of native coronary artery without angina pectoris: Secondary | ICD-10-CM

## 2020-04-01 DIAGNOSIS — Z9861 Coronary angioplasty status: Secondary | ICD-10-CM | POA: Diagnosis not present

## 2020-04-01 DIAGNOSIS — E039 Hypothyroidism, unspecified: Secondary | ICD-10-CM

## 2020-04-01 DIAGNOSIS — Z8673 Personal history of transient ischemic attack (TIA), and cerebral infarction without residual deficits: Secondary | ICD-10-CM | POA: Diagnosis not present

## 2020-04-01 DIAGNOSIS — C8190 Hodgkin lymphoma, unspecified, unspecified site: Secondary | ICD-10-CM

## 2020-04-02 LAB — LIPID PANEL
Chol/HDL Ratio: 3.3 ratio (ref 0.0–4.4)
Cholesterol, Total: 193 mg/dL (ref 100–199)
HDL: 59 mg/dL (ref >39)
LDL Chol Calc (NIH): 116 mg/dL — ABNORMAL HIGH (ref 0–99)
Triglycerides: 102 mg/dL (ref 0–149)
VLDL Cholesterol Cal: 18 mg/dL (ref 5–40)

## 2020-04-02 LAB — TSH: TSH: 4.98 u[IU]/mL — ABNORMAL HIGH (ref 0.450–4.500)

## 2020-04-02 LAB — ALT: ALT: 24 IU/L (ref 0–32)

## 2020-04-02 NOTE — Addendum Note (Signed)
Addended by: Marjory Sneddon on: 04/02/2020 12:18 PM   Modules accepted: Orders

## 2020-04-03 ENCOUNTER — Other Ambulatory Visit: Payer: Self-pay

## 2020-04-03 ENCOUNTER — Ambulatory Visit: Payer: Medicare PPO | Admitting: Cardiovascular Disease

## 2020-04-03 ENCOUNTER — Encounter: Payer: Self-pay | Admitting: Cardiovascular Disease

## 2020-04-03 VITALS — BP 130/86 | HR 63 | Ht 61.0 in | Wt 136.0 lb

## 2020-04-03 DIAGNOSIS — I2510 Atherosclerotic heart disease of native coronary artery without angina pectoris: Secondary | ICD-10-CM

## 2020-04-03 DIAGNOSIS — Z8673 Personal history of transient ischemic attack (TIA), and cerebral infarction without residual deficits: Secondary | ICD-10-CM

## 2020-04-03 MED ORDER — ROSUVASTATIN CALCIUM 10 MG PO TABS: 10.0000 mg | ORAL_TABLET | Freq: Every day | ORAL | 3 refills | Status: DC

## 2020-04-03 NOTE — Patient Instructions (Addendum)

## 2020-04-03 NOTE — Telephone Encounter (Signed)
Patient aware of Dr. Kyla Balzarine recommendations. Patient will increase her Crestor to 10 mg by mouth daily and get lab work repeated on 07/03/20.

## 2020-04-03 NOTE — Telephone Encounter (Signed)
-----   Message from Josue Hector, MD sent at 04/03/2020 10:28 AM EDT ----- LDL above 70 increase crestor to 10 mg daily and f/u labs in 3 months

## 2020-04-13 ENCOUNTER — Other Ambulatory Visit: Payer: Self-pay | Admitting: Family Medicine

## 2020-04-15 ENCOUNTER — Telehealth: Payer: Self-pay | Admitting: Family Medicine

## 2020-04-15 ENCOUNTER — Other Ambulatory Visit: Payer: Self-pay | Admitting: Family Medicine

## 2020-04-15 MED ORDER — LEVOTHYROXINE SODIUM 88 MCG PO TABS: ORAL_TABLET | ORAL | 0 refills | Status: DC

## 2020-04-15 NOTE — Telephone Encounter (Signed)
Patient was required to come in for labs prior to refills. Patient was seen a few weeks ago and had labs drawn. She is almost out of her thyroid med and is requesting a refill to be sent to [facility].

## 2020-04-15 NOTE — Telephone Encounter (Signed)
Patient was last seen 11/2019 and advised to follow up in 2-4 months and to have labs drawn in 2 months. Labs have been drawn but patient has not followed up per Dr. Raliegh Scarlet note.   Sending in #15 day supply per refill protocol.   Please contact patient to schedule OV. AS, CMA

## 2020-04-17 ENCOUNTER — Ambulatory Visit: Payer: Medicare PPO | Admitting: Physician Assistant

## 2020-04-17 ENCOUNTER — Encounter: Payer: Self-pay | Admitting: Physician Assistant

## 2020-04-17 ENCOUNTER — Other Ambulatory Visit: Payer: Self-pay

## 2020-04-17 VITALS — BP 125/79 | HR 64 | Temp 98.0°F | Ht 61.0 in | Wt 141.5 lb

## 2020-04-17 DIAGNOSIS — M159 Polyosteoarthritis, unspecified: Secondary | ICD-10-CM | POA: Diagnosis not present

## 2020-04-17 DIAGNOSIS — R7302 Impaired glucose tolerance (oral): Secondary | ICD-10-CM

## 2020-04-17 DIAGNOSIS — E039 Hypothyroidism, unspecified: Secondary | ICD-10-CM | POA: Diagnosis not present

## 2020-04-17 DIAGNOSIS — I10 Essential (primary) hypertension: Secondary | ICD-10-CM

## 2020-04-17 DIAGNOSIS — E7800 Pure hypercholesterolemia, unspecified: Secondary | ICD-10-CM | POA: Diagnosis not present

## 2020-04-17 MED ORDER — LEVOTHYROXINE SODIUM 88 MCG PO TABS: ORAL_TABLET | ORAL | 0 refills | Status: DC

## 2020-04-17 NOTE — Progress Notes (Signed)
Established Patient Office Visit  Subjective:  Patient ID: Kim Lane, female    DOB: May 25, 1934  Age: 86 y.o. MRN: [id number]  CC:  Chief Complaint  Patient presents with  . Hypothyroidism    HPI PRIYANKA CAUSEY presents for follow-up on hypothyroid. Pt reports she needs refills on her levothyroxine. Pt denies heat/cold intolerance, weight changes, bowel changes from baseline, or palpitations.   HTN: Pt denies chest pain, dizziness or leg swelling. Taking medications as directed without any side effects. She does check her BP at home few times per wk.  HLD: Pt reports good compliance with medication and treatment plan. Denies side effects- myalgias, RUQ pain.   OA: Pt states she continues to have aches and pains and hasn't used meloxicam as Dr. Raliegh Scarlet recommended. She's been taking Tylenol and using voltaren gel which provide some relief.   Past Medical History:  Diagnosis Date  . Anemia   . Anginal pain ([place])    occ  . Arthritis   . Blood dyscrasia 12/2016   hodgins lymphoma tx-remission  . CAD (coronary artery disease) 02/2006   Taxus stent placed to LAD and Diagonal per Dr. Olevia Perches  . Cataracts, bilateral   . Complication of anesthesia   . HTN (hypertension)   . Hypercholesterolemia   . Hypothyroidism   . IBS (irritable bowel syndrome)   . Osteoarthritis   . PONV (postoperative nausea and vomiting)   . Stroke [facility]) 03/2016    Past Surgical History:  Procedure Laterality Date  . APPENDECTOMY    . IR GENERIC HISTORICAL  01/25/2017   IR US GUIDE VASC ACCESS RIGHT 01/25/2017 [name], MD [facility]  . IR GENERIC HISTORICAL  01/25/2017   IR FLUORO GUIDE PORT INSERTION RIGHT 01/25/2017 [name], MD [facility]  . IR REMOVAL TUN ACCESS W/ PORT W/O FL MOD SED  10/28/2017  . KYPHOPLASTY N/A 06/24/2017   Procedure: KYPHOPLASTY T10;  Surgeon: Melina Schools, MD;  Location: [place];  Service: Orthopedics;  Laterality: N/A;  90 mins  . LEFT HEART CATH AND CORONARY  ANGIOGRAPHY N/A 06/16/2017   Procedure: Left Heart Cath and Coronary Angiography;  Surgeon: Sherren Mocha, MD;  Location: [place] [facility];  Service: Cardiovascular;  Laterality: N/A;  . MASS EXCISION Right 12/22/2016   Procedure: RIGHT NECK LYMPH NODE BIOPSY;  Surgeon: Rozetta Nunnery, MD;  Location: [place];  Service: ENT;  Laterality: Right;  . SPINE SURGERY    . stents     in heart  . TONSILLECTOMY    . TOTAL KNEE ARTHROPLASTY Bilateral 2011,2012  . VAGINAL HYSTERECTOMY      Family History  Problem Relation Age of Onset  . Diabetes Mother   . Dementia Mother   . ALS Mother   . Heart Problems Father   . Liver disease Sister   . Cirrhosis Sister   . Heart block Other        CABG  . Diabetes Sister   . Heart block Son        CABG  . Other [place]  . Other Daughter        [place]  . Dementia Sister   . Breast cancer Neg Hx     Social History   Socioeconomic History  . Marital status: Widowed    Spouse name: Not on file  . Number of children: 2  . Years of education: 6  . 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 Smoking status: Former Research scientist (life sciences)  . Smokeless tobacco: Never Used  . Tobacco comment: quit smoking 30 years ago, very light smoker  Substance and Sexual Activity  . Alcohol use: No    Alcohol/week: 0.0 standard drinks  . Drug use: No  . Sexual activity: Not on file  Other Topics Concern  . Not on file  Social History Narrative   08/29/19 Lives alone   caffeine use- coffee- 5 cups daily   Social Determinants of Health   Financial Resource Strain:   . Difficulty of Paying Living Expenses:   Food Insecurity:   . Worried About Charity fundraiser in the Last Year:   . Arboriculturist in the Last Year:   Transportation Needs:   . Film/video editor (Medical):   [name] Kitchen Lack of Transportation (Non-Medical):    Physical Activity:   . Days of Exercise per Week:   . Minutes of Exercise per Session:   Stress:   . Feeling of Stress :   Social Connections:   . Frequency of Communication with Friends and Family:   . Frequency of Social Gatherings with Friends and Family:   . Attends Religious Services:   . Active Member of Clubs or Organizations:   . Attends Archivist Meetings:   [name] Kitchen Marital Status:   Intimate Partner Violence:   . Fear of Current or Ex-Partner:   . Emotionally Abused:   [name] Kitchen Physically Abused:   . Sexually Abused:     Outpatient Medications Prior to Visit  Medication Sig Dispense Refill  . aspirin 81 MG tablet Take 81 mg by mouth every other day.     . carvedilol (COREG) 3.125 MG tablet TAKE 1 TABLET BY MOUTH EVERY OTHER MORNING 45 tablet 1  . Cholecalciferol (HM VITAMIN D3) 100 MCG (4000 UT) CAPS 4,000 IU QD 30 capsule   . Coenzyme Q10 100 MG capsule Take 100 mg by mouth daily.     . diclofenac Sodium (VOLTAREN) 1 % GEL Apply 2 g topically 4 (four) times daily. To affected joint for pain prn 100 g 0  . Melatonin 2.5 MG CAPS Take 1 capsule by mouth at bedtime.    . Multiple Vitamins-Minerals (CENTRUM SILVER PO) Take 1 tablet by mouth daily.      . nitroGLYCERIN (NITROLINGUAL) 0.4 MG/SPRAY spray Place 1 spray under the tongue every 5 (five) minutes x 3 doses as needed for chest pain (3 sprays max).    . Omega-3 Fatty Acids (FISH OIL) 1000 MG CAPS Take 1 capsule by mouth daily.     [name] Kitchen PROAIR HFA 108 (90 Base) MCG/ACT inhaler Inhale 2 puffs into the lungs every 4 (four) hours as needed for shortness of breath.     . RESTASIS 0.05 % ophthalmic emulsion Place 2 drops into both eyes 2 (two) times daily. For dry eyes    . rosuvastatin (CRESTOR) 10 MG tablet Take 1 tablet (10 mg total) by mouth at bedtime. 90 tablet 3  . triamcinolone cream (KENALOG) 0.1 % Apply 1 application topically 2 (two) times daily. 60 g 0  . levothyroxine (SYNTHROID) 88 MCG tablet TAKE 1 TABLET BY MOUTH  DAILY BEFORE BREAKFAST 30 tablet 0   No facility-administered medications prior to visit.    Allergies  Allergen Reactions  . Fosamax [Alendronate Sodium] Other (See Comments)    "  made me ache all over"    ROS Review of Systems  Review of Systems: General: Denies fever, chills, unexplained weight loss.  Optho/Auditory: Denies visual changes, blurred vision/LOV Respiratory: Denies SOB, DOE more than baseline levels.  Cardiovascular: Denies chest pain, palpitations, new onset peripheral edema  Gastrointestinal: Denies nausea, vomiting, diarrhea.  Genitourinary: Denies dysuria, freq/ urgency, flank pain or discharge from genitals.  Endocrine:  Denies hot or cold intolerance, polyuria, polydipsia. Musculoskeletal: Denies unexplained myalgias, gait problems Skin: Denies rash, suspicious lesions Neurological:  Denies dizziness, unexplained weakness, numbness  Psychiatric/Behavioral: Denies mood changes, suicidal or homicidal ideations, hallucinations   Objective:    Physical Exam  General:  Well Developed, well nourished, appropriate for stated age, in no acute distress Neuro:  Alert and oriented HEENT:  Normocephalic, atraumatic, neck supple Skin:  no gross rash, warm and dry Cardiac:  RRR Respiratory:  ECTA B/L and A/P, Not using accessory muscles, speaking in full sentences- unlabored. Vascular:  Ext warm, no cyanosis apprec.; cap RF less 2 sec. Psych:  No HI/SI, judgement and insight good, Euthymic mood. Full Affect.  BP 125/79   Pulse 64   Temp 98 F (36.7 C) (Oral)   Ht _0  (1.549 m)   Wt 141 lb 8 oz (64.2 kg)   SpO2 99% Comment: on RA  BMI 26.74 kg/m  Wt Readings from Last 3 Encounters:  04/17/20 141 lb 8 oz (64.2 kg)  04/03/20 136 lb (61.7 kg)  12/12/19 130 lb (59 kg)     There are no preventive care reminders to display for this patient.  There are no preventive care reminders to display for this patient.  Lab Results  Component Value Date   TSH  4.980 (H) 04/01/2020   Lab Results  Component Value Date   WBC 8.5 01/05/2020   HGB 12.7 01/05/2020   HCT 39.4 01/05/2020   MCV 91.8 01/05/2020   PLT 323 01/05/2020   Lab Results  Component Value Date   NA 141 01/05/2020   K 4.2 01/05/2020   CHLORIDE 105 10/07/2017   CO2 29 01/05/2020   GLUCOSE 101 (H) 01/05/2020   BUN 12 01/05/2020   CREATININE 1.00 01/05/2020   BILITOT 0.5 01/05/2020   ALKPHOS 53 01/05/2020   AST 23 01/05/2020   ALT 24 04/01/2020   PROT 6.7 01/05/2020   ALBUMIN 3.9 01/05/2020   CALCIUM 8.7 (L) 01/05/2020   ANIONGAP 8 01/05/2020   EGFR >60 10/07/2017   GFR 66.34 09/14/2012   Lab Results  Component Value Date   CHOL 193 04/01/2020   Lab Results  Component Value Date   HDL 59 04/01/2020   Lab Results  Component Value Date   LDLCALC 116 (H) 04/01/2020   Lab Results  Component Value Date   TRIG 102 04/01/2020   Lab Results  Component Value Date   CHOLHDL 3.3 04/01/2020   Lab Results  Component Value Date   HGBA1C 6.0 (H) 11/13/2019      Assessment & Plan:   Problem List Items Addressed This Visit      Cardiovascular and Mediastinum   Essential hypertension, benign (Chronic)     Endocrine   Hypothyroidism - Primary (Chronic)   Relevant Medications   levothyroxine (SYNTHROID) 88 MCG tablet   Glucose intolerance (impaired glucose tolerance)     Other   HYPERCHOLESTEROLEMIA, MIXED (Chronic)      Meds ordered this encounter  Medications  . levothyroxine (SYNTHROID) 88 MCG tablet    Sig: TAKE 1 TABLET  BY MOUTH DAILY BEFORE BREAKFAST    Dispense:  90 tablet    Refill:  0   Hypothyroidism: - Pt is asymptomatic and most recent TSH significantly improved from 8.230 to 4.980 (slightly above normal), so will continue current dose of levothyroxine 88 MCG.  - Plan to recheck TSH in 3 months and will make any dose adjustments if needed then.  Hypertension: - BP today is 125/79, stable. - Continue ambulatory blood pressure  monitoring. - Continue current med regimen.    HLD: - Pt followed by cardiology and most recent lipid panel LDL still elevated, so Dr. Johnsie Cancel increased Crestor to 10 mg. - Pt tolerating new dose without side effects. - Continue current med regimen. - Follow heart healthy diet.   Pre-diabetes: - A1c 6.0, stable.  - Encourage to follow low carb/glucose diet and stay as active as possible to help control disease progression.  - Will continue to monitor.  Generalized OA: - Dr. Raliegh Scarlet recommended patient to discontinue meloxicam at last OV and patient has been using volatren gel with some relief. Pt declines referral to PT or Orthopedics at the time. She wants to continue conservative therapy.    Follow-up: Return for repeat TSH 3 months/4-6 mon [facility].    Lorrene Reid, PA-C

## 2020-05-06 ENCOUNTER — Inpatient Hospital Stay: Payer: Medicare PPO | Attending: Hematology

## 2020-05-06 ENCOUNTER — Other Ambulatory Visit: Payer: Self-pay

## 2020-05-06 ENCOUNTER — Inpatient Hospital Stay: Payer: Medicare PPO | Admitting: Hematology

## 2020-05-06 VITALS — BP 143/68 | HR 68 | Temp 98.7°F | Resp 18 | Ht 61.0 in | Wt 137.9 lb

## 2020-05-06 DIAGNOSIS — M8000XG Age-related osteoporosis with current pathological fracture, unspecified site, subsequent encounter for fracture with delayed healing: Secondary | ICD-10-CM

## 2020-05-06 DIAGNOSIS — C8190 Hodgkin lymphoma, unspecified, unspecified site: Secondary | ICD-10-CM

## 2020-05-06 DIAGNOSIS — I10 Essential (primary) hypertension: Secondary | ICD-10-CM

## 2020-05-06 LAB — CMP (CANCER CENTER ONLY)
ALT: 25 U/L (ref 0–44)
AST: 27 U/L (ref 15–41)
Albumin: 4 g/dL (ref 3.5–5.0)
Alkaline Phosphatase: 52 U/L (ref 38–126)
Anion gap: 8 (ref 5–15)
BUN: 15 mg/dL (ref 8–23)
CO2: 28 mmol/L (ref 22–32)
Calcium: 9.2 mg/dL (ref 8.9–10.3)
Chloride: 105 mmol/L (ref 98–111)
Creatinine: 1.05 mg/dL — ABNORMAL HIGH (ref 0.44–1.00)
GFR, Est AFR Am: 57 mL/min — ABNORMAL LOW (ref >60)
GFR, Estimated: 49 mL/min — ABNORMAL LOW (ref >60)
Glucose, Bld: 115 mg/dL — ABNORMAL HIGH (ref 70–99)
Potassium: 5.1 mmol/L (ref 3.5–5.1)
Sodium: 141 mmol/L (ref 135–145)
Total Bilirubin: 0.5 mg/dL (ref 0.3–1.2)
Total Protein: 7.1 g/dL (ref 6.5–8.1)

## 2020-05-06 LAB — CBC WITH DIFFERENTIAL/PLATELET
Abs Immature Granulocytes: 0.04 10*3/uL (ref 0.00–0.07)
Basophils Absolute: 0.1 10*3/uL (ref 0.0–0.1)
Basophils Relative: 2 %
Eosinophils Absolute: 0.9 10*3/uL — ABNORMAL HIGH (ref 0.0–0.5)
Eosinophils Relative: 11 %
HCT: 41.6 % (ref 36.0–46.0)
Hemoglobin: 13.2 g/dL (ref 12.0–15.0)
Immature Granulocytes: 1 %
Lymphocytes Relative: 16 %
Lymphs Abs: 1.3 10*3/uL (ref 0.7–4.0)
MCH: 29.3 pg (ref 26.0–34.0)
MCHC: 31.7 g/dL (ref 30.0–36.0)
MCV: 92.4 fL (ref 80.0–100.0)
Monocytes Absolute: 0.8 10*3/uL (ref 0.1–1.0)
Monocytes Relative: 9 %
Neutro Abs: 5 10*3/uL (ref 1.7–7.7)
Neutrophils Relative %: 61 %
Platelets: 334 10*3/uL (ref 150–400)
RBC: 4.5 MIL/uL (ref 3.87–5.11)
RDW: 14.4 % (ref 11.5–15.5)
WBC: 8.2 10*3/uL (ref 4.0–10.5)
nRBC: 0 % (ref 0.0–0.2)

## 2020-05-06 MED ORDER — CARVEDILOL 3.125 MG PO TABS: ORAL_TABLET | ORAL | 0 refills | Status: DC

## 2020-05-06 NOTE — Progress Notes (Signed)
HEMATOLOGY/ONCOLOGY CLINIC NOTE  Date of Service: 05/06/20  Patient Care Team: Lorrene Reid, PA-C as PCP - General Josue Hector, MD as PCP - Cardiology (Cardiology) Josue Hector, MD as Consulting Physician (Cardiology) Penni Bombard, MD as Consulting Physician (Neurology) Jarome Matin, MD as Consulting Physician (Dermatology) Rozetta Nunnery, MD as Consulting Physician (Otolaryngology) Brunetta Genera, MD as Consulting Physician (Hematology) Melina Schools, MD as Consulting Physician (Orthopedic Surgery) Renette Butters, MD as Attending Physician (Orthopedic Surgery)  CHIEF COMPLAINTS/PURPOSE OF CONSULTATION:   f/u for continued treatment of Hodgkin's lymphoma  HISTORY OF PRESENTING ILLNESS:   plz see previous   INTERVAL HISTORY   Kim Lane is here for scheduled follow up for her Hodgkins lymphoma. The patient's last visit with [place] was on 11/05/2020. The pt reports that she is doing well overall.  The pt reports that she has some pain in her hip due to arthritis. Pt has been eating well, but has had some difficulty sleeping at night. She naps during the day occasionally. Pt takes OTC sleep aids to help her sleep sometimes. She has had both doses of the COVID19 vaccine. Pt had some short-lived pains after the second dose. Pt has noticed some increased bruising along her arms.   Lab results today (05/06/20) of CBC w/diff and CMP is as follows: all values are WNL except for Eos Abs at 0.9K, Glucose at 115, Creatinine at 1.05, GFR Est Non Af Am at 49.  On review of systems, pt reports sleeplessness, hip pain, bruising and denies rash, low appetite, unexpected weight loss, new lumps/bumps, abdominal pain and any other symptoms.    MEDICAL HISTORY:  Past Medical History:  Diagnosis Date  . Anemia   . Anginal pain ([place])    occ  . Arthritis   . Blood dyscrasia 12/2016   hodgins lymphoma tx-remission  . CAD (coronary artery disease) 02/2006    Taxus stent placed to LAD and Diagonal per Dr. Olevia Perches  . Cataracts, bilateral   . Complication of anesthesia   . HTN (hypertension)   . Hypercholesterolemia   . Hypothyroidism   . IBS (irritable bowel syndrome)   . Osteoarthritis   . PONV (postoperative nausea and vomiting)   . Stroke [facility]) 03/2016  Osteoporosis Cardiologist Dr. [name] -Dr. Earlean Shawl  SURGICAL HISTORY: Past Surgical History:  Procedure Laterality Date  . APPENDECTOMY    . IR GENERIC HISTORICAL  01/25/2017   IR US GUIDE VASC ACCESS RIGHT 01/25/2017 [name], MD [facility]  . IR GENERIC HISTORICAL  01/25/2017   IR FLUORO GUIDE PORT INSERTION RIGHT 01/25/2017 [name], MD [facility]  . IR REMOVAL TUN ACCESS W/ PORT W/O FL MOD SED  10/28/2017  . KYPHOPLASTY N/A 06/24/2017   Procedure: KYPHOPLASTY T10;  Surgeon: Melina Schools, MD;  Location: [place];  Service: Orthopedics;  Laterality: N/A;  90 mins  . LEFT HEART CATH AND CORONARY ANGIOGRAPHY N/A 06/16/2017   Procedure: Left Heart Cath and Coronary Angiography;  Surgeon: Sherren Mocha, MD;  Location: [place] CV LAB;  Service: Cardiovascular;  Laterality: N/A;  . MASS EXCISION Right 12/22/2016   Procedure: RIGHT NECK LYMPH NODE BIOPSY;  Surgeon: Rozetta Nunnery, MD;  Location: [place];  Service: ENT;  Laterality: Right;  . SPINE SURGERY    . stents     in heart  . TONSILLECTOMY    . TOTAL KNEE ARTHROPLASTY Bilateral 2011,2012  . VAGINAL HYSTERECTOMY  SOCIAL HISTORY: Social History   Socioeconomic History  . Marital status: Widowed    Spouse name: Not on file  . Number of children: 2  . Years of education: 19  . 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 Smoking status: Former Research scientist (life sciences)  . Smokeless tobacco: Never Used  . Tobacco comment: quit smoking 30 years ago, very light smoker  Substance and Sexual Activity  .  Alcohol use: No    Alcohol/week: 0.0 standard drinks  . Drug use: No  . Sexual activity: Not on file  Other Topics Concern  . Not on file  Social History Narrative   08/29/19 Lives alone   caffeine use- coffee- 5 cups daily   Social Determinants of Health   Financial Resource Strain:   . Difficulty of Paying Living Expenses:   Food Insecurity:   . Worried About Charity fundraiser in the Last Year:   . Arboriculturist in the Last Year:   Transportation Needs:   . Film/video editor (Medical):   [name] Kitchen Lack of Transportation (Non-Medical):   Physical Activity:   . Days of Exercise per Week:   . Minutes of Exercise per Session:   Stress:   . Feeling of Stress :   Social Connections:   . Frequency of Communication with Friends and Family:   . Frequency of Social Gatherings with Friends and Family:   . Attends Religious Services:   . Active Member of Clubs or Organizations:   . Attends Archivist Meetings:   [name] Kitchen Marital Status:   Intimate Partner Violence:   . Fear of Current or Ex-Partner:   . Emotionally Abused:   [name] Kitchen Physically Abused:   . Sexually Abused:     FAMILY HISTORY: Family History  Problem Relation Age of Onset  . Diabetes Mother   . Dementia Mother   . ALS Mother   . Heart Problems Father   . Liver disease Sister   . Cirrhosis Sister   . Heart block Other        CABG  . Diabetes Sister   . Heart block Son        CABG  . Other [place]  . Other Daughter        [place]  . Dementia Sister   . Breast cancer Neg Hx     ALLERGIES:  is allergic to fosamax [alendronate sodium].  MEDICATIONS:  Current Outpatient Medications  Medication Sig Dispense Refill  . aspirin 81 MG tablet Take 81 mg by mouth every other day.     . carvedilol (COREG) 3.125 MG tablet Take one tablet by mouth every other morning 45 tablet 0  . Cholecalciferol (HM VITAMIN D3) 100 MCG (4000 UT) CAPS 4,000 IU QD 30 capsule   . Coenzyme Q10 100 MG capsule  Take 100 mg by mouth daily.     . diclofenac Sodium (VOLTAREN) 1 % GEL Apply 2 g topically 4 (four) times daily. To affected joint for pain prn 100 g 0  . levothyroxine (SYNTHROID) 88 MCG tablet TAKE 1 TABLET BY MOUTH DAILY BEFORE BREAKFAST 90 tablet 0  . Melatonin 2.5 MG CAPS Take 1 capsule by mouth at bedtime.    . Multiple Vitamins-Minerals (CENTRUM SILVER PO) Take 1 tablet by mouth daily.      . nitroGLYCERIN (  NITROLINGUAL) 0.4 MG/SPRAY spray Place 1 spray under the tongue every 5 (five) minutes x 3 doses as needed for chest pain (3 sprays max).    . Omega-3 Fatty Acids (FISH OIL) 1000 MG CAPS Take 1 capsule by mouth daily.     [name] Kitchen PROAIR HFA 108 (90 Base) MCG/ACT inhaler Inhale 2 puffs into the lungs every 4 (four) hours as needed for shortness of breath.     . RESTASIS 0.05 % ophthalmic emulsion Place 2 drops into both eyes 2 (two) times daily. For dry eyes    . rosuvastatin (CRESTOR) 10 MG tablet Take 1 tablet (10 mg total) by mouth at bedtime. 90 tablet 3  . triamcinolone cream (KENALOG) 0.1 % Apply 1 application topically 2 (two) times daily. 60 g 0   No current facility-administered medications for this visit.    REVIEW OF SYSTEMS:  A 10+ POINT REVIEW OF SYSTEMS WAS OBTAINED including neurology, dermatology, psychiatry, cardiac, respiratory, lymph, extremities, GI, GU, Musculoskeletal, constitutional, breasts, reproductive, HEENT.  All pertinent positives are noted in the HPI.  All others are negative.   PHYSICAL EXAMINATION:  ECOG PERFORMANCE STATUS: 2 - Symptomatic, <50% confined to bed  Vitals:   05/06/20 1039  BP: (!) 143/68  Pulse: 68  Resp: 18  Temp: 98.7 F (37.1 C)  TempSrc: Temporal  SpO2: 100%  Weight: 137 lb 14.4 oz (62.6 kg)  Height: 5\' 1"  (1.549 m)  .  Exam was given in a chair   GENERAL:alert, in no acute distress and comfortable SKIN: no acute rashes, no significant lesions EYES: conjunctiva are pink and non-injected, sclera anicteric OROPHARYNX: MMM, no  exudates, no oropharyngeal erythema or ulceration NECK: supple, no JVD LYMPH:  no palpable lymphadenopathy in the cervical, axillary or inguinal regions LUNGS: clear to auscultation b/l with normal respiratory effort HEART: regular rate & rhythm ABDOMEN:  normoactive bowel sounds , non tender, not distended. No palpable hepatosplenomegaly.  Extremity: no pedal edema PSYCH: alert & oriented x 3 with fluent speech NEURO: no focal motor/sensory deficits  LABORATORY DATA:  I have reviewed the data as listed   . CBC Latest Ref Rng & Units 05/06/2020 01/05/2020 11/13/2019  WBC 4.0 - 10.5 K/uL 8.2 8.5 6.8  Hemoglobin 12.0 - 15.0 g/dL 13.2 12.7 12.6  Hematocrit 36.0 - 46.0 % 41.6 39.4 38.2  Platelets 150 - 400 K/uL 334 323 386   . CMP Latest Ref Rng & Units 05/06/2020 04/01/2020 01/05/2020  Glucose 70 - 99 mg/dL 115(H) - 101(H)  BUN 8 - 23 mg/dL 15 - 12  Creatinine 0.44 - 1.00 mg/dL 1.05(H) - 1.00  Sodium 135 - 145 mmol/L 141 - 141  Potassium 3.5 - 5.1 mmol/L 5.1 - 4.2  Chloride 98 - 111 mmol/L 105 - 104  CO2 22 - 32 mmol/L 28 - 29  Calcium 8.9 - 10.3 mg/dL 9.2 - 8.7(L)  Total Protein 6.5 - 8.1 g/dL 7.1 - 6.7  Total Bilirubin 0.3 - 1.2 mg/dL 0.5 - 0.5  Alkaline Phos 38 - 126 U/L 52 - 53  AST 15 - 41 U/L 27 - 23  ALT 0 - 44 U/L 25 24 19    Erythrocyte Sedimentation Rate     Component Value Date/Time   ESRSEDRATE 10 01/05/2020 0907   ESRSEDRATE 20 10/07/2017 1115    RADIOGRAPHIC STUDIES: I have personally reviewed the radiological images as listed and agreed with the findings in the report. No results found.   ASSESSMENT & PLAN:   86 y.o. Caucasian female  with ECOG performance status of 2 with  1) Classical Hodgkin's lymphoma of nodular sclerosis variety At least Stage IIIB some concern for possible Rt lung involvement which makes it Stage IVB Presented with right neck lymph nodes . She had type B constitutional symptoms with 15-20 pound weight loss night sweats or chills .noted  to have significant pruritus with mild rash likely from Hodgkin's which remain resolved  Wt Readings from Last 3 Encounters:  05/06/20 137 lb 14.4 oz (62.6 kg)  04/17/20 141 lb 8 oz (64.2 kg)  04/03/20 136 lb (61.7 kg)  PET/CT scan after 5 cycles shows marked response to chemotherapy and stable predominantly [place] 2 as per discussion with the radiology.  S/p 5 cycles of AVD with last Cycle of AVD on 05/19/17  2) Microcytic anemia - likely anemia of chronic disease due to Hodgkin's lymphoma. anemia resolved, Hg stable   PLAN:  -Discussed pt labwork today, 05/06/20; WBC, PLT, & Hgb are nml, blood chemistries are steady -Pt shows no lab or clinical evidence of Hodgkin's lymphoma recurrence at this time -No indication for further treatment at this time. No residual toxicities noted.  -Recommend pt keep skin well moisturized  -Recommend pt wear compression sleeves to minimize bruising -Advised pt that Benadryl can cause confusion  -Recommend pt use OTC Melatonin and meditation for sleeplessness -Continue Cholecalciferol 4,000 UT daily -Continue Prolia q42months  -Will check Vitamin D levels next visit -Will see back in 7-8 months with labs   3) T10 compression fracture based on imaging done on 05/22/2017 in ED Now status post kyphoplasty with significant improvement in her back pain  4) Osteoporosis PET/CT scan also shows possible L4 compression fracture . These compression fractures appear to be related to osteoporosis . Also had rib fracture related to no significant trauma She was previously on Fosamax that cause generalized body aches .  PLAN:  -I explained she should notify us if she is thinking of doing invasive dental procedures while on Prolia.  -Continue ergocalciferol 50k twice weekly to target a 25OH vit D level of close to 50-60 - levels adequate can reduce dose to once weekly -Continue with Prolia injection every 6 months  5) h/o triple-vessel coronary artery disease  based on cardiac cath: patient is surprisingly asymptomatic. -Continue management as per PCP and cardiology.  6) . Patient Active Problem List   Diagnosis Date Noted  . Stage 3a chronic kidney disease 12/12/2019  . Degenerative disc disease, lumbar- MRI 6/ 2018 08/03/2019  . Neuropathy involving both lower extremities- due to DDD of L-Spine 08/03/2019  . Elevated serum creatinine 08/03/2019  . Prediabetes 06/20/2019  . Hyperlipidemia 06/20/2019  . History of CVA (cerebrovascular accident)- 2017-   05/31/2019  . Syncope and collapse 05/31/2019  . Blistering rash-new onset right palmar surface hand- at base esp btwn fingers 05/31/2019  . Maceration of skin 05/31/2019  . Degeneration of lumbar intervertebral disc 04/11/2019  . Lumbar radiculopathy 04/11/2019  . Pain of both hip joints 03/17/2019  . Localized, primary osteoarthritis 12/07/2018  . Cramps, muscle, general-mostly bilateral lower extremities, worse at night but not just nocturnal 12/07/2018  . Pain in finger of right hand 11/30/2018  . Sleep difficulties 09/11/2018  . Muscle cramps at night 08/10/2018  . Arthritis 08/10/2018  . Dehydration, mild 08/10/2018  . S/P kyphoplasty- dr Rolena Infante- ortho 04/05/2018  . Postmenopausal 04/05/2018  . Closed fracture of multiple ribs 04/05/2018  . Allergic contact dermatitis due to cosmetics 01/26/2018  . Trigger finger, acquired- R  3rd digit 01/26/2018  . Osteoarthritis of finger 01/26/2018  . Vitamin D deficiency 11/24/2017  . Glucose intolerance (impaired glucose tolerance) 11/24/2017  . Osteoarthritis 09/16/2017  . History of iron deficiency anemia-  by ONC 07/23/2017  . Osteoporosis 07/23/2017  . CAD S/P percutaneous coronary angioplasty 07/02/2017  . Closed compression fracture of L2 lumbar vertebra 06/24/2017  . Abnormal nuclear cardiac imaging test 06/16/2017  . Malnutrition of moderate degree 04/27/2017  . Symptomatic anemia 04/26/2017  . Hodgkin lymphoma ([place]) 04/26/2017   . Port catheter in place 02/02/2017  . Nodular sclerosis Hodgkin lymphoma of lymph nodes of neck ([place]) 01/13/2017  . Dyspnea 09/14/2012  . Epistaxis 06/02/2011  . HYPERCHOLESTEROLEMIA, MIXED 01/08/2009  . Essential hypertension, benign 01/08/2009  . Coronary atherosclerosis- sees Dr. Johnsie Cancel 01/08/2009  . Hypothyroidism 01/08/2009  PLAN  -Continue follow-up with primary care physician for management of other chronic medical co-morbidities.   FOLLOW UP: F/u for Next dose of Prolia as scheduled on 7/15 Plz schedule following dose of Prolia with labs and MD visit 5-6 months after 7/15   The total time spent in the appt was 30 minutes and more than 50% was on counseling and direct patient cares.  All of the patient's questions were answered with apparent satisfaction. The patient knows to call the clinic with any problems, questions or concerns.    Sullivan Lone MD [place] AAHIVMS [facility] Hematology/Oncology Physician [facility]  (Office):       [phone] (Work cell):  [phone] (Fax):           [phone]  I, Yevette Edwards, am acting as a scribe for Dr. Sullivan Lone.   .I have reviewed the above documentation for accuracy and completeness, and I agree with the above. Brunetta Genera MD

## 2020-05-07 ENCOUNTER — Telehealth: Payer: Self-pay | Admitting: Hematology

## 2020-05-07 NOTE — Telephone Encounter (Signed)
Scheduled per 05/17 los, patient has been called and notified.  

## 2020-07-03 ENCOUNTER — Other Ambulatory Visit: Payer: Medicare PPO | Admitting: *Deleted

## 2020-07-03 ENCOUNTER — Other Ambulatory Visit: Payer: Self-pay | Admitting: Hematology

## 2020-07-03 ENCOUNTER — Other Ambulatory Visit: Payer: Self-pay

## 2020-07-03 DIAGNOSIS — C8190 Hodgkin lymphoma, unspecified, unspecified site: Secondary | ICD-10-CM

## 2020-07-03 DIAGNOSIS — I2510 Atherosclerotic heart disease of native coronary artery without angina pectoris: Secondary | ICD-10-CM | POA: Diagnosis not present

## 2020-07-03 DIAGNOSIS — Z8673 Personal history of transient ischemic attack (TIA), and cerebral infarction without residual deficits: Secondary | ICD-10-CM

## 2020-07-03 DIAGNOSIS — Z9861 Coronary angioplasty status: Secondary | ICD-10-CM | POA: Diagnosis not present

## 2020-07-03 LAB — LIPID PANEL
Chol/HDL Ratio: 3.2 ratio (ref 0.0–4.4)
Cholesterol, Total: 193 mg/dL (ref 100–199)
HDL: 60 mg/dL (ref >39)
LDL Chol Calc (NIH): 118 mg/dL — ABNORMAL HIGH (ref 0–99)
Triglycerides: 84 mg/dL (ref 0–149)
VLDL Cholesterol Cal: 15 mg/dL (ref 5–40)

## 2020-07-03 LAB — HEPATIC FUNCTION PANEL
ALT: 18 IU/L (ref 0–32)
AST: 26 IU/L (ref 0–40)
Albumin: 4.2 g/dL (ref 3.6–4.6)
Alkaline Phosphatase: 59 IU/L (ref 48–121)
Bilirubin Total: 0.5 mg/dL (ref 0.0–1.2)
Bilirubin, Direct: 0.17 mg/dL (ref 0.00–0.40)
Total Protein: 6.8 g/dL (ref 6.0–8.5)

## 2020-07-03 NOTE — Progress Notes (Unsigned)
Cbc

## 2020-07-04 ENCOUNTER — Inpatient Hospital Stay: Payer: Medicare PPO | Attending: Hematology

## 2020-07-04 ENCOUNTER — Telehealth: Payer: Self-pay

## 2020-07-04 ENCOUNTER — Inpatient Hospital Stay: Payer: Medicare PPO

## 2020-07-04 ENCOUNTER — Ambulatory Visit: Payer: Medicare Other

## 2020-07-04 ENCOUNTER — Other Ambulatory Visit: Payer: Self-pay

## 2020-07-04 VITALS — BP 138/68 | Temp 98.6°F | Resp 18

## 2020-07-04 DIAGNOSIS — C8111 Nodular sclerosis classical Hodgkin lymphoma, lymph nodes of head, face, and neck: Secondary | ICD-10-CM | POA: Diagnosis not present

## 2020-07-04 DIAGNOSIS — Z8379 Family history of other diseases of the digestive system: Secondary | ICD-10-CM | POA: Insufficient documentation

## 2020-07-04 DIAGNOSIS — C8190 Hodgkin lymphoma, unspecified, unspecified site: Secondary | ICD-10-CM

## 2020-07-04 DIAGNOSIS — Z79899 Other long term (current) drug therapy: Secondary | ICD-10-CM | POA: Insufficient documentation

## 2020-07-04 DIAGNOSIS — D509 Iron deficiency anemia, unspecified: Secondary | ICD-10-CM | POA: Diagnosis not present

## 2020-07-04 DIAGNOSIS — Z833 Family history of diabetes mellitus: Secondary | ICD-10-CM | POA: Insufficient documentation

## 2020-07-04 DIAGNOSIS — Z8249 Family history of ischemic heart disease and other diseases of the circulatory system: Secondary | ICD-10-CM | POA: Insufficient documentation

## 2020-07-04 DIAGNOSIS — Z818 Family history of other mental and behavioral disorders: Secondary | ICD-10-CM | POA: Diagnosis not present

## 2020-07-04 DIAGNOSIS — N1831 Chronic kidney disease, stage 3a: Secondary | ICD-10-CM | POA: Diagnosis not present

## 2020-07-04 DIAGNOSIS — Z95828 Presence of other vascular implants and grafts: Secondary | ICD-10-CM

## 2020-07-04 DIAGNOSIS — M810 Age-related osteoporosis without current pathological fracture: Secondary | ICD-10-CM | POA: Insufficient documentation

## 2020-07-04 DIAGNOSIS — E785 Hyperlipidemia, unspecified: Secondary | ICD-10-CM

## 2020-07-04 LAB — CMP (CANCER CENTER ONLY)
ALT: 18 U/L (ref 0–44)
AST: 25 U/L (ref 15–41)
Albumin: 3.8 g/dL (ref 3.5–5.0)
Alkaline Phosphatase: 50 U/L (ref 38–126)
Anion gap: 9 (ref 5–15)
BUN: 13 mg/dL (ref 8–23)
CO2: 25 mmol/L (ref 22–32)
Calcium: 9.3 mg/dL (ref 8.9–10.3)
Chloride: 107 mmol/L (ref 98–111)
Creatinine: 1.05 mg/dL — ABNORMAL HIGH (ref 0.44–1.00)
GFR, Est AFR Am: 56 mL/min — ABNORMAL LOW (ref >60)
GFR, Estimated: 49 mL/min — ABNORMAL LOW (ref >60)
Glucose, Bld: 116 mg/dL — ABNORMAL HIGH (ref 70–99)
Potassium: 4.2 mmol/L (ref 3.5–5.1)
Sodium: 141 mmol/L (ref 135–145)
Total Bilirubin: 0.6 mg/dL (ref 0.3–1.2)
Total Protein: 7 g/dL (ref 6.5–8.1)

## 2020-07-04 LAB — CBC WITH DIFFERENTIAL/PLATELET
Abs Immature Granulocytes: 0.03 10*3/uL (ref 0.00–0.07)
Basophils Absolute: 0.1 10*3/uL (ref 0.0–0.1)
Basophils Relative: 2 %
Eosinophils Absolute: 1 10*3/uL — ABNORMAL HIGH (ref 0.0–0.5)
Eosinophils Relative: 14 %
HCT: 38.9 % (ref 36.0–46.0)
Hemoglobin: 12.8 g/dL (ref 12.0–15.0)
Immature Granulocytes: 0 %
Lymphocytes Relative: 17 %
Lymphs Abs: 1.2 10*3/uL (ref 0.7–4.0)
MCH: 29.8 pg (ref 26.0–34.0)
MCHC: 32.9 g/dL (ref 30.0–36.0)
MCV: 90.7 fL (ref 80.0–100.0)
Monocytes Absolute: 0.6 10*3/uL (ref 0.1–1.0)
Monocytes Relative: 9 %
Neutro Abs: 4 10*3/uL (ref 1.7–7.7)
Neutrophils Relative %: 58 %
Platelets: 299 10*3/uL (ref 150–400)
RBC: 4.29 MIL/uL (ref 3.87–5.11)
RDW: 14.6 % (ref 11.5–15.5)
WBC: 6.9 10*3/uL (ref 4.0–10.5)
nRBC: 0 % (ref 0.0–0.2)

## 2020-07-04 MED ORDER — DENOSUMAB 60 MG/ML ~~LOC~~ SOSY
60.0000 mg | PREFILLED_SYRINGE | Freq: Once | SUBCUTANEOUS | Status: AC
  Administered 2020-07-04: 60 mg via SUBCUTANEOUS

## 2020-07-04 MED ORDER — DENOSUMAB 60 MG/ML ~~LOC~~ SOSY
PREFILLED_SYRINGE | SUBCUTANEOUS | Status: AC
  Filled 2020-07-04: qty 1

## 2020-07-04 MED ORDER — DENOSUMAB 120 MG/1.7ML ~~LOC~~ SOLN
SUBCUTANEOUS | Status: AC
  Filled 2020-07-04: qty 1.7

## 2020-07-04 NOTE — Telephone Encounter (Signed)
-----   Message from Josue Hector, MD sent at 07/03/2020  4:54 PM EDT ----- LDL 118 above goal increase crestor to 10 mg daily and repeat labs in 3 months

## 2020-07-04 NOTE — Telephone Encounter (Signed)
Patient called back. Informed patient of results. Per Dr. Johnsie Cancel, LDL 118 above goal increase crestor to 10 mg daily and repeat labs in 3 months. Patient stated she is on Crestor 10 mg but she does not take it every day. Patient stated she will start taking her Crestor daily. Patient verbalized understanding and will come in on 10/02/20 for repeat lab work.

## 2020-07-04 NOTE — Patient Instructions (Addendum)
Denosumab injection What is this medicine? DENOSUMAB (den oh sue mab) slows bone breakdown. Prolia is used to treat osteoporosis in women after menopause and in men, and in people who are taking corticosteroids for 6 months or more. Xgeva is used to treat a high calcium level due to cancer and to prevent bone fractures and other bone problems caused by multiple myeloma or cancer bone metastases. Xgeva is also used to treat giant cell tumor of the bone. This medicine may be used for other purposes; ask your health care provider or pharmacist if you have questions. COMMON BRAND NAME(S): Prolia, XGEVA What should I tell my health care provider before I take this medicine? They need to know if you have any of these conditions:  dental disease  having surgery or tooth extraction  infection  kidney disease  low levels of calcium or Vitamin D in the blood  malnutrition  on hemodialysis  skin conditions or sensitivity  thyroid or parathyroid disease  an unusual reaction to denosumab, other medicines, foods, dyes, or preservatives  pregnant or trying to get pregnant  breast-feeding How should I use this medicine? This medicine is for injection under the skin. It is given by a health care professional in a hospital or clinic setting. A special MedGuide will be given to you before each treatment. Be sure to read this information carefully each time. For Prolia, talk to your pediatrician regarding the use of this medicine in children. Special care may be needed. For Xgeva, talk to your pediatrician regarding the use of this medicine in children. While this drug may be prescribed for children as young as 13 years for selected conditions, precautions do apply. Overdosage: If you think you have taken too much of this medicine contact a poison control center or emergency room at once. NOTE: This medicine is only for you. Do not share this medicine with others. What if I miss a dose? It is  important not to miss your dose. Call your doctor or health care professional if you are unable to keep an appointment. What may interact with this medicine? Do not take this medicine with any of the following medications:  other medicines containing denosumab This medicine may also interact with the following medications:  medicines that lower your chance of fighting infection  steroid medicines like prednisone or cortisone This list may not describe all possible interactions. Give your health care provider a list of all the medicines, herbs, non-prescription drugs, or dietary supplements you use. Also tell them if you smoke, drink alcohol, or use illegal drugs. Some items may interact with your medicine. What should I watch for while using this medicine? Visit your doctor or health care professional for regular checks on your progress. Your doctor or health care professional may order blood tests and other tests to see how you are doing. Call your doctor or health care professional for advice if you get a fever, chills or sore throat, or other symptoms of a cold or flu. Do not treat yourself. This drug may decrease your body's ability to fight infection. Try to avoid being around people who are sick. You should make sure you get enough calcium and vitamin D while you are taking this medicine, unless your doctor tells you not to. Discuss the foods you eat and the vitamins you take with your health care professional. See your dentist regularly. Brush and floss your teeth as directed. Before you have any dental work done, tell your dentist you are   receiving this medicine. Do not become pregnant while taking this medicine or for 5 months after stopping it. Talk with your doctor or health care professional about your birth control options while taking this medicine. Women should inform their doctor if they wish to become pregnant or think they might be pregnant. There is a potential for serious side  effects to an unborn child. Talk to your health care professional or pharmacist for more information. What side effects may I notice from receiving this medicine? Side effects that you should report to your doctor or health care professional as soon as possible:  allergic reactions like skin rash, itching or hives, swelling of the face, lips, or tongue  bone pain  breathing problems  dizziness  jaw pain, especially after dental work  redness, blistering, peeling of the skin  signs and symptoms of infection like fever or chills; cough; sore throat; pain or trouble passing urine  signs of low calcium like fast heartbeat, muscle cramps or muscle pain; pain, tingling, numbness in the hands or feet; seizures  unusual bleeding or bruising  unusually weak or tired Side effects that usually do not require medical attention (report to your doctor or health care professional if they continue or are bothersome):  constipation  diarrhea  headache  joint pain  loss of appetite  muscle pain  runny nose  tiredness  upset stomach This list may not describe all possible side effects. Call your doctor for medical advice about side effects. You may report side effects to FDA at 1-800-FDA-1088. Where should I keep my medicine? This medicine is only given in a clinic, doctor's office, or other health care setting and will not be stored at home. NOTE: This sheet is a summary. It may not cover all possible information. If you have questions about this medicine, talk to your doctor, pharmacist, or health care provider.  2020 Elsevier/Gold Standard (2018-04-15 16:10:44)  

## 2020-07-17 ENCOUNTER — Other Ambulatory Visit: Payer: Medicare PPO

## 2020-07-17 ENCOUNTER — Other Ambulatory Visit: Payer: Self-pay

## 2020-07-17 DIAGNOSIS — E7800 Pure hypercholesterolemia, unspecified: Secondary | ICD-10-CM | POA: Diagnosis not present

## 2020-07-17 DIAGNOSIS — E039 Hypothyroidism, unspecified: Secondary | ICD-10-CM

## 2020-07-17 DIAGNOSIS — E785 Hyperlipidemia, unspecified: Secondary | ICD-10-CM | POA: Diagnosis not present

## 2020-07-18 LAB — LIPID PANEL
Chol/HDL Ratio: 2.8 ratio (ref 0.0–4.4)
Cholesterol, Total: 175 mg/dL (ref 100–199)
HDL: 62 mg/dL (ref >39)
LDL Chol Calc (NIH): 99 mg/dL (ref 0–99)
Triglycerides: 76 mg/dL (ref 0–149)
VLDL Cholesterol Cal: 14 mg/dL (ref 5–40)

## 2020-07-18 LAB — ALT: ALT: 24 IU/L (ref 0–32)

## 2020-07-18 LAB — TSH: TSH: 4.62 u[IU]/mL — ABNORMAL HIGH (ref 0.450–4.500)

## 2020-07-18 LAB — T4, FREE: Free T4: 1.1 ng/dL (ref 0.82–1.77)

## 2020-08-05 ENCOUNTER — Other Ambulatory Visit: Payer: Self-pay | Admitting: Physician Assistant

## 2020-08-05 DIAGNOSIS — I10 Essential (primary) hypertension: Secondary | ICD-10-CM

## 2020-08-24 ENCOUNTER — Other Ambulatory Visit: Payer: Self-pay | Admitting: Physician Assistant

## 2020-08-24 DIAGNOSIS — E039 Hypothyroidism, unspecified: Secondary | ICD-10-CM

## 2020-09-20 ENCOUNTER — Other Ambulatory Visit: Payer: Self-pay | Admitting: Family Medicine

## 2020-09-20 DIAGNOSIS — Z1231 Encounter for screening mammogram for malignant neoplasm of breast: Secondary | ICD-10-CM

## 2020-09-23 DIAGNOSIS — Z23 Encounter for immunization: Secondary | ICD-10-CM | POA: Diagnosis not present

## 2020-10-02 ENCOUNTER — Other Ambulatory Visit: Payer: Medicare PPO

## 2020-10-04 DIAGNOSIS — Z8249 Family history of ischemic heart disease and other diseases of the circulatory system: Secondary | ICD-10-CM | POA: Diagnosis not present

## 2020-10-04 DIAGNOSIS — Z809 Family history of malignant neoplasm, unspecified: Secondary | ICD-10-CM | POA: Diagnosis not present

## 2020-10-04 DIAGNOSIS — I25119 Atherosclerotic heart disease of native coronary artery with unspecified angina pectoris: Secondary | ICD-10-CM | POA: Diagnosis not present

## 2020-10-04 DIAGNOSIS — R32 Unspecified urinary incontinence: Secondary | ICD-10-CM | POA: Diagnosis not present

## 2020-10-04 DIAGNOSIS — Z7982 Long term (current) use of aspirin: Secondary | ICD-10-CM | POA: Diagnosis not present

## 2020-10-04 DIAGNOSIS — C8590 Non-Hodgkin lymphoma, unspecified, unspecified site: Secondary | ICD-10-CM | POA: Diagnosis not present

## 2020-10-04 DIAGNOSIS — E039 Hypothyroidism, unspecified: Secondary | ICD-10-CM | POA: Diagnosis not present

## 2020-10-04 DIAGNOSIS — Z87891 Personal history of nicotine dependence: Secondary | ICD-10-CM | POA: Diagnosis not present

## 2020-10-04 DIAGNOSIS — J45909 Unspecified asthma, uncomplicated: Secondary | ICD-10-CM | POA: Diagnosis not present

## 2020-10-04 DIAGNOSIS — I70209 Unspecified atherosclerosis of native arteries of extremities, unspecified extremity: Secondary | ICD-10-CM | POA: Diagnosis not present

## 2020-10-04 DIAGNOSIS — G8929 Other chronic pain: Secondary | ICD-10-CM | POA: Diagnosis not present

## 2020-10-04 DIAGNOSIS — E785 Hyperlipidemia, unspecified: Secondary | ICD-10-CM | POA: Diagnosis not present

## 2020-10-04 DIAGNOSIS — L409 Psoriasis, unspecified: Secondary | ICD-10-CM | POA: Diagnosis not present

## 2020-10-04 DIAGNOSIS — I10 Essential (primary) hypertension: Secondary | ICD-10-CM | POA: Diagnosis not present

## 2020-10-04 DIAGNOSIS — Z833 Family history of diabetes mellitus: Secondary | ICD-10-CM | POA: Diagnosis not present

## 2020-10-09 ENCOUNTER — Other Ambulatory Visit: Payer: Self-pay

## 2020-10-09 ENCOUNTER — Other Ambulatory Visit: Payer: Medicare PPO | Admitting: *Deleted

## 2020-10-09 DIAGNOSIS — E785 Hyperlipidemia, unspecified: Secondary | ICD-10-CM | POA: Diagnosis not present

## 2020-10-09 LAB — HEPATIC FUNCTION PANEL
ALT: 27 IU/L (ref 0–32)
AST: 31 IU/L (ref 0–40)
Albumin: 4.6 g/dL (ref 3.6–4.6)
Alkaline Phosphatase: 64 IU/L (ref 44–121)
Bilirubin Total: 0.5 mg/dL (ref 0.0–1.2)
Bilirubin, Direct: 0.15 mg/dL (ref 0.00–0.40)
Total Protein: 7.1 g/dL (ref 6.0–8.5)

## 2020-10-09 LAB — LIPID PANEL
Chol/HDL Ratio: 3 ratio (ref 0.0–4.4)
Cholesterol, Total: 187 mg/dL (ref 100–199)
HDL: 63 mg/dL (ref >39)
LDL Chol Calc (NIH): 108 mg/dL — ABNORMAL HIGH (ref 0–99)
Triglycerides: 91 mg/dL (ref 0–149)
VLDL Cholesterol Cal: 16 mg/dL (ref 5–40)

## 2020-10-10 ENCOUNTER — Telehealth: Payer: Self-pay | Admitting: Cardiovascular Disease

## 2020-10-10 NOTE — Telephone Encounter (Signed)
The patient has been notified of the result and verbalized understanding.  All questions (if any) were answered. Michaelyn Barter, RN 10/10/2020 3:56 PM

## 2020-10-10 NOTE — Telephone Encounter (Signed)
Patient is returning call to discuss results from lab work completed on 10/09/20.

## 2020-10-17 ENCOUNTER — Other Ambulatory Visit: Payer: Self-pay

## 2020-10-17 ENCOUNTER — Ambulatory Visit (INDEPENDENT_AMBULATORY_CARE_PROVIDER_SITE_OTHER): Payer: Medicare PPO | Admitting: Physician Assistant

## 2020-10-17 ENCOUNTER — Encounter: Payer: Self-pay | Admitting: Physician Assistant

## 2020-10-17 VITALS — BP 116/70 | HR 70 | Ht 61.5 in | Wt 136.4 lb

## 2020-10-17 DIAGNOSIS — G4700 Insomnia, unspecified: Secondary | ICD-10-CM | POA: Diagnosis not present

## 2020-10-17 DIAGNOSIS — Z1159 Encounter for screening for other viral diseases: Secondary | ICD-10-CM

## 2020-10-17 DIAGNOSIS — E039 Hypothyroidism, unspecified: Secondary | ICD-10-CM | POA: Diagnosis not present

## 2020-10-17 DIAGNOSIS — I10 Essential (primary) hypertension: Secondary | ICD-10-CM | POA: Diagnosis not present

## 2020-10-17 DIAGNOSIS — Z0000 Encounter for general adult medical examination without abnormal findings: Secondary | ICD-10-CM

## 2020-10-17 NOTE — Patient Instructions (Signed)
Insomnia Insomnia is a sleep disorder that makes it difficult to fall asleep or stay asleep. Insomnia can cause fatigue, low energy, difficulty concentrating, mood swings, and poor performance at work or school. There are three different ways to classify insomnia:  Difficulty falling asleep.  Difficulty staying asleep.  Waking up too early in the morning. Any type of insomnia can be long-term (chronic) or short-term (acute). Both are common. Short-term insomnia usually lasts for three months or less. Chronic insomnia occurs at least three times a week for longer than three months. What are the causes? Insomnia may be caused by another condition, situation, or substance, such as:  Anxiety.  Certain medicines.  Gastroesophageal reflux disease (GERD) or other gastrointestinal conditions.  Asthma or other breathing conditions.  Restless legs syndrome, sleep apnea, or other sleep disorders.  Chronic pain.  Menopause.  Stroke.  Abuse of alcohol, tobacco, or illegal drugs.  Mental health conditions, such as depression.  Caffeine.  Neurological disorders, such as Alzheimer's disease.  An overactive thyroid (hyperthyroidism). Sometimes, the cause of insomnia may not be known. What increases the risk? Risk factors for insomnia include:  Gender. Women are affected more often than men.  Age. Insomnia is more common as you get older.  Stress.  Lack of exercise.  Irregular work schedule or working night shifts.  Traveling between different time zones.  Certain medical and mental health conditions. What are the signs or symptoms? If you have insomnia, the main symptom is having trouble falling asleep or having trouble staying asleep. This may lead to other symptoms, such as:  Feeling fatigued or having low energy.  Feeling nervous about going to sleep.  Not feeling rested in the morning.  Having trouble concentrating.  Feeling irritable, anxious, or  depressed. How is this diagnosed? This condition may be diagnosed based on:  Your symptoms and medical history. Your health care provider may ask about: ? Your sleep habits. ? Any medical conditions you have. ? Your mental health.  A physical exam. How is this treated? Treatment for insomnia depends on the cause. Treatment may focus on treating an underlying condition that is causing insomnia. Treatment may also include:  Medicines to help you sleep.  Counseling or therapy.  Lifestyle adjustments to help you sleep better. Follow these instructions at home: Eating and drinking   Limit or avoid alcohol, caffeinated beverages, and cigarettes, especially close to bedtime. These can disrupt your sleep.  Do not eat a large meal or eat spicy foods right before bedtime. This can lead to digestive discomfort that can make it hard for you to sleep. Sleep habits   Keep a sleep diary to help you and your health care provider figure out what could be causing your insomnia. Write down: ? When you sleep. ? When you wake up during the night. ? How well you sleep. ? How rested you feel the next day. ? Any side effects of medicines you are taking. ? What you eat and drink.  Make your bedroom a dark, comfortable place where it is easy to fall asleep. ? Put up shades or blackout curtains to block light from outside. ? Use a white noise machine to block noise. ? Keep the temperature cool.  Limit screen use before bedtime. This includes: ? Watching TV. ? Using your smartphone, tablet, or computer.  Stick to a routine that includes going to bed and waking up at the same times every day and night. This can help you fall asleep faster.  Consider making a quiet activity, such as reading, part of your nighttime routine.  Try to avoid taking naps during the day so that you sleep better at night.  Get out of bed if you are still awake after 15 minutes of trying to sleep. Keep the lights down, but  try reading or doing a quiet activity. When you feel sleepy, go back to bed. General instructions  Take over-the-counter and prescription medicines only as told by your health care provider.  Exercise regularly, as told by your health care provider. Avoid exercise starting several hours before bedtime.  Use relaxation techniques to manage stress. Ask your health care provider to suggest some techniques that may work well for you. These may include: ? Breathing exercises. ? Routines to release muscle tension. ? Visualizing peaceful scenes.  Make sure that you drive carefully. Avoid driving if you feel very sleepy.  Keep all follow-up visits as told by your health care provider. This is important. Contact a health care provider if:  You are tired throughout the day.  You have trouble in your daily routine due to sleepiness.  You continue to have sleep problems, or your sleep problems get worse. Get help right away if:  You have serious thoughts about hurting yourself or someone else. If you ever feel like you may hurt yourself or others, or have thoughts about taking your own life, get help right away. You can go to your nearest emergency department or call:  Your local emergency services (911 in the [place]  A suicide crisis helpline, such as the [facility] at 507 697 9708. This is open 24 hours a day. Summary  Insomnia is a sleep disorder that makes it difficult to fall asleep or stay asleep.  Insomnia can be long-term (chronic) or short-term (acute).  Treatment for insomnia depends on the cause. Treatment may focus on treating an underlying condition that is causing insomnia.  Keep a sleep diary to help you and your health care provider figure out what could be causing your insomnia. This information is not intended to replace advice given to you by your health care provider. Make sure you discuss any questions you have with your health care  provider. Document Revised: 11/19/2017 Document Reviewed: 09/16/2017 Elsevier Patient Education  2020 [place] 65 Years and Older, Female Preventive care refers to lifestyle choices and visits with your health care provider that can promote health and wellness. This includes:  A yearly physical exam. This is also called an annual well check.  Regular dental and eye exams.  Immunizations.  Screening for certain conditions.  Healthy lifestyle choices, such as diet and exercise. What can I expect for my preventive care visit? Physical exam Your health care provider will check:  Height and weight. These may be used to calculate body mass index (BMI), which is a measurement that tells if you are at a healthy weight.  Heart rate and blood pressure.  Your skin for abnormal spots. Counseling Your health care provider may ask you questions about:  Alcohol, tobacco, and drug use.  Emotional well-being.  Home and relationship well-being.  Sexual activity.  Eating habits.  History of falls.  Memory and ability to understand (cognition).  Work and work Statistician.  Pregnancy and menstrual history. What immunizations do I need?  Influenza (flu) vaccine  This is recommended every year. Tetanus, diphtheria, and pertussis (Tdap) vaccine  You may need a Td booster every 10 years. Varicella (chickenpox) vaccine  You  may need this vaccine if you have not already been vaccinated. Zoster (shingles) vaccine  You may need this after age 97. Pneumococcal conjugate (PCV13) vaccine  One dose is recommended after age 72. Pneumococcal polysaccharide (PPSV23) vaccine  One dose is recommended after age 56. Measles, mumps, and rubella (MMR) vaccine  You may need at least one dose of MMR if you were born in 1957 or later. You may also need a second dose. Meningococcal conjugate (MenACWY) vaccine  You may need this if you have certain conditions. Hepatitis  A vaccine  You may need this if you have certain conditions or if you travel or work in places where you may be exposed to hepatitis A. Hepatitis B vaccine  You may need this if you have certain conditions or if you travel or work in places where you may be exposed to hepatitis B. Haemophilus influenzae type b (Hib) vaccine  You may need this if you have certain conditions. You may receive vaccines as individual doses or as more than one vaccine together in one shot (combination vaccines). Talk with your health care provider about the risks and benefits of combination vaccines. What tests do I need? Blood tests  Lipid and cholesterol levels. These may be checked every 5 years, or more frequently depending on your overall health.  Hepatitis C test.  Hepatitis B test. Screening  Lung cancer screening. You may have this screening every year starting at age 63 if you have a 30-pack-year history of smoking and currently smoke or have quit within the past 15 years.  Colorectal cancer screening. All adults should have this screening starting at age 9 and continuing until age 50. Your health care provider may recommend screening at age 50 if you are at increased risk. You will have tests every 1-10 years, depending on your results and the type of screening test.  Diabetes screening. This is done by checking your blood sugar (glucose) after you have not eaten for a while (fasting). You may have this done every 1-3 years.  Mammogram. This may be done every 1-2 years. Talk with your health care provider about how often you should have regular mammograms.  BRCA-related cancer screening. This may be done if you have a family history of breast, ovarian, tubal, or peritoneal cancers. Other tests  Sexually transmitted disease (STD) testing.  Bone density scan. This is done to screen for osteoporosis. You may have this done starting at age 74. Follow these instructions at home: Eating and  drinking  Eat a diet that includes fresh fruits and vegetables, whole grains, lean protein, and low-fat dairy products. Limit your intake of foods with high amounts of sugar, saturated fats, and salt.  Take vitamin and mineral supplements as recommended by your health care provider.  Do not drink alcohol if your health care provider tells you not to drink.  If you drink alcohol: ? Limit how much you have to 0-1 drink a day. ? Be aware of how much alcohol is in your drink. In the [place] one drink equals one 12 oz bottle of beer (355 mL), one 5 oz glass of wine (148 mL), or one 1 oz glass of hard liquor (44 mL). Lifestyle  Take daily care of your teeth and gums.  Stay active. Exercise for at least 30 minutes on 5 or more days each week.  Do not use any products that contain nicotine or tobacco, such as cigarettes, e-cigarettes, and chewing tobacco. If you need help quitting, ask  your health care provider.  If you are sexually active, practice safe sex. Use a condom or other form of protection in order to prevent STIs (sexually transmitted infections).  Talk with your health care provider about taking a low-dose aspirin or statin. What's next?  Go to your health care provider once a year for a well check visit.  Ask your health care provider how often you should have your eyes and teeth checked.  Stay up to date on all vaccines. This information is not intended to replace advice given to you by your health care provider. Make sure you discuss any questions you have with your health care provider. Document Revised: 12/01/2018 Document Reviewed: 12/01/2018 Elsevier Patient Education  2020 [employer].

## 2020-10-17 NOTE — Progress Notes (Signed)
Subjective:   Kim Lane is a 86 y.o. female who presents for Medicare Annual (Subsequent) preventive examination.  Review of Systems    General:   No F/C, wt loss Pulm:   No DIB, SOB, pleuritic chest pain Card:  No CP, palpitations Abd:  No n/v/d or pain Ext:  No inc edema from baseline     Objective:    Today's Vitals   10/17/20 0913  BP: 116/70  Pulse: 70  SpO2: 97%  Weight: 136 lb 6.4 oz (61.9 kg)  Height: 5' 1.5" (1.562 m)   Body mass index is 25.36 kg/m.  Advanced Directives 05/06/2020 01/05/2019 10/28/2017 07/07/2017 07/02/2017 06/16/2017 06/16/2017  Does Patient Have a Medical Advance Directive? Yes Yes Yes Yes Yes Yes Yes  Type of Paramedic of [place];Living will [place];Living will [place];Living will [place];Living will [place];Living will [place];Living will [place]  Does patient want to make changes to medical advance directive? No - Patient declined No - Patient declined No - Patient declined - No - Patient declined No - Patient declined -  Copy of [place] in Chart? No - copy requested - Yes Yes - Yes Yes    Current Medications (verified) Outpatient Encounter Medications as of 10/17/2020  Medication Sig  . aspirin 81 MG tablet Take 81 mg by mouth every other day.   . carvedilol (COREG) 3.125 MG tablet TAKE 1 TABLET BY MOUTH EVERY OTHER MORNING  . Cholecalciferol (HM VITAMIN D3) 100 MCG (4000 UT) CAPS 4,000 IU QD  . diclofenac Sodium (VOLTAREN) 1 % GEL Apply 2 g topically 4 (four) times daily. To affected joint for pain prn  . levothyroxine (SYNTHROID) 88 MCG tablet TAKE 1 TABLEY BY MOUTH DAILY BEFORE BREAKFAST  . Melatonin 2.5 MG CAPS Take 1 capsule by mouth at bedtime.  . Multiple Vitamins-Minerals (CENTRUM SILVER PO) Take 1 tablet by mouth daily.    . nitroGLYCERIN (NITROLINGUAL) 0.4  MG/SPRAY spray Place 1 spray under the tongue every 5 (five) minutes x 3 doses as needed for chest pain (3 sprays max).  . Omega-3 Fatty Acids (FISH OIL) 1000 MG CAPS Take 1 capsule by mouth daily.   [name] Kitchen PROAIR HFA 108 (90 Base) MCG/ACT inhaler Inhale 2 puffs into the lungs every 4 (four) hours as needed for shortness of breath.   . RESTASIS 0.05 % ophthalmic emulsion Place 2 drops into both eyes 2 (two) times daily. For dry eyes  . rosuvastatin (CRESTOR) 10 MG tablet Take 1 tablet (10 mg total) by mouth at bedtime.  . Coenzyme Q10 100 MG capsule Take 100 mg by mouth daily.   [name] Kitchen triamcinolone cream (KENALOG) 0.1 % Apply 1 application topically 2 (two) times daily. (Patient not taking: Reported on 10/17/2020)   No facility-administered encounter medications on file as of 10/17/2020.    Allergies (verified) Fosamax [alendronate sodium]   History: Past Medical History:  Diagnosis Date  . Anemia   . Anginal pain ([place])    occ  . Arthritis   . Blood dyscrasia 12/2016   hodgins lymphoma tx-remission  . CAD (coronary artery disease) 02/2006   Taxus stent placed to LAD and Diagonal per Dr. Olevia Perches  . Cataracts, bilateral   . Complication of anesthesia   . HTN (hypertension)   . Hypercholesterolemia   . Hypothyroidism   . IBS (irritable bowel syndrome)   . Osteoarthritis   .  PONV (postoperative nausea and vomiting)   . Stroke [facility]) 03/2016   Past Surgical History:  Procedure Laterality Date  . APPENDECTOMY    . IR GENERIC HISTORICAL  01/25/2017   IR US GUIDE VASC ACCESS RIGHT 01/25/2017 [name], MD [facility]-INTER[facility] RAD  . IR GENERIC HISTORICAL  01/25/2017   IR FLUORO GUIDE PORT INSERTION RIGHT 01/25/2017 [name], MD [facility]  . IR REMOVAL TUN ACCESS W/ PORT W/O FL MOD SED  10/28/2017  . KYPHOPLASTY N/A 06/24/2017   Procedure: KYPHOPLASTY T10;  Surgeon: Melina Schools, MD;  Location: [place];  Service: Orthopedics;  Laterality: N/A;  90 mins  . LEFT HEART CATH AND CORONARY ANGIOGRAPHY N/A  06/16/2017   Procedure: Left Heart Cath and Coronary Angiography;  Surgeon: Sherren Mocha, MD;  Location: [place] CV LAB;  Service: Cardiovascular;  Laterality: N/A;  . MASS EXCISION Right 12/22/2016   Procedure: RIGHT NECK LYMPH NODE BIOPSY;  Surgeon: Rozetta Nunnery, MD;  Location: [place];  Service: ENT;  Laterality: Right;  . SPINE SURGERY    . stents     in heart  . TONSILLECTOMY    . TOTAL KNEE ARTHROPLASTY Bilateral 2011,2012  . VAGINAL HYSTERECTOMY     Family History  Problem Relation Age of Onset  . Diabetes Mother   . Dementia Mother   . ALS Mother   . Heart Problems Father   . Liver disease Sister   . Cirrhosis Sister   . Heart block Other        CABG  . Diabetes Sister   . Heart block Son        CABG  . Other [place]  . Other Daughter        [place]  . Dementia Sister   . Breast cancer Neg Hx    Social History   Socioeconomic History  . Marital status: Widowed    Spouse name: Not on file  . Number of children: 2  . Years of education: 50  . 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 Smoking status: Former Research scientist (life sciences)  . Smokeless tobacco: Never Used  . Tobacco comment: quit smoking 30 years ago, very light smoker  Vaping Use  . Vaping Use: Never used  Substance and Sexual Activity  . Alcohol use: No    Alcohol/week: 0.0 standard drinks  . Drug use: No  . Sexual activity: Not on file  Other Topics Concern  . Not on file  Social History Narrative   08/29/19 Lives alone   caffeine use- coffee- 5 cups daily   Social Determinants of Health   Financial Resource Strain:   . Difficulty of Paying Living Expenses: Not on file  Food Insecurity:   . Worried About Charity fundraiser in the Last Year: Not on file  . Ran Out of Food in the Last Year: Not on file  Transportation Needs:   . Lack of Transportation  (Medical): Not on file  . Lack of Transportation (Non-Medical): Not on file  Physical Activity:   . Days of Exercise per Week: Not on file  . Minutes of Exercise per Session: Not on file  Stress:   . Feeling of Stress : Not on file  Social Connections:   . Frequency of Communication with Friends and Family: Not on file  .  Frequency of Social Gatherings with Friends and Family: Not on file  . Attends Religious Services: Not on file  . Active Member of Clubs or Organizations: Not on file  . Attends Archivist Meetings: Not on file  . Marital Status: Not on file    Tobacco Counseling Counseling given: Not Answered Comment: quit smoking 30 years ago, very light smoker    Diabetic?No         Activities of Daily Living In your present state of health, do you have any difficulty performing the following activities: 10/17/2020  Hearing? Y  Vision? Y  Difficulty concentrating or making decisions? N  Walking or climbing stairs? Y  Dressing or bathing? N  Doing errands, shopping? N  Some recent data might be hidden    Patient Care Team: Lorrene Reid, PA-C as PCP - General Josue Hector, MD as PCP - Cardiology (Cardiology) Josue Hector, MD as Consulting Physician (Cardiology) Penni Bombard, MD as Consulting Physician (Neurology) Jarome Matin, MD as Consulting Physician (Dermatology) Rozetta Nunnery, MD as Consulting Physician (Otolaryngology) Brunetta Genera, MD as Consulting Physician (Hematology) Melina Schools, MD as Consulting Physician (Orthopedic Surgery) Renette Butters, MD as Attending Physician (Orthopedic Surgery)  Indicate any recent Medical Services you may have received from other than [facility] providers in the past year (date may be approximate).     Assessment:   This is a routine wellness examination for [place].  Hearing/Vision screen No exam data present  Dietary issues and exercise activities discussed:  -Follow heart  healthy diet and stay well hydrated. Continue to stay as active as possible.  Goals   None    Depression Screen PHQ 2/9 Scores 10/17/2020 04/17/2020 08/03/2019 02/23/2019 12/07/2018 10/10/2018 08/10/2018  PHQ - 2 Score 0 1 1 1  0 1 0  PHQ- 9 Score 5 5 7 6 5 5 4     Fall Risk Fall Risk  10/17/2020 08/29/2019 02/23/2019 08/10/2018 04/05/2018  Falls in the past year? 0 1 0 No No  Number falls in past yr: - 0 - - -  Comment - passed out - - -  Injury with Fall? - 1 - - -  Comment - abrasion on nose - - -  Risk for fall due to : - - Impaired balance/gait;Impaired mobility - -  Follow up Falls evaluation completed - Falls evaluation completed - -    Any stairs in or around the home? Yes  If so, are there any without handrails? Yes  Home free of loose throw rugs in walkways, pet beds, electrical cords, etc? Yes  Adequate lighting in your home to reduce risk of falls? Yes   ASSISTIVE DEVICES UTILIZED TO PREVENT FALLS:  Life alert? Yes  Use of a cane, walker or w/c? Yes  Grab bars in the bathroom? Yes  Shower chair or bench in shower? Yes  Elevated toilet seat or a handicapped toilet? Yes   TIMED UP AND GO:  Was the test performed? Yes .  Length of time to ambulate 10 feet: 15 sec.   Gait slow and steady with assistive device  Cognitive Function: wnl     6CIT Screen 10/17/2020  What Year? 0 points  What month? 0 points  What time? 0 points  Count back from 20 0 points  Months in reverse 0 points  Repeat phrase 0 points  Total Score 0    Immunizations Immunization History  Administered Date(s) Administered  . Influenza-Unspecified 09/20/2013, 09/19/2014, 09/21/2015, 09/22/2017  .  PFIZER SARS-COV-2 Vaccination 01/26/2020, 02/20/2020  . Pneumococcal Conjugate-13 09/08/2016  . Pneumococcal-Unspecified 12/30/2010  . Td 12/01/2007  . Tdap 10/28/2018  . Zoster 11/30/2008    TDAP status: Up to date Flu Vaccine status: Up to date Pneumococcal vaccine status: Up to date Covid-19  vaccine status: Completed vaccines  Qualifies for Shingles Vaccine? Yes   Zostavax completed Patient states she has had these vaccines at a pharmacy but she doesnt remember which one.   Shingrix Completed?: No.    Education has been provided regarding the importance of this vaccine. Patient has been advised to call insurance company to determine out of pocket expense if they have not yet received this vaccine. Advised may also receive vaccine at local pharmacy or Health Dept. Verbalized acceptance and understanding.  Screening Tests Health Maintenance  Topic Date Due  . INFLUENZA VACCINE  07/21/2020  . TETANUS/TDAP  10/28/2028  . DEXA SCAN  Completed  . COVID-19 Vaccine  Completed  . PNA vac Low Risk Adult  Completed    Health Maintenance  Health Maintenance Due  Topic Date Due  . INFLUENZA VACCINE  07/21/2020    Colorectal cancer screening: No longer required.  Mammogram status: Ordered 11/04/20. Pt provided with contact info and advised to call to schedule appt.  Bone Density status: Completed 05/12/2018. Results reflect: Bone density results: NORMAL. Repeat every 0 years.  Lung Cancer Screening: (Low Dose CT Chest recommended if Age 43-80 years, 30 pack-year currently smoking OR have quit w/in 15years.) does not qualify.   Lung Cancer Screening Referral:   Additional Screening:  Hepatitis C Screening: does qualify; Completed   Vision Screening: Recommended annual ophthalmology exams for early detection of glaucoma and other disorders of the eye. Is the patient up to date with their annual eye exam?  Yes  Who is the provider or what is the name of the office in which the patient attends annual eye exams? Dr. Leeanne Deed If pt is not established with a provider, would they like to be referred to a provider to establish care? No .   Dental Screening: Recommended annual dental exams for proper oral hygiene  Community Resource Referral / Chronic Care Management: CRR required this  visit?  No   CCM required this visit?  No      Plan:   -Continue current medication regimen. -Continue to limit caffeine and take melatonin as needed for sleep. If insomnia worsens then will consider starting Trazodone to help with sleep. -Will collect CMP and thyroid labs. -Follow up in 4 months for reg OV: HTN, thyroid, insomnia  I have personally reviewed and noted the following in the patient's chart:   . Medical and social history . Use of alcohol, tobacco or illicit drugs  . Current medications and supplements . Functional ability and status . Nutritional status . Physical activity . Advanced directives . List of other physicians . Hospitalizations, surgeries, and ER visits in previous 12 months . Vitals . Screenings to include cognitive, depression, and falls . Referrals and appointments  In addition, I have reviewed and discussed with patient certain preventive protocols, quality metrics, and best practice recommendations. A written personalized care plan for preventive services as well as general preventive health recommendations were provided to patient.

## 2020-10-18 LAB — COMPREHENSIVE METABOLIC PANEL
ALT: 24 IU/L (ref 0–32)
AST: 33 IU/L (ref 0–40)
Albumin/Globulin Ratio: 1.7 (ref 1.2–2.2)
Albumin: 4.5 g/dL (ref 3.6–4.6)
Alkaline Phosphatase: 58 IU/L (ref 44–121)
BUN/Creatinine Ratio: 11 — ABNORMAL LOW (ref 12–28)
BUN: 13 mg/dL (ref 8–27)
Bilirubin Total: 0.5 mg/dL (ref 0.0–1.2)
CO2: 26 mmol/L (ref 20–29)
Calcium: 9.4 mg/dL (ref 8.7–10.3)
Chloride: 104 mmol/L (ref 96–106)
Creatinine, Ser: 1.15 mg/dL — ABNORMAL HIGH (ref 0.57–1.00)
GFR calc Af Amer: 50 mL/min/{1.73_m2} — ABNORMAL LOW (ref >59)
GFR calc non Af Amer: 44 mL/min/{1.73_m2} — ABNORMAL LOW (ref >59)
Globulin, Total: 2.6 g/dL (ref 1.5–4.5)
Glucose: 111 mg/dL — ABNORMAL HIGH (ref 65–99)
Potassium: 4.8 mmol/L (ref 3.5–5.2)
Sodium: 141 mmol/L (ref 134–144)
Total Protein: 7.1 g/dL (ref 6.0–8.5)

## 2020-10-18 LAB — TSH: TSH: 11.7 u[IU]/mL — ABNORMAL HIGH (ref 0.450–4.500)

## 2020-10-18 LAB — HEPATITIS C ANTIBODY: Hep C Virus Ab: <0.1 s/co ratio (ref 0.0–0.9)

## 2020-10-18 LAB — T4, FREE: Free T4: 0.93 ng/dL (ref 0.82–1.77)

## 2020-10-21 ENCOUNTER — Telehealth: Payer: Self-pay | Admitting: Physician Assistant

## 2020-10-21 DIAGNOSIS — E039 Hypothyroidism, unspecified: Secondary | ICD-10-CM

## 2020-10-21 MED ORDER — LEVOTHYROXINE SODIUM 100 MCG PO TABS: 100.0000 ug | ORAL_TABLET | Freq: Every day | ORAL | 0 refills | Status: DC

## 2020-10-21 NOTE — Telephone Encounter (Signed)
Left message for patient to call back.   Sent new RX of Levothyroxine 100MCG to [facility]. AS, CMA

## 2020-10-21 NOTE — Telephone Encounter (Signed)
-----   Message from Lorrene Reid, [place] sent at 10/21/2020  5:01 PM EDT ----- Please call Kim Lane and notify of lab results. CMP- kidney function has mildly declined from prior and recommend to avoid nephrotoxic substances such as NSAIDs like ibuprofen and stay well hydrated. Will continue to monitor. TSH has increased from 3 months ago and free T4 is normal but decreased from prior so will make adjustments to levothyroxine and increase to 100 mcg. I will send in a new rx and thyroid labs should be repeated in 6 weeks. Hep C screening is negative.  Thank you, Herb Grays

## 2020-11-04 ENCOUNTER — Ambulatory Visit: Payer: Medicare PPO

## 2020-11-06 ENCOUNTER — Other Ambulatory Visit: Payer: Self-pay | Admitting: Physician Assistant

## 2020-11-06 DIAGNOSIS — I10 Essential (primary) hypertension: Secondary | ICD-10-CM

## 2020-12-02 ENCOUNTER — Other Ambulatory Visit: Payer: Self-pay | Admitting: Physician Assistant

## 2020-12-02 DIAGNOSIS — E038 Other specified hypothyroidism: Secondary | ICD-10-CM

## 2020-12-04 ENCOUNTER — Inpatient Hospital Stay (HOSPITAL_BASED_OUTPATIENT_CLINIC_OR_DEPARTMENT_OTHER): Payer: Medicare PPO | Admitting: Hematology

## 2020-12-04 ENCOUNTER — Inpatient Hospital Stay: Payer: Medicare PPO | Attending: Hematology

## 2020-12-04 ENCOUNTER — Other Ambulatory Visit: Payer: Self-pay

## 2020-12-04 ENCOUNTER — Inpatient Hospital Stay: Payer: Medicare PPO

## 2020-12-04 ENCOUNTER — Telehealth: Payer: Self-pay | Admitting: Physician Assistant

## 2020-12-04 VITALS — BP 153/61 | HR 67 | Temp 97.7°F | Resp 18 | Ht 61.5 in | Wt 135.0 lb

## 2020-12-04 DIAGNOSIS — Z8379 Family history of other diseases of the digestive system: Secondary | ICD-10-CM | POA: Diagnosis not present

## 2020-12-04 DIAGNOSIS — N1831 Chronic kidney disease, stage 3a: Secondary | ICD-10-CM | POA: Diagnosis not present

## 2020-12-04 DIAGNOSIS — M8000XG Age-related osteoporosis with current pathological fracture, unspecified site, subsequent encounter for fracture with delayed healing: Secondary | ICD-10-CM

## 2020-12-04 DIAGNOSIS — D509 Iron deficiency anemia, unspecified: Secondary | ICD-10-CM | POA: Insufficient documentation

## 2020-12-04 DIAGNOSIS — E038 Other specified hypothyroidism: Secondary | ICD-10-CM

## 2020-12-04 DIAGNOSIS — Z8249 Family history of ischemic heart disease and other diseases of the circulatory system: Secondary | ICD-10-CM | POA: Diagnosis not present

## 2020-12-04 DIAGNOSIS — Z818 Family history of other mental and behavioral disorders: Secondary | ICD-10-CM | POA: Diagnosis not present

## 2020-12-04 DIAGNOSIS — C8190 Hodgkin lymphoma, unspecified, unspecified site: Secondary | ICD-10-CM

## 2020-12-04 DIAGNOSIS — Z833 Family history of diabetes mellitus: Secondary | ICD-10-CM | POA: Diagnosis not present

## 2020-12-04 DIAGNOSIS — M810 Age-related osteoporosis without current pathological fracture: Secondary | ICD-10-CM | POA: Insufficient documentation

## 2020-12-04 DIAGNOSIS — Z95828 Presence of other vascular implants and grafts: Secondary | ICD-10-CM

## 2020-12-04 DIAGNOSIS — Z79899 Other long term (current) drug therapy: Secondary | ICD-10-CM | POA: Diagnosis not present

## 2020-12-04 DIAGNOSIS — C8111 Nodular sclerosis classical Hodgkin lymphoma, lymph nodes of head, face, and neck: Secondary | ICD-10-CM | POA: Diagnosis not present

## 2020-12-04 LAB — CBC WITH DIFFERENTIAL/PLATELET
Abs Immature Granulocytes: 0.11 10*3/uL — ABNORMAL HIGH (ref 0.00–0.07)
Basophils Absolute: 0.1 10*3/uL (ref 0.0–0.1)
Basophils Relative: 2 %
Eosinophils Absolute: 0.8 10*3/uL — ABNORMAL HIGH (ref 0.0–0.5)
Eosinophils Relative: 11 %
HCT: 40.4 % (ref 36.0–46.0)
Hemoglobin: 12.8 g/dL (ref 12.0–15.0)
Immature Granulocytes: 2 %
Lymphocytes Relative: 18 %
Lymphs Abs: 1.3 10*3/uL (ref 0.7–4.0)
MCH: 28.8 pg (ref 26.0–34.0)
MCHC: 31.7 g/dL (ref 30.0–36.0)
MCV: 91 fL (ref 80.0–100.0)
Monocytes Absolute: 0.6 10*3/uL (ref 0.1–1.0)
Monocytes Relative: 8 %
Neutro Abs: 4.5 10*3/uL (ref 1.7–7.7)
Neutrophils Relative %: 59 %
Platelets: 421 10*3/uL — ABNORMAL HIGH (ref 150–400)
RBC: 4.44 MIL/uL (ref 3.87–5.11)
RDW: 13.9 % (ref 11.5–15.5)
WBC: 7.4 10*3/uL (ref 4.0–10.5)
nRBC: 0 % (ref 0.0–0.2)

## 2020-12-04 LAB — CMP (CANCER CENTER ONLY)
ALT: 26 U/L (ref 0–44)
AST: 27 U/L (ref 15–41)
Albumin: 4 g/dL (ref 3.5–5.0)
Alkaline Phosphatase: 62 U/L (ref 38–126)
Anion gap: 6 (ref 5–15)
BUN: 21 mg/dL (ref 8–23)
CO2: 30 mmol/L (ref 22–32)
Calcium: 9.6 mg/dL (ref 8.9–10.3)
Chloride: 104 mmol/L (ref 98–111)
Creatinine: 1.05 mg/dL — ABNORMAL HIGH (ref 0.44–1.00)
GFR, Estimated: 52 mL/min — ABNORMAL LOW (ref >60)
Glucose, Bld: 105 mg/dL — ABNORMAL HIGH (ref 70–99)
Potassium: 4.2 mmol/L (ref 3.5–5.1)
Sodium: 140 mmol/L (ref 135–145)
Total Bilirubin: 0.5 mg/dL (ref 0.3–1.2)
Total Protein: 7.7 g/dL (ref 6.5–8.1)

## 2020-12-04 LAB — SEDIMENTATION RATE: Sed Rate: 22 mm/hr (ref 0–22)

## 2020-12-04 LAB — VITAMIN D 25 HYDROXY (VIT D DEFICIENCY, FRACTURES): Vit D, 25-Hydroxy: 62.32 ng/mL (ref 30–100)

## 2020-12-04 MED ORDER — DENOSUMAB 60 MG/ML ~~LOC~~ SOSY
60.0000 mg | PREFILLED_SYRINGE | Freq: Once | SUBCUTANEOUS | Status: AC
  Administered 2020-12-04: 11:00:00 60 mg via SUBCUTANEOUS

## 2020-12-04 MED ORDER — DENOSUMAB 60 MG/ML ~~LOC~~ SOSY
PREFILLED_SYRINGE | SUBCUTANEOUS | Status: AC
  Filled 2020-12-04: qty 1

## 2020-12-04 NOTE — Telephone Encounter (Signed)
Cancer center called and accidentally released the labs for Friday. Please put labs back in and have them done on Friday as scheduled, thanks.

## 2020-12-04 NOTE — Progress Notes (Signed)
HEMATOLOGY/ONCOLOGY CLINIC NOTE  Date of Service: 12/04/20  Patient Care Team: Lorrene Reid, PA-C as PCP - General Josue Hector, MD as PCP - Cardiology (Cardiology) Josue Hector, MD as Consulting Physician (Cardiology) Penni Bombard, MD as Consulting Physician (Neurology) Jarome Matin, MD as Consulting Physician (Dermatology) Rozetta Nunnery, MD as Consulting Physician (Otolaryngology) Brunetta Genera, MD as Consulting Physician (Hematology) Melina Schools, MD as Consulting Physician (Orthopedic Surgery) Renette Butters, MD as Attending Physician (Orthopedic Surgery)  CHIEF COMPLAINTS/PURPOSE OF CONSULTATION:   f/u for continued treatment of Hodgkin's lymphoma  HISTORY OF PRESENTING ILLNESS:   plz see previous   INTERVAL HISTORY: Kim Lane is here for scheduled follow up for her Hodgkins lymphoma. Kim patient's last visit with [place] was on 05/06/2020. Kim Lane reports that she is doing well overall.  Kim Lane reports that she has felt weaker and is having more difficulty moving after long periods of being sedentary. This has not affected her ability to complete her daily tasks very much. She is having trouble getting restful sleep and takes naps occasionally. Lane is walking regularly but not daily.   She has received her annual flu vaccine, as well as Kim COVID19 vaccines and booster.   Lab results today (12/04/20) of CBC w/diff and CMP is as follows: all values are WNL except for PLT at 421K, Eos Abs at 0.8K, Abs Immature Granulocytes at 0.11K, Glucose at 105, Creatinine at 1.05, GFR Est at 52. 12/04/2020 Sed Rate at 22 12/04/2020 Vitamin D at 62.32  On review of systems, Lane reports rash, sleeplessness and denies low appetite, itching, new lumps/bumps, fevers, chills, night sweats, abdominal pain and any other symptoms.   MEDICAL HISTORY:  Past Medical History:  Diagnosis Date  . Anemia   . Anginal pain ([place])    occ  . Arthritis   . Blood  dyscrasia 12/2016   hodgins lymphoma tx-remission  . CAD (coronary artery disease) 02/2006   Taxus stent placed to LAD and Diagonal per Dr. Olevia Perches  . Cataracts, bilateral   . Complication of anesthesia   . HTN (hypertension)   . Hypercholesterolemia   . Hypothyroidism   . IBS (irritable bowel syndrome)   . Osteoarthritis   . PONV (postoperative nausea and vomiting)   . Stroke [facility]) 03/2016  Osteoporosis Cardiologist Dr. [name] -Dr. Earlean Shawl  SURGICAL HISTORY: Past Surgical History:  Procedure Laterality Date  . APPENDECTOMY    . IR GENERIC HISTORICAL  01/25/2017   IR US GUIDE VASC ACCESS RIGHT 01/25/2017 [name], MD [facility]  . IR GENERIC HISTORICAL  01/25/2017   IR FLUORO GUIDE PORT INSERTION RIGHT 01/25/2017 [name], MD [facility]  . IR REMOVAL TUN ACCESS W/ PORT W/O FL MOD SED  10/28/2017  . KYPHOPLASTY N/A 06/24/2017   Procedure: KYPHOPLASTY T10;  Surgeon: Melina Schools, MD;  Location: [place];  Service: Orthopedics;  Laterality: N/A;  90 mins  . LEFT HEART CATH AND CORONARY ANGIOGRAPHY N/A 06/16/2017   Procedure: Left Heart Cath and Coronary Angiography;  Surgeon: Sherren Mocha, MD;  Location: [place] CV [facility];  Service: Cardiovascular;  Laterality: N/A;  . MASS EXCISION Right 12/22/2016   Procedure: RIGHT NECK LYMPH NODE BIOPSY;  Surgeon: Rozetta Nunnery, MD;  Location: [place];  Service: ENT;  Laterality: Right;  . SPINE SURGERY    . stents     in heart  . TONSILLECTOMY    . TOTAL KNEE ARTHROPLASTY  Bilateral [id number]  . VAGINAL HYSTERECTOMY      SOCIAL HISTORY: Social History   Socioeconomic History  . Marital status: Widowed    Spouse name: Not on file  . Number of children: 2  . Years of education: 37  . 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 Smoking status: Former Research scientist (life sciences)  . Smokeless tobacco: Never Used  .  Tobacco comment: quit smoking 30 years ago, very light smoker  Vaping Use  . Vaping Use: Never used  Substance and Sexual Activity  . Alcohol use: No    Alcohol/week: 0.0 standard drinks  . Drug use: No  . Sexual activity: Not on file  Other Topics Concern  . Not on file  Social History Narrative   08/29/19 Lives alone   caffeine use- coffee- 5 cups daily   Social Determinants of Health   Financial Resource Strain: Not on file  Food Insecurity: Not on file  Transportation Needs: Not on file  Physical Activity: Not on file  Stress: Not on file  Social Connections: Not on file  Intimate Partner Violence: Not on file    FAMILY HISTORY: Family History  Problem Relation Age of Onset  . Diabetes Mother   . Dementia Mother   . ALS Mother   . Heart Problems Father   . Liver disease Sister   . Cirrhosis Sister   . Heart block Other        CABG  . Diabetes Sister   . Heart block Son        CABG  . Other [place]  . Other Daughter        [place]  . Dementia Sister   . Breast cancer Neg Hx     ALLERGIES:  is allergic to fosamax [alendronate sodium].  MEDICATIONS:  Current Outpatient Medications  Medication Sig Dispense Refill  . aspirin 81 MG tablet Take 81 mg by mouth every other day.     . carvedilol (COREG) 3.125 MG tablet TAKE 1 TABLET BY MOUTH EVERY OTHER MORNING 45 tablet 0  . Cholecalciferol (HM VITAMIN D3) 100 MCG (4000 UT) CAPS 4,000 IU QD 30 capsule   . Coenzyme Q10 100 MG capsule Take 100 mg by mouth daily.     . diclofenac Sodium (VOLTAREN) 1 % GEL Apply 2 g topically 4 (four) times daily. To affected joint for pain prn 100 g 0  . levothyroxine (SYNTHROID) 100 MCG tablet Take 1 tablet (100 mcg total) by mouth daily. 90 tablet 0  . Melatonin 2.5 MG CAPS Take 1 capsule by mouth at bedtime.    . Multiple Vitamins-Minerals (CENTRUM SILVER PO) Take 1 tablet by mouth daily.      . nitroGLYCERIN (NITROLINGUAL) 0.4 MG/SPRAY spray Place 1 spray  under Kim tongue every 5 (five) minutes x 3 doses as needed for chest pain (3 sprays max).    . Omega-3 Fatty Acids (FISH OIL) 1000 MG CAPS Take 1 capsule by mouth daily.     [name] Kitchen PROAIR HFA 108 (90 Base) MCG/ACT inhaler Inhale 2 puffs into Kim lungs every 4 (four) hours as needed for shortness of breath.     . RESTASIS 0.05 % ophthalmic emulsion Place 2 drops into both eyes 2 (two) times daily. For dry eyes    . rosuvastatin (CRESTOR) 10 MG tablet Take 1 tablet (  10 mg total) by mouth at bedtime. 90 tablet 3  . triamcinolone cream (KENALOG) 0.1 % Apply 1 application topically 2 (two) times daily. (Patient not taking: Reported on 10/17/2020) 60 g 0   No current facility-administered medications for this visit.    REVIEW OF SYSTEMS:  A 10+ POINT REVIEW OF SYSTEMS WAS OBTAINED including neurology, dermatology, psychiatry, cardiac, respiratory, lymph, extremities, GI, GU, Musculoskeletal, constitutional, breasts, reproductive, HEENT.  All pertinent positives are noted in Kim HPI.  All others are negative.   PHYSICAL EXAMINATION:  ECOG PERFORMANCE STATUS: 2 - Symptomatic, <50% confined to bed  Vitals:   12/04/20 0959  BP: (!) 153/61  Pulse: 67  Resp: 18  Temp: 97.7 F (36.5 C)  TempSrc: Tympanic  SpO2: 100%  Weight: 135 lb (61.2 kg)  Height: 5' 1.5" (1.562 m)  .  GENERAL:alert, in no acute distress and comfortable SKIN: no acute rashes, no significant lesions EYES: conjunctiva are pink and non-injected, sclera anicteric OROPHARYNX: MMM, no exudates, no oropharyngeal erythema or ulceration NECK: supple, no JVD LYMPH:  no palpable lymphadenopathy in Kim cervical, axillary or inguinal regions LUNGS: clear to auscultation b/l with normal respiratory effort HEART: regular rate & rhythm ABDOMEN:  normoactive bowel sounds , non tender, not distended. No palpable hepatosplenomegaly.  Extremity: no pedal edema PSYCH: alert & oriented x 3 with fluent speech NEURO: no focal motor/sensory  deficits  LABORATORY DATA:  I have reviewed Kim data as listed   . CBC Latest Ref Rng & Units 12/04/2020 07/04/2020 05/06/2020  WBC 4.0 - 10.5 K/uL 7.4 6.9 8.2  Hemoglobin 12.0 - 15.0 g/dL 12.8 12.8 13.2  Hematocrit 36.0 - 46.0 % 40.4 38.9 41.6  Platelets 150 - 400 K/uL 421(H) 299 334   . CMP Latest Ref Rng & Units 12/04/2020 10/17/2020 10/09/2020  Glucose 70 - 99 mg/dL 105(H) 111(H) -  BUN 8 - 23 mg/dL 21 13 -  Creatinine 0.44 - 1.00 mg/dL 1.05(H) 1.15(H) -  Sodium 135 - 145 mmol/L 140 141 -  Potassium 3.5 - 5.1 mmol/L 4.2 4.8 -  Chloride 98 - 111 mmol/L 104 104 -  CO2 22 - 32 mmol/L 30 26 -  Calcium 8.9 - 10.3 mg/dL 9.6 9.4 -  Total Protein 6.5 - 8.1 g/dL 7.7 7.1 7.1  Total Bilirubin 0.3 - 1.2 mg/dL 0.5 0.5 0.5  Alkaline Phos 38 - 126 U/L 62 58 64  AST 15 - 41 U/L 27 33 31  ALT 0 - 44 U/L 26 24 27    Erythrocyte Sedimentation Rate     Component Value Date/Time   ESRSEDRATE 22 12/04/2020 0920   ESRSEDRATE 20 10/07/2017 1115    RADIOGRAPHIC STUDIES: I have personally reviewed Kim radiological images as listed and agreed with Kim findings in Kim report. No results found.   ASSESSMENT & PLAN:   86 y.o. Caucasian female with ECOG performance status of 2 with  1) Classical Hodgkin's lymphoma of nodular sclerosis variety At least Stage IIIB some concern for possible Rt lung involvement which makes it Stage IVB Presented with right neck lymph nodes . She had type B constitutional symptoms with 15-20 pound weight loss night sweats or chills .noted to have significant pruritus with mild rash likely from Hodgkin's which remain resolved  Wt Readings from Last 3 Encounters:  12/04/20 135 lb (61.2 kg)  10/17/20 136 lb 6.4 oz (61.9 kg)  05/06/20 137 lb 14.4 oz (62.6 kg)  PET/CT scan after 5 cycles shows marked response to chemotherapy and stable  predominantly [place] 2 as per discussion with Kim radiology.  S/p 5 cycles of AVD with last Cycle of AVD on 05/19/17  2) Microcytic  anemia - likely anemia of chronic disease due to Hodgkin's lymphoma. anemia resolved, Hg stable   PLAN:  -Discussed Lane labwork today, 12/04/20; mild chronic eosinophilia with no major changes, Sed Rate is WNL, Vitamin D is WNL. -No lab or clinical evidence of Hodgkin's Lymphoma recurrence at this time. Will continue watchful observation.  -Lane is now 3 1/2 years out from treatment. No residual toxicities noted.  -Recommend Lane check her blood pressure in Kim morning, before food or drink.  -Continue Prolia q67months  -Will see back in 6 months with labs   3) T10 compression fracture based on imaging done on 05/22/2017 in ED Now status post kyphoplasty with significant improvement in her back pain  4) Osteoporosis PET/CT scan also shows possible L4 compression fracture . These compression fractures appear to be related to osteoporosis . Also had rib fracture related to no significant trauma She was previously on Fosamax that cause generalized body aches . PLAN:  -I explained she should notify us if she is thinking of doing invasive dental procedures while on Prolia.  -Continue with Prolia injection every 6 months -continue vit D replacement as recommended  5) h/o triple-vessel coronary artery disease based on cardiac cath: patient is surprisingly asymptomatic. -Continue management as per PCP and cardiology.  6) . Patient Active Problem List   Diagnosis Date Noted  . Stage 3a chronic kidney disease ([place]) 12/12/2019  . Degenerative disc disease, lumbar- MRI 6/ 2018 08/03/2019  . Neuropathy involving both lower extremities- due to DDD of L-Spine 08/03/2019  . Elevated serum creatinine 08/03/2019  . Prediabetes 06/20/2019  . Hyperlipidemia 06/20/2019  . History of CVA (cerebrovascular accident)- 2017-   05/31/2019  . Syncope and collapse 05/31/2019  . Blistering rash-new onset right palmar surface hand- at base esp btwn fingers 05/31/2019  . Maceration of skin 05/31/2019  . Degeneration  of lumbar intervertebral disc 04/11/2019  . Lumbar radiculopathy 04/11/2019  . Pain of both hip joints 03/17/2019  . Localized, primary osteoarthritis 12/07/2018  . Cramps, muscle, general-mostly bilateral lower extremities, worse at night but not just nocturnal 12/07/2018  . Pain in finger of right hand 11/30/2018  . Sleep difficulties 09/11/2018  . Muscle cramps at night 08/10/2018  . Arthritis 08/10/2018  . Dehydration, mild 08/10/2018  . S/P kyphoplasty- dr Rolena Infante- ortho 04/05/2018  . Postmenopausal 04/05/2018  . Closed fracture of multiple ribs 04/05/2018  . Allergic contact dermatitis due to cosmetics 01/26/2018  . Trigger finger, acquired- R 3rd digit 01/26/2018  . Osteoarthritis of finger 01/26/2018  . Vitamin D deficiency 11/24/2017  . Glucose intolerance (impaired glucose tolerance) 11/24/2017  . Osteoarthritis 09/16/2017  . History of iron deficiency anemia-  by ONC 07/23/2017  . Osteoporosis 07/23/2017  . CAD S/P percutaneous coronary angioplasty 07/02/2017  . Closed compression fracture of L2 lumbar vertebra 06/24/2017  . Abnormal nuclear cardiac imaging test 06/16/2017  . Malnutrition of moderate degree 04/27/2017  . Symptomatic anemia 04/26/2017  . Hodgkin lymphoma ([place]) 04/26/2017  . Port catheter in place 02/02/2017  . Nodular sclerosis Hodgkin lymphoma of lymph nodes of neck ([place]) 01/13/2017  . Dyspnea 09/14/2012  . Epistaxis 06/02/2011  . HYPERCHOLESTEROLEMIA, MIXED 01/08/2009  . Essential hypertension, benign 01/08/2009  . Coronary atherosclerosis- sees Dr. Johnsie Cancel 01/08/2009  . Hypothyroidism 01/08/2009  PLAN  -Continue follow-up with primary care physician for management of other  chronic medical co-morbidities.   FOLLOW UP: RTC with Dr [name] with labs and next dose of Prolia inj in 6 months  Kim total time spent in Kim appt was 20 minutes and more than 50% was on counseling and direct patient cares.  All of Kim patient's questions were answered with  apparent satisfaction. Kim patient knows to call Kim clinic with any problems, questions or concerns.    Sullivan Lone MD [place] AAHIVMS [facility] Hematology/Oncology Physician [facility]  (Office):       [phone] (Work cell):  [phone] (Fax):           [phone]  I, Yevette Edwards, am acting as a scribe for Dr. Sullivan Lone.   .I have reviewed Kim above documentation for accuracy and completeness, and I agree with Kim above. Brunetta Genera MD

## 2020-12-06 ENCOUNTER — Other Ambulatory Visit: Payer: Self-pay

## 2020-12-06 ENCOUNTER — Other Ambulatory Visit: Payer: Medicare PPO

## 2020-12-06 DIAGNOSIS — E039 Hypothyroidism, unspecified: Secondary | ICD-10-CM

## 2020-12-10 LAB — T4, FREE: Free T4: 1.23 ng/dL (ref 0.82–1.77)

## 2020-12-10 LAB — TSH: TSH: 4.58 u[IU]/mL — ABNORMAL HIGH (ref 0.450–4.500)

## 2020-12-10 LAB — T3: T3, Total: 78 ng/dL (ref 71–180)

## 2020-12-16 ENCOUNTER — Ambulatory Visit
Admission: RE | Admit: 2020-12-16 | Discharge: 2020-12-16 | Disposition: A | Discharge status: Home / Self Care | Payer: Medicare PPO | Source: Ambulatory Visit | Attending: Family Medicine | Admitting: Family Medicine

## 2020-12-16 ENCOUNTER — Other Ambulatory Visit: Payer: Self-pay

## 2020-12-16 DIAGNOSIS — Z1231 Encounter for screening mammogram for malignant neoplasm of breast: Secondary | ICD-10-CM | POA: Diagnosis not present

## 2020-12-27 NOTE — Progress Notes (Signed)
Patient is aware of lab results. AS< CMA 

## 2020-12-31 ENCOUNTER — Ambulatory Visit (INDEPENDENT_AMBULATORY_CARE_PROVIDER_SITE_OTHER): Payer: Medicare PPO | Admitting: Otolaryngology

## 2021-01-23 DIAGNOSIS — H5203 Hypermetropia, bilateral: Secondary | ICD-10-CM | POA: Diagnosis not present

## 2021-01-23 DIAGNOSIS — H04123 Dry eye syndrome of bilateral lacrimal glands: Secondary | ICD-10-CM | POA: Diagnosis not present

## 2021-01-23 DIAGNOSIS — H52223 Regular astigmatism, bilateral: Secondary | ICD-10-CM | POA: Diagnosis not present

## 2021-02-15 ENCOUNTER — Other Ambulatory Visit: Payer: Self-pay | Admitting: Physician Assistant

## 2021-02-15 DIAGNOSIS — I10 Essential (primary) hypertension: Secondary | ICD-10-CM

## 2021-02-15 DIAGNOSIS — E039 Hypothyroidism, unspecified: Secondary | ICD-10-CM

## 2021-02-17 ENCOUNTER — Other Ambulatory Visit: Payer: Self-pay

## 2021-02-17 ENCOUNTER — Ambulatory Visit (INDEPENDENT_AMBULATORY_CARE_PROVIDER_SITE_OTHER): Payer: Medicare PPO | Admitting: Physician Assistant

## 2021-02-17 ENCOUNTER — Encounter: Payer: Self-pay | Admitting: Physician Assistant

## 2021-02-17 VITALS — BP 118/77 | HR 74 | Temp 97.4°F | Ht 61.5 in | Wt 136.2 lb

## 2021-02-17 DIAGNOSIS — G4700 Insomnia, unspecified: Secondary | ICD-10-CM

## 2021-02-17 DIAGNOSIS — E039 Hypothyroidism, unspecified: Secondary | ICD-10-CM

## 2021-02-17 DIAGNOSIS — I10 Essential (primary) hypertension: Secondary | ICD-10-CM

## 2021-02-17 DIAGNOSIS — E7800 Pure hypercholesterolemia, unspecified: Secondary | ICD-10-CM | POA: Diagnosis not present

## 2021-02-17 DIAGNOSIS — C8190 Hodgkin lymphoma, unspecified, unspecified site: Secondary | ICD-10-CM | POA: Diagnosis not present

## 2021-02-17 DIAGNOSIS — R202 Paresthesia of skin: Secondary | ICD-10-CM | POA: Diagnosis not present

## 2021-02-17 NOTE — Assessment & Plan Note (Signed)
-  Last thyroid labs: TSH 4.580, free T4 1.23, T3 78 -Will repeat thyroid labs today.  -Continue current medication regimen. Pending lab results will make medication adjustments if indicated.

## 2021-02-17 NOTE — Assessment & Plan Note (Signed)
-  Controlled. -Continue current medication regimen. -Continue low sodium diet and increase water hydration. -Will continue to monitor.

## 2021-02-17 NOTE — Patient Instructions (Signed)

## 2021-02-17 NOTE — Progress Notes (Signed)
Established Patient Office Visit  Subjective:  Patient ID: Kim Lane, female    DOB: Aug 18, 1934  Age: 87 y.o. MRN: [id number]  CC:  Chief Complaint  Patient presents with  . Hypertension  . Hypothyroidism  . Insomnia    HPI Kim Lane presents for follow up on hypertension, hypothyroidism, and insomnia. Has c/o tingling sensation of both feet that occurs at night time and improves after walking for the past few months. Denies recent injury or trauma.   HTN: Pt denies chest pain, palpitations, dizziness or lower extremity swelling. Taking medication as directed without side effects. Has a BP device but has not been checking BP at home. Pt follows a low salt diet. Tries to drink water but states can do better.   HLD: Reports has missed several doses of rosuvastatin because she forgets to take medication at nighttime.   Hypothyroidism: Reports medication compliance. Denies fatigue, palpitations or bowel changes.   Insomnia: Takes melatonin as needed to help with sleep. Does report having a cup of coffee at lunchtime or after.    Past Medical History:  Diagnosis Date  . Anemia   . Anginal pain ([place])    occ  . Arthritis   . Blood dyscrasia 12/2016   hodgins lymphoma tx-remission  . CAD (coronary artery disease) 02/2006   Taxus stent placed to LAD and Diagonal per Dr. Olevia Perches  . Cataracts, bilateral   . Complication of anesthesia   . HTN (hypertension)   . Hypercholesterolemia   . Hypothyroidism   . IBS (irritable bowel syndrome)   . Osteoarthritis   . PONV (postoperative nausea and vomiting)   . Stroke [facility]) 03/2016    Past Surgical History:  Procedure Laterality Date  . APPENDECTOMY    . IR GENERIC HISTORICAL  01/25/2017   IR US GUIDE VASC ACCESS RIGHT 01/25/2017 [name], MD [facility]  . IR GENERIC HISTORICAL  01/25/2017   IR FLUORO GUIDE PORT INSERTION RIGHT 01/25/2017 [name], MD [facility]  . IR REMOVAL TUN ACCESS W/ PORT W/O FL MOD SED   10/28/2017  . KYPHOPLASTY N/A 06/24/2017   Procedure: KYPHOPLASTY T10;  Surgeon: Melina Schools, MD;  Location: [place];  Service: Orthopedics;  Laterality: N/A;  90 mins  . LEFT HEART CATH AND CORONARY ANGIOGRAPHY N/A 06/16/2017   Procedure: Left Heart Cath and Coronary Angiography;  Surgeon: Sherren Mocha, MD;  Location: [place] [facility];  Service: Cardiovascular;  Laterality: N/A;  . MASS EXCISION Right 12/22/2016   Procedure: RIGHT NECK LYMPH NODE BIOPSY;  Surgeon: Rozetta Nunnery, MD;  Location: [place];  Service: ENT;  Laterality: Right;  . SPINE SURGERY    . stents     in heart  . TONSILLECTOMY    . TOTAL KNEE ARTHROPLASTY Bilateral 2011,2012  . VAGINAL HYSTERECTOMY      Family History  Problem Relation Age of Onset  . Diabetes Mother   . Dementia Mother   . ALS Mother   . Heart Problems Father   . Liver disease Sister   . Cirrhosis Sister   . Heart block Other        CABG  . Diabetes Sister   . Heart block Son        CABG  . Other [place]  . Other Daughter        [place]  . Dementia Sister   . Breast cancer Neg Hx  Social History   Socioeconomic History  . Marital status: Widowed    Spouse name: Not on file  . Number of children: 2  . Years of education: 2  . 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 Smoking status: Former Research scientist (life sciences)  . Smokeless tobacco: Never Used  . Tobacco comment: quit smoking 30 years ago, very light smoker  Vaping Use  . Vaping Use: Never used  Substance and Sexual Activity  . Alcohol use: No    Alcohol/week: 0.0 standard drinks  . Drug use: No  . Sexual activity: Not on file  Other Topics Concern  . Not on file  Social History Narrative   08/29/19 Lives alone   caffeine use- coffee- 5 cups daily   Social Determinants of Health   Financial Resource Strain: Not on file  Food  Insecurity: Not on file  Transportation Needs: Not on file  Physical Activity: Not on file  Stress: Not on file  Social Connections: Not on file  Intimate Partner Violence: Not on file    Outpatient Medications Prior to Visit  Medication Sig Dispense Refill  . aspirin 81 MG tablet Take 81 mg by mouth every other day.    . Cholecalciferol (HM VITAMIN D3) 100 MCG (4000 UT) CAPS 4,000 IU QD 30 capsule   . Coenzyme Q10 100 MG capsule Take 100 mg by mouth daily.     . diclofenac Sodium (VOLTAREN) 1 % GEL Apply 2 g topically 4 (four) times daily. To affected joint for pain prn 100 g 0  . Melatonin 2.5 MG CAPS Take 1 capsule by mouth at bedtime.    . Multiple Vitamins-Minerals (CENTRUM SILVER PO) Take 1 tablet by mouth daily.    . nitroGLYCERIN (NITROLINGUAL) 0.4 MG/SPRAY spray Place 1 spray under the tongue every 5 (five) minutes x 3 doses as needed for chest pain (3 sprays max).    . Omega-3 Fatty Acids (FISH OIL) 1000 MG CAPS Take 1 capsule by mouth daily.    [name] Kitchen PROAIR HFA 108 (90 Base) MCG/ACT inhaler Inhale 2 puffs into the lungs every 4 (four) hours as needed for shortness of breath.     . RESTASIS 0.05 % ophthalmic emulsion Place 2 drops into both eyes 2 (two) times daily. For dry eyes    . rosuvastatin (CRESTOR) 10 MG tablet Take 1 tablet (10 mg total) by mouth at bedtime. 90 tablet 3  . triamcinolone cream (KENALOG) 0.1 % Apply 1 application topically 2 (two) times daily. 60 g 0  . carvedilol (COREG) 3.125 MG tablet TAKE 1 TABLET BY MOUTH EVERY OTHER MORNING 45 tablet 0  . levothyroxine (SYNTHROID) 100 MCG tablet Take 1 tablet (100 mcg total) by mouth daily. 90 tablet 0   No facility-administered medications prior to visit.    Allergies  Allergen Reactions  . Fosamax [Alendronate Sodium] Other (See Comments)    "made me ache all over"    ROS Review of Systems Review of Systems:  A fourteen system review of systems was performed and found to be positive as per HPI.  Objective:     Physical Exam General:  Well Developed, well nourished, appropriate for stated age.  Neuro:  Alert and oriented,  extra-ocular muscles intact  HEENT:  Normocephalic, atraumatic, neck supple Skin:  no gross rash, warm, pink. Cardiac:  RRR, S1 S2 wnl's  Respiratory:  ECTA B/L w/o wheezing,  Not using accessory muscles, speaking in full sentences- unlabored. Vascular:  Ext warm, no cyanosis apprec.; no edema, varicosities noted  Psych:  No HI/SI, judgement and insight good, Euthymic mood. Full Affect. . BP 118/77   Pulse 74   Temp (!) 97.4 F (36.3 C)   Ht 5' 1.5" (1.562 m)   Wt 136 lb 3.2 oz (61.8 kg)   SpO2 99%   BMI 25.32 kg/m  Wt Readings from Last 3 Encounters:  02/17/21 136 lb 3.2 oz (61.8 kg)  12/04/20 135 lb (61.2 kg)  10/17/20 136 lb 6.4 oz (61.9 kg)     Health Maintenance Due  Topic Date Due  . COVID-19 Vaccine (3 - Pfizer risk 4-dose series) 03/19/2020  . INFLUENZA VACCINE  07/21/2020    There are no preventive care reminders to display for this patient.  Lab Results  Component Value Date   TSH 4.580 (H) 12/06/2020   Lab Results  Component Value Date   WBC 7.4 12/04/2020   HGB 12.8 12/04/2020   HCT 40.4 12/04/2020   MCV 91.0 12/04/2020   PLT 421 (H) 12/04/2020   Lab Results  Component Value Date   NA 140 12/04/2020   K 4.2 12/04/2020   CHLORIDE 105 10/07/2017   CO2 30 12/04/2020   GLUCOSE 105 (H) 12/04/2020   BUN 21 12/04/2020   CREATININE 1.05 (H) 12/04/2020   BILITOT 0.5 12/04/2020   ALKPHOS 62 12/04/2020   AST 27 12/04/2020   ALT 26 12/04/2020   PROT 7.7 12/04/2020   ALBUMIN 4.0 12/04/2020   CALCIUM 9.6 12/04/2020   ANIONGAP 6 12/04/2020   EGFR >60 10/07/2017   GFR 66.34 09/14/2012   Lab Results  Component Value Date   CHOL 187 10/09/2020   Lab Results  Component Value Date   HDL 63 10/09/2020   Lab Results  Component Value Date   LDLCALC 108 (H) 10/09/2020   Lab Results  Component Value Date   TRIG 91 10/09/2020   Lab  Results  Component Value Date   CHOLHDL 3.0 10/09/2020   Lab Results  Component Value Date   HGBA1C 6.0 (H) 11/13/2019      Assessment & Plan:   Problem List Items Addressed This Visit      Cardiovascular and Mediastinum   Essential hypertension, benign (Chronic)    -Controlled. -Continue current medication regimen. -Continue low sodium diet and increase water hydration. -Will continue to monitor.      Relevant Orders   Comp Met (CMET)     Endocrine   Hypothyroidism - Primary (Chronic)    -Last thyroid labs: TSH 4.580, free T4 1.23, T3 78 -Will repeat thyroid labs today.  -Continue current medication regimen. Pending lab results will make medication adjustments if indicated.      Relevant Orders   TSH   T4, free   T3     Other   HYPERCHOLESTEROLEMIA, MIXED (Chronic)   Hodgkin lymphoma (HCC) (Chronic)    Other Visit Diagnoses    Insomnia, unspecified type       Paresthesia of both feet       Relevant Orders   B12 and Folate Panel   CBC   Iron   Magnesium   HgB A1c   Comp Met (CMET)     Hodgkin lymphoma: -Followed by Oncology, Dr. [name].  Insomnia: -Stable. -Recommend to reduce and limit caffeine use and continue to take melatonin as needed for sleep.  Mixed hypercholesterolemia: -Last lipid panel: total cholesterol 187, triglycerides 91,  HDL 63, LDL 108 (goal <70) -Followed by Cardiology. -Discussed with patient improving medication adherence by setting a reminder to take night time medication. -Recommend to follow a heart healthy diet low in saturated and trans fats.  -Plan to repeat lipid panel and hepatic function with [facility].   Paresthesia of both feet: -Discussed with patient various etiologies, signs and symptoms are suggestive of possible restless leg syndrome. Will place lab orders to evaluate for potential etiologies such as nutritional deficiency, electrolyte imbalance, DM or renal disease. Patient has history of iron deficiency.  -Recommend  to reduce/avoid caffeine use, increase hydration, local massage and brisk walking.  No orders of the defined types were placed in this encounter.   Follow-up: Return for [facility] and [facility] in October/Nov .   Note:  This note was prepared with assistance of Dragon voice recognition software. Occasional wrong-word or sound-a-like substitutions may have occurred due to the inherent limitations of voice recognition software.  Lorrene Reid, PA-C

## 2021-02-18 LAB — COMPREHENSIVE METABOLIC PANEL
ALT: 19 IU/L (ref 0–32)
AST: 26 IU/L (ref 0–40)
Albumin/Globulin Ratio: 2 (ref 1.2–2.2)
Albumin: 4.5 g/dL (ref 3.6–4.6)
Alkaline Phosphatase: 52 IU/L (ref 44–121)
BUN/Creatinine Ratio: 14 (ref 12–28)
BUN: 14 mg/dL (ref 8–27)
Bilirubin Total: 0.4 mg/dL (ref 0.0–1.2)
CO2: 25 mmol/L (ref 20–29)
Calcium: 9.1 mg/dL (ref 8.7–10.3)
Chloride: 102 mmol/L (ref 96–106)
Creatinine, Ser: 1.01 mg/dL — ABNORMAL HIGH (ref 0.57–1.00)
Globulin, Total: 2.3 g/dL (ref 1.5–4.5)
Glucose: 97 mg/dL (ref 65–99)
Potassium: 4.7 mmol/L (ref 3.5–5.2)
Sodium: 139 mmol/L (ref 134–144)
Total Protein: 6.8 g/dL (ref 6.0–8.5)
eGFR: 55 mL/min/{1.73_m2} — ABNORMAL LOW (ref >59)

## 2021-02-18 LAB — IRON: Iron: 106 ug/dL (ref 27–139)

## 2021-02-18 LAB — CBC
Hematocrit: 41.8 % (ref 34.0–46.6)
Hemoglobin: 13.6 g/dL (ref 11.1–15.9)
MCH: 28.8 pg (ref 26.6–33.0)
MCHC: 32.5 g/dL (ref 31.5–35.7)
MCV: 89 fL (ref 79–97)
Platelets: 376 10*3/uL (ref 150–450)
RBC: 4.72 x10E6/uL (ref 3.77–5.28)
RDW: 12.9 % (ref 11.7–15.4)
WBC: 7.1 10*3/uL (ref 3.4–10.8)

## 2021-02-18 LAB — T4, FREE: Free T4: 1.02 ng/dL (ref 0.82–1.77)

## 2021-02-18 LAB — B12 AND FOLATE PANEL
Folate: >20 ng/mL (ref >3.0)
Vitamin B-12: 576 pg/mL (ref 232–1245)

## 2021-02-18 LAB — MAGNESIUM: Magnesium: 2.5 mg/dL — ABNORMAL HIGH (ref 1.6–2.3)

## 2021-02-18 LAB — HEMOGLOBIN A1C
Est. average glucose Bld gHb Est-mCnc: 131 mg/dL
Hgb A1c MFr Bld: 6.2 % — ABNORMAL HIGH (ref 4.8–5.6)

## 2021-02-18 LAB — TSH: TSH: 5.58 u[IU]/mL — ABNORMAL HIGH (ref 0.450–4.500)

## 2021-02-18 LAB — T3: T3, Total: 73 ng/dL (ref 71–180)

## 2021-02-19 MED ORDER — LEVOTHYROXINE SODIUM 112 MCG PO TABS: 112.0000 ug | ORAL_TABLET | Freq: Every day | ORAL | 0 refills | Status: DC

## 2021-02-19 NOTE — Addendum Note (Signed)
Addended by: Mickel Crow on: 02/19/2021 09:42 AM   Modules accepted: Orders

## 2021-03-28 ENCOUNTER — Other Ambulatory Visit: Payer: Self-pay | Admitting: Physician Assistant

## 2021-03-28 DIAGNOSIS — E039 Hypothyroidism, unspecified: Secondary | ICD-10-CM

## 2021-03-28 DIAGNOSIS — Z0000 Encounter for general adult medical examination without abnormal findings: Secondary | ICD-10-CM

## 2021-03-31 ENCOUNTER — Other Ambulatory Visit: Payer: Self-pay | Admitting: Physician Assistant

## 2021-03-31 DIAGNOSIS — Z0000 Encounter for general adult medical examination without abnormal findings: Secondary | ICD-10-CM

## 2021-03-31 DIAGNOSIS — E039 Hypothyroidism, unspecified: Secondary | ICD-10-CM

## 2021-03-31 DIAGNOSIS — H903 Sensorineural hearing loss, bilateral: Secondary | ICD-10-CM | POA: Diagnosis not present

## 2021-04-02 ENCOUNTER — Other Ambulatory Visit: Payer: Self-pay

## 2021-04-02 ENCOUNTER — Other Ambulatory Visit: Payer: Medicare PPO

## 2021-04-02 DIAGNOSIS — E039 Hypothyroidism, unspecified: Secondary | ICD-10-CM | POA: Diagnosis not present

## 2021-04-02 DIAGNOSIS — Z0000 Encounter for general adult medical examination without abnormal findings: Secondary | ICD-10-CM | POA: Diagnosis not present

## 2021-04-03 LAB — T4, FREE: Free T4: 1.1 ng/dL (ref 0.82–1.77)

## 2021-04-03 LAB — TSH: TSH: 3.95 u[IU]/mL (ref 0.450–4.500)

## 2021-04-03 LAB — MAGNESIUM: Magnesium: 2.3 mg/dL (ref 1.6–2.3)

## 2021-04-03 LAB — T3: T3, Total: 84 ng/dL (ref 71–180)

## 2021-05-02 ENCOUNTER — Telehealth: Payer: Self-pay | Admitting: Physician Assistant

## 2021-05-02 NOTE — Telephone Encounter (Signed)
Called patient to advise that her parking document is ready for pick up- she will pick up on 05/08/21. Thank you

## 2021-05-24 ENCOUNTER — Other Ambulatory Visit: Payer: Self-pay | Admitting: Physician Assistant

## 2021-05-24 DIAGNOSIS — I10 Essential (primary) hypertension: Secondary | ICD-10-CM

## 2021-06-04 ENCOUNTER — Ambulatory Visit: Payer: Medicare PPO

## 2021-06-04 ENCOUNTER — Other Ambulatory Visit: Payer: Medicare PPO

## 2021-06-04 ENCOUNTER — Ambulatory Visit: Payer: Medicare PPO | Admitting: Hematology

## 2021-06-09 ENCOUNTER — Other Ambulatory Visit: Payer: Self-pay | Admitting: Physician Assistant

## 2021-06-09 DIAGNOSIS — E039 Hypothyroidism, unspecified: Secondary | ICD-10-CM

## 2021-06-09 NOTE — Telephone Encounter (Signed)
Please reach out to patient to schedule [facility] per last AVS.

## 2021-06-24 NOTE — Progress Notes (Signed)
Patient ID: Kim Lane, female   DOB: [birth year], 87 y.o.   MRN: [id number]   87 y.o. f/u for CAD had high risk myovue 06/10/17 for preoperative back surgery EF 58% Ischemia in the apical and anterior regions F/u cath done by Dr Burt Knack reviewed   Conclusion   1. Severe three-vessel coronary artery disease with: Severe mid LAD and first diagonal stenosis with continued patency of the stented segment in the mid LAD Severe ostial left circumflex and first OM stenoses Severe proximal RCA and posterior AV segment stenoses   2. Normal LV function by nuclear assessment     She was allowed to have her back surgery with Dr Rolena Infante and had no cardiac complications and good pain relief  Follows with Oncology for Hodgkin's Lymphoma stable at this point   Daughter has throat cancer only age 59 has had surgery/chemo/XRT feeding tube out and gaining weight  This has been hard on her   TSH 04/02/21 mildly elevated 5.6 normal T4  A1c 6.2   Having some restless leg syndrome No angina   ROS: Denies fever, malais, weight loss, blurry vision, decreased visual acuity, cough, sputum, SOB, hemoptysis, pleuritic pain, palpitaitons, heartburn, abdominal pain, melena, lower extremity edema, claudication, or rash.  All other systems reviewed and negative  General: BP 128/60   Pulse (!) 59   Ht 5\' 1"  (1.549 m)   Wt 59.7 kg   SpO2 98%   BMI 24.87 kg/m  Affect appropriate Elderly white female  HEENT: right neck lymph nodes  Neck supple with no adenopathy JVP normal no bruits no thyromegaly Lungs clear with no wheezing and good diaphragmatic motion Heart:  S1/S2 SEM  murmur, no rub, gallop or click PMI normal Abdomen: benighn, BS positve, no tenderness, no AAA post appy  no bruit.  No HSM or HJR Distal pulses intact with no bruits LE telangiectasia  Neuro non-focal Skin warm and dry Post bilateral TKR Varicosities bilateral   Current Outpatient Medications  Medication Sig Dispense Refill    aspirin 81 MG tablet Take 81 mg by mouth every other day.     carvedilol (COREG) 3.125 MG tablet TAKE 1 TABLET BY MOUTH EVERY OTHER MORNING 45 tablet 0   Cholecalciferol (HM VITAMIN D3) 100 MCG (4000 UT) CAPS 4,000 IU QD 30 capsule    Coenzyme Q10 100 MG capsule Take 100 mg by mouth daily.      diclofenac Sodium (VOLTAREN) 1 % GEL Apply 2 g topically 4 (four) times daily. To affected joint for pain prn 100 g 0   levothyroxine (SYNTHROID) 112 MCG tablet TAKE 1 TABLET BY MOUTH DAILY 90 tablet 0   Melatonin 2.5 MG CAPS Take 1 capsule by mouth at bedtime.     Multiple Vitamins-Minerals (CENTRUM SILVER PO) Take 1 tablet by mouth daily.     nitroGLYCERIN (NITROLINGUAL) 0.4 MG/SPRAY spray Place 1 spray under the tongue every 5 (five) minutes x 3 doses as needed for chest pain (3 sprays max).     Omega-3 Fatty Acids (FISH OIL) 1000 MG CAPS Take 1 capsule by mouth daily.     PROAIR HFA 108 (90 Base) MCG/ACT inhaler Inhale 2 puffs into the lungs every 4 (four) hours as needed for shortness of breath.      RESTASIS 0.05 % ophthalmic emulsion Place 2 drops into both eyes 2 (two) times daily. For dry eyes     triamcinolone cream (KENALOG) 0.1 % Apply 1 application topically 2 (two) times daily. 60 g 0  rosuvastatin (CRESTOR) 10 MG tablet Take 1 tablet (10 mg total) by mouth at bedtime. (Patient not taking: Reported on 07/02/2021) 90 tablet 3   No current facility-administered medications for this visit.    Allergies  Fosamax [alendronate sodium]  Electrocardiogram:  04/03/20 SR rate 63 normal 07/02/2021 SR rate 59 normal   Assessment and Plan  CAD: Severe 3VD no angina Continue medical Rx and consider intervention only for unstable agnina give age and co morbidities  Continue beta blocker and ASA   Thyroid  Continue replacement TSH normal labs with primary   TIA:  Non focal neuro exam for me.  Echo with bubble study negative  ECG totally normal Monitor unrevealing and patient defers LINQ impant at  this time  Lymphoma:  Post 5 cycles AVD finished 05/19/17 on Prolia f/u with Dr [name]   Back:  Post kyphoplasty with good pain relief. Compression fractures ? Due to osteopenia did not tolerate fosamax in past f/u primary   Claudication:  Leg pain not vascular claudication normal ABI's 03/21/19 Restless leg syndrome f/u primary   HLD:  not taking crestor new script called in f/u labs in 3 months    Lipid/Liver F/U in a year      [employer]

## 2021-06-26 NOTE — Progress Notes (Signed)
HEMATOLOGY/ONCOLOGY CLINIC NOTE  Date of Service: 06/27/21  Patient Care Team: Lorrene Reid, PA-C as PCP - General Josue Hector, MD as PCP - Cardiology (Cardiology) Josue Hector, MD as Consulting Physician (Cardiology) Penni Bombard, MD as Consulting Physician (Neurology) Jarome Matin, MD as Consulting Physician (Dermatology) Rozetta Nunnery, MD as Consulting Physician (Otolaryngology) Brunetta Genera, MD as Consulting Physician (Hematology) Melina Schools, MD as Consulting Physician (Orthopedic Surgery) Renette Butters, MD as Attending Physician (Orthopedic Surgery)  CHIEF COMPLAINTS/PURPOSE OF CONSULTATION:   f/u for continued treatment of Hodgkin's lymphoma  HISTORY OF PRESENTING ILLNESS:   plz see previous   INTERVAL HISTORY: Kim Lane is here for scheduled follow up for her Hodgkins lymphoma. The patient's last visit with [place] was on 12/04/2020. The pt reports that she is doing well overall.  The pt reports that she has been well over the last six months. She has not been sleeping well lately, having a very difficult time falling asleep. She is going to start taking melatonin to see if this helps her. She continues to take her Vitamin D twice weekly.   Lab results today 06/27/2021 of CBC w/diff and CMP is as follows: all values are WNL except for Eosinophils Abs of 0.9K, Glucose of 103, GFR est of 56. 06/27/2021 Vit D 25 Hydroxy 53.38  On review of systems, pt reports difficulty sleeping and denies decreased appetite, sudden weight loss, acute skin rashes, new lumps/bumps, night sweats, fever, chill, and any other symptoms.   MEDICAL HISTORY:  Past Medical History:  Diagnosis Date   Anemia    Anginal pain ([place])    occ   Arthritis    Blood dyscrasia 12/2016   hodgins lymphoma tx-remission   CAD (coronary artery disease) 02/2006   Taxus stent placed to LAD and Diagonal per Dr. Olevia Perches   Cataracts, bilateral    Complication of  anesthesia    HTN (hypertension)    Hypercholesterolemia    Hypothyroidism    IBS (irritable bowel syndrome)    Osteoarthritis    PONV (postoperative nausea and vomiting)    Stroke ([place]) 03/2016  Osteoporosis Cardiologist Dr. [name] -Dr. Earlean Shawl  SURGICAL HISTORY: Past Surgical History:  Procedure Laterality Date   APPENDECTOMY     IR GENERIC HISTORICAL  01/25/2017   IR US GUIDE VASC ACCESS RIGHT 01/25/2017 [name], MD [facility]   IR GENERIC HISTORICAL  01/25/2017   IR FLUORO GUIDE PORT INSERTION RIGHT 01/25/2017 [name], MD [facility]   IR REMOVAL TUN ACCESS W/ PORT W/O FL MOD SED  10/28/2017   KYPHOPLASTY N/A 06/24/2017   Procedure: KYPHOPLASTY T10;  Surgeon: Melina Schools, MD;  Location: [place];  Service: Orthopedics;  Laterality: N/A;  90 mins   LEFT HEART CATH AND CORONARY ANGIOGRAPHY N/A 06/16/2017   Procedure: Left Heart Cath and Coronary Angiography;  Surgeon: Sherren Mocha, MD;  Location: [facility];  Service: Cardiovascular;  Laterality: N/A;   MASS EXCISION Right 12/22/2016   Procedure: RIGHT NECK LYMPH NODE BIOPSY;  Surgeon: Rozetta Nunnery, MD;  Location: [place] [facility];  Service: ENT;  Laterality: Right;   SPINE SURGERY     stents     in heart   TONSILLECTOMY     TOTAL KNEE ARTHROPLASTY Bilateral 2011,2012   VAGINAL HYSTERECTOMY      SOCIAL HISTORY: Social History   Socioeconomic History   Marital status: Widowed    Spouse name: Not on  file   Number of children: 2   Years of education: 12   Highest education level: Not on file  Occupational History    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, retired  Tobacco Use   Smoking status: Former    Pack years: 0.00   Smokeless tobacco: Never   Tobacco comments:    quit smoking 30 years ago, very light smoker  Vaping Use   Vaping Use: Never used  Substance and Sexual Activity   Alcohol use: No    Alcohol/week: 0.0 standard drinks   Drug use: No    Sexual activity: Not on file  Other Topics Concern   Not on file  Social History Narrative   08/29/19 Lives alone   caffeine use- coffee- 5 cups daily   Social Determinants of Health   Financial Resource Strain: Not on file  Food Insecurity: Not on file  Transportation Needs: Not on file  Physical Activity: Not on file  Stress: Not on file  Social Connections: Not on file  Intimate Partner Violence: Not on file    FAMILY HISTORY: Family History  Problem Relation Age of Onset   Diabetes Mother    Dementia Mother    ALS Mother    Heart Problems Father    Liver disease Sister    Cirrhosis Sister    Heart block Other        CABG   Diabetes Sister    Heart block Son        CABG   Other Brother        GOOD HEALTH   Other Daughter        GOOD HEALTH   Dementia Sister    Breast cancer Neg Hx     ALLERGIES:  is allergic to fosamax [alendronate sodium].  MEDICATIONS:  Current Outpatient Medications  Medication Sig Dispense Refill   aspirin 81 MG tablet Take 81 mg by mouth every other day.     carvedilol (COREG) 3.125 MG tablet TAKE 1 TABLET BY MOUTH EVERY OTHER MORNING 45 tablet 0   Cholecalciferol (HM VITAMIN D3) 100 MCG (4000 UT) CAPS 4,000 IU QD (Patient taking differently: 4,000 IU 3 times a week) 30 capsule    Coenzyme Q10 100 MG capsule Take 100 mg by mouth daily.      diclofenac Sodium (VOLTAREN) 1 % GEL Apply 2 g topically 4 (four) times daily. To affected joint for pain prn 100 g 0   levothyroxine (SYNTHROID) 112 MCG tablet TAKE 1 TABLET BY MOUTH DAILY 90 tablet 0   Melatonin 2.5 MG CAPS Take 1 capsule by mouth at bedtime.     Multiple Vitamins-Minerals (CENTRUM SILVER PO) Take 1 tablet by mouth daily.     nitroGLYCERIN (NITROLINGUAL) 0.4 MG/SPRAY spray Place 1 spray under the tongue every 5 (five) minutes x 3 doses as needed for chest pain (3 sprays max).     Omega-3 Fatty Acids (FISH OIL) 1000 MG CAPS Take 1 capsule by mouth daily.     PROAIR HFA 108 (90 Base)  MCG/ACT inhaler Inhale 2 puffs into the lungs every 4 (four) hours as needed for shortness of breath.      RESTASIS 0.05 % ophthalmic emulsion Place 2 drops into both eyes 2 (two) times daily. For dry eyes     rosuvastatin (CRESTOR) 10 MG tablet Take 1 tablet (10 mg total) by mouth at bedtime. 90 tablet 3   triamcinolone cream (KENALOG) 0.1 % Apply 1 application topically 2 (  two) times daily. 60 g 0   No current facility-administered medications for this visit.    REVIEW OF SYSTEMS:  10 Point review of Systems was done is negative except as noted above.  PHYSICAL EXAMINATION:  ECOG PERFORMANCE STATUS: 2 - Symptomatic, <50% confined to bed  Vitals:   06/27/21 1104  BP: 135/65  Pulse: 72  Resp: 18  Temp: 98.9 F (37.2 C)  TempSrc: Oral  SpO2: 98%  Weight: 130 lb 14.4 oz (59.4 kg)   . Exam was given in a chair.  GENERAL:alert, in no acute distress and comfortable SKIN: no acute rashes, no significant lesions EYES: conjunctiva are pink and non-injected, sclera anicteric OROPHARYNX: MMM, no exudates, no oropharyngeal erythema or ulceration NECK: supple, no JVD LYMPH:  no palpable lymphadenopathy in the cervical, axillary or inguinal regions LUNGS: clear to auscultation b/l with normal respiratory effort HEART: regular rate & rhythm ABDOMEN:  normoactive bowel sounds , non tender, not distended. No palpable hepatosplenomegaly.  Extremity: no pedal edema PSYCH: alert & oriented x 3 with fluent speech NEURO: no focal motor/sensory deficits  LABORATORY DATA:  I have reviewed the data as listed   . CBC Latest Ref Rng & Units 06/27/2021 02/17/2021 12/04/2020  WBC 4.0 - 10.5 K/uL 7.4 7.1 7.4  Hemoglobin 12.0 - 15.0 g/dL 12.7 13.6 12.8  Hematocrit 36.0 - 46.0 % 38.3 41.8 40.4  Platelets 150 - 400 K/uL 337 376 421(H)   . CMP Latest Ref Rng & Units 06/27/2021 02/17/2021 12/04/2020  Glucose 70 - 99 mg/dL 103(H) 97 105(H)  BUN 8 - 23 mg/dL 13 14 21   Creatinine 0.44 - 1.00 mg/dL 0.99  1.01(H) 1.05(H)  Sodium 135 - 145 mmol/L 140 139 140  Potassium 3.5 - 5.1 mmol/L 4.7 4.7 4.2  Chloride 98 - 111 mmol/L 103 102 104  CO2 22 - 32 mmol/L 30 25 30   Calcium 8.9 - 10.3 mg/dL 9.3 9.1 9.6  Total Protein 6.5 - 8.1 g/dL 7.0 6.8 7.7  Total Bilirubin 0.3 - 1.2 mg/dL 0.5 0.4 0.5  Alkaline Phos 38 - 126 U/L 54 52 62  AST 15 - 41 U/L 23 26 27   ALT 0 - 44 U/L 20 19 26    Erythrocyte Sedimentation Rate     Component Value Date/Time   ESRSEDRATE 22 12/04/2020 0920   ESRSEDRATE 20 10/07/2017 1115    RADIOGRAPHIC STUDIES: I have personally reviewed the radiological images as listed and agreed with the findings in the report. No results found.   ASSESSMENT & PLAN:   87 y.o. Caucasian female with ECOG performance status of 2 with  1) Classical Hodgkin's lymphoma of nodular sclerosis variety At least Stage IIIB some concern for possible Rt lung involvement which makes it Stage IVB Presented with right neck lymph nodes . She had type B constitutional symptoms with 15-20 pound weight loss night sweats or chills .noted to have significant pruritus with mild rash likely from Hodgkin's which remain resolved  Wt Readings from Last 3 Encounters:  06/27/21 130 lb 14.4 oz (59.4 kg)  02/17/21 136 lb 3.2 oz (61.8 kg)  12/04/20 135 lb (61.2 kg)  PET/CT scan after 5 cycles shows marked response to chemotherapy and stable predominantly [place] 2 as per discussion with the radiology.  S/p 5 cycles of AVD with last Cycle of AVD on 05/19/17  2) Microcytic anemia - likely anemia of chronic disease due to Hodgkin's lymphoma. anemia resolved, Hg stable   PLAN:  -Discussed pt labwork today, 06/27/2021; counts and  chemistries stable. -Recommended pt contact PCP for dermatology referral if skin spots get worse or bothersome. -No lab or clinical evidence of Hodgkin's Lymphoma recurrence at this time. Will continue watchful observation.  -Pt is now over 4 years out from treatment. No residual  toxicities noted.  -Recommend pt check her blood pressure in the morning, before food or drink.  -Advised pt her insurance made [place] switch her Prolia to Zometa. She will be getting this today. -Will see back in 6 months with labs.   3) T10 compression fracture based on imaging done on 05/22/2017 in ED Now status post kyphoplasty with significant improvement in her back pain  4) Osteoporosis PET/CT scan also shows possible L4 compression fracture . These compression fractures appear to be related to osteoporosis . Also had rib fracture related to no significant trauma She was previously on Fosamax that cause generalized body aches . PLAN:  -I explained she should notify us if she is thinking of doing invasive dental procedures while on Zometa.  -Starting Zometa q24weeks. -continue vit D replacement as recommended  5) h/o triple-vessel coronary artery disease based on cardiac cath: patient is surprisingly asymptomatic. -Continue management as per PCP and cardiology.  6) . Patient Active Problem List   Diagnosis Date Noted   Stage 3a chronic kidney disease ([place]) 12/12/2019   Degenerative disc disease, lumbar- MRI 6/ 2018 08/03/2019   Neuropathy involving both lower extremities- due to DDD of L-Spine 08/03/2019   Elevated serum creatinine 08/03/2019   Prediabetes 06/20/2019   Hyperlipidemia 06/20/2019   History of CVA (cerebrovascular accident)- 2017-   05/31/2019   Syncope and collapse 05/31/2019   Blistering rash-new onset right palmar surface hand- at base esp btwn fingers 05/31/2019   Maceration of skin 05/31/2019   Degeneration of lumbar intervertebral disc 04/11/2019   Lumbar radiculopathy 04/11/2019   Pain of both hip joints 03/17/2019   Localized, primary osteoarthritis 12/07/2018   Cramps, muscle, general-mostly bilateral lower extremities, worse at night but not just nocturnal 12/07/2018   Pain in finger of right hand 11/30/2018   Sleep difficulties 09/11/2018   Muscle  cramps at night 08/10/2018   Arthritis 08/10/2018   Dehydration, mild 08/10/2018   S/P kyphoplasty- dr brooks- ortho 04/05/2018   Postmenopausal 04/05/2018   Closed fracture of multiple ribs 04/05/2018   Allergic contact dermatitis due to cosmetics 01/26/2018   Trigger finger, acquired- R 3rd digit 01/26/2018   Osteoarthritis of finger 01/26/2018   Vitamin D deficiency 11/24/2017   Glucose intolerance (impaired glucose tolerance) 11/24/2017   Osteoarthritis 09/16/2017   History of iron deficiency anemia-  by ONC 07/23/2017   Osteoporosis 07/23/2017   CAD S/P percutaneous coronary angioplasty 07/02/2017   Closed compression fracture of L2 lumbar vertebra 06/24/2017   Abnormal nuclear cardiac imaging test 06/16/2017   Malnutrition of moderate degree 04/27/2017   Symptomatic anemia 04/26/2017   Hodgkin lymphoma ([place]) 04/26/2017   Port catheter in place 02/02/2017   Nodular sclerosis Hodgkin lymphoma of lymph nodes of neck ([place]) 01/13/2017   Dyspnea 09/14/2012   Epistaxis 06/02/2011   HYPERCHOLESTEROLEMIA, MIXED 01/08/2009   Essential hypertension, benign 01/08/2009   Coronary atherosclerosis- sees Dr. Johnsie Cancel 01/08/2009   Hypothyroidism 01/08/2009  PLAN  -Continue follow-up with primary care physician for management of other chronic medical co-morbidities.   FOLLOW UP: RTC with Dr [name] with labs and next dose of Zometa in 6 months  The total time spent in the appt was 25 minutes and more than 50% was on counseling  and direct patient cares.  All of the patient's questions were answered with apparent satisfaction. The patient knows to call the clinic with any problems, questions or concerns.    Sullivan Lone MD [place] [facility] Hematology/Oncology Physician [facility]  (Office):       [phone] (Work cell):  [phone] (Fax):           [phone]  I, Reinaldo Raddle, am acting as scribe for Dr. Sullivan Lone, MD.   .I have reviewed the above documentation for  accuracy and completeness, and I agree with the above. Brunetta Genera MD

## 2021-06-26 NOTE — Progress Notes (Signed)
Patients insurance no longer covers Prolia, required change to: Zometa 4 mg IVPB q 24 weeks.  Noted intolerance to oral Fosamax (makes her ache all over), will monitor Zometa for any noted side effects.  Supportive plan entered as above.  T.O. Dr Cleda Clarks, PharmD

## 2021-06-27 ENCOUNTER — Inpatient Hospital Stay: Payer: Medicare PPO | Attending: Hematology

## 2021-06-27 ENCOUNTER — Inpatient Hospital Stay (HOSPITAL_BASED_OUTPATIENT_CLINIC_OR_DEPARTMENT_OTHER): Payer: Medicare PPO | Admitting: Hematology

## 2021-06-27 ENCOUNTER — Inpatient Hospital Stay: Payer: Medicare PPO

## 2021-06-27 ENCOUNTER — Other Ambulatory Visit: Payer: Self-pay

## 2021-06-27 VITALS — BP 135/65 | HR 72 | Temp 98.9°F | Resp 18 | Wt 130.9 lb

## 2021-06-27 DIAGNOSIS — M810 Age-related osteoporosis without current pathological fracture: Secondary | ICD-10-CM | POA: Diagnosis not present

## 2021-06-27 DIAGNOSIS — Z79899 Other long term (current) drug therapy: Secondary | ICD-10-CM | POA: Insufficient documentation

## 2021-06-27 DIAGNOSIS — C8190 Hodgkin lymphoma, unspecified, unspecified site: Secondary | ICD-10-CM | POA: Diagnosis not present

## 2021-06-27 DIAGNOSIS — C8111 Nodular sclerosis classical Hodgkin lymphoma, lymph nodes of head, face, and neck: Secondary | ICD-10-CM | POA: Insufficient documentation

## 2021-06-27 DIAGNOSIS — Z8249 Family history of ischemic heart disease and other diseases of the circulatory system: Secondary | ICD-10-CM | POA: Insufficient documentation

## 2021-06-27 DIAGNOSIS — Z833 Family history of diabetes mellitus: Secondary | ICD-10-CM | POA: Diagnosis not present

## 2021-06-27 DIAGNOSIS — N1831 Chronic kidney disease, stage 3a: Secondary | ICD-10-CM | POA: Diagnosis not present

## 2021-06-27 DIAGNOSIS — M8000XG Age-related osteoporosis with current pathological fracture, unspecified site, subsequent encounter for fracture with delayed healing: Secondary | ICD-10-CM

## 2021-06-27 DIAGNOSIS — D509 Iron deficiency anemia, unspecified: Secondary | ICD-10-CM | POA: Diagnosis not present

## 2021-06-27 DIAGNOSIS — Z8379 Family history of other diseases of the digestive system: Secondary | ICD-10-CM | POA: Diagnosis not present

## 2021-06-27 DIAGNOSIS — Z818 Family history of other mental and behavioral disorders: Secondary | ICD-10-CM | POA: Insufficient documentation

## 2021-06-27 DIAGNOSIS — Z95828 Presence of other vascular implants and grafts: Secondary | ICD-10-CM

## 2021-06-27 LAB — CBC WITH DIFFERENTIAL/PLATELET
Abs Immature Granulocytes: 0.03 10*3/uL (ref 0.00–0.07)
Basophils Absolute: 0.1 10*3/uL (ref 0.0–0.1)
Basophils Relative: 2 %
Eosinophils Absolute: 0.9 10*3/uL — ABNORMAL HIGH (ref 0.0–0.5)
Eosinophils Relative: 12 %
HCT: 38.3 % (ref 36.0–46.0)
Hemoglobin: 12.7 g/dL (ref 12.0–15.0)
Immature Granulocytes: 0 %
Lymphocytes Relative: 19 %
Lymphs Abs: 1.4 10*3/uL (ref 0.7–4.0)
MCH: 29.7 pg (ref 26.0–34.0)
MCHC: 33.2 g/dL (ref 30.0–36.0)
MCV: 89.7 fL (ref 80.0–100.0)
Monocytes Absolute: 0.6 10*3/uL (ref 0.1–1.0)
Monocytes Relative: 8 %
Neutro Abs: 4.4 10*3/uL (ref 1.7–7.7)
Neutrophils Relative %: 59 %
Platelets: 337 10*3/uL (ref 150–400)
RBC: 4.27 MIL/uL (ref 3.87–5.11)
RDW: 13.6 % (ref 11.5–15.5)
WBC: 7.4 10*3/uL (ref 4.0–10.5)
nRBC: 0 % (ref 0.0–0.2)

## 2021-06-27 LAB — CMP (CANCER CENTER ONLY)
ALT: 20 U/L (ref 0–44)
AST: 23 U/L (ref 15–41)
Albumin: 3.7 g/dL (ref 3.5–5.0)
Alkaline Phosphatase: 54 U/L (ref 38–126)
Anion gap: 7 (ref 5–15)
BUN: 13 mg/dL (ref 8–23)
CO2: 30 mmol/L (ref 22–32)
Calcium: 9.3 mg/dL (ref 8.9–10.3)
Chloride: 103 mmol/L (ref 98–111)
Creatinine: 0.99 mg/dL (ref 0.44–1.00)
GFR, Estimated: 56 mL/min — ABNORMAL LOW (ref >60)
Glucose, Bld: 103 mg/dL — ABNORMAL HIGH (ref 70–99)
Potassium: 4.7 mmol/L (ref 3.5–5.1)
Sodium: 140 mmol/L (ref 135–145)
Total Bilirubin: 0.5 mg/dL (ref 0.3–1.2)
Total Protein: 7 g/dL (ref 6.5–8.1)

## 2021-06-27 LAB — VITAMIN D 25 HYDROXY (VIT D DEFICIENCY, FRACTURES): Vit D, 25-Hydroxy: 53.38 ng/mL (ref 30–100)

## 2021-06-27 MED ORDER — ZOLEDRONIC ACID 4 MG/100ML IV SOLN
4.0000 mg | Freq: Once | INTRAVENOUS | Status: AC
  Administered 2021-06-27: 4 mg via INTRAVENOUS

## 2021-06-27 MED ORDER — SODIUM CHLORIDE 0.9 % IV SOLN
Freq: Once | INTRAVENOUS | Status: AC
  Filled 2021-06-27: qty 250

## 2021-06-27 MED ORDER — ZOLEDRONIC ACID 4 MG/100ML IV SOLN
INTRAVENOUS | Status: AC
  Filled 2021-06-27: qty 100

## 2021-06-27 NOTE — Patient Instructions (Signed)

## 2021-06-30 ENCOUNTER — Telehealth: Payer: Self-pay

## 2021-06-30 NOTE — Telephone Encounter (Signed)
Mrs. Heffner called because she read her after visit summary explaining the risks and benefits of receiving zoledronic acid (Zometa).  She received infusion on 06/27/2021 of Zometa and is scheduled to have a tooth extracted on 07/30/2021.  Advised her that we normally get the dentist to clear them before zometa is administered and that she should call her dentist to let them know she had Zometa on 7/8.  It will be up to her dentist whether they want to push out her extraction.  She understands and will call the dentist. Gardiner Rhyme, RN

## 2021-07-02 ENCOUNTER — Encounter: Payer: Self-pay | Admitting: Cardiovascular Disease

## 2021-07-02 ENCOUNTER — Ambulatory Visit (INDEPENDENT_AMBULATORY_CARE_PROVIDER_SITE_OTHER): Payer: Medicare PPO | Admitting: Cardiovascular Disease

## 2021-07-02 ENCOUNTER — Telehealth: Payer: Self-pay | Admitting: Hematology

## 2021-07-02 ENCOUNTER — Other Ambulatory Visit: Payer: Self-pay

## 2021-07-02 DIAGNOSIS — I2510 Atherosclerotic heart disease of native coronary artery without angina pectoris: Secondary | ICD-10-CM | POA: Diagnosis not present

## 2021-07-02 DIAGNOSIS — Z8673 Personal history of transient ischemic attack (TIA), and cerebral infarction without residual deficits: Secondary | ICD-10-CM | POA: Diagnosis not present

## 2021-07-02 DIAGNOSIS — Z9861 Coronary angioplasty status: Secondary | ICD-10-CM | POA: Diagnosis not present

## 2021-07-02 MED ORDER — ROSUVASTATIN CALCIUM 10 MG PO TABS: 10.0000 mg | ORAL_TABLET | Freq: Every day | ORAL | 3 refills | Status: DC

## 2021-07-02 NOTE — Patient Instructions (Signed)
Medication Instructions:  *If you need a refill on your cardiac medications before your next appointment, please call your pharmacy*  Lab Work: Your physician recommends that you return for lab work in: 3 months for fasting lipid and liver panel.  If you have labs (blood work) drawn today and your tests are completely normal, you will receive your results only by: [place] (if you have MyChart) OR A paper copy in the mail If you have any lab test that is abnormal or we need to change your treatment, we will call you to review the results.  Testing/Procedures: None ordered today.  Follow-Up: At [facility], you and your health needs are our priority.  As part of our continuing mission to provide you with exceptional heart care, we have created designated Provider Care Teams.  These Care Teams include your primary Cardiologist (physician) and Advanced Practice Providers (APPs -  Physician Assistants and Nurse Practitioners) who all work together to provide you with the care you need, when you need it.  We recommend signing up for the patient portal called "MyChart".  Sign up information is provided on this After Visit Summary.  MyChart is used to connect with patients for Virtual Visits (Telemedicine).  Patients are able to view lab/test results, encounter notes, upcoming appointments, etc.  Non-urgent messages can be sent to your provider as well.   To learn more about what you can do with MyChart, go to [email or web address].    Your next appointment:   12 month(s)  The format for your next appointment:   In Person  Provider:   You may see [name], MD or one of the following Advanced Practice Providers on your designated Care Team:   Cecilie Kicks, NP

## 2021-07-02 NOTE — Telephone Encounter (Signed)
Left message with follow-up appointment per 7/8 los.

## 2021-07-03 ENCOUNTER — Encounter: Payer: Self-pay | Admitting: Hematology

## 2021-07-23 DIAGNOSIS — Z8249 Family history of ischemic heart disease and other diseases of the circulatory system: Secondary | ICD-10-CM | POA: Diagnosis not present

## 2021-07-23 DIAGNOSIS — C8590 Non-Hodgkin lymphoma, unspecified, unspecified site: Secondary | ICD-10-CM | POA: Diagnosis not present

## 2021-07-23 DIAGNOSIS — Z833 Family history of diabetes mellitus: Secondary | ICD-10-CM | POA: Diagnosis not present

## 2021-07-23 DIAGNOSIS — E039 Hypothyroidism, unspecified: Secondary | ICD-10-CM | POA: Diagnosis not present

## 2021-08-05 DIAGNOSIS — H5203 Hypermetropia, bilateral: Secondary | ICD-10-CM | POA: Diagnosis not present

## 2021-08-05 DIAGNOSIS — H52223 Regular astigmatism, bilateral: Secondary | ICD-10-CM | POA: Diagnosis not present

## 2021-08-05 DIAGNOSIS — H524 Presbyopia: Secondary | ICD-10-CM | POA: Diagnosis not present

## 2021-08-05 DIAGNOSIS — H04123 Dry eye syndrome of bilateral lacrimal glands: Secondary | ICD-10-CM | POA: Diagnosis not present

## 2021-08-11 ENCOUNTER — Telehealth: Payer: Self-pay | Admitting: Cardiovascular Disease

## 2021-08-11 ENCOUNTER — Encounter: Payer: Self-pay | Admitting: Physician Assistant

## 2021-08-11 ENCOUNTER — Ambulatory Visit (INDEPENDENT_AMBULATORY_CARE_PROVIDER_SITE_OTHER): Payer: Medicare PPO | Admitting: Physician Assistant

## 2021-08-11 ENCOUNTER — Other Ambulatory Visit: Payer: Self-pay

## 2021-08-11 VITALS — BP 132/84 | HR 76 | Temp 98.6°F | Ht 61.0 in | Wt 129.0 lb

## 2021-08-11 DIAGNOSIS — I2510 Atherosclerotic heart disease of native coronary artery without angina pectoris: Secondary | ICD-10-CM | POA: Diagnosis not present

## 2021-08-11 DIAGNOSIS — R0602 Shortness of breath: Secondary | ICD-10-CM

## 2021-08-11 DIAGNOSIS — R0989 Other specified symptoms and signs involving the circulatory and respiratory systems: Secondary | ICD-10-CM | POA: Diagnosis not present

## 2021-08-11 DIAGNOSIS — E039 Hypothyroidism, unspecified: Secondary | ICD-10-CM | POA: Diagnosis not present

## 2021-08-11 DIAGNOSIS — Z9861 Coronary angioplasty status: Secondary | ICD-10-CM

## 2021-08-11 MED ORDER — PROAIR HFA 108 (90 BASE) MCG/ACT IN AERS: 2.0000 | INHALATION_SPRAY | RESPIRATORY_TRACT | 1 refills | Status: DC | PRN

## 2021-08-11 MED ORDER — NITROGLYCERIN 0.4 MG SL SUBL: 0.4000 mg | SUBLINGUAL_TABLET | SUBLINGUAL | 0 refills | Status: AC | PRN

## 2021-08-11 NOTE — Progress Notes (Signed)
Acute Office Visit  Subjective:    Patient ID: Kim Lane, female    DOB: 1933/12/28, 87 y.o.   MRN: [id number]  Chief Complaint  Patient presents with   Acute Visit   Shortness of Breath    HPI Patient is in today for c/o shortness of breath. Patient states Friday night (3 days ago) had sudden onset of shortness of breath and her blood pressure was high at 159/100. States her daughter was out of town and advised her to call 911 which she didn't. Reports prayed and 2-3 hours later felt better. Felt better over the weekend and today feels fine. During dyspnea episode states she did not have chest pain, arm pain, slurred speech, confusion, dizziness, n/v, or syncope. No recent travel. Does report today she has developed a runny nose, some mucus and her right upper eyelid is a little swollen. Denies fever, cough, eye pain, sore throat, headache, or recent sick contact exposures. Patient has been vaccinated with two full doses and a booster of [place]. Patient reports was prescribed albuterol inhaler in the past but unsure why. Denies hx of asthma or COPD. Also requesting rx for nitroglycerin sublingual tablet, does not want spray.  Past Medical History:  Diagnosis Date   Anemia    Anginal pain ([place])    occ   Arthritis    Blood dyscrasia 12/2016   hodgins lymphoma tx-remission   CAD (coronary artery disease) 02/2006   Taxus stent placed to LAD and Diagonal per Dr. Olevia Perches   Cataracts, bilateral    Complication of anesthesia    HTN (hypertension)    Hypercholesterolemia    Hypothyroidism    IBS (irritable bowel syndrome)    Osteoarthritis    PONV (postoperative nausea and vomiting)    Stroke ([place]) 03/2016    Past Surgical History:  Procedure Laterality Date   APPENDECTOMY     IR GENERIC HISTORICAL  01/25/2017   IR US GUIDE VASC ACCESS RIGHT 01/25/2017 [name], MD [facility]   IR GENERIC HISTORICAL  01/25/2017   IR FLUORO GUIDE PORT INSERTION RIGHT 01/25/2017 [name],  MD [facility]   IR REMOVAL TUN ACCESS W/ PORT W/O FL MOD SED  10/28/2017   KYPHOPLASTY N/A 06/24/2017   Procedure: KYPHOPLASTY T10;  Surgeon: Melina Schools, MD;  Location: [place];  Service: Orthopedics;  Laterality: N/A;  90 mins   LEFT HEART CATH AND CORONARY ANGIOGRAPHY N/A 06/16/2017   Procedure: Left Heart Cath and Coronary Angiography;  Surgeon: Sherren Mocha, MD;  Location: [facility];  Service: Cardiovascular;  Laterality: N/A;   MASS EXCISION Right 12/22/2016   Procedure: RIGHT NECK LYMPH NODE BIOPSY;  Surgeon: Rozetta Nunnery, MD;  Location: [facility];  Service: ENT;  Laterality: Right;   SPINE SURGERY     stents     in heart   TONSILLECTOMY     TOTAL KNEE ARTHROPLASTY Bilateral 2011,2012   VAGINAL HYSTERECTOMY      Family History  Problem Relation Age of Onset   Diabetes Mother    Dementia Mother    ALS Mother    Heart Problems Father    Liver disease Sister    Cirrhosis Sister    Heart block Other        CABG   Diabetes Sister    Heart block Son        CABG   Other Brother        GOOD HEALTH   Other Daughter  GOOD HEALTH   Dementia Sister    Breast cancer Neg Hx     Social History   Socioeconomic History   Marital status: Widowed    Spouse name: Not on file   Number of children: 2   Years of education: 12   Highest education level: Not on file  Occupational History    Employer: [employer], retired  Tobacco Use   Smoking status: Former   Smokeless tobacco: Never   Tobacco comments:    quit smoking 30 years ago, very light smoker  Vaping Use   Vaping Use: Never used  Substance and Sexual Activity   Alcohol use: No    Alcohol/week: 0.0 standard drinks   Drug use: No   Sexual activity: Not on file  Other Topics Concern   Not on file  Social History Narrative   08/29/19 Lives alone   caffeine use- coffee- 5 cups daily   Social Determinants of Health   Financial Resource Strain:  Not on file  Food Insecurity: Not on file  Transportation Needs: Not on file  Physical Activity: Not on file  Stress: Not on file  Social Connections: Not on file  Intimate Partner Violence: Not on file    Outpatient Medications Prior to Visit  Medication Sig Dispense Refill   aspirin 81 MG tablet Take 81 mg by mouth every other day.     carvedilol (COREG) 3.125 MG tablet TAKE 1 TABLET BY MOUTH EVERY OTHER MORNING 45 tablet 0   Cholecalciferol (HM VITAMIN D3) 100 MCG (4000 UT) CAPS 4,000 IU QD 30 capsule    Coenzyme Q10 100 MG capsule Take 100 mg by mouth daily.      diclofenac Sodium (VOLTAREN) 1 % GEL Apply 2 g topically 4 (four) times daily. To affected joint for pain prn 100 g 0   levothyroxine (SYNTHROID) 112 MCG tablet TAKE 1 TABLET BY MOUTH DAILY 90 tablet 0   Melatonin 2.5 MG CAPS Take 1 capsule by mouth at bedtime.     Multiple Vitamins-Minerals (CENTRUM SILVER PO) Take 1 tablet by mouth daily.     Omega-3 Fatty Acids (FISH OIL) 1000 MG CAPS Take 1 capsule by mouth daily.     RESTASIS 0.05 % ophthalmic emulsion Place 2 drops into both eyes 2 (two) times daily. For dry eyes     rosuvastatin (CRESTOR) 10 MG tablet Take 1 tablet (10 mg total) by mouth at bedtime. 90 tablet 3   triamcinolone cream (KENALOG) 0.1 % Apply 1 application topically 2 (two) times daily. 60 g 0   nitroGLYCERIN (NITROLINGUAL) 0.4 MG/SPRAY spray Place 1 spray under the tongue every 5 (five) minutes x 3 doses as needed for chest pain (3 sprays max).     PROAIR HFA 108 (90 Base) MCG/ACT inhaler Inhale 2 puffs into the lungs every 4 (four) hours as needed for shortness of breath.      No facility-administered medications prior to visit.    Allergies  Allergen Reactions   Fosamax [Alendronate Sodium] Other (See Comments)    "made me ache all over"    Review of Systems Review of Systems:  A fourteen system review of systems was performed and found to be positive as per HPI.    Objective:    Physical  Exam Constitutional:      General: She is not in acute distress.    Appearance: She is not toxic-appearing or diaphoretic.  HENT:     Head: Normocephalic  and atraumatic.     Right Ear: Tympanic membrane and external ear normal.     Left Ear: Tympanic membrane and external ear normal.     Nose: Rhinorrhea present. Rhinorrhea is clear.     Right Sinus: No frontal sinus tenderness.     Left Sinus: No frontal sinus tenderness.     Comments: Slightly boggy turbinates    Mouth/Throat:     Mouth: Mucous membranes are moist.     Pharynx: Oropharynx is clear.  Eyes:     Extraocular Movements: Extraocular movements intact.     Pupils: Pupils are equal, round, and reactive to light.  Cardiovascular:     Rate and Rhythm: Normal rate and regular rhythm.  Pulmonary:     Effort: Pulmonary effort is normal.     Breath sounds: Normal breath sounds.  Chest:     Chest wall: No tenderness or edema.  Abdominal:     Palpations: Abdomen is soft.     Tenderness: There is no abdominal tenderness.  Musculoskeletal:     Cervical back: Normal range of motion and neck supple.     Right lower leg: No edema.     Left lower leg: No edema.  Lymphadenopathy:     Cervical: No cervical adenopathy.  Skin:    General: Skin is warm.     Findings: No ecchymosis or rash.  Neurological:     General: No focal deficit present.     Mental Status: She is alert and oriented to person, place, and time.     Cranial Nerves: No dysarthria or facial asymmetry.     Sensory: Sensation is intact.     Motor: Motor function is intact.     Gait: Gait is intact.  Psychiatric:        Mood and Affect: Mood normal.        Behavior: Behavior normal.    BP 132/84   Pulse 76   Temp 98.6 F (37 C)   Ht _0  (1.549 m)   Wt 129 lb (58.5 kg)   SpO2 96%   BMI 24.37 kg/m  Wt Readings from Last 3 Encounters:  08/11/21 129 lb (58.5 kg)  07/02/21 131 lb 9.6 oz (59.7 kg)  06/27/21 130 lb 14.4 oz (59.4 kg)    Health  Maintenance Due  Topic Date Due   Zoster Vaccines- Shingrix (1 of 2) Never done   COVID-19 Vaccine (3 - Pfizer risk series) 03/19/2020   INFLUENZA VACCINE  07/21/2021    There are no preventive care reminders to display for this patient.   Lab Results  Component Value Date   TSH 3.950 04/02/2021   Lab Results  Component Value Date   WBC 7.4 06/27/2021   HGB 12.7 06/27/2021   HCT 38.3 06/27/2021   MCV 89.7 06/27/2021   PLT 337 06/27/2021   Lab Results  Component Value Date   NA 140 06/27/2021   K 4.7 06/27/2021   CHLORIDE 105 10/07/2017   CO2 30 06/27/2021   GLUCOSE 103 (H) 06/27/2021   BUN 13 06/27/2021   CREATININE 0.99 06/27/2021   BILITOT 0.5 06/27/2021   ALKPHOS 54 06/27/2021   AST 23 06/27/2021   ALT 20 06/27/2021   PROT 7.0 06/27/2021   ALBUMIN 3.7 06/27/2021   CALCIUM 9.3 06/27/2021   ANIONGAP 7 06/27/2021   EGFR 55 (L) 02/17/2021   GFR 66.34 09/14/2012   Lab Results  Component Value Date   CHOL 187 10/09/2020   Lab Results  Component Value Date   HDL 63 10/09/2020   Lab Results  Component Value Date   LDLCALC 108 (H) 10/09/2020   Lab Results  Component Value Date   TRIG 91 10/09/2020   Lab Results  Component Value Date   CHOLHDL 3.0 10/09/2020   Lab Results  Component Value Date   HGBA1C 6.2 (H) 02/17/2021       Assessment & Plan:   Problem List Items Addressed This Visit       Cardiovascular and Mediastinum   CAD S/P percutaneous coronary angioplasty (Chronic)   Relevant Medications   nitroGLYCERIN (NITROSTAT) 0.4 MG SL tablet     Endocrine   Hypothyroidism (Chronic)   Relevant Orders   TSH   T4, free   T3     Other   Dyspnea - Primary   Relevant Medications   PROAIR HFA 108 (90 Base) MCG/ACT inhaler   Other Relevant Orders   Comp Met ([facility])   TSH   T4, free   T3   D-Dimer, Quantitative   CBC w/Diff   Novel Coronavirus, NAA ([facility])   Other Visit Diagnoses     Runny nose       Relevant Orders   Novel  Coronavirus, NAA ([facility])      Dyspnea: -Etiology unclear. Will obtain labs for further evaluation. Patient is hemodynamically stable with no red flag signs on exam and symptoms have improved so recommend monitoring. Will provide new rx for Proair inhaler to use as needed for shortness of breath (current inhaler expired 2019). Red flag s/s discussed and patient is aware to seek immediate medical care.  Runny nose: -Discussed with patient symptoms possibly allergic rhinitis, however, due to episode of shortness of breath recommend ruling out Covid-19 infection, patient is agreeable.   CAD S/P percutaneous coronary angioplasty: -Followed by Cardiology. Reviewed EKG 07/03/2021, NSR rate 59, no acute ST-T wave changes.  -Will provide rx for Nitrostat 0.4 mg SL.    Meds ordered this encounter  Medications   nitroGLYCERIN (NITROSTAT) 0.4 MG SL tablet    Sig: Place 1 tablet (0.4 mg total) under the tongue every 5 (five) minutes as needed for chest pain.    Dispense:  30 tablet    Refill:  0   PROAIR HFA 108 (90 Base) MCG/ACT inhaler    Sig: Inhale 2 puffs into the lungs every 4 (four) hours as needed for shortness of breath.    Dispense:  18 g    Refill:  [place], PA-C

## 2021-08-11 NOTE — Patient Instructions (Signed)
Shortness of Breath, Adult Shortness of breath means you have trouble breathing. Shortness of breath couldbe a sign of a medical problem. Follow these instructions at home:  Watch for any changes in your symptoms. Do not use any products that contain nicotine or tobacco, such as cigarettes, e-cigarettes, and chewing tobacco. Do not smoke. Smoking can cause shortness of breath. If you need help to quit smoking, ask your doctor. Avoid things that can make it harder to breathe, such as: Mold. Dust. Air pollution. Chemical smells. Things that can cause allergy symptoms (allergens), if you have allergies. Keep your living space clean. Use products that help remove mold and dust. Rest as needed. Slowly return to your normal activities. Take over-the-counter and prescription medicines only as told by your doctor. This includes oxygen therapy and inhaled medicines. Keep all follow-up visits as told by your doctor. This is important. Contact a doctor if: Your condition does not get better as soon as expected. You have a hard time doing your normal activities, even after you rest. You have new symptoms. Get help right away if: Your shortness of breath gets worse. You have trouble breathing when you are resting. You feel light-headed or you pass out (faint). You have a cough that is not helped by medicines. You cough up blood. You have pain with breathing. You have pain in your chest, arms, shoulders, or belly (abdomen). You have a fever. You cannot walk up stairs. You cannot exercise the way you normally do. These symptoms may represent a serious problem that is an emergency. Do not wait to see if the symptoms will go away. Get medical help right away. Call your local emergency services (911 in the [place] Do not drive yourself to the hospital. Summary Shortness of breath is when you have trouble breathing enough air. It can be a sign of a medical problem. Avoid things that make it hard for  you to breathe, such as smoking, pollution, mold, and dust. Watch for any changes in your symptoms. Contact your doctor if you do not get better or you get worse. This information is not intended to replace advice given to you by your health care provider. Make sure you discuss any questions you have with your healthcare provider. Document Revised: 05/09/2018 Document Reviewed: 05/09/2018 Elsevier Patient Education  2022 [employer].

## 2021-08-11 NOTE — Telephone Encounter (Signed)
Called patient back about message. Patient stated she did have some SOB over the weekend and that she is feeling somewhat better today. Patient stated she has an appointment with her PCP this afternoon and she will call back if she needs to see Dr. Johnsie Cancel.

## 2021-08-11 NOTE — Telephone Encounter (Signed)
Follow up:     Patient calling to check the status of her last message

## 2021-08-11 NOTE — Telephone Encounter (Signed)
   Pt c/o Shortness Of Breath: STAT if SOB developed within the last 24 hours or pt is noticeably SOB on the phone  1. Are you currently SOB (can you hear that pt is SOB on the phone)? No  2. How long have you been experiencing SOB? This weekend  3. Are you SOB when sitting or when up moving around? Move around   4. Are you currently experiencing any other symptoms? Fatigue   Pt been experiencing SOB this weekend, she said it is not as bad today but she still feeling weak.

## 2021-08-12 ENCOUNTER — Other Ambulatory Visit: Payer: Self-pay

## 2021-08-12 DIAGNOSIS — R0602 Shortness of breath: Secondary | ICD-10-CM

## 2021-08-12 LAB — SARS-COV-2, NAA 2 DAY TAT

## 2021-08-12 LAB — SPECIMEN STATUS

## 2021-08-12 LAB — NOVEL CORONAVIRUS, NAA: SARS-CoV-2, NAA: NOT DETECTED

## 2021-08-13 ENCOUNTER — Other Ambulatory Visit: Payer: Self-pay

## 2021-08-13 ENCOUNTER — Ambulatory Visit
Admission: RE | Admit: 2021-08-13 | Discharge: 2021-08-13 | Disposition: A | Discharge status: Home / Self Care | Payer: Medicare PPO | Source: Ambulatory Visit | Attending: Physician Assistant | Admitting: Physician Assistant

## 2021-08-13 DIAGNOSIS — R0602 Shortness of breath: Secondary | ICD-10-CM | POA: Diagnosis not present

## 2021-08-15 ENCOUNTER — Other Ambulatory Visit: Payer: Self-pay

## 2021-08-15 ENCOUNTER — Other Ambulatory Visit: Payer: Self-pay | Admitting: Physician Assistant

## 2021-08-15 ENCOUNTER — Other Ambulatory Visit: Payer: Medicare PPO

## 2021-08-15 DIAGNOSIS — R0602 Shortness of breath: Secondary | ICD-10-CM | POA: Diagnosis not present

## 2021-08-15 DIAGNOSIS — I2510 Atherosclerotic heart disease of native coronary artery without angina pectoris: Secondary | ICD-10-CM

## 2021-08-15 DIAGNOSIS — R7989 Other specified abnormal findings of blood chemistry: Secondary | ICD-10-CM

## 2021-08-15 DIAGNOSIS — Z8673 Personal history of transient ischemic attack (TIA), and cerebral infarction without residual deficits: Secondary | ICD-10-CM

## 2021-08-15 DIAGNOSIS — C8190 Hodgkin lymphoma, unspecified, unspecified site: Secondary | ICD-10-CM

## 2021-08-16 LAB — D-DIMER, QUANTITATIVE: D-DIMER: 0.68 mg/L FEU — ABNORMAL HIGH (ref 0.00–0.49)

## 2021-08-18 ENCOUNTER — Ambulatory Visit: Admission: RE | Admit: 2021-08-18 | Payer: Medicare PPO | Source: Ambulatory Visit

## 2021-08-18 ENCOUNTER — Other Ambulatory Visit: Payer: Self-pay | Admitting: Physician Assistant

## 2021-08-18 ENCOUNTER — Telehealth: Payer: Self-pay | Admitting: Physician Assistant

## 2021-08-18 ENCOUNTER — Other Ambulatory Visit: Payer: Self-pay

## 2021-08-18 ENCOUNTER — Ambulatory Visit
Admission: RE | Admit: 2021-08-18 | Discharge: 2021-08-18 | Disposition: A | Discharge status: Home / Self Care | Payer: Medicare PPO | Source: Ambulatory Visit | Attending: Physician Assistant | Admitting: Physician Assistant

## 2021-08-18 DIAGNOSIS — J219 Acute bronchiolitis, unspecified: Secondary | ICD-10-CM | POA: Diagnosis not present

## 2021-08-18 DIAGNOSIS — R0602 Shortness of breath: Secondary | ICD-10-CM

## 2021-08-18 DIAGNOSIS — R079 Chest pain, unspecified: Secondary | ICD-10-CM

## 2021-08-18 DIAGNOSIS — R7989 Other specified abnormal findings of blood chemistry: Secondary | ICD-10-CM

## 2021-08-18 DIAGNOSIS — I700 Atherosclerosis of aorta: Secondary | ICD-10-CM | POA: Diagnosis not present

## 2021-08-18 DIAGNOSIS — I2510 Atherosclerotic heart disease of native coronary artery without angina pectoris: Secondary | ICD-10-CM | POA: Diagnosis not present

## 2021-08-18 DIAGNOSIS — J929 Pleural plaque without asbestos: Secondary | ICD-10-CM | POA: Diagnosis not present

## 2021-08-18 LAB — D-DIMER, QUANTITATIVE

## 2021-08-18 LAB — T4, FREE: Free T4: 1.08 ng/dL (ref 0.82–1.77)

## 2021-08-18 LAB — COMPREHENSIVE METABOLIC PANEL
ALT: 23 IU/L (ref 0–32)
AST: 38 IU/L (ref 0–40)
Albumin/Globulin Ratio: 2 (ref 1.2–2.2)
Albumin: 4.7 g/dL — ABNORMAL HIGH (ref 3.6–4.6)
Alkaline Phosphatase: 72 IU/L (ref 44–121)
BUN/Creatinine Ratio: 13 (ref 12–28)
BUN: 14 mg/dL (ref 8–27)
Bilirubin Total: 0.3 mg/dL (ref 0.0–1.2)
CO2: 24 mmol/L (ref 20–29)
Calcium: 9.5 mg/dL (ref 8.7–10.3)
Chloride: 100 mmol/L (ref 96–106)
Creatinine, Ser: 1.04 mg/dL — ABNORMAL HIGH (ref 0.57–1.00)
Globulin, Total: 2.4 g/dL (ref 1.5–4.5)
Glucose: 83 mg/dL (ref 65–99)
Potassium: 4.2 mmol/L (ref 3.5–5.2)
Sodium: 141 mmol/L (ref 134–144)
Total Protein: 7.1 g/dL (ref 6.0–8.5)
eGFR: 53 mL/min/{1.73_m2} — ABNORMAL LOW (ref >59)

## 2021-08-18 LAB — CBC WITH DIFFERENTIAL/PLATELET
Basophils Absolute: 0.1 10*3/uL (ref 0.0–0.2)
Basos: 1 %
EOS (ABSOLUTE): 1 10*3/uL — ABNORMAL HIGH (ref 0.0–0.4)
Eos: 13 %
Hematocrit: 37.9 % (ref 34.0–46.6)
Hemoglobin: 12.7 g/dL (ref 11.1–15.9)
Immature Grans (Abs): 0 10*3/uL (ref 0.0–0.1)
Immature Granulocytes: 0 %
Lymphocytes Absolute: 1.4 10*3/uL (ref 0.7–3.1)
Lymphs: 19 %
MCH: 29.1 pg (ref 26.6–33.0)
MCHC: 33.5 g/dL (ref 31.5–35.7)
MCV: 87 fL (ref 79–97)
Monocytes Absolute: 0.6 10*3/uL (ref 0.1–0.9)
Monocytes: 8 %
Neutrophils Absolute: 4.6 10*3/uL (ref 1.4–7.0)
Neutrophils: 59 %
Platelets: 358 10*3/uL (ref 150–450)
RBC: 4.36 x10E6/uL (ref 3.77–5.28)
RDW: 13 % (ref 11.7–15.4)
WBC: 7.7 10*3/uL (ref 3.4–10.8)

## 2021-08-18 LAB — SPECIMEN STATUS REPORT

## 2021-08-18 LAB — T3: T3, Total: 77 ng/dL (ref 71–180)

## 2021-08-18 LAB — TSH: TSH: 4.19 u[IU]/mL (ref 0.450–4.500)

## 2021-08-18 MED ORDER — IOPAMIDOL (ISOVUE-370) INJECTION 76%
75.0000 mL | Freq: Once | INTRAVENOUS | Status: AC | PRN
  Administered 2021-08-18: 75 mL via INTRAVENOUS

## 2021-08-18 NOTE — Telephone Encounter (Signed)
Scheduled appointment for patient with [facility]. Patient advised to walk in today before 3pm for Stat CT. Patient leaving home now to go. AS, CMA

## 2021-08-18 NOTE — Addendum Note (Signed)
Addended by: Mickel Crow on: 08/18/2021 08:42 AM   Modules accepted: Orders

## 2021-08-18 NOTE — Telephone Encounter (Signed)
Patient is at walk in service at [facility] imaging and needs a prior authorization for CT and ANGO chest. Please advise, thanks.

## 2021-08-18 NOTE — Telephone Encounter (Signed)
AUTH# [id number] Valid 08/18/21-09/18/21. AS, CMA

## 2021-08-19 ENCOUNTER — Telehealth: Payer: Self-pay | Admitting: Physician Assistant

## 2021-08-19 NOTE — Telephone Encounter (Signed)
Please see result notes. AS< CMA

## 2021-08-19 NOTE — Telephone Encounter (Signed)
Patient would like to know the results of her scans yesterday. Please advise. Thanks

## 2021-08-20 ENCOUNTER — Telehealth: Payer: Self-pay | Admitting: Physician Assistant

## 2021-08-20 DIAGNOSIS — R7989 Other specified abnormal findings of blood chemistry: Secondary | ICD-10-CM

## 2021-08-20 NOTE — Addendum Note (Signed)
Addended by: Mickel Crow on: 08/20/2021 01:45 PM   Modules accepted: Orders

## 2021-08-20 NOTE — Telephone Encounter (Signed)
Patient called office requesting a DVT study since her D Dimer was positive. Please advise. AS, CMA

## 2021-08-20 NOTE — Telephone Encounter (Signed)
Order has been placed.

## 2021-08-21 ENCOUNTER — Ambulatory Visit
Admission: RE | Admit: 2021-08-21 | Discharge: 2021-08-21 | Disposition: A | Discharge status: Home / Self Care | Payer: Medicare PPO | Source: Ambulatory Visit | Attending: Physician Assistant | Admitting: Physician Assistant

## 2021-08-21 DIAGNOSIS — R0602 Shortness of breath: Secondary | ICD-10-CM | POA: Diagnosis not present

## 2021-08-21 DIAGNOSIS — R7989 Other specified abnormal findings of blood chemistry: Secondary | ICD-10-CM

## 2021-08-30 ENCOUNTER — Other Ambulatory Visit: Payer: Self-pay | Admitting: Physician Assistant

## 2021-08-30 DIAGNOSIS — I10 Essential (primary) hypertension: Secondary | ICD-10-CM

## 2021-09-13 ENCOUNTER — Other Ambulatory Visit: Payer: Self-pay | Admitting: Physician Assistant

## 2021-09-13 DIAGNOSIS — E039 Hypothyroidism, unspecified: Secondary | ICD-10-CM

## 2021-10-08 ENCOUNTER — Other Ambulatory Visit: Payer: Self-pay

## 2021-10-08 ENCOUNTER — Other Ambulatory Visit: Payer: Medicare PPO | Admitting: *Deleted

## 2021-10-08 DIAGNOSIS — I2510 Atherosclerotic heart disease of native coronary artery without angina pectoris: Secondary | ICD-10-CM | POA: Diagnosis not present

## 2021-10-08 DIAGNOSIS — Z9861 Coronary angioplasty status: Secondary | ICD-10-CM | POA: Diagnosis not present

## 2021-10-08 DIAGNOSIS — Z8673 Personal history of transient ischemic attack (TIA), and cerebral infarction without residual deficits: Secondary | ICD-10-CM | POA: Diagnosis not present

## 2021-10-08 LAB — HEPATIC FUNCTION PANEL
ALT: 23 IU/L (ref 0–32)
AST: 28 IU/L (ref 0–40)
Albumin: 3.9 g/dL (ref 3.6–4.6)
Alkaline Phosphatase: 65 IU/L (ref 44–121)
Bilirubin Total: 0.4 mg/dL (ref 0.0–1.2)
Bilirubin, Direct: 0.17 mg/dL (ref 0.00–0.40)
Total Protein: 6.8 g/dL (ref 6.0–8.5)

## 2021-10-08 LAB — LIPID PANEL
Chol/HDL Ratio: 3.1 ratio (ref 0.0–4.4)
Cholesterol, Total: 164 mg/dL (ref 100–199)
HDL: 53 mg/dL (ref >39)
LDL Chol Calc (NIH): 98 mg/dL (ref 0–99)
Triglycerides: 68 mg/dL (ref 0–149)
VLDL Cholesterol Cal: 13 mg/dL (ref 5–40)

## 2021-10-17 ENCOUNTER — Encounter: Payer: Self-pay | Admitting: Physician Assistant

## 2021-10-17 ENCOUNTER — Ambulatory Visit (INDEPENDENT_AMBULATORY_CARE_PROVIDER_SITE_OTHER): Payer: Medicare PPO | Admitting: Physician Assistant

## 2021-10-17 ENCOUNTER — Other Ambulatory Visit: Payer: Self-pay

## 2021-10-17 VITALS — BP 102/65 | HR 88 | Temp 97.9°F | Ht 61.0 in | Wt 126.0 lb

## 2021-10-17 DIAGNOSIS — Z0000 Encounter for general adult medical examination without abnormal findings: Secondary | ICD-10-CM

## 2021-10-17 NOTE — Patient Instructions (Signed)
Preventive Care 85 Years and Older, Female Preventive care refers to lifestyle choices and visits with your health care provider that can promote health and wellness. This includes: A yearly physical exam. This is also called an annual wellness visit. Regular dental and eye exams. Immunizations. Screening for certain conditions. Healthy lifestyle choices, such as: Eating a healthy diet. Getting regular exercise. Not using drugs or products that contain nicotine and tobacco. Limiting alcohol use. What can I expect for my preventive care visit? Physical exam Your health care provider will check your: Height and weight. These may be used to calculate your BMI (body mass index). BMI is a measurement that tells if you are at a healthy weight. Heart rate and blood pressure. Body temperature. Skin for abnormal spots. Counseling Your health care provider may ask you questions about your: Past medical problems. Family's medical history. Alcohol, tobacco, and drug use. Emotional well-being. Home life and relationship well-being. Sexual activity. Diet, exercise, and sleep habits. History of falls. Memory and ability to understand (cognition). Work and work Statistician. Pregnancy and menstrual history. Access to firearms. What immunizations do I need? Vaccines are usually given at various ages, according to a schedule. Your health care provider will recommend vaccines for you based on your age, medical history, and lifestyle or other factors, such as travel or where you work. What tests do I need? Blood tests Lipid and cholesterol levels. These may be checked every 5 years, or more often depending on your overall health. Hepatitis C test. Hepatitis B test. Screening Lung cancer screening. You may have this screening every year starting at age 85 if you have a 30-pack-year history of smoking and currently smoke or have quit within the past 15 years. Colorectal cancer screening. All  adults should have this screening starting at age 85 and continuing until age 9. Your health care provider may recommend screening at age 85 if you are at increased risk. You will have tests every 1-10 years, depending on your results and the type of screening test. Diabetes screening. This is done by checking your blood sugar (glucose) after you have not eaten for a while (fasting). You may have this done every 1-3 years. Mammogram. This may be done every 1-2 years. Talk with your health care provider about how often you should have regular mammograms. Abdominal aortic aneurysm (AAA) screening. You may need this if you are a current or former smoker. BRCA-related cancer screening. This may be done if you have a family history of breast, ovarian, tubal, or peritoneal cancers. Other tests STD (sexually transmitted disease) testing, if you are at risk. Bone density scan. This is done to screen for osteoporosis. You may have this done starting at age 85. Talk with your health care provider about your test results, treatment options, and if necessary, the need for more tests. Follow these instructions at home: Eating and drinking  Eat a diet that includes fresh fruits and vegetables, whole grains, lean protein, and low-fat dairy products. Limit your intake of foods with high amounts of sugar, saturated fats, and salt. Take vitamin and mineral supplements as recommended by your health care provider. Do not drink alcohol if your health care provider tells you not to drink. If you drink alcohol: Limit how much you have to 0-1 drink a day. Be aware of how much alcohol is in your drink. In the [place] one drink equals one 12 oz bottle of beer (355 mL), one 5 oz glass of wine (148 mL), or one  1 oz glass of hard liquor (44 mL). Lifestyle Take daily care of your teeth and gums. Brush your teeth every morning and night with fluoride toothpaste. Floss one time each day. Stay active. Exercise for at least  30 minutes 5 or more days each week. Do not use any products that contain nicotine or tobacco, such as cigarettes, e-cigarettes, and chewing tobacco. If you need help quitting, ask your health care provider. Do not use drugs. If you are sexually active, practice safe sex. Use a condom or other form of protection in order to prevent STIs (sexually transmitted infections). Talk with your health care provider about taking a low-dose aspirin or statin. Find healthy ways to cope with stress, such as: Meditation, yoga, or listening to music. Journaling. Talking to a trusted person. Spending time with friends and family. Safety Always wear your seat belt while driving or riding in a vehicle. Do not drive: If you have been drinking alcohol. Do not ride with someone who has been drinking. When you are tired or distracted. While texting. Wear a helmet and other protective equipment during sports activities. If you have firearms in your house, make sure you follow all gun safety procedures. What's next? Visit your health care provider once a year for an annual wellness visit. Ask your health care provider how often you should have your eyes and teeth checked. Stay up to date on all vaccines. This information is not intended to replace advice given to you by your health care provider. Make sure you discuss any questions you have with your health care provider. Document Revised: 02/14/2021 Document Reviewed: 12/01/2018 Elsevier Patient Education  2022 [employer].

## 2021-10-17 NOTE — Progress Notes (Signed)
Subjective:   Kim Lane is a 87 y.o. female who presents for Medicare Annual (Subsequent) preventive examination.  Review of Systems    General:   No F/C, wt loss Pulm:   No DIB, SOB, pleuritic chest pain Card:  No CP, palpitations Abd:  No n/v/d or pain Ext:  No inc edema from baseline    Objective:    Today's Vitals   10/17/21 1021  BP: 102/65  Pulse: 88  Temp: 97.9 F (36.6 C)  SpO2: 99%  Weight: 126 lb (57.2 kg)  Height: 5\' 1"  (1.549 m)   Body mass index is 23.81 kg/m.  Advanced Directives 12/04/2020 05/06/2020 01/05/2019 10/28/2017 07/07/2017 07/02/2017 06/16/2017  Does Patient Have a Medical Advance Directive? Yes Yes Yes Yes Yes Yes Yes  Type of Paramedic of [place];Living will [place];Living will [place];Living will [place];Living will [place];Living will [place];Living will [place];Living will  Does patient want to make changes to medical advance directive? - No - Patient declined No - Patient declined No - Patient declined - No - Patient declined No - Patient declined  Copy of [place] in Chart? No - copy requested No - copy requested - Yes Yes - Yes    Current Medications (verified) Outpatient Encounter Medications as of 10/17/2021  Medication Sig   aspirin 81 MG tablet Take 81 mg by mouth every other day.   carvedilol (COREG) 3.125 MG tablet TAKE 1 TABLET BY MOUTH EVERY OTHER MORNING   Cholecalciferol (HM VITAMIN D3) 100 MCG (4000 UT) CAPS 4,000 IU QD   diclofenac Sodium (VOLTAREN) 1 % GEL Apply 2 g topically 4 (four) times daily. To affected joint for pain prn   levothyroxine (SYNTHROID) 112 MCG tablet TAKE 1 TABLET BY MOUTH DAILY   Melatonin 2.5 MG CAPS Take 1 capsule by mouth at bedtime.   Multiple Vitamins-Minerals (CENTRUM SILVER PO) Take 1 tablet by mouth daily.   nitroGLYCERIN  (NITROSTAT) 0.4 MG SL tablet Place 1 tablet (0.4 mg total) under the tongue every 5 (five) minutes as needed for chest pain.   Omega-3 Fatty Acids (FISH OIL) 1000 MG CAPS Take 1 capsule by mouth daily.   PROAIR HFA 108 (90 Base) MCG/ACT inhaler Inhale 2 puffs into the lungs every 4 (four) hours as needed for shortness of breath.   RESTASIS 0.05 % ophthalmic emulsion Place 2 drops into both eyes 2 (two) times daily. For dry eyes   rosuvastatin (CRESTOR) 10 MG tablet Take 1 tablet (10 mg total) by mouth at bedtime.   triamcinolone cream (KENALOG) 0.1 % Apply 1 application topically 2 (two) times daily.   [DISCONTINUED] Coenzyme Q10 100 MG capsule Take 100 mg by mouth daily.    No facility-administered encounter medications on file as of 10/17/2021.    Allergies (verified) Fosamax [alendronate sodium]   History: Past Medical History:  Diagnosis Date   Anemia    Anginal pain (HCC)    occ   Arthritis    Blood dyscrasia 12/2016   hodgins lymphoma tx-remission   CAD (coronary artery disease) 02/2006   Taxus stent placed to LAD and Diagonal per Dr. Olevia Perches   Cataracts, bilateral    Complication of anesthesia    HTN (hypertension)    Hypercholesterolemia    Hypothyroidism    IBS (irritable bowel syndrome)    Osteoarthritis    PONV (postoperative nausea and vomiting)  Stroke [facility]) 03/2016   Past Surgical History:  Procedure Laterality Date   APPENDECTOMY     IR GENERIC HISTORICAL  01/25/2017   IR US GUIDE VASC ACCESS RIGHT 01/25/2017 [name], MD [facility]   IR GENERIC HISTORICAL  01/25/2017   IR FLUORO GUIDE PORT INSERTION RIGHT 01/25/2017 [name], MD [facility]   IR REMOVAL TUN ACCESS W/ PORT W/O FL MOD SED  10/28/2017   KYPHOPLASTY N/A 06/24/2017   Procedure: KYPHOPLASTY T10;  Surgeon: Melina Schools, MD;  Location: [place];  Service: Orthopedics;  Laterality: N/A;  90 mins   LEFT HEART CATH AND CORONARY ANGIOGRAPHY N/A 06/16/2017   Procedure: Left Heart Cath and Coronary  Angiography;  Surgeon: Sherren Mocha, MD;  Location: [place] [facility];  Service: Cardiovascular;  Laterality: N/A;   MASS EXCISION Right 12/22/2016   Procedure: RIGHT NECK LYMPH NODE BIOPSY;  Surgeon: Rozetta Nunnery, MD;  Location: [place];  Service: ENT;  Laterality: Right;   SPINE SURGERY     stents     in heart   TONSILLECTOMY     TOTAL KNEE ARTHROPLASTY Bilateral 2011,2012   VAGINAL HYSTERECTOMY     Family History  Problem Relation Age of Onset   Diabetes Mother    Dementia Mother    ALS Mother    Heart Problems Father    Liver disease Sister    Cirrhosis Sister    Heart block Other        CABG   Diabetes Sister    Heart block Son        CABG   Other Brother        GOOD HEALTH   Other Daughter        GOOD HEALTH   Dementia Sister    Breast cancer Neg Hx    Social History   Socioeconomic History   Marital status: Widowed    Spouse name: Not on file   Number of children: 2   Years of education: 12   Highest education level: Not on file  Occupational History    Employer: [employer], retired  Tobacco Use   Smoking status: Former   Smokeless tobacco: Never   Tobacco comments:    quit smoking 30 years ago, very light smoker  Vaping Use   Vaping Use: Never used  Substance and Sexual Activity   Alcohol use: No    Alcohol/week: 0.0 standard drinks   Drug use: No   Sexual activity: Not on file  Other Topics Concern   Not on file  Social History Narrative   08/29/19 Lives alone   caffeine use- coffee- 5 cups daily   Social Determinants of Health   Financial Resource Strain: Not on file  Food Insecurity: Not on file  Transportation Needs: Not on file  Physical Activity: Not on file  Stress: Not on file  Social Connections: Not on file    Tobacco Counseling Counseling given: Not Answered Tobacco comments: quit smoking 30 years ago, very light smoker     Diabetic? no   Activities of Daily  Living In your present state of health, do you have any difficulty performing the following activities: 10/17/2021 08/11/2021  Hearing? Tempie Donning  Vision? N N  Difficulty concentrating or making decisions? N -  Walking or climbing stairs? Y Y  Dressing or bathing? N N  Doing errands, shopping? N N  Some recent data might be hidden  Patient Care Team: Lorrene Reid, PA-C as PCP - General Josue Hector, MD as PCP - Cardiology (Cardiology) Josue Hector, MD as Consulting Physician (Cardiology) Penni Bombard, MD as Consulting Physician (Neurology) Jarome Matin, MD as Consulting Physician (Dermatology) Rozetta Nunnery, MD as Consulting Physician (Otolaryngology) Brunetta Genera, MD as Consulting Physician (Hematology) Melina Schools, MD as Consulting Physician (Orthopedic Surgery) Renette Butters, MD as Attending Physician (Orthopedic Surgery)  Indicate any recent Medical Services you may have received from other than [facility] providers in the past year (date may be approximate).     Assessment:   This is a routine wellness examination for [place].  Hearing/Vision screen No results found.  Dietary issues and exercise activities discussed: -Working on reducing sugar intake, continues with low fat diet. Stays active with doing house work.    Goals Addressed   None   Depression Screen PHQ 2/9 Scores 10/17/2021 08/11/2021 02/17/2021 10/17/2020 04/17/2020 08/03/2019 02/23/2019  PHQ - 2 Score 1 0 0 0 1 1 1   PHQ- 9 Score 7 4 4 5 5 7 6     Fall Risk Fall Risk  10/17/2021 08/11/2021 02/17/2021 10/17/2020 08/29/2019  Falls in the past year? 0 0 0 0 1  Number falls in past yr: 0 0 - - 0  Comment - - - - passed out  Injury with Fall? 0 0 - - 1  Comment - - - - abrasion on nose  Risk for fall due to : No Fall Risks No Fall Risks - - -  Follow up Falls evaluation completed Falls evaluation completed Falls evaluation completed Falls evaluation completed -    FALL RISK PREVENTION  PERTAINING TO THE HOME:  Any stairs in or around the home? Yes  If so, are there any without handrails? No  Home free of loose throw rugs in walkways, pet beds, electrical cords, etc? Yes  Adequate lighting in your home to reduce risk of falls? Yes   ASSISTIVE DEVICES UTILIZED TO PREVENT FALLS:  Life alert? Yes  Use of a cane, walker or w/c? Yes  Grab bars in the bathroom? Yes  Shower chair or bench in shower? Yes  Elevated toilet seat or a handicapped toilet? Yes   TIMED UP AND GO:  Was the test performed? Yes .  Length of time to ambulate 10 feet: 20 sec.   Gait slow and steady without use of assistive device  Cognitive Function: wnl's  6CIT Screen 10/17/2021 10/17/2020  What Year? 0 points 0 points  What month? 0 points 0 points  What time? 0 points 0 points  Count back from 20 0 points 0 points  Months in reverse 0 points 0 points  Repeat phrase 0 points 0 points  Total Score 0 0    Immunizations Immunization History  Administered Date(s) Administered   Influenza-Unspecified 09/20/2013, 09/19/2014, 09/21/2015, 09/22/2017   PFIZER(Purple Top)SARS-COV-2 Vaccination 01/26/2020, 02/20/2020   Pneumococcal Conjugate-13 09/08/2016   Pneumococcal-Unspecified 12/30/2010   Td 12/01/2007   Tdap 10/28/2018   Zoster, Live 11/30/2008    TDAP status: Up to date  Flu Vaccine status: Up to date  Pneumococcal vaccine status: Up to date  Covid-19 vaccine status: Completed vaccines  Qualifies for Shingles Vaccine? Yes   Zostavax completed Yes   Shingrix Completed?: No.    Education has been provided regarding the importance of this vaccine. Patient has been advised to call insurance company to determine out of pocket expense if they have not yet received this vaccine. Advised  may also receive vaccine at local pharmacy or Health Dept. Verbalized acceptance and understanding.  Screening Tests Health Maintenance  Topic Date Due   Zoster Vaccines- Shingrix (1 of 2) Never done    Pneumonia Vaccine 52+ Years old (2 - PPSV23 if available, else PCV20) 09/08/2017   COVID-19 Vaccine (3 - Pfizer risk series) 03/19/2020   INFLUENZA VACCINE  07/21/2021   TETANUS/TDAP  10/28/2028   DEXA SCAN  Completed   HPV VACCINES  Aged Out    Health Maintenance  Health Maintenance Due  Topic Date Due   Zoster Vaccines- Shingrix (1 of 2) Never done   Pneumonia Vaccine 15+ Years old (2 - PPSV23 if available, else PCV20) 09/08/2017   COVID-19 Vaccine (3 - Pfizer risk series) 03/19/2020   INFLUENZA VACCINE  07/21/2021    Colorectal cancer screening: Type of screening: Colonoscopy. Completed 11/19/2016. Repeat not necessary due to age.   Mammogram status: Completed 12/17/2020. Repeat every year  Bone Density status: Completed 05/12/2018. Results reflect: Bone density results: OSTEOPOROSIS. Repeat every 3 years.  Lung Cancer Screening: (Low Dose CT Chest recommended if Age 66-80 years, 30 pack-year currently smoking OR have quit w/in 15years.) does not qualify.   Lung Cancer Screening Referral: n/a  Additional Screening:  Hepatitis C Screening: does qualify; Completed 10/17/2020  Vision Screening: Recommended annual ophthalmology exams for early detection of glaucoma and other disorders of the eye. Is the patient up to date with their annual eye exam?  Yes  Who is the provider or what is the name of the office in which the patient attends annual eye exams? Dr. Leeanne Deed If pt is not established with a provider, would they like to be referred to a provider to establish care? No .   Dental Screening: Recommended annual dental exams for proper oral hygiene  Community Resource Referral / Chronic Care Management: CRR required this visit?  No   CCM required this visit?  No      Plan:  -Recommend to use good support system at home. Patient lost her brother and son-in-law earlier this year. -Continue current medication regimen. -Continue to follow up with various  specialists. -Follow up in 6 months for HTN, thyroid and HLD  I have personally reviewed and noted the following in the patient's chart:   Medical and social history Use of alcohol, tobacco or illicit drugs  Current medications and supplements including opioid prescriptions.  Functional ability and status Nutritional status Physical activity Advanced directives List of other physicians Hospitalizations, surgeries, and ER visits in previous 12 months Vitals Screenings to include cognitive, depression, and falls Referrals and appointments  In addition, I have reviewed and discussed with patient certain preventive protocols, quality metrics, and best practice recommendations. A written personalized care plan for preventive services as well as general preventive health recommendations were provided to patient.     Lorrene Reid, PA-C   10/17/2021

## 2021-10-29 ENCOUNTER — Other Ambulatory Visit: Payer: Self-pay

## 2021-10-29 ENCOUNTER — Ambulatory Visit (INDEPENDENT_AMBULATORY_CARE_PROVIDER_SITE_OTHER): Payer: Medicare PPO | Admitting: Nurse Practitioner

## 2021-10-29 ENCOUNTER — Encounter: Payer: Self-pay | Admitting: Nurse Practitioner

## 2021-10-29 VITALS — BP 122/68 | HR 67 | Temp 98.2°F | Ht 61.0 in | Wt 127.1 lb

## 2021-10-29 DIAGNOSIS — R519 Headache, unspecified: Secondary | ICD-10-CM | POA: Insufficient documentation

## 2021-10-29 DIAGNOSIS — J300 Vasomotor rhinitis: Secondary | ICD-10-CM | POA: Diagnosis not present

## 2021-10-29 DIAGNOSIS — J0140 Acute pansinusitis, unspecified: Secondary | ICD-10-CM | POA: Diagnosis not present

## 2021-10-29 MED ORDER — FLUTICASONE PROPIONATE 50 MCG/ACT NA SUSP: 1.0000 | Freq: Every day | NASAL | 1 refills | Status: DC

## 2021-10-29 MED ORDER — AMOXICILLIN 500 MG PO CAPS: 500.0000 mg | ORAL_CAPSULE | Freq: Two times a day (BID) | ORAL | 0 refills | Status: AC

## 2021-10-29 NOTE — Progress Notes (Signed)
Acute Office Visit  Subjective:    Patient ID: Kim Lane, female    DOB: [birth year], 87 y.o.   MRN: [id number]  Chief Complaint  Patient presents with   Headache    Headache  This is a new problem. The current episode started in the past 7 days. The problem occurs intermittently. The problem has been unchanged. The pain is located in the Right unilateral region. The pain does not radiate. The pain quality is not similar to prior headaches. The quality of the pain is described as throbbing and aching. Associated symptoms include drainage, eye pain, rhinorrhea and sinus pressure. Pertinent negatives include no ear pain, loss of balance, sore throat, swollen glands or tinnitus. Exacerbated by: bending down at the waist increase the headache. She has tried acetaminophen and NSAIDs (advil worked but tylenol did not work) for the symptoms. The treatment provided moderate relief.   The patient denies facial drooping or slurred speech. She denies weakness beyond her baseline. She denies nausea, vomiting, or diarrhea. She states that the headache is not severe, it has just lasted longer than any other headache she has had in the past.   Past Medical History:  Diagnosis Date   Anemia    Anginal pain (HCC)    occ   Arthritis    Blood dyscrasia 12/2016   hodgins lymphoma tx-remission   CAD (coronary artery disease) 02/2006   Taxus stent placed to LAD and Diagonal per Dr. Olevia Perches   Cataracts, bilateral    Complication of anesthesia    HTN (hypertension)    Hypercholesterolemia    Hypothyroidism    IBS (irritable bowel syndrome)    Osteoarthritis    PONV (postoperative nausea and vomiting)    Stroke ([place]) 03/2016    Past Surgical History:  Procedure Laterality Date   APPENDECTOMY     IR GENERIC HISTORICAL  01/25/2017   IR US GUIDE VASC ACCESS RIGHT 01/25/2017 [name], MD [facility]   IR GENERIC HISTORICAL  01/25/2017   IR FLUORO GUIDE PORT INSERTION RIGHT 01/25/2017 [name], MD [facility]   IR REMOVAL TUN ACCESS W/ PORT W/O FL MOD SED  10/28/2017   KYPHOPLASTY N/A 06/24/2017   Procedure: KYPHOPLASTY T10;  Surgeon: Melina Schools, MD;  Location: [place];  Service: Orthopedics;  Laterality: N/A;  90 mins   LEFT HEART CATH AND CORONARY ANGIOGRAPHY N/A 06/16/2017   Procedure: Left Heart Cath and Coronary Angiography;  Surgeon: Sherren Mocha, MD;  Location: [place] [facility];  Service: Cardiovascular;  Laterality: N/A;   MASS EXCISION Right 12/22/2016   Procedure: RIGHT NECK LYMPH NODE BIOPSY;  Surgeon: Rozetta Nunnery, MD;  Location: [place];  Service: ENT;  Laterality: Right;   SPINE SURGERY     stents     in heart   TONSILLECTOMY     TOTAL KNEE ARTHROPLASTY Bilateral 2011,2012   VAGINAL HYSTERECTOMY      Family History  Problem Relation Age of Onset   Diabetes Mother    Dementia Mother    ALS Mother    Heart Problems Father    Liver disease Sister    Cirrhosis Sister    Heart block Other        CABG   Diabetes Sister    Heart block Son        CABG   Other Brother        GOOD HEALTH   Other Daughter  GOOD HEALTH   Dementia Sister    Breast cancer Neg Hx     Social History   Socioeconomic History   Marital status: Widowed    Spouse name: Not on file   Number of children: 2   Years of education: 12   Highest education level: Not on file  Occupational History    Employer: [employer], retired  Tobacco Use   Smoking status: Former   Smokeless tobacco: Never   Tobacco comments:    quit smoking 30 years ago, very light smoker  Vaping Use   Vaping Use: Never used  Substance and Sexual Activity   Alcohol use: No    Alcohol/week: 0.0 standard drinks   Drug use: No   Sexual activity: Not on file  Other Topics Concern   Not on file  Social History Narrative   08/29/19 Lives alone   caffeine use- coffee- 5 cups daily   Social Determinants of Health   Financial  Resource Strain: Not on file  Food Insecurity: Not on file  Transportation Needs: Not on file  Physical Activity: Not on file  Stress: Not on file  Social Connections: Not on file  Intimate Partner Violence: Not on file    Outpatient Medications Prior to Visit  Medication Sig Dispense Refill   aspirin 81 MG tablet Take 81 mg by mouth every other day.     carvedilol (COREG) 3.125 MG tablet TAKE 1 TABLET BY MOUTH EVERY OTHER MORNING 45 tablet 0   Cholecalciferol (HM VITAMIN D3) 100 MCG (4000 UT) CAPS 4,000 IU QD 30 capsule    diclofenac Sodium (VOLTAREN) 1 % GEL Apply 2 g topically 4 (four) times daily. To affected joint for pain prn 100 g 0   levothyroxine (SYNTHROID) 112 MCG tablet TAKE 1 TABLET BY MOUTH DAILY 90 tablet 0   Melatonin 2.5 MG CAPS Take 1 capsule by mouth at bedtime.     Multiple Vitamins-Minerals (CENTRUM SILVER PO) Take 1 tablet by mouth daily.     nitroGLYCERIN (NITROSTAT) 0.4 MG SL tablet Place 1 tablet (0.4 mg total) under the tongue every 5 (five) minutes as needed for chest pain. 30 tablet 0   Omega-3 Fatty Acids (FISH OIL) 1000 MG CAPS Take 1 capsule by mouth daily.     PROAIR HFA 108 (90 Base) MCG/ACT inhaler Inhale 2 puffs into the lungs every 4 (four) hours as needed for shortness of breath. 18 g 1   RESTASIS 0.05 % ophthalmic emulsion Place 2 drops into both eyes 2 (two) times daily. For dry eyes     rosuvastatin (CRESTOR) 10 MG tablet Take 1 tablet (10 mg total) by mouth at bedtime. 90 tablet 3   triamcinolone cream (KENALOG) 0.1 % Apply 1 application topically 2 (two) times daily. 60 g 0   No facility-administered medications prior to visit.    Allergies  Allergen Reactions   Fosamax [Alendronate Sodium] Other (See Comments)    "made me ache all over"    Review of Systems  Constitutional:  Negative for chills and fatigue.  HENT:  Positive for congestion, postnasal drip, rhinorrhea, sinus pressure and sinus pain. Negative for ear discharge, ear pain,  sore throat and tinnitus.   Eyes:  Positive for pain.       Pain is mostly behind the right eye  Neurological:  Positive for headaches. Negative for loss of balance.      Objective:    Physical Exam Vitals and nursing  note reviewed.  Constitutional:      Appearance: Normal appearance. She is well-developed.  HENT:     Head: Normocephalic and atraumatic.     Right Ear: Hearing, tympanic membrane, ear canal and external ear normal.     Left Ear: Hearing, tympanic membrane, ear canal and external ear normal.     Nose: Congestion and rhinorrhea present.     Right Sinus: Maxillary sinus tenderness and frontal sinus tenderness present.     Left Sinus: Maxillary sinus tenderness and frontal sinus tenderness present.     Mouth/Throat:     Comments: Mild erythema in posterior pharynx  Eyes:     Extraocular Movements: Extraocular movements intact.     Conjunctiva/sclera: Conjunctivae normal.     Pupils: Pupils are equal, round, and reactive to light.  Cardiovascular:     Rate and Rhythm: Normal rate and regular rhythm.     Pulses: Normal pulses.     Heart sounds: Normal heart sounds.  Pulmonary:     Effort: Pulmonary effort is normal.     Breath sounds: Normal breath sounds.  Abdominal:     Palpations: Abdomen is soft.  Musculoskeletal:        General: Normal range of motion.     Cervical back: Normal range of motion and neck supple.  Lymphadenopathy:     Cervical: No cervical adenopathy.  Skin:    General: Skin is warm and dry.     Capillary Refill: Capillary refill takes less than 2 seconds.  Neurological:     General: No focal deficit present.     Mental Status: She is alert and oriented to person, place, and time.     Cranial Nerves: No cranial nerve deficit.     Sensory: No sensory deficit.     Motor: No weakness.     Coordination: Coordination normal.  Psychiatric:        Mood and Affect: Mood normal.        Behavior: Behavior normal.        Thought Content: Thought  content normal.        Judgment: Judgment normal.    Today's Vitals   10/29/21 1349  BP: 122/68  Pulse: 67  Temp: 98.2 F (36.8 C)  SpO2: 99%  Weight: 127 lb 1.3 oz (57.6 kg)  Height: 5\' 1"  (1.549 m)   Body mass index is 24.01 kg/m.   Wt Readings from Last 3 Encounters:  10/29/21 127 lb 1.3 oz (57.6 kg)  10/17/21 126 lb (57.2 kg)  08/11/21 129 lb (58.5 kg)    Health Maintenance Due  Topic Date Due   Zoster Vaccines- Shingrix (1 of 2) Never done   Pneumonia Vaccine 53+ Years old (2 - PPSV23 if available, else PCV20) 09/08/2017   COVID-19 Vaccine (3 - Pfizer risk series) 03/19/2020   INFLUENZA VACCINE  07/21/2021    There are no preventive care reminders to display for this patient.   Lab Results  Component Value Date   TSH 4.190 08/11/2021   Lab Results  Component Value Date   WBC 7.7 08/11/2021   HGB 12.7 08/11/2021   HCT 37.9 08/11/2021   MCV 87 08/11/2021   PLT 358 08/11/2021   Lab Results  Component Value Date   NA 141 08/11/2021   K 4.2 08/11/2021   CHLORIDE 105 10/07/2017   CO2 24 08/11/2021   GLUCOSE 83 08/11/2021   BUN 14 08/11/2021   CREATININE 1.04 (H) 08/11/2021   BILITOT 0.4 10/08/2021  ALKPHOS 65 10/08/2021   AST 28 10/08/2021   ALT 23 10/08/2021   PROT 6.8 10/08/2021   ALBUMIN 3.9 10/08/2021   CALCIUM 9.5 08/11/2021   ANIONGAP 7 06/27/2021   EGFR 53 (L) 08/11/2021   GFR 66.34 09/14/2012   Lab Results  Component Value Date   CHOL 164 10/08/2021   Lab Results  Component Value Date   HDL 53 10/08/2021   Lab Results  Component Value Date   LDLCALC 98 10/08/2021   Lab Results  Component Value Date   TRIG 68 10/08/2021   Lab Results  Component Value Date   CHOLHDL 3.1 10/08/2021   Lab Results  Component Value Date   HGBA1C 6.2 (H) 02/17/2021       Assessment & Plan:  1. Headache above the eye region Patient neurologically intact at time of exam.  There is moderate tenderness with palpation of the right maxillary and  frontal region of the face.  Likely related to sinus infection.  I did advise patient seek emergency care if facial drooping or unilateral weakness develops.  She voiced understanding and agreement with this plan.  2. Acute non-recurrent pansinusitis Will treat patient with amoxicillin 500 mg twice daily for next 7 days.  Advised Flonase, 1 spray daily nostril.  Advised her to take Tylenol and/or ibuprofen to relieve headache - amoxicillin (AMOXIL) 500 MG capsule; Take 1 capsule (500 mg total) by mouth 2 (two) times daily for 7 days.  Dispense: 14 capsule; Refill: 0  3. Vasomotor rhinitis Add Flonase nasal spray.  Patient will use 1 spray in both nostrils daily. - fluticasone (FLONASE) 50 MCG/ACT nasal spray; Place 1 spray into both nostrils daily.  Dispense: 16 g; Refill: 1   Problem List Items Addressed This Visit       Respiratory   Acute non-recurrent pansinusitis   Relevant Medications   amoxicillin (AMOXIL) 500 MG capsule   fluticasone (FLONASE) 50 MCG/ACT nasal spray   Vasomotor rhinitis   Relevant Medications   fluticasone (FLONASE) 50 MCG/ACT nasal spray     Other   Headache above the eye region - Primary     Meds ordered this encounter  Medications   amoxicillin (AMOXIL) 500 MG capsule    Sig: Take 1 capsule (500 mg total) by mouth 2 (two) times daily for 7 days.    Dispense:  14 capsule    Refill:  0    Order Specific Question:   Supervising Provider    Answer:   Beatrice Lecher D [[id number]]   fluticasone (FLONASE) 50 MCG/ACT nasal spray    Sig: Place 1 spray into both nostrils daily.    Dispense:  16 g    Refill:  1    Order Specific Question:   Supervising Provider    Answer:   Beatrice Lecher D [[id number]]      Ronnell Freshwater, NP  This note was dictated using Dragon Voice Recognition Software. Rapid proofreading was performed to expedite the delivery of the information. Despite proofreading, phonetic errors will occur which are common with this voice  recognition software. Please take this into consideration. If there are any concerns, please contact our office.

## 2021-11-06 ENCOUNTER — Other Ambulatory Visit: Payer: Self-pay | Admitting: Physician Assistant

## 2021-11-06 DIAGNOSIS — Z1231 Encounter for screening mammogram for malignant neoplasm of breast: Secondary | ICD-10-CM

## 2021-12-06 ENCOUNTER — Other Ambulatory Visit: Payer: Self-pay | Admitting: Physician Assistant

## 2021-12-06 DIAGNOSIS — I10 Essential (primary) hypertension: Secondary | ICD-10-CM

## 2021-12-17 ENCOUNTER — Ambulatory Visit
Admission: RE | Admit: 2021-12-17 | Discharge: 2021-12-17 | Disposition: A | Discharge status: Home / Self Care | Payer: Medicare PPO | Source: Ambulatory Visit | Attending: Physician Assistant | Admitting: Physician Assistant

## 2021-12-17 DIAGNOSIS — Z1231 Encounter for screening mammogram for malignant neoplasm of breast: Secondary | ICD-10-CM | POA: Diagnosis not present

## 2021-12-18 ENCOUNTER — Other Ambulatory Visit: Payer: Self-pay

## 2021-12-18 ENCOUNTER — Encounter: Payer: Self-pay | Admitting: Physician Assistant

## 2021-12-18 ENCOUNTER — Ambulatory Visit (INDEPENDENT_AMBULATORY_CARE_PROVIDER_SITE_OTHER): Payer: Medicare PPO | Admitting: Physician Assistant

## 2021-12-18 VITALS — BP 111/68 | HR 90 | Temp 98.1°F | Ht 61.0 in | Wt 124.0 lb

## 2021-12-18 DIAGNOSIS — R051 Acute cough: Secondary | ICD-10-CM | POA: Diagnosis not present

## 2021-12-18 LAB — POCT RESPIRATORY SYNCYTIAL VIRUS: RSV Rapid Ag: NEGATIVE

## 2021-12-18 LAB — POCT INFLUENZA A/B
Influenza A, POC: NEGATIVE
Influenza B, POC: NEGATIVE

## 2021-12-18 MED ORDER — PREDNISONE 20 MG PO TABS: ORAL_TABLET | ORAL | 0 refills | Status: DC

## 2021-12-18 NOTE — Patient Instructions (Signed)

## 2021-12-18 NOTE — Progress Notes (Signed)
Acute Office Visit  Subjective:    Patient ID: Kim Lane, female    DOB: 08-28-1934, 87 y.o.   MRN: [id number]  Chief Complaint  Patient presents with   Acute Visit   Cough    HPI Patient is in today for c/o cough and chest congestion which started last night. States is feeling some better, has not had a coughing spell today. Reports chest and back are sore from heavy cough last night. Also reports runny nose and nasal congestion with green drainage which has been intermittent. Denies fever, chest pain, sore throat, palpitations, chills, headache, sinus pressure, n/v/d. Has taken Tylenol.    Past Medical History:  Diagnosis Date   Anemia    Anginal pain ([place])    occ   Arthritis    Blood dyscrasia 12/2016   hodgins lymphoma tx-remission   CAD (coronary artery disease) 02/2006   Taxus stent placed to LAD and Diagonal per Dr. Olevia Perches   Cataracts, bilateral    Complication of anesthesia    HTN (hypertension)    Hypercholesterolemia    Hypothyroidism    IBS (irritable bowel syndrome)    Osteoarthritis    PONV (postoperative nausea and vomiting)    Stroke ([place]) 03/2016    Past Surgical History:  Procedure Laterality Date   APPENDECTOMY     IR GENERIC HISTORICAL  01/25/2017   IR US GUIDE VASC ACCESS RIGHT 01/25/2017 [name], MD [facility]   IR GENERIC HISTORICAL  01/25/2017   IR FLUORO GUIDE PORT INSERTION RIGHT 01/25/2017 [name], MD [facility]   IR REMOVAL TUN ACCESS W/ PORT W/O FL MOD SED  10/28/2017   KYPHOPLASTY N/A 06/24/2017   Procedure: KYPHOPLASTY T10;  Surgeon: Melina Schools, MD;  Location: [facility];  Service: Orthopedics;  Laterality: N/A;  90 mins   LEFT HEART CATH AND CORONARY ANGIOGRAPHY N/A 06/16/2017   Procedure: Left Heart Cath and Coronary Angiography;  Surgeon: Sherren Mocha, MD;  Location: [facility];  Service: Cardiovascular;  Laterality: N/A;   MASS EXCISION Right 12/22/2016   Procedure: RIGHT NECK LYMPH NODE BIOPSY;  Surgeon:  Rozetta Nunnery, MD;  Location: [facility];  Service: ENT;  Laterality: Right;   SPINE SURGERY     stents     in heart   TONSILLECTOMY     TOTAL KNEE ARTHROPLASTY Bilateral 2011,2012   VAGINAL HYSTERECTOMY      Family History  Problem Relation Age of Onset   Diabetes Mother    Dementia Mother    ALS Mother    Heart Problems Father    Liver disease Sister    Cirrhosis Sister    Heart block Other        CABG   Diabetes Sister    Heart block Son        CABG   Other Brother        GOOD HEALTH   Other Daughter        GOOD HEALTH   Dementia Sister    Breast cancer Neg Hx     Social History   Socioeconomic History   Marital status: Widowed    Spouse name: Not on file   Number of children: 2   Years of education: 12   Highest education level: Not on file  Occupational History    Employer: [employer], retired  Tobacco Use   Smoking status: Former   Smokeless tobacco: Never   Tobacco  comments:    quit smoking 30 years ago, very light smoker  Vaping Use   Vaping Use: Never used  Substance and Sexual Activity   Alcohol use: No    Alcohol/week: 0.0 standard drinks   Drug use: No   Sexual activity: Not on file  Other Topics Concern   Not on file  Social History Narrative   08/29/19 Lives alone   caffeine use- coffee- 5 cups daily   Social Determinants of Health   Financial Resource Strain: Not on file  Food Insecurity: Not on file  Transportation Needs: Not on file  Physical Activity: Not on file  Stress: Not on file  Social Connections: Not on file  Intimate Partner Violence: Not on file    Outpatient Medications Prior to Visit  Medication Sig Dispense Refill   aspirin 81 MG tablet Take 81 mg by mouth every other day.     carvedilol (COREG) 3.125 MG tablet TAKE 1 TABLET BY MOUTH EVERY OTHER MORNING 45 tablet 1   Cholecalciferol (HM VITAMIN D3) 100 MCG (4000 UT) CAPS 4,000 IU QD 30 capsule    diclofenac  Sodium (VOLTAREN) 1 % GEL Apply 2 g topically 4 (four) times daily. To affected joint for pain prn 100 g 0   fluticasone (FLONASE) 50 MCG/ACT nasal spray Place 1 spray into both nostrils daily. 16 g 1   levothyroxine (SYNTHROID) 112 MCG tablet TAKE 1 TABLET BY MOUTH DAILY 90 tablet 0   Melatonin 2.5 MG CAPS Take 1 capsule by mouth at bedtime.     Multiple Vitamins-Minerals (CENTRUM SILVER PO) Take 1 tablet by mouth daily.     nitroGLYCERIN (NITROSTAT) 0.4 MG SL tablet Place 1 tablet (0.4 mg total) under the tongue every 5 (five) minutes as needed for chest pain. 30 tablet 0   Omega-3 Fatty Acids (FISH OIL) 1000 MG CAPS Take 1 capsule by mouth daily.     PROAIR HFA 108 (90 Base) MCG/ACT inhaler Inhale 2 puffs into the lungs every 4 (four) hours as needed for shortness of breath. 18 g 1   RESTASIS 0.05 % ophthalmic emulsion Place 2 drops into both eyes 2 (two) times daily. For dry eyes     rosuvastatin (CRESTOR) 10 MG tablet Take 1 tablet (10 mg total) by mouth at bedtime. 90 tablet 3   triamcinolone cream (KENALOG) 0.1 % Apply 1 application topically 2 (two) times daily. 60 g 0   No facility-administered medications prior to visit.    Allergies  Allergen Reactions   Fosamax [Alendronate Sodium] Other (See Comments)    "made me ache all over"    Review of Systems Review of Systems:  A fourteen system review of systems was performed and found to be positive as per HPI.    Objective:    Physical Exam General:  Well Developed, well nourished, appropriate for stated age.  Neuro:  Alert and oriented,  extra-ocular muscles intact  HEENT:  Normocephalic, atraumatic, no sinus tenderness, PERRL, normal TM's of both ears, slightly boggy turbinates, normal posterior oropharynx, neck supple, +cervical adenopathy (submandibular gland b/l) Skin:  no gross rash, warm, pink. Cardiac:  RRR, S1 S2 Respiratory:  CTA B/L w/o wheezing, crackles or rales.  Chest: normal excursion, tenderness to palpation  of chest wall and ribcage Vascular:  Ext warm, no cyanosis apprec.; cap RF less 2 sec. Psych:  No HI/SI, judgement and insight good, Euthymic mood. Full Affect.  BP 111/68    Pulse 90    Temp 98.1 F (  36.7 C)    Ht _0  (1.549 m)    Wt 124 lb (56.2 kg)    SpO2 98%    BMI 23.43 kg/m  Wt Readings from Last 3 Encounters:  12/18/21 124 lb (56.2 kg)  10/29/21 127 lb 1.3 oz (57.6 kg)  10/17/21 126 lb (57.2 kg)    Health Maintenance Due  Topic Date Due   Zoster Vaccines- Shingrix (1 of 2) Never done   Pneumonia Vaccine 71+ Years old (2 - PPSV23 if available, else PCV20) 09/08/2017   COVID-19 Vaccine (3 - Pfizer risk series) 03/19/2020   INFLUENZA VACCINE  07/21/2021    There are no preventive care reminders to display for this patient.   Lab Results  Component Value Date   TSH 4.190 08/11/2021   Lab Results  Component Value Date   WBC 7.7 08/11/2021   HGB 12.7 08/11/2021   HCT 37.9 08/11/2021   MCV 87 08/11/2021   PLT 358 08/11/2021   Lab Results  Component Value Date   NA 141 08/11/2021   K 4.2 08/11/2021   CHLORIDE 105 10/07/2017   CO2 24 08/11/2021   GLUCOSE 83 08/11/2021   BUN 14 08/11/2021   CREATININE 1.04 (H) 08/11/2021   BILITOT 0.4 10/08/2021   ALKPHOS 65 10/08/2021   AST 28 10/08/2021   ALT 23 10/08/2021   PROT 6.8 10/08/2021   ALBUMIN 3.9 10/08/2021   CALCIUM 9.5 08/11/2021   ANIONGAP 7 06/27/2021   EGFR 53 (L) 08/11/2021   GFR 66.34 09/14/2012   Lab Results  Component Value Date   CHOL 164 10/08/2021   Lab Results  Component Value Date   HDL 53 10/08/2021   Lab Results  Component Value Date   LDLCALC 98 10/08/2021   Lab Results  Component Value Date   TRIG 68 10/08/2021   Lab Results  Component Value Date   CHOLHDL 3.1 10/08/2021   Lab Results  Component Value Date   HGBA1C 6.2 (H) 02/17/2021       Assessment & Plan:   Problem List Items Addressed This Visit   None Visit Diagnoses     Acute cough    -  Primary   Relevant  Medications   predniSONE (DELTASONE) 20 MG tablet   Other Relevant Orders   POCT respiratory syncytial virus (Completed)   POCT Influenza A/B (Completed)   Novel Coronavirus, NAA ([facility])      Influenza and RSV test resulted negative, COVID-19 test pending. Symptoms started <24 hours ago so possibly viral.  Patient afebrile and no s/sx concerning for bacterial respiratory infection present at this time so will defer starting antibiotic therapy.  Advised patient to let me know if symptoms worsen or fail to improve or develops new symptoms such as fever, shortness of breath or wheezing.  Discussed chest wall pain likely muscular strain secondary to coughing.  Will start corticosteroid therapy.  Patient reports cough is better today and prefers to monitor symptoms before considering Tessalon Perles. Recommend to continue home supportive care and nasal rinses with saline.   Meds ordered this encounter  Medications   predniSONE (DELTASONE) 20 MG tablet    Sig: Take 2 tablets by mouth x 2 days, 1 tablets x 2 days, 0.5 tablet x 2 days    Dispense:  7 tablet    Refill:  0    Order Specific Question:   Supervising Provider    Answer:   Beatrice Lecher D [[id number]]   Note:  This note was  prepared with assistance of Systems analyst. Occasional wrong-word or sound-a-like substitutions may have occurred due to the inherent limitations of voice recognition software.   Lorrene Reid, PA-C

## 2021-12-19 LAB — NOVEL CORONAVIRUS, NAA: SARS-CoV-2, NAA: NOT DETECTED

## 2021-12-19 LAB — SARS-COV-2, NAA 2 DAY TAT

## 2021-12-25 ENCOUNTER — Other Ambulatory Visit: Payer: Self-pay

## 2021-12-25 DIAGNOSIS — M810 Age-related osteoporosis without current pathological fracture: Secondary | ICD-10-CM

## 2021-12-26 ENCOUNTER — Inpatient Hospital Stay: Payer: Medicare PPO

## 2021-12-26 ENCOUNTER — Inpatient Hospital Stay: Payer: Medicare PPO | Admitting: Hematology

## 2021-12-26 ENCOUNTER — Other Ambulatory Visit: Payer: Self-pay

## 2021-12-26 ENCOUNTER — Inpatient Hospital Stay: Payer: Medicare PPO | Attending: Hematology

## 2021-12-26 VITALS — BP 154/83 | HR 72 | Temp 97.9°F | Resp 18 | Wt 124.7 lb

## 2021-12-26 DIAGNOSIS — C8111 Nodular sclerosis classical Hodgkin lymphoma, lymph nodes of head, face, and neck: Secondary | ICD-10-CM | POA: Insufficient documentation

## 2021-12-26 DIAGNOSIS — Z818 Family history of other mental and behavioral disorders: Secondary | ICD-10-CM | POA: Insufficient documentation

## 2021-12-26 DIAGNOSIS — Z8249 Family history of ischemic heart disease and other diseases of the circulatory system: Secondary | ICD-10-CM | POA: Diagnosis not present

## 2021-12-26 DIAGNOSIS — Z79899 Other long term (current) drug therapy: Secondary | ICD-10-CM | POA: Insufficient documentation

## 2021-12-26 DIAGNOSIS — Z8379 Family history of other diseases of the digestive system: Secondary | ICD-10-CM | POA: Insufficient documentation

## 2021-12-26 DIAGNOSIS — N1831 Chronic kidney disease, stage 3a: Secondary | ICD-10-CM | POA: Diagnosis not present

## 2021-12-26 DIAGNOSIS — M810 Age-related osteoporosis without current pathological fracture: Secondary | ICD-10-CM | POA: Insufficient documentation

## 2021-12-26 DIAGNOSIS — Z833 Family history of diabetes mellitus: Secondary | ICD-10-CM | POA: Insufficient documentation

## 2021-12-26 DIAGNOSIS — C8190 Hodgkin lymphoma, unspecified, unspecified site: Secondary | ICD-10-CM

## 2021-12-26 DIAGNOSIS — D509 Iron deficiency anemia, unspecified: Secondary | ICD-10-CM | POA: Diagnosis not present

## 2021-12-26 DIAGNOSIS — Z95828 Presence of other vascular implants and grafts: Secondary | ICD-10-CM

## 2021-12-26 LAB — CBC WITH DIFFERENTIAL (CANCER CENTER ONLY)
Abs Immature Granulocytes: 0.29 10*3/uL — ABNORMAL HIGH (ref 0.00–0.07)
Basophils Absolute: 0.1 10*3/uL (ref 0.0–0.1)
Basophils Relative: 1 %
Eosinophils Absolute: 1 10*3/uL — ABNORMAL HIGH (ref 0.0–0.5)
Eosinophils Relative: 9 %
HCT: 38.3 % (ref 36.0–46.0)
Hemoglobin: 12.3 g/dL (ref 12.0–15.0)
Immature Granulocytes: 3 %
Lymphocytes Relative: 18 %
Lymphs Abs: 1.9 10*3/uL (ref 0.7–4.0)
MCH: 28.1 pg (ref 26.0–34.0)
MCHC: 32.1 g/dL (ref 30.0–36.0)
MCV: 87.4 fL (ref 80.0–100.0)
Monocytes Absolute: 0.8 10*3/uL (ref 0.1–1.0)
Monocytes Relative: 8 %
Neutro Abs: 6.5 10*3/uL (ref 1.7–7.7)
Neutrophils Relative %: 61 %
Platelet Count: 448 10*3/uL — ABNORMAL HIGH (ref 150–400)
RBC: 4.38 MIL/uL (ref 3.87–5.11)
RDW: 15 % (ref 11.5–15.5)
WBC Count: 10.5 10*3/uL (ref 4.0–10.5)
nRBC: 0 % (ref 0.0–0.2)

## 2021-12-26 LAB — CMP (CANCER CENTER ONLY)
ALT: 19 U/L (ref 0–44)
AST: 21 U/L (ref 15–41)
Albumin: 3.8 g/dL (ref 3.5–5.0)
Alkaline Phosphatase: 62 U/L (ref 38–126)
Anion gap: 7 (ref 5–15)
BUN: 16 mg/dL (ref 8–23)
CO2: 29 mmol/L (ref 22–32)
Calcium: 9.2 mg/dL (ref 8.9–10.3)
Chloride: 102 mmol/L (ref 98–111)
Creatinine: 0.92 mg/dL (ref 0.44–1.00)
GFR, Estimated: >60 mL/min (ref >60)
Glucose, Bld: 110 mg/dL — ABNORMAL HIGH (ref 70–99)
Potassium: 4.2 mmol/L (ref 3.5–5.1)
Sodium: 138 mmol/L (ref 135–145)
Total Bilirubin: 0.5 mg/dL (ref 0.3–1.2)
Total Protein: 7.3 g/dL (ref 6.5–8.1)

## 2021-12-26 LAB — VITAMIN D 25 HYDROXY (VIT D DEFICIENCY, FRACTURES): Vit D, 25-Hydroxy: 59.93 ng/mL (ref 30–100)

## 2021-12-26 MED ORDER — SODIUM CHLORIDE 0.9 % IV SOLN: Freq: Once | INTRAVENOUS | Status: AC

## 2021-12-26 MED ORDER — ZOLEDRONIC ACID 4 MG/100ML IV SOLN
4.0000 mg | Freq: Once | INTRAVENOUS | Status: AC
  Administered 2021-12-26: 4 mg via INTRAVENOUS
  Filled 2021-12-26: qty 100

## 2021-12-26 NOTE — Progress Notes (Signed)
Per Dr [name] pt ok for tx today.

## 2021-12-26 NOTE — Patient Instructions (Signed)
[facility]  Discharge Instructions: Thank you for choosing [facility] to provide your oncology and hematology care.   If you have a lab appointment with the Cancer Center, please go directly to the Cancer Center and check in at the registration area.   Wear comfortable clothing and clothing appropriate for easy access to any Portacath or PICC line.   We strive to give you quality time with your provider. You may need to reschedule your appointment if you arrive late (15 or more minutes).  Arriving late affects you and other patients whose appointments are after yours.  Also, if you miss three or more appointments without notifying the office, you may be dismissed from the clinic at the provider's discretion.      For prescription refill requests, have your pharmacy contact our office and allow 72 hours for refills to be completed.    Today you received the following chemotherapy and/or immunotherapy agents: Zometa      To help prevent nausea and vomiting after your treatment, we encourage you to take your nausea medication as directed.  BELOW ARE SYMPTOMS THAT SHOULD BE REPORTED IMMEDIATELY: *FEVER GREATER THAN 100.4 F (38 C) OR HIGHER *CHILLS OR SWEATING *NAUSEA AND VOMITING THAT IS NOT CONTROLLED WITH YOUR NAUSEA MEDICATION *UNUSUAL SHORTNESS OF BREATH *UNUSUAL BRUISING OR BLEEDING *URINARY PROBLEMS (pain or burning when urinating, or frequent urination) *BOWEL PROBLEMS (unusual diarrhea, constipation, pain near the anus) TENDERNESS IN MOUTH AND THROAT WITH OR WITHOUT PRESENCE OF ULCERS (sore throat, sores in mouth, or a toothache) UNUSUAL RASH, SWELLING OR PAIN  UNUSUAL VAGINAL DISCHARGE OR ITCHING   Items with * indicate a potential emergency and should be followed up as soon as possible or go to the Emergency Department if any problems should occur.  Please show the CHEMOTHERAPY ALERT CARD or IMMUNOTHERAPY ALERT CARD at check-in to the  Emergency Department and triage nurse.  Should you have questions after your visit or need to cancel or reschedule your appointment, please contact [facility]  Dept: [phone]  and follow the prompts.  Office hours are 8:00 a.m. to 4:30 p.m. Monday - Friday. Please note that voicemails left after 4:00 p.m. may not be returned until the following business day.  We are closed weekends and major holidays. You have access to a nurse at all times for urgent questions. Please call the main number to the clinic Dept: [phone] and follow the prompts.   For any non-urgent questions, you may also contact your provider using MyChart. We now offer e-Visits for anyone 18 and older to request care online for non-urgent symptoms. For details visit [email or web address].   Also download the MyChart app! Go to the app store, search "MyChart", open the app, select [facility], and log in with your MyChart username and password.  Due to Covid, a mask is required upon entering the hospital/clinic. If you do not have a mask, one will be given to you upon arrival. For doctor visits, patients may have 1 support person aged 18 or older with them. For treatment visits, patients cannot have anyone with them due to current Covid guidelines and our immunocompromised population.  

## 2021-12-27 ENCOUNTER — Other Ambulatory Visit: Payer: Self-pay | Admitting: Physician Assistant

## 2021-12-27 DIAGNOSIS — I10 Essential (primary) hypertension: Secondary | ICD-10-CM | POA: Diagnosis not present

## 2021-12-27 DIAGNOSIS — I499 Cardiac arrhythmia, unspecified: Secondary | ICD-10-CM | POA: Diagnosis not present

## 2021-12-27 DIAGNOSIS — E039 Hypothyroidism, unspecified: Secondary | ICD-10-CM

## 2021-12-27 DIAGNOSIS — G8929 Other chronic pain: Secondary | ICD-10-CM | POA: Diagnosis not present

## 2021-12-27 DIAGNOSIS — J45909 Unspecified asthma, uncomplicated: Secondary | ICD-10-CM | POA: Diagnosis not present

## 2021-12-27 DIAGNOSIS — R32 Unspecified urinary incontinence: Secondary | ICD-10-CM | POA: Diagnosis not present

## 2021-12-27 DIAGNOSIS — M810 Age-related osteoporosis without current pathological fracture: Secondary | ICD-10-CM | POA: Diagnosis not present

## 2021-12-27 DIAGNOSIS — I25119 Atherosclerotic heart disease of native coronary artery with unspecified angina pectoris: Secondary | ICD-10-CM | POA: Diagnosis not present

## 2021-12-27 DIAGNOSIS — E785 Hyperlipidemia, unspecified: Secondary | ICD-10-CM | POA: Diagnosis not present

## 2021-12-29 ENCOUNTER — Telehealth: Payer: Self-pay | Admitting: Hematology

## 2021-12-29 NOTE — Telephone Encounter (Signed)
Scheduled follow-up appointments per 1/6 los. Patient is aware.

## 2022-01-01 ENCOUNTER — Encounter: Payer: Self-pay | Admitting: Hematology

## 2022-01-01 NOTE — Progress Notes (Addendum)
HEMATOLOGY/ONCOLOGY CLINIC NOTE  Date of Service: .12/26/2021  Patient Care Team: Lorrene Reid, PA-C as PCP - General Josue Hector, MD as PCP - Cardiology (Cardiology) Josue Hector, MD as Consulting Physician (Cardiology) Penni Bombard, MD as Consulting Physician (Neurology) Jarome Matin, MD as Consulting Physician (Dermatology) Rozetta Nunnery, MD (Inactive) as Consulting Physician (Otolaryngology) Brunetta Genera, MD as Consulting Physician (Hematology) Melina Schools, MD as Consulting Physician (Orthopedic Surgery) Renette Butters, MD as Attending Physician (Orthopedic Surgery)  CHIEF COMPLAINTS/PURPOSE OF CONSULTATION:   Follow-up for continued surveillance of Hodgkin's lymphoma Management of osteoporosis with Zometa  HISTORY OF PRESENTING ILLNESS:   plz see previous   INTERVAL HISTORY:  Kim Lane is here for her 48-month surveillance visit for Hodgkin's lymphoma and negative dose of Zometa for osteoporosis. She notes no acute new concerns over the last year with regards to her lymphoma. No new lumps or bumps. No new skin rashes or pruritus. No new fatigue or unexpected weight loss. No fevers no chills no night sweats. No other acute new focal symptoms. No new dental issues that would preclude use of continue Zometa. No significant toxicities from her previous dose of Zometa.  MEDICAL HISTORY:  Past Medical History:  Diagnosis Date   Anemia    Anginal pain ([place])    occ   Arthritis    Blood dyscrasia 12/2016   hodgins lymphoma tx-remission   CAD (coronary artery disease) 02/2006   Taxus stent placed to LAD and Diagonal per Dr. Olevia Perches   Cataracts, bilateral    Complication of anesthesia    HTN (hypertension)    Hypercholesterolemia    Hypothyroidism    IBS (irritable bowel syndrome)    Osteoarthritis    PONV (postoperative nausea and vomiting)    Stroke ([place]) 03/2016  Osteoporosis Cardiologist Dr. [name] -Dr.  Earlean Shawl  SURGICAL HISTORY: Past Surgical History:  Procedure Laterality Date   APPENDECTOMY     IR GENERIC HISTORICAL  01/25/2017   IR US GUIDE VASC ACCESS RIGHT 01/25/2017 [name], MD [facility]   IR GENERIC HISTORICAL  01/25/2017   IR FLUORO GUIDE PORT INSERTION RIGHT 01/25/2017 [name], MD [facility]   IR REMOVAL TUN ACCESS W/ PORT W/O FL MOD SED  10/28/2017   KYPHOPLASTY N/A 06/24/2017   Procedure: KYPHOPLASTY T10;  Surgeon: Melina Schools, MD;  Location: [facility];  Service: Orthopedics;  Laterality: N/A;  90 mins   LEFT HEART CATH AND CORONARY ANGIOGRAPHY N/A 06/16/2017   Procedure: Left Heart Cath and Coronary Angiography;  Surgeon: Sherren Mocha, MD;  Location: [facility];  Service: Cardiovascular;  Laterality: N/A;   MASS EXCISION Right 12/22/2016   Procedure: RIGHT NECK LYMPH NODE BIOPSY;  Surgeon: Rozetta Nunnery, MD;  Location: [facility];  Service: ENT;  Laterality: Right;   SPINE SURGERY     stents     in heart   TONSILLECTOMY     TOTAL KNEE ARTHROPLASTY Bilateral 2011,2012   VAGINAL HYSTERECTOMY      SOCIAL HISTORY: Social History   Socioeconomic History   Marital status: Widowed    Spouse name: Not on file   Number of children: 2   Years of education: 12   Highest education level: Not on file  Occupational History    Employer: [employer], retired  Tobacco Use   Smoking status: Former   Smokeless tobacco: Never   Tobacco comments:  quit smoking 30 years ago, very light smoker  Vaping Use   Vaping Use: Never used  Substance and Sexual Activity   Alcohol use: No    Alcohol/week: 0.0 standard drinks   Drug use: No   Sexual activity: Not on file  Other Topics Concern   Not on file  Social History Narrative   08/29/19 Lives alone   caffeine use- coffee- 5 cups daily   Social Determinants of Health   Financial Resource Strain: Not on file  Food Insecurity: Not on file  Transportation  Needs: Not on file  Physical Activity: Not on file  Stress: Not on file  Social Connections: Not on file  Intimate Partner Violence: Not on file    FAMILY HISTORY: Family History  Problem Relation Age of Onset   Diabetes Mother    Dementia Mother    ALS Mother    Heart Problems Father    Liver disease Sister    Cirrhosis Sister    Heart block Other        CABG   Diabetes Sister    Heart block Son        CABG   Other Brother        GOOD HEALTH   Other Daughter        GOOD HEALTH   Dementia Sister    Breast cancer Neg Hx     ALLERGIES:  is allergic to fosamax [alendronate sodium].  MEDICATIONS:  Current Outpatient Medications  Medication Sig Dispense Refill   aspirin 81 MG tablet Take 81 mg by mouth every other day.     carvedilol (COREG) 3.125 MG tablet TAKE 1 TABLET BY MOUTH EVERY OTHER MORNING 45 tablet 1   Cholecalciferol (HM VITAMIN D3) 100 MCG (4000 UT) CAPS 4,000 IU QD 30 capsule    diclofenac Sodium (VOLTAREN) 1 % GEL Apply 2 g topically 4 (four) times daily. To affected joint for pain prn 100 g 0   fluticasone (FLONASE) 50 MCG/ACT nasal spray Place 1 spray into both nostrils daily. 16 g 1   levothyroxine (SYNTHROID) 112 MCG tablet TAKE 1 TABLET BY MOUTH DAILY 90 tablet 1   Melatonin 2.5 MG CAPS Take 1 capsule by mouth at bedtime.     Multiple Vitamins-Minerals (CENTRUM SILVER PO) Take 1 tablet by mouth daily.     nitroGLYCERIN (NITROSTAT) 0.4 MG SL tablet Place 1 tablet (0.4 mg total) under the tongue every 5 (five) minutes as needed for chest pain. 30 tablet 0   Omega-3 Fatty Acids (FISH OIL) 1000 MG CAPS Take 1 capsule by mouth daily.     predniSONE (DELTASONE) 20 MG tablet Take 2 tablets by mouth x 2 days, 1 tablets x 2 days, 0.5 tablet x 2 days 7 tablet 0   PROAIR HFA 108 (90 Base) MCG/ACT inhaler Inhale 2 puffs into the lungs every 4 (four) hours as needed for shortness of breath. 18 g 1   RESTASIS 0.05 % ophthalmic emulsion Place 2 drops into both eyes 2  (two) times daily. For dry eyes     rosuvastatin (CRESTOR) 10 MG tablet Take 1 tablet (10 mg total) by mouth at bedtime. 90 tablet 3   triamcinolone cream (KENALOG) 0.1 % Apply 1 application topically 2 (two) times daily. 60 g 0   No current facility-administered medications for this visit.    REVIEW OF SYSTEMS:  .10 Point review of Systems was done is negative except as noted above.  PHYSICAL EXAMINATION:  ECOG PERFORMANCE STATUS: 2 -  Symptomatic, <50% confined to bed  Vitals:   12/26/21 1318  BP: (!) 154/83  Pulse: 72  Resp: 18  Temp: 97.9 F (36.6 C)  SpO2: 99%  Weight: 124 lb 11.2 oz (56.6 kg)  . GENERAL:alert, in no acute distress and comfortable SKIN: no acute rashes, no significant lesions EYES: conjunctiva are pink and non-injected, sclera anicteric OROPHARYNX: MMM, no exudates, no oropharyngeal erythema or ulceration NECK: supple, no JVD LYMPH:  no palpable lymphadenopathy in the cervical, axillary or inguinal regions LUNGS: clear to auscultation b/l with normal respiratory effort HEART: regular rate & rhythm ABDOMEN:  normoactive bowel sounds , non tender, not distended. Extremity: no pedal edema PSYCH: alert & oriented x 3 with fluent speech NEURO: no focal motor/sensory deficits   LABORATORY DATA:  I have reviewed the data as listed   . CBC Latest Ref Rng & Units 12/26/2021 08/11/2021 06/27/2021  WBC 4.0 - 10.5 K/uL 10.5 7.7 7.4  Hemoglobin 12.0 - 15.0 g/dL 12.3 12.7 12.7  Hematocrit 36.0 - 46.0 % 38.3 37.9 38.3  Platelets 150 - 400 K/uL 448(H) 358 337   .CBC    Component Value Date/Time   WBC 10.5 12/26/2021 1203   WBC 7.4 06/27/2021 1050   RBC 4.38 12/26/2021 1203   HGB 12.3 12/26/2021 1203   HGB 12.7 08/11/2021 1629   HGB 11.6 10/07/2017 1115   HCT 38.3 12/26/2021 1203   HCT 37.9 08/11/2021 1629   HCT 35.9 10/07/2017 1115   PLT 448 (H) 12/26/2021 1203   PLT 358 08/11/2021 1629   MCV 87.4 12/26/2021 1203   MCV 87 08/11/2021 1629   MCV 88.0  10/07/2017 1115   MCH 28.1 12/26/2021 1203   MCHC 32.1 12/26/2021 1203   RDW 15.0 12/26/2021 1203   RDW 13.0 08/11/2021 1629   RDW 15.0 (H) 10/07/2017 1115   LYMPHSABS 1.9 12/26/2021 1203   LYMPHSABS 1.4 08/11/2021 1629   LYMPHSABS 1.4 10/07/2017 1115   MONOABS 0.8 12/26/2021 1203   MONOABS 0.5 10/07/2017 1115   EOSABS 1.0 (H) 12/26/2021 1203   EOSABS 1.0 (H) 08/11/2021 1629   BASOSABS 0.1 12/26/2021 1203   BASOSABS 0.1 08/11/2021 1629   BASOSABS 0.1 10/07/2017 1115    . CMP Latest Ref Rng & Units 12/26/2021 10/08/2021 08/11/2021  Glucose 70 - 99 mg/dL 110(H) - 83  BUN 8 - 23 mg/dL 16 - 14  Creatinine 0.44 - 1.00 mg/dL 0.92 - 1.04(H)  Sodium 135 - 145 mmol/L 138 - 141  Potassium 3.5 - 5.1 mmol/L 4.2 - 4.2  Chloride 98 - 111 mmol/L 102 - 100  CO2 22 - 32 mmol/L 29 - 24  Calcium 8.9 - 10.3 mg/dL 9.2 - 9.5  Total Protein 6.5 - 8.1 g/dL 7.3 6.8 7.1  Total Bilirubin 0.3 - 1.2 mg/dL 0.5 0.4 0.3  Alkaline Phos 38 - 126 U/L 62 65 72  AST 15 - 41 U/L 21 28 38  ALT 0 - 44 U/L 19 23 23    Erythrocyte Sedimentation Rate     Component Value Date/Time   ESRSEDRATE 22 12/04/2020 0920   ESRSEDRATE 20 10/07/2017 1115    RADIOGRAPHIC STUDIES: I have personally reviewed the radiological images as listed and agreed with the findings in the report. MM 3D SCREEN BREAST BILATERAL  Result Date: 12/18/2021 CLINICAL DATA:  Screening. EXAM: DIGITAL SCREENING BILATERAL MAMMOGRAM WITH TOMOSYNTHESIS AND CAD TECHNIQUE: Bilateral screening digital craniocaudal and mediolateral oblique mammograms were obtained. Bilateral screening digital breast tomosynthesis was performed. The images were evaluated  with computer-aided detection. COMPARISON:  Previous exam(s). ACR Breast Density Category b: There are scattered areas of fibroglandular density. FINDINGS: There are no findings suspicious for malignancy. IMPRESSION: No mammographic evidence of malignancy. A result letter of this screening mammogram will be  mailed directly to the patient. RECOMMENDATION: Screening mammogram in one year. (Code:SM-B-01Y) BI-RADS CATEGORY  1: Negative. Electronically Signed   By: Abelardo Diesel M.D.   On: 12/18/2021 07:27     ASSESSMENT & PLAN:   88 y.o. Caucasian female with ECOG performance status of 2 with  1) history of classical Hodgkin's lymphoma of nodular sclerosis variety At least Stage IIIB some concern for possible Rt lung involvement which makes it Stage IVB Presented with right neck lymph nodes . She had type B constitutional symptoms with 15-20 pound weight loss night sweats or chills .noted to have significant pruritus with mild rash likely from Hodgkin's which remain resolved  Wt Readings from Last 3 Encounters:  12/26/21 124 lb 11.2 oz (56.6 kg)  12/18/21 124 lb (56.2 kg)  10/29/21 127 lb 1.3 oz (57.6 kg)  PET/CT scan after 5 cycles shows marked response to chemotherapy and stable predominantly [place] 2 as per discussion with the radiology.  S/p 5 cycles of AVD with last Cycle of AVD on 05/19/17  PLAN:  -Discussed patient's labs with her in detail.  CBC with normal hemoglobin and WBC count.  Slightly elevated platelet counts at 448k elevated platelets likely reactive.  She had platelets of 421k on 12/04/2020 which had resolved to normal levels subsequently. -Would recommend monitoring platelets with primary care physician on next labs and 3 to 6 months. -CMP stable -No clinical or lab evidence of Hodgkin's lymphoma recurrence/progression at this time  3) T10 compression fracture based on imaging done on 05/22/2017 in ED Now status post kyphoplasty with significant improvement in her back pain  4) Osteoporosis PET/CT scan also shows possible L4 compression fracture . These compression fractures appear to be related to osteoporosis . Also had rib fracture related to no significant trauma She was previously on Fosamax that cause generalized body aches . PLAN:  -25-hydroxy vitamin D levels at  60. -Continue her current dose of vitamin D she is taking about 5000 units daily of D3 -Continue with Zometa every 6 months (insurance declined Prolia) -Continue to optimize thyroid replacement with primary care physician. -Fall precautions  5) h/o triple-vessel coronary artery disease based on cardiac cath: patient is surprisingly asymptomatic. -Continue management as per PCP and cardiology.  6) . Patient Active Problem List   Diagnosis Date Noted   Headache above the eye region 10/29/2021   Acute non-recurrent pansinusitis 10/29/2021   Vasomotor rhinitis 10/29/2021   Stage 3a chronic kidney disease ([place]) 12/12/2019   Degenerative disc disease, lumbar- MRI 6/ 2018 08/03/2019   Neuropathy involving both lower extremities- due to DDD of L-Spine 08/03/2019   Elevated serum creatinine 08/03/2019   Prediabetes 06/20/2019   Hyperlipidemia 06/20/2019   History of CVA (cerebrovascular accident)- 2017-   05/31/2019   Syncope and collapse 05/31/2019   Blistering rash-new onset right palmar surface hand- at base esp btwn fingers 05/31/2019   Maceration of skin 05/31/2019   Degeneration of lumbar intervertebral disc 04/11/2019   Lumbar radiculopathy 04/11/2019   Pain of both hip joints 03/17/2019   Localized, primary osteoarthritis 12/07/2018   Cramps, muscle, general-mostly bilateral lower extremities, worse at night but not just nocturnal 12/07/2018   Pain in finger of right hand 11/30/2018   Sleep difficulties 09/11/2018  Muscle cramps at night 08/10/2018   Arthritis 08/10/2018   Dehydration, mild 08/10/2018   S/P kyphoplasty- dr Rolena Infante- ortho 04/05/2018   Postmenopausal 04/05/2018   Closed fracture of multiple ribs 04/05/2018   Allergic contact dermatitis due to cosmetics 01/26/2018   Trigger finger, acquired- R 3rd digit 01/26/2018   Osteoarthritis of finger 01/26/2018   Vitamin D deficiency 11/24/2017   Glucose intolerance (impaired glucose tolerance) 11/24/2017    Osteoarthritis 09/16/2017   History of iron deficiency anemia-  by ONC 07/23/2017   Osteoporosis 07/23/2017   CAD S/P percutaneous coronary angioplasty 07/02/2017   Closed compression fracture of L2 lumbar vertebra 06/24/2017   Abnormal nuclear cardiac imaging test 06/16/2017   Malnutrition of moderate degree 04/27/2017   Symptomatic anemia 04/26/2017   Hodgkin lymphoma ([place]) 04/26/2017   Port catheter in place 02/02/2017   Nodular sclerosis Hodgkin lymphoma of lymph nodes of neck ([place]) 01/13/2017   Dyspnea 09/14/2012   Epistaxis 06/02/2011   HYPERCHOLESTEROLEMIA, MIXED 01/08/2009   Essential hypertension, benign 01/08/2009   Coronary atherosclerosis- sees Dr. Johnsie Cancel 01/08/2009   Hypothyroidism 01/08/2009  PLAN  -Continue follow-up with primary care physician for management of other chronic medical co-morbidities.   FOLLOW UP: Labs and Zometa in 6 months RTC with Dr [name] with labs and zometa infusion in 12 months  All of the patient's questions were answered with apparent satisfaction. The patient knows to call the clinic with any problems, questions or concerns.    Sullivan Lone MD [place] AAHIVMS [facility] Hematology/Oncology Physician [facility]

## 2022-02-12 ENCOUNTER — Encounter: Payer: Self-pay | Admitting: Nurse Practitioner

## 2022-02-12 ENCOUNTER — Other Ambulatory Visit: Payer: Self-pay

## 2022-02-12 ENCOUNTER — Ambulatory Visit: Payer: Medicare PPO | Admitting: Nurse Practitioner

## 2022-02-12 VITALS — BP 123/75 | HR 84 | Temp 99.7°F | Ht 61.0 in | Wt 122.4 lb

## 2022-02-12 DIAGNOSIS — J0140 Acute pansinusitis, unspecified: Secondary | ICD-10-CM

## 2022-02-12 DIAGNOSIS — R059 Cough, unspecified: Secondary | ICD-10-CM

## 2022-02-12 LAB — POCT RAPID STREP A (OFFICE): Rapid Strep A Screen: NEGATIVE

## 2022-02-12 LAB — POCT INFLUENZA A/B
Influenza A, POC: NEGATIVE
Influenza B, POC: NEGATIVE

## 2022-02-12 MED ORDER — AMOXICILLIN 500 MG PO CAPS: 500.0000 mg | ORAL_CAPSULE | Freq: Three times a day (TID) | ORAL | 0 refills | Status: AC

## 2022-02-12 NOTE — Progress Notes (Signed)
Established patient visit   Patient: Kim Lane   DOB: [birth year]   88 y.o. Female  MRN: [id number] Visit Date: 02/12/2022  Chief Complaint  Patient presents with   Sore Throat   Subjective    Sore Throat  This is a new problem. The current episode started in the past 7 days. The problem has been gradually worsening. There has been no fever. Associated symptoms include congestion, coughing, ear pain, headaches and a hoarse voice. Pertinent negatives include no abdominal pain, diarrhea, shortness of breath or vomiting. She has tried acetaminophen for the symptoms. The treatment provided mild relief.     Medications: Outpatient Medications Prior to Visit  Medication Sig   aspirin 81 MG tablet Take 81 mg by mouth every other day.   carvedilol (COREG) 3.125 MG tablet TAKE 1 TABLET BY MOUTH EVERY OTHER MORNING   Cholecalciferol (HM VITAMIN D3) 100 MCG (4000 UT) CAPS 4,000 IU QD   diclofenac Sodium (VOLTAREN) 1 % GEL Apply 2 g topically 4 (four) times daily. To affected joint for pain prn   fluticasone (FLONASE) 50 MCG/ACT nasal spray Place 1 spray into both nostrils daily.   levothyroxine (SYNTHROID) 112 MCG tablet TAKE 1 TABLET BY MOUTH DAILY   Melatonin 2.5 MG CAPS Take 1 capsule by mouth at bedtime.   Multiple Vitamins-Minerals (CENTRUM SILVER PO) Take 1 tablet by mouth daily.   nitroGLYCERIN (NITROSTAT) 0.4 MG SL tablet Place 1 tablet (0.4 mg total) under the tongue every 5 (five) minutes as needed for chest pain.   Omega-3 Fatty Acids (FISH OIL) 1000 MG CAPS Take 1 capsule by mouth daily.   predniSONE (DELTASONE) 20 MG tablet Take 2 tablets by mouth x 2 days, 1 tablets x 2 days, 0.5 tablet x 2 days   PROAIR HFA 108 (90 Base) MCG/ACT inhaler Inhale 2 puffs into the lungs every 4 (four) hours as needed for shortness of breath.   RESTASIS 0.05 % ophthalmic emulsion Place 2 drops into both eyes 2 (two) times daily. For dry eyes   rosuvastatin (CRESTOR) 10 MG tablet Take 1 tablet (10 mg  total) by mouth at bedtime.   triamcinolone cream (KENALOG) 0.1 % Apply 1 application topically 2 (two) times daily.   No facility-administered medications prior to visit.    Review of Systems  Constitutional:  Positive for appetite change and fatigue. Negative for activity change, chills and fever.       Decreased appetite  HENT:  Positive for congestion, ear pain, hoarse voice, postnasal drip, rhinorrhea and sore throat. Negative for sinus pressure, sinus pain and sneezing.   Eyes: Negative.   Respiratory:  Positive for cough. Negative for chest tightness, shortness of breath and wheezing.   Cardiovascular:  Negative for chest pain and palpitations.  Gastrointestinal:  Negative for abdominal pain, constipation, diarrhea, nausea and vomiting.  Endocrine: Negative for cold intolerance, heat intolerance, polydipsia and polyuria.  Genitourinary:  Negative for dyspareunia, dysuria, flank pain, frequency and urgency.  Musculoskeletal:  Negative for arthralgias, back pain and myalgias.  Skin:  Negative for rash.  Allergic/Immunologic: Negative for environmental allergies.  Neurological:  Positive for headaches. Negative for dizziness and weakness.  Hematological:  Negative for adenopathy.  Psychiatric/Behavioral:  The patient is not nervous/anxious.     Objective     Today's Vitals   02/12/22 1404  BP: 123/75  Pulse: 84  Temp: 99.7 F (37.6 C)  SpO2: 98%  Weight: 122 lb 6.4 oz (55.5 kg)  Height: 5\' 1"  (1.549 m)  Body mass index is 23.13 kg/m.   Physical Exam Vitals and nursing note reviewed.  Constitutional:      Appearance: Normal appearance. She is well-developed. She is ill-appearing.  HENT:     Head: Normocephalic and atraumatic.     Right Ear: Hearing, tympanic membrane, ear canal and external ear normal.     Left Ear: Hearing, tympanic membrane, ear canal and external ear normal.     Nose: Congestion present.     Mouth/Throat:     Pharynx: Pharyngeal swelling and  posterior oropharyngeal erythema present.  Eyes:     Pupils: Pupils are equal, round, and reactive to light.  Cardiovascular:     Rate and Rhythm: Normal rate and regular rhythm.     Pulses: Normal pulses.     Heart sounds: Normal heart sounds.  Pulmonary:     Effort: Pulmonary effort is normal.     Breath sounds: Normal breath sounds.     Comments: Mild, non productive cough noted . Abdominal:     Palpations: Abdomen is soft.  Musculoskeletal:        General: Normal range of motion.     Cervical back: Normal range of motion and neck supple.  Lymphadenopathy:     Cervical: Cervical adenopathy present.  Skin:    General: Skin is warm and dry.     Capillary Refill: Capillary refill takes less than 2 seconds.  Neurological:     General: No focal deficit present.     Mental Status: She is alert and oriented to person, place, and time.  Psychiatric:        Mood and Affect: Mood normal.        Behavior: Behavior normal.        Thought Content: Thought content normal.        Judgment: Judgment normal.      Assessment & Plan     1. Acute non-recurrent pansinusitis Testing for flu and strep both negative during today's visit.  Start amoxicillin 500 mg 3 times daily for next 7 days. Rest and increase fluids. Continue using OTC medication to control symptoms.   - amoxicillin (AMOXIL) 500 MG capsule; Take 1 capsule (500 mg total) by mouth 3 (three) times daily for 7 days.  Dispense: 21 capsule; Refill: 0 - POCT Influenza A/B - POCT rapid strep A  2. Cough, unspecified type COVID-19 test sent to [facility].  Will notify patient of results when they are available.  In meantime start amoxicillin 3 times daily for 7 days. Rest and increase fluids. Continue using OTC medication to control symptoms.   - amoxicillin (AMOXIL) 500 MG capsule; Take 1 capsule (500 mg total) by mouth 3 (three) times daily for 7 days.  Dispense: 21 capsule; Refill: 0 - Novel Coronavirus, NAA ([facility]); Future - Novel  Coronavirus, NAA ([facility])   Return for prn worsening or persistent symptoms.        Ronnell Freshwater, NP  [facility] [phone] (phone) [phone] (fax)  [place]

## 2022-02-13 LAB — NOVEL CORONAVIRUS, NAA: SARS-CoV-2, NAA: NOT DETECTED

## 2022-02-15 DIAGNOSIS — R059 Cough, unspecified: Secondary | ICD-10-CM | POA: Insufficient documentation

## 2022-02-16 NOTE — Progress Notes (Signed)
Please let the patient know that her testing for COVID 19 was negative. Thanks so much.   -HB

## 2022-04-16 NOTE — Progress Notes (Signed)
?Established patient visit ? ? ?Patient: Kim Lane   DOB: 1934-09-24   88 y.o. Female  MRN: [id number] ?Visit Date: 04/17/2022 ? ?Chief Complaint  ?Patient presents with  ? Follow-up  ? Hypertension  ? Thyroid Problem  ? Hyperlipidemia  ? ?Subjective  ?  ?HPI  ?Patient presents for chronic f/up. Patient reports having trouble sleeping at night. Cannot fall asleep. On average sleeps 6 hours or less. Tries to avoid daytime naps. Drinks 3-4 cups of coffee daily.  ? ?HTN: Pt denies chest pain, palpitations, dizziness, headache, shortness of breath, or lower extremity swelling. Taking medication as directed without side effects. Has not checked blood pressure at home lately. ? ?HLD: Pt reports some nights forgets to take her cholesterol medication. Tolerates medication. No myalgia or lower extremity weakness.  ? ?Thyroid: Reports medication compliance. No fatigue, unintentional weight changes or heat/cold intolerance.  ? ?Medications: ?Outpatient Medications Prior to Visit  ?Medication Sig  ? aspirin 81 MG tablet Take 81 mg by mouth every other day.  ? carvedilol (COREG) 3.125 MG tablet TAKE 1 TABLET BY MOUTH EVERY OTHER MORNING  ? Cholecalciferol (HM VITAMIN D3) 100 MCG (4000 UT) CAPS 4,000 IU QD  ? diclofenac Sodium (VOLTAREN) 1 % GEL Apply 2 g topically 4 (four) times daily. To affected joint for pain prn  ? fluticasone (FLONASE) 50 MCG/ACT nasal spray Place 1 spray into both nostrils daily.  ? levothyroxine (SYNTHROID) 112 MCG tablet TAKE 1 TABLET BY MOUTH DAILY  ? Melatonin 2.5 MG CAPS Take 1 capsule by mouth at bedtime.  ? Multiple Vitamins-Minerals (CENTRUM SILVER PO) Take 1 tablet by mouth daily.  ? nitroGLYCERIN (NITROSTAT) 0.4 MG SL tablet Place 1 tablet (0.4 mg total) under the tongue every 5 (five) minutes as needed for chest pain.  ? Omega-3 Fatty Acids (FISH OIL) 1000 MG CAPS Take 1 capsule by mouth daily.  ? PROAIR HFA 108 (90 Base) MCG/ACT inhaler Inhale 2 puffs into the lungs every 4 (four) hours as  needed for shortness of breath.  ? RESTASIS 0.05 % ophthalmic emulsion Place 2 drops into both eyes 2 (two) times daily. For dry eyes  ? rosuvastatin (CRESTOR) 10 MG tablet Take 1 tablet (10 mg total) by mouth at bedtime.  ? [DISCONTINUED] predniSONE (DELTASONE) 20 MG tablet Take 2 tablets by mouth x 2 days, 1 tablets x 2 days, 0.5 tablet x 2 days  ? [DISCONTINUED] triamcinolone cream (KENALOG) 0.1 % Apply 1 application topically 2 (two) times daily.  ? ?No facility-administered medications prior to visit.  ? ? ?Review of Systems ?Review of Systems:  ?A fourteen system review of systems was performed and found to be positive as per HPI. ? ?Last CBC ?Lab Results  ?Component Value Date  ? WBC 10.5 12/26/2021  ? HGB 12.3 12/26/2021  ? HCT 38.3 12/26/2021  ? MCV 87.4 12/26/2021  ? MCH 28.1 12/26/2021  ? RDW 15.0 12/26/2021  ? PLT 448 (H) 12/26/2021  ? ?Last metabolic panel ?Lab Results  ?Component Value Date  ? GLUCOSE 110 (H) 12/26/2021  ? NA 138 12/26/2021  ? K 4.2 12/26/2021  ? CL 102 12/26/2021  ? CO2 29 12/26/2021  ? BUN 16 12/26/2021  ? CREATININE 0.92 12/26/2021  ? GFRNONAA >60 12/26/2021  ? CALCIUM 9.2 12/26/2021  ? PHOS 3.2 12/16/2018  ? PROT 7.3 12/26/2021  ? ALBUMIN 3.8 12/26/2021  ? LABGLOB 2.4 08/11/2021  ? AGRATIO 2.0 08/11/2021  ? BILITOT 0.5 12/26/2021  ? ALKPHOS 62 12/26/2021  ?  AST 21 12/26/2021  ? ALT 19 12/26/2021  ? ANIONGAP 7 12/26/2021  ? ?Last lipids ?Lab Results  ?Component Value Date  ? CHOL 164 10/08/2021  ? HDL 53 10/08/2021  ? [place] 98 10/08/2021  ? TRIG 68 10/08/2021  ? CHOLHDL 3.1 10/08/2021  ? ?Last hemoglobin A1c ?Lab Results  ?Component Value Date  ? HGBA1C 6.2 (H) 02/17/2021  ? ?Last thyroid functions ?Lab Results  ?Component Value Date  ? TSH 4.190 08/11/2021  ? T3TOTAL 77 08/11/2021  ? ?Last vitamin D ?Lab Results  ?Component Value Date  ? VD25OH 59.93 12/26/2021  ? ?  Objective  ?  ?BP 122/68   Pulse 66   Temp 97.8 ?F (36.6 ?C)   Ht 5' 1" (1.549 m)   Wt 122 lb (55.3 kg)    SpO2 98%   BMI 23.05 kg/m?  ?BP Readings from Last 3 Encounters:  ?04/17/22 122/68  ?02/12/22 123/75  ?12/26/21 (!) 154/83  ? ?Wt Readings from Last 3 Encounters:  ?04/17/22 122 lb (55.3 kg)  ?02/12/22 122 lb 6.4 oz (55.5 kg)  ?12/26/21 124 lb 11.2 oz (56.6 kg)  ? ? ?Physical Exam  ?General:  Pleasant and cooperative, appropriate for stated age.  ?Neuro:  Alert and oriented,  extra-ocular muscles intact  ?HEENT:  Normocephalic, atraumatic, neck supple  ?Skin:  no gross rash, warm, pink. ?Cardiac:  RRR, S1 S2 ?Respiratory: CTA B/L  ?Vascular:  Ext warm, no cyanosis apprec.; cap RF less 2 sec. ?Psych:  No HI/SI, judgement and insight good, Euthymic mood. Full Affect. ? ? ?No results found for any visits on 04/17/22. ? Assessment & Plan  ?  ? ? ?Problem List Items Addressed This Visit   ? ?  ? Cardiovascular and Mediastinum  ? Essential hypertension, benign (Chronic)  ?  -BP elevated on intake. BP repeated and improved. Continue carvedilol as directed. Will continue to monitor. ? ?  ?  ? Relevant Orders  ? Comp Met (CMET)  ? CAD S/P percutaneous coronary angioplasty (Chronic)  ?  -Followed by cardiology. S/p angioplasty. Asymptomatic. ? ?  ?  ?  ? Endocrine  ? Hypothyroidism - Primary (Chronic)  ?  -Last TSH wnl at 4.190 ?-Continue current medication regimen. ?-Rechecking thyroid labs today. Pending results will make medication adjustments if indicated. ? ?  ?  ? Relevant Orders  ? TSH  ? T4, free  ?  ? Other  ? Hyperlipidemia  ?  -Last lipid panel: HDL 53, LDL 98 ?-Discussed improving medication adherence.  ?-Will continue to monitor. ? ?  ?  ? ?Other Visit Diagnoses   ? ? Insomnia, unspecified type      ? ?  ? ?Insomnia: ?-Discussed good sleep hygiene including reducing caffeine intake.  ? ?Return in about 6 months (around 10/18/2022) for [place] [facility] [place].  ?   ? ? ? ?Lorrene Reid, PA-C  ?[facility] ?[phone] (phone) ?[phone] (fax) ? ? [facility] ?

## 2022-04-17 ENCOUNTER — Encounter: Payer: Self-pay | Admitting: Physician Assistant

## 2022-04-17 ENCOUNTER — Ambulatory Visit (INDEPENDENT_AMBULATORY_CARE_PROVIDER_SITE_OTHER): Payer: Medicare PPO | Admitting: Physician Assistant

## 2022-04-17 VITALS — BP 122/68 | HR 66 | Temp 97.8°F | Ht 61.0 in | Wt 122.0 lb

## 2022-04-17 DIAGNOSIS — I10 Essential (primary) hypertension: Secondary | ICD-10-CM

## 2022-04-17 DIAGNOSIS — E039 Hypothyroidism, unspecified: Secondary | ICD-10-CM

## 2022-04-17 DIAGNOSIS — E785 Hyperlipidemia, unspecified: Secondary | ICD-10-CM | POA: Diagnosis not present

## 2022-04-17 DIAGNOSIS — I2510 Atherosclerotic heart disease of native coronary artery without angina pectoris: Secondary | ICD-10-CM

## 2022-04-17 DIAGNOSIS — G4700 Insomnia, unspecified: Secondary | ICD-10-CM

## 2022-04-17 DIAGNOSIS — Z9861 Coronary angioplasty status: Secondary | ICD-10-CM

## 2022-04-17 NOTE — Assessment & Plan Note (Signed)
-  Followed by cardiology. S/p angioplasty. Asymptomatic. ?

## 2022-04-17 NOTE — Assessment & Plan Note (Signed)
-  Last lipid panel: HDL 53, LDL 98 ?-Discussed improving medication adherence.  ?-Will continue to monitor. ?

## 2022-04-17 NOTE — Assessment & Plan Note (Signed)
-  BP elevated on intake. BP repeated and improved. Continue carvedilol as directed. Will continue to monitor. ?

## 2022-04-17 NOTE — Patient Instructions (Signed)
Insomnia Insomnia is a sleep disorder that makes it difficult to fall asleep or stay asleep. Insomnia can cause fatigue, low energy, difficulty concentrating, mood swings, and poor performance at work or school. There are three different ways to classify insomnia: Difficulty falling asleep. Difficulty staying asleep. Waking up too early in the morning. Any type of insomnia can be long-term (chronic) or short-term (acute). Both are common. Short-term insomnia usually lasts for 3 months or less. Chronic insomnia occurs at least three times a week for longer than 3 months. What are the causes? Insomnia may be caused by another condition, situation, or substance, such as: Having certain mental health conditions, such as anxiety and depression. Using caffeine, alcohol, tobacco, or drugs. Having gastrointestinal conditions, such as gastroesophageal reflux disease (GERD). Having certain medical conditions. These include: Asthma. Alzheimer's disease. Stroke. Chronic pain. An overactive thyroid gland (hyperthyroidism). Other sleep disorders, such as restless legs syndrome and sleep apnea. Menopause. Sometimes, the cause of insomnia may not be known. What increases the risk? Risk factors for insomnia include: Gender. Females are affected more often than males. Age. Insomnia is more common as people get older. Stress and certain medical and mental health conditions. Lack of exercise. Having an irregular work schedule. This may include working night shifts and traveling between different time zones. What are the signs or symptoms? If you have insomnia, the main symptom is having trouble falling asleep or having trouble staying asleep. This may lead to other symptoms, such as: Feeling tired or having low energy. Feeling nervous about going to sleep. Not feeling rested in the morning. Having trouble concentrating. Feeling irritable, anxious, or depressed. How is this diagnosed? This condition  may be diagnosed based on: Your symptoms and medical history. Your health care provider may ask about: Your sleep habits. Any medical conditions you have. Your mental health. A physical exam. How is this treated? Treatment for insomnia depends on the cause. Treatment may focus on treating an underlying condition that is causing the insomnia. Treatment may also include: Medicines to help you sleep. Counseling or therapy. Lifestyle adjustments to help you sleep better. Follow these instructions at home: Eating and drinking  Limit or avoid alcohol, caffeinated beverages, and products that contain nicotine and tobacco, especially close to bedtime. These can disrupt your sleep. Do not eat a large meal or eat spicy foods right before bedtime. This can lead to digestive discomfort that can make it hard for you to sleep. Sleep habits  Keep a sleep diary to help you and your health care provider figure out what could be causing your insomnia. Write down: When you sleep. When you wake up during the night. How well you sleep and how rested you feel the next day. Any side effects of medicines you are taking. What you eat and drink. Make your bedroom a dark, comfortable place where it is easy to fall asleep. Put up shades or blackout curtains to block light from outside. Use a white noise machine to block noise. Keep the temperature cool. Limit screen use before bedtime. This includes: Not watching TV. Not using your smartphone, tablet, or computer. Stick to a routine that includes going to bed and waking up at the same times every day and night. This can help you fall asleep faster. Consider making a quiet activity, such as reading, part of your nighttime routine. Try to avoid taking naps during the day so that you sleep better at night. Get out of bed if you are still awake after   15 minutes of trying to sleep. Keep the lights down, but try reading or doing a quiet activity. When you feel  sleepy, go back to bed. General instructions Take over-the-counter and prescription medicines only as told by your health care provider. Exercise regularly as told by your health care provider. However, avoid exercising in the hours right before bedtime. Use relaxation techniques to manage stress. Ask your health care provider to suggest some techniques that may work well for you. These may include: Breathing exercises. Routines to release muscle tension. Visualizing peaceful scenes. Make sure that you drive carefully. Do not drive if you feel very sleepy. Keep all follow-up visits. This is important. Contact a health care provider if: You are tired throughout the day. You have trouble in your daily routine due to sleepiness. You continue to have sleep problems, or your sleep problems get worse. Get help right away if: You have thoughts about hurting yourself or someone else. Get help right away if you feel like you may hurt yourself or others, or have thoughts about taking your own life. Go to your nearest emergency room or: Call 911. Call the National Suicide Prevention Lifeline at 1-800-273-8255 or 988. This is open 24 hours a day. Text the Crisis Text Line at 741741. Summary Insomnia is a sleep disorder that makes it difficult to fall asleep or stay asleep. Insomnia can be long-term (chronic) or short-term (acute). Treatment for insomnia depends on the cause. Treatment may focus on treating an underlying condition that is causing the insomnia. Keep a sleep diary to help you and your health care provider figure out what could be causing your insomnia. This information is not intended to replace advice given to you by your health care provider. Make sure you discuss any questions you have with your health care provider. Document Revised: 11/17/2021 Document Reviewed: 11/17/2021 Elsevier Patient Education  2023 Elsevier Inc.  

## 2022-04-17 NOTE — Assessment & Plan Note (Signed)
-  Last TSH wnl at 4.190 ?-Continue current medication regimen. ?-Rechecking thyroid labs today. Pending results will make medication adjustments if indicated. ? ?

## 2022-04-18 LAB — COMPREHENSIVE METABOLIC PANEL
ALT: 22 IU/L (ref 0–32)
AST: 28 IU/L (ref 0–40)
Albumin/Globulin Ratio: 1.7 (ref 1.2–2.2)
Albumin: 4.3 g/dL (ref 3.6–4.6)
Alkaline Phosphatase: 66 IU/L (ref 44–121)
BUN/Creatinine Ratio: 17 (ref 12–28)
BUN: 16 mg/dL (ref 8–27)
Bilirubin Total: 0.3 mg/dL (ref 0.0–1.2)
CO2: 26 mmol/L (ref 20–29)
Calcium: 9.6 mg/dL (ref 8.7–10.3)
Chloride: 101 mmol/L (ref 96–106)
Creatinine, Ser: 0.94 mg/dL (ref 0.57–1.00)
Globulin, Total: 2.6 g/dL (ref 1.5–4.5)
Glucose: 97 mg/dL (ref 70–99)
Potassium: 4.9 mmol/L (ref 3.5–5.2)
Sodium: 139 mmol/L (ref 134–144)
Total Protein: 6.9 g/dL (ref 6.0–8.5)
eGFR: 59 mL/min/{1.73_m2} — ABNORMAL LOW (ref >59)

## 2022-04-18 LAB — TSH: TSH: 0.114 u[IU]/mL — ABNORMAL LOW (ref 0.450–4.500)

## 2022-04-18 LAB — T4, FREE: Free T4: 1.81 ng/dL — ABNORMAL HIGH (ref 0.82–1.77)

## 2022-04-22 MED ORDER — LEVOTHYROXINE SODIUM 100 MCG PO TABS: 100.0000 ug | ORAL_TABLET | Freq: Every day | ORAL | 0 refills | Status: DC

## 2022-04-22 NOTE — Addendum Note (Signed)
Addended by: Mickel Crow on: 04/22/2022 01:14 PM ? ? Modules accepted: Orders ? ?

## 2022-05-12 DIAGNOSIS — H9113 Presbycusis, bilateral: Secondary | ICD-10-CM | POA: Insufficient documentation

## 2022-05-12 DIAGNOSIS — H6981 Other specified disorders of Eustachian tube, right ear: Secondary | ICD-10-CM | POA: Insufficient documentation

## 2022-05-12 DIAGNOSIS — H903 Sensorineural hearing loss, bilateral: Secondary | ICD-10-CM | POA: Diagnosis not present

## 2022-05-12 DIAGNOSIS — H6991 Unspecified Eustachian tube disorder, right ear: Secondary | ICD-10-CM | POA: Insufficient documentation

## 2022-05-27 ENCOUNTER — Telehealth: Payer: Self-pay | Admitting: Hematology

## 2022-05-27 ENCOUNTER — Other Ambulatory Visit: Payer: Self-pay | Admitting: Physician Assistant

## 2022-05-27 DIAGNOSIS — E039 Hypothyroidism, unspecified: Secondary | ICD-10-CM

## 2022-05-27 NOTE — Telephone Encounter (Signed)
Rescheduled upcoming appointment due to provider's schedule. Patient is aware of changes. 

## 2022-06-03 ENCOUNTER — Other Ambulatory Visit: Payer: Medicare PPO

## 2022-06-03 DIAGNOSIS — E039 Hypothyroidism, unspecified: Secondary | ICD-10-CM

## 2022-06-04 LAB — T4, FREE: Free T4: 1.15 ng/dL (ref 0.82–1.77)

## 2022-06-04 LAB — TSH: TSH: 0.284 u[IU]/mL — ABNORMAL LOW (ref 0.450–4.500)

## 2022-06-11 DIAGNOSIS — H52223 Regular astigmatism, bilateral: Secondary | ICD-10-CM | POA: Diagnosis not present

## 2022-06-11 DIAGNOSIS — H04123 Dry eye syndrome of bilateral lacrimal glands: Secondary | ICD-10-CM | POA: Diagnosis not present

## 2022-06-11 DIAGNOSIS — H5203 Hypermetropia, bilateral: Secondary | ICD-10-CM | POA: Diagnosis not present

## 2022-06-13 ENCOUNTER — Other Ambulatory Visit: Payer: Self-pay | Admitting: Physician Assistant

## 2022-06-13 DIAGNOSIS — I10 Essential (primary) hypertension: Secondary | ICD-10-CM

## 2022-06-15 ENCOUNTER — Other Ambulatory Visit: Payer: Self-pay | Admitting: Physician Assistant

## 2022-06-15 DIAGNOSIS — R4189 Other symptoms and signs involving cognitive functions and awareness: Secondary | ICD-10-CM

## 2022-07-01 ENCOUNTER — Other Ambulatory Visit: Payer: Self-pay

## 2022-07-01 DIAGNOSIS — C8190 Hodgkin lymphoma, unspecified, unspecified site: Secondary | ICD-10-CM

## 2022-07-02 ENCOUNTER — Inpatient Hospital Stay: Payer: Medicare PPO

## 2022-07-02 ENCOUNTER — Other Ambulatory Visit: Payer: Self-pay

## 2022-07-02 ENCOUNTER — Inpatient Hospital Stay: Payer: Medicare PPO | Attending: Hematology

## 2022-07-02 VITALS — BP 106/61 | HR 74 | Temp 98.0°F | Resp 18 | Wt 126.0 lb

## 2022-07-02 DIAGNOSIS — Z833 Family history of diabetes mellitus: Secondary | ICD-10-CM | POA: Insufficient documentation

## 2022-07-02 DIAGNOSIS — C8111 Nodular sclerosis classical Hodgkin lymphoma, lymph nodes of head, face, and neck: Secondary | ICD-10-CM | POA: Diagnosis not present

## 2022-07-02 DIAGNOSIS — D509 Iron deficiency anemia, unspecified: Secondary | ICD-10-CM | POA: Diagnosis not present

## 2022-07-02 DIAGNOSIS — Z8379 Family history of other diseases of the digestive system: Secondary | ICD-10-CM | POA: Insufficient documentation

## 2022-07-02 DIAGNOSIS — Z79899 Other long term (current) drug therapy: Secondary | ICD-10-CM | POA: Diagnosis not present

## 2022-07-02 DIAGNOSIS — M810 Age-related osteoporosis without current pathological fracture: Secondary | ICD-10-CM | POA: Diagnosis not present

## 2022-07-02 DIAGNOSIS — Z8249 Family history of ischemic heart disease and other diseases of the circulatory system: Secondary | ICD-10-CM | POA: Diagnosis not present

## 2022-07-02 DIAGNOSIS — C8190 Hodgkin lymphoma, unspecified, unspecified site: Secondary | ICD-10-CM

## 2022-07-02 DIAGNOSIS — N1831 Chronic kidney disease, stage 3a: Secondary | ICD-10-CM | POA: Insufficient documentation

## 2022-07-02 DIAGNOSIS — Z95828 Presence of other vascular implants and grafts: Secondary | ICD-10-CM

## 2022-07-02 DIAGNOSIS — Z818 Family history of other mental and behavioral disorders: Secondary | ICD-10-CM | POA: Diagnosis not present

## 2022-07-02 LAB — CMP (CANCER CENTER ONLY)
ALT: 19 U/L (ref 0–44)
AST: 25 U/L (ref 15–41)
Albumin: 4.2 g/dL (ref 3.5–5.0)
Alkaline Phosphatase: 51 U/L (ref 38–126)
Anion gap: 8 (ref 5–15)
BUN: 22 mg/dL (ref 8–23)
CO2: 26 mmol/L (ref 22–32)
Calcium: 9.5 mg/dL (ref 8.9–10.3)
Chloride: 107 mmol/L (ref 98–111)
Creatinine: 1.27 mg/dL — ABNORMAL HIGH (ref 0.44–1.00)
GFR, Estimated: 41 mL/min — ABNORMAL LOW (ref >60)
Glucose, Bld: 141 mg/dL — ABNORMAL HIGH (ref 70–99)
Potassium: 3.9 mmol/L (ref 3.5–5.1)
Sodium: 141 mmol/L (ref 135–145)
Total Bilirubin: 0.9 mg/dL (ref 0.3–1.2)
Total Protein: 7.3 g/dL (ref 6.5–8.1)

## 2022-07-02 LAB — CBC WITH DIFFERENTIAL (CANCER CENTER ONLY)
Abs Immature Granulocytes: 0.03 10*3/uL (ref 0.00–0.07)
Basophils Absolute: 0.1 10*3/uL (ref 0.0–0.1)
Basophils Relative: 2 %
Eosinophils Absolute: 0.9 10*3/uL — ABNORMAL HIGH (ref 0.0–0.5)
Eosinophils Relative: 13 %
HCT: 37.3 % (ref 36.0–46.0)
Hemoglobin: 12.4 g/dL (ref 12.0–15.0)
Immature Granulocytes: 0 %
Lymphocytes Relative: 19 %
Lymphs Abs: 1.3 10*3/uL (ref 0.7–4.0)
MCH: 28.8 pg (ref 26.0–34.0)
MCHC: 33.2 g/dL (ref 30.0–36.0)
MCV: 86.5 fL (ref 80.0–100.0)
Monocytes Absolute: 0.5 10*3/uL (ref 0.1–1.0)
Monocytes Relative: 8 %
Neutro Abs: 4 10*3/uL (ref 1.7–7.7)
Neutrophils Relative %: 58 %
Platelet Count: 365 10*3/uL (ref 150–400)
RBC: 4.31 MIL/uL (ref 3.87–5.11)
RDW: 15.2 % (ref 11.5–15.5)
WBC Count: 6.9 10*3/uL (ref 4.0–10.5)
nRBC: 0 % (ref 0.0–0.2)

## 2022-07-02 LAB — VITAMIN D 25 HYDROXY (VIT D DEFICIENCY, FRACTURES): Vit D, 25-Hydroxy: 39.42 ng/mL (ref 30–100)

## 2022-07-02 MED ORDER — SODIUM CHLORIDE 0.9 % IV SOLN: Freq: Once | INTRAVENOUS | Status: AC

## 2022-07-02 MED ORDER — ZOLEDRONIC ACID 4 MG/100ML IV SOLN
4.0000 mg | Freq: Once | INTRAVENOUS | Status: AC
  Administered 2022-07-02: 4 mg via INTRAVENOUS
  Filled 2022-07-02: qty 100

## 2022-07-02 NOTE — Patient Instructions (Signed)
[facility] ONCOLOGY  Discharge Instructions: Thank you for choosing [facility] to provide your oncology and hematology care.   If you have a lab appointment with the [facility], please go directly to the [facility] and check in at the registration area.   Wear comfortable clothing and clothing appropriate for easy access to any Portacath or PICC line.   We strive to give you quality time with your provider. You may need to reschedule your appointment if you arrive late (15 or more minutes).  Arriving late affects you and other patients whose appointments are after yours.  Also, if you miss three or more appointments without notifying the office, you may be dismissed from the clinic at the provider's discretion.      For prescription refill requests, have your pharmacy contact our office and allow 72 hours for refills to be completed.    Today you received the following chemotherapy and/or immunotherapy agents: Zometa      To help prevent nausea and vomiting after your treatment, we encourage you to take your nausea medication as directed.  BELOW ARE SYMPTOMS THAT SHOULD BE REPORTED IMMEDIATELY: *FEVER GREATER THAN 100.4 F (38 C) OR HIGHER *CHILLS OR SWEATING *NAUSEA AND VOMITING THAT IS NOT CONTROLLED WITH YOUR NAUSEA MEDICATION *UNUSUAL SHORTNESS OF BREATH *UNUSUAL BRUISING OR BLEEDING *URINARY PROBLEMS (pain or burning when urinating, or frequent urination) *BOWEL PROBLEMS (unusual diarrhea, constipation, pain near the anus) TENDERNESS IN MOUTH AND THROAT WITH OR WITHOUT PRESENCE OF ULCERS (sore throat, sores in mouth, or a toothache) UNUSUAL RASH, SWELLING OR PAIN  UNUSUAL VAGINAL DISCHARGE OR ITCHING   Items with * indicate a potential emergency and should be followed up as soon as possible or go to the Emergency Department if any problems should occur.  Please show the CHEMOTHERAPY ALERT CARD or IMMUNOTHERAPY ALERT CARD at check-in to the  Emergency Department and triage nurse.  Should you have questions after your visit or need to cancel or reschedule your appointment, please contact [facility]  Dept: [phone]  and follow the prompts.  Office hours are 8:00 a.m. to 4:30 p.m. Monday - Friday. Please note that voicemails left after 4:00 p.m. may not be returned until the following business day.  We are closed weekends and major holidays. You have access to a nurse at all times for urgent questions. Please call the main number to the clinic Dept: [phone] and follow the prompts.   For any non-urgent questions, you may also contact your provider using MyChart. We now offer e-Visits for anyone 80 and older to request care online for non-urgent symptoms. For details visit [email or web address].   Also download the MyChart app! Go to the app store, search "MyChart", open the app, select [facility], and log in with your MyChart username and password.  Due to Covid, a mask is required upon entering the hospital/clinic. If you do not have a mask, one will be given to you upon arrival. For doctor visits, patients may have 1 support person aged 64 or older with them. For treatment visits, patients cannot have anyone with them due to current Covid guidelines and our immunocompromised population.

## 2022-07-02 NOTE — Progress Notes (Signed)
Patient here for her zometa- no current dental issues. Plans for dental work "at the end of the year"- patient informed that her next dose might interfere and she needs to make sure she communicates with her dentist. Corrected calcium is 9.34, SCR 1.27/ GFR 41. On vitamin D supplements.

## 2022-07-04 ENCOUNTER — Other Ambulatory Visit: Payer: Self-pay | Admitting: Physician Assistant

## 2022-07-04 DIAGNOSIS — E039 Hypothyroidism, unspecified: Secondary | ICD-10-CM

## 2022-07-23 ENCOUNTER — Ambulatory Visit (INDEPENDENT_AMBULATORY_CARE_PROVIDER_SITE_OTHER): Payer: Medicare PPO | Admitting: Physician Assistant

## 2022-07-23 ENCOUNTER — Encounter: Payer: Self-pay | Admitting: Physician Assistant

## 2022-07-23 VITALS — BP 115/70 | HR 62 | Temp 97.7°F | Ht 61.0 in | Wt 125.0 lb

## 2022-07-23 DIAGNOSIS — R413 Other amnesia: Secondary | ICD-10-CM

## 2022-07-23 DIAGNOSIS — J42 Unspecified chronic bronchitis: Secondary | ICD-10-CM

## 2022-07-23 NOTE — Progress Notes (Signed)
Established patient visit   Patient: Kim Lane   DOB: 1934-10-08   88 y.o. Female  MRN: [id number] Visit Date: 07/23/2022  Chief Complaint  Patient presents with   Altered Mental Status   Subjective    HPI  Patient presenting with her daughter to discuss short-term memory concerns. Patient reports being forgetful. For example, forgets about what she went into the room for which she might remember later. Patient denies getting lost but her daughter reports pt did get lost one time last year when she was looking for her office in [place]. Patient reports forgetting friends names and forgetting how to get to familiar places. No family history of dementia or Alzheimer.       Medications: Outpatient Medications Prior to Visit  Medication Sig   aspirin 81 MG tablet Take 81 mg by mouth every other day.   carvedilol (COREG) 3.125 MG tablet TAKE 1 TABLET BY MOUTH EVERY OTHER MORNING   Cholecalciferol (HM VITAMIN D3) 100 MCG (4000 UT) CAPS 4,000 IU QD   diclofenac Sodium (VOLTAREN) 1 % GEL Apply 2 g topically 4 (four) times daily. To affected joint for pain prn   fluticasone (FLONASE) 50 MCG/ACT nasal spray Place 1 spray into both nostrils daily.   levothyroxine (SYNTHROID) 100 MCG tablet TAKE 1 TABLET BY MOUTH DAILY   Melatonin 2.5 MG CAPS Take 1 capsule by mouth at bedtime.   Multiple Vitamins-Minerals (CENTRUM SILVER PO) Take 1 tablet by mouth daily.   nitroGLYCERIN (NITROSTAT) 0.4 MG SL tablet Place 1 tablet (0.4 mg total) under the tongue every 5 (five) minutes as needed for chest pain.   Omega-3 Fatty Acids (FISH OIL) 1000 MG CAPS Take 1 capsule by mouth daily.   PROAIR HFA 108 (90 Base) MCG/ACT inhaler Inhale 2 puffs into the lungs every 4 (four) hours as needed for shortness of breath.   RESTASIS 0.05 % ophthalmic emulsion Place 2 drops into both eyes 2 (two) times daily. For dry eyes   rosuvastatin (CRESTOR) 10 MG tablet Take 1 tablet (10 mg total) by mouth at bedtime.   No  facility-administered medications prior to visit.    Review of Systems Review of Systems:  A fourteen system review of systems was performed and found to be positive as per HPI.  Last CBC Lab Results  Component Value Date   WBC 6.9 07/02/2022   HGB 12.4 07/02/2022   HCT 37.3 07/02/2022   MCV 86.5 07/02/2022   MCH 28.8 07/02/2022   RDW 15.2 07/02/2022   PLT 365 07/02/2022       Objective    BP 115/70   Pulse 62   Temp 97.7 F (36.5 C)   Ht '5\' 1"'$  (1.549 m)   Wt 125 lb (56.7 kg)   SpO2 99%   BMI 23.62 kg/m  BP Readings from Last 3 Encounters:  07/23/22 115/70  07/02/22 106/61  04/17/22 122/68   Wt Readings from Last 3 Encounters:  07/23/22 125 lb (56.7 kg)  07/02/22 126 lb (57.2 kg)  04/17/22 122 lb (55.3 kg)    Physical Exam  General:  Pleasant and cooperative, appropriate for stated age.  Neuro:  Alert and oriented,  extra-ocular muscles intact  HEENT:  Normocephalic, atraumatic, neck supple  Skin:  no gross rash, warm, pink. Cardiac:  RRR, S1 S2 Respiratory: Scattered rhonchi, no wheezing, crackles or rales. Vascular:  Ext warm, no cyanosis apprec.; cap RF less 2 sec. Psych:  No HI/SI, judgement and insight good, Euthymic mood. Full  Affect.   No results found for any visits on 07/23/22.  Assessment & Plan      Problem List Items Addressed This Visit   None Visit Diagnoses     Memory changes    -  Primary   Relevant Orders   Ambulatory referral to Neurology   Chronic bronchitis, unspecified chronic bronchitis type ([place])          Memory changes: -Due to patient's and family concern will place neurology referral for further evaluation of memory. 6CIT screening today normal and unchanged from October 2022.     07/23/2022    9:27 AM 10/17/2021   10:12 AM 10/17/2020    9:10 AM  6CIT Screen  What Year? 0 points 0 points 0 points  What month? 0 points 0 points 0 points  What time? 0 points 0 points 0 points  Count back from 20 0 points 0 points 0  points  Months in reverse 0 points 0 points 0 points  Repeat phrase 0 points 0 points 0 points  Total Score 0 points 0 points 0 points   Chronic bronchitis: -Rhonchi noted on exam, patient reports chronic cough that comes and goes. Recommend to take decongestant such as Mucinex as needed.    Return if symptoms worsen or fail to improve.        Lorrene Reid, PA-C  [facility] [phone] (phone) [phone] (fax)  [place]

## 2022-07-23 NOTE — Patient Instructions (Signed)

## 2022-07-28 ENCOUNTER — Encounter: Payer: Self-pay | Admitting: Hematology

## 2022-08-25 ENCOUNTER — Other Ambulatory Visit: Payer: Self-pay | Admitting: Physician Assistant

## 2022-08-25 DIAGNOSIS — E039 Hypothyroidism, unspecified: Secondary | ICD-10-CM

## 2022-09-03 ENCOUNTER — Ambulatory Visit: Payer: Medicare PPO | Admitting: Diagnostic Neuroimaging

## 2022-09-03 ENCOUNTER — Encounter: Payer: Self-pay | Admitting: Diagnostic Neuroimaging

## 2022-09-03 VITALS — BP 130/78 | HR 64 | Ht 61.0 in | Wt 124.1 lb

## 2022-09-03 DIAGNOSIS — R413 Other amnesia: Secondary | ICD-10-CM | POA: Diagnosis not present

## 2022-09-03 MED ORDER — MEMANTINE HCL 10 MG PO TABS: 10.0000 mg | ORAL_TABLET | Freq: Two times a day (BID) | ORAL | 12 refills | Status: AC

## 2022-09-03 NOTE — Progress Notes (Signed)
[facility]  PATIENT: Kim Lane DOB: 1934/09/19  REFERRING CLINICIAN: Lorrene Reid, PA-C  HISTORY FROM: patient, daughter, son REASON FOR VISIT: new consult    HISTORICAL  CHIEF COMPLAINT:  Chief Complaint  Patient presents with   Follow-up    She states that she cant remember anything. Her daughter notices repetitive changes and that directions can be a challenge while she is driving. Her daughter also notice some short term memory. Daughter states she would like to know more about medication for dementia. Room 6 with daughter and son    HISTORY OF PRESENT ILLNESS:   UPDATE (09/03/22, VRP): Since last visit, doing well until last 1 year. More short term memory loss, diff with driving directions, finances, distracted. Some changes with ADLs as a result. Poor sleep and more depression. Living alone. More hearing and vision loss.  UPDATE (08/29/19, VRP): Since last visit, in June 2020, has syncope attack at home (went to bathroom, on commode, then nausea, then syncope and hit head). Patient lives alone. Woke up and then able to take care of herself rest of the day.     UPDATE 12/02/16: Since last visit, stable except for weight loss and anemia. No new neuro symptoms. Some issues with fatigue, depression, getting confused with driving.    UPDATE 05/21/16: Since last visit, symptoms are improved. No new symptoms. MRI, MRA results reviewed. BP is improved. Balance is improved.    PRIOR HPI (04/20/16): 88 year old right-handed female here for evaluation of slurred speech and trouble talking. 86/17/17 patient had his new onset slurred speech and balance difficulty. 04/10/16 she had significant balance difficulty and elevated blood pressure of 180/120. Patient has been having trouble with word recall. Patient also having some trouble with swallowing as well as bilateral leg weakness. Patient feeling some additional problems in her left face and left foot. Patient had not  been taking aspirin on a regular basis prior to this event. Patient went to PCP for evaluation and then referred to me for neurology consultation.   REVIEW OF SYSTEMS: Full 14 system review of systems performed and negative with exception of: as per HPI.  ALLERGIES: Allergies  Allergen Reactions   Fosamax [Alendronate Sodium] Other (See Comments)    "made me ache all over"    HOME MEDICATIONS: Outpatient Medications Prior to Visit  Medication Sig Dispense Refill   aspirin 81 MG tablet Take 81 mg by mouth every other day.     carvedilol (COREG) 3.125 MG tablet TAKE 1 TABLET BY MOUTH EVERY OTHER MORNING 45 tablet 1   Cholecalciferol (HM VITAMIN D3) 100 MCG (4000 UT) CAPS 4,000 IU QD 30 capsule    diclofenac Sodium (VOLTAREN) 1 % GEL Apply 2 g topically 4 (four) times daily. To affected joint for pain prn 100 g 0   levothyroxine (SYNTHROID) 100 MCG tablet TAKE 1 TABLET BY MOUTH DAILY 30 tablet 0   Melatonin 2.5 MG CAPS Take 1 capsule by mouth at bedtime.     Multiple Vitamins-Minerals (CENTRUM SILVER PO) Take 1 tablet by mouth daily.     nitroGLYCERIN (NITROSTAT) 0.4 MG SL tablet Place 1 tablet (0.4 mg total) under the tongue every 5 (five) minutes as needed for chest pain. 30 tablet 0   Omega-3 Fatty Acids (FISH OIL) 1000 MG CAPS Take 1 capsule by mouth daily.     PROAIR HFA 108 (90 Base) MCG/ACT inhaler Inhale 2 puffs into the lungs every 4 (four) hours as needed for shortness of breath. [place]  g 1   RESTASIS 0.05 % ophthalmic emulsion Place 2 drops into both eyes 2 (two) times daily. For dry eyes     fluticasone (FLONASE) 50 MCG/ACT nasal spray Place 1 spray into both nostrils daily. (Patient not taking: Reported on 09/03/2022) 16 g 1   rosuvastatin (CRESTOR) 10 MG tablet Take 1 tablet (10 mg total) by mouth at bedtime. (Patient not taking: Reported on 09/03/2022) 90 tablet 3   No facility-administered medications prior to visit.    PAST MEDICAL HISTORY: Past Medical History:  Diagnosis  Date   Anemia    Anginal pain ([place])    occ   Arthritis    Blood dyscrasia 12/2016   hodgins lymphoma tx-remission   CAD (coronary artery disease) 02/2006   Taxus stent placed to LAD and Diagonal per Dr. Olevia Perches   Cataracts, bilateral    Complication of anesthesia    HTN (hypertension)    Hypercholesterolemia    Hypothyroidism    IBS (irritable bowel syndrome)    Osteoarthritis    PONV (postoperative nausea and vomiting)    Stroke ([place]) 03/2016    PAST SURGICAL HISTORY: Past Surgical History:  Procedure Laterality Date   APPENDECTOMY     IR GENERIC HISTORICAL  01/25/2017   IR US GUIDE VASC ACCESS RIGHT 01/25/2017 [name], MD [facility]   IR GENERIC HISTORICAL  01/25/2017   IR FLUORO GUIDE PORT INSERTION RIGHT 01/25/2017 [name], MD [facility]   IR REMOVAL TUN ACCESS W/ PORT W/O FL MOD SED  10/28/2017   KYPHOPLASTY N/A 06/24/2017   Procedure: KYPHOPLASTY T10;  Surgeon: Melina Schools, MD;  Location: [place] [facility];  Service: Orthopedics;  Laterality: N/A;  90 mins   LEFT HEART CATH AND CORONARY ANGIOGRAPHY N/A 06/16/2017   Procedure: Left Heart Cath and Coronary Angiography;  Surgeon: Sherren Mocha, MD;  Location: [facility];  Service: Cardiovascular;  Laterality: N/A;   MASS EXCISION Right 12/22/2016   Procedure: RIGHT NECK LYMPH NODE BIOPSY;  Surgeon: Rozetta Nunnery, MD;  Location: [facility];  Service: ENT;  Laterality: Right;   SPINE SURGERY     stents     in heart   TONSILLECTOMY     TOTAL KNEE ARTHROPLASTY Bilateral 2011,2012   VAGINAL HYSTERECTOMY      FAMILY HISTORY: Family History  Problem Relation Age of Onset   Diabetes Mother    Dementia Mother    ALS Mother    Heart Problems Father    Liver disease Sister    Cirrhosis Sister    Heart block Other        CABG   Diabetes Sister    Heart block Son        CABG   Other Brother        GOOD HEALTH   Other Daughter        GOOD HEALTH   Dementia Sister    Breast cancer Neg Hx      SOCIAL HISTORY: Social History   Socioeconomic History   Marital status: Widowed    Spouse name: Not on file   Number of children: 2   Years of education: 12   Highest education level: Not on file  Occupational History    Employer: [employer], retired  Tobacco Use   Smoking status: Former   Smokeless tobacco: Never   Tobacco comments:    quit smoking 30 years ago, very light smoker  Vaping Use  Vaping Use: Never used  Substance and Sexual Activity   Alcohol use: No    Alcohol/week: 0.0 standard drinks of alcohol   Drug use: No   Sexual activity: Not on file  Other Topics Concern   Not on file  Social History Narrative   08/29/19 Lives alone   caffeine use- coffee- 5 cups daily   Social Determinants of Health   Financial Resource Strain: Not on file  Food Insecurity: Not on file  Transportation Needs: Not on file  Physical Activity: Not on file  Stress: Not on file  Social Connections: Not on file  Intimate Partner Violence: Not on file     PHYSICAL EXAM  GENERAL EXAM/CONSTITUTIONAL: Vitals:  Vitals:   09/03/22 1521  BP: 130/78  Pulse: 64  Weight: 124 lb 2 oz (56.3 kg)  Height: '5\' 1"'$  (1.549 m)   Body mass index is 23.45 kg/m. Wt Readings from Last 3 Encounters:  09/03/22 124 lb 2 oz (56.3 kg)  07/23/22 125 lb (56.7 kg)  07/02/22 126 lb (57.2 kg)   Patient is in no distress; well developed, nourished and groomed; neck is supple  CARDIOVASCULAR: Examination of carotid arteries is normal; no carotid bruits Regular rate and rhythm, no murmurs Examination of peripheral vascular system by observation and palpation is normal  EYES: Ophthalmoscopic exam of optic discs and posterior segments is normal; no papilledema or hemorrhages No results found.  MUSCULOSKELETAL: Gait, strength, tone, movements noted in Neurologic exam below  NEUROLOGIC: MENTAL STATUS:     09/03/2022    3:28 PM  MMSE - Mini Mental State  Exam  Orientation to time 5  Orientation to Place 5  Registration 3  Attention/ Calculation 5  Recall 2  Language- name 2 objects 2  Language- repeat 1  Language- follow 3 step command 3  Language- read & follow direction 1  Write a sentence 1  Copy design 1  Total score 29   awake, alert, oriented to person, place and time recent and remote memory intact normal attention and concentration language fluent, comprehension intact, naming intact fund of knowledge appropriate  CRANIAL NERVE:  2nd - no papilledema on fundoscopic exam 2nd, 3rd, 4th, 6th - pupils equal and reactive to light, visual fields full to confrontation, extraocular muscles intact, no nystagmus 5th - facial sensation symmetric 7th - facial strength symmetric 8th - hearing intact 9th - palate elevates symmetrically, uvula midline 11th - shoulder shrug symmetric 12th - tongue protrusion midline  MOTOR:  normal bulk and tone, full strength in the BUE, BLE  SENSORY:  normal and symmetric to light touch, temperature, vibration  COORDINATION:  finger-nose-finger, fine finger movements normal  REFLEXES:  deep tendon reflexes present and symmetric  GAIT/STATION:  narrow based gait     DIAGNOSTIC DATA (LABS, IMAGING, TESTING) - I reviewed patient records, labs, notes, testing and imaging myself where available.  Lab Results  Component Value Date   WBC 6.9 07/02/2022   HGB 12.4 07/02/2022   HCT 37.3 07/02/2022   MCV 86.5 07/02/2022   PLT 365 07/02/2022      Component Value Date/Time   NA 141 07/02/2022 0939   NA 139 04/17/2022 1105   NA 139 10/07/2017 1115   K 3.9 07/02/2022 0939   K 4.3 10/07/2017 1115   CL 107 07/02/2022 0939   CO2 26 07/02/2022 0939   CO2 25 10/07/2017 1115   GLUCOSE 141 (H) 07/02/2022 0939   GLUCOSE 103 10/07/2017 1115   BUN 22 07/02/2022  0939   BUN 16 04/17/2022 1105   BUN 18.5 10/07/2017 1115   CREATININE 1.27 (H) 07/02/2022 0939   CREATININE 0.9 10/07/2017 1115    CALCIUM 9.5 07/02/2022 0939   CALCIUM 9.2 10/07/2017 1115   PROT 7.3 07/02/2022 0939   PROT 6.9 04/17/2022 1105   PROT 6.7 10/07/2017 1115   ALBUMIN 4.2 07/02/2022 0939   ALBUMIN 4.3 04/17/2022 1105   ALBUMIN 3.8 10/07/2017 1115   AST 25 07/02/2022 0939   AST 24 10/07/2017 1115   ALT 19 07/02/2022 0939   ALT 17 10/07/2017 1115   ALKPHOS 51 07/02/2022 0939   ALKPHOS 63 10/07/2017 1115   BILITOT 0.9 07/02/2022 0939   BILITOT 0.72 10/07/2017 1115   GFRNONAA 41 (L) 07/02/2022 0939   GFRAA 50 (L) 10/17/2020 0936   GFRAA 56 (L) 07/04/2020 0901   Lab Results  Component Value Date   CHOL 164 10/08/2021   HDL 53 10/08/2021   LDLCALC 98 10/08/2021   TRIG 68 10/08/2021   CHOLHDL 3.1 10/08/2021   Lab Results  Component Value Date   HGBA1C 6.2 (H) 02/17/2021   Lab Results  Component Value Date   ELFYBOFB51 025 02/17/2021   Lab Results  Component Value Date   TSH 0.284 (L) 06/03/2022    04/26/16 MRI of the brain without contrast [I reviewed images myself and agree with interpretation. -VRP]  1.    Extensive T2/FLAIR hyperintense foci in both hemispheres, much more on the right than the left. There are large confluencies of the white matter foci on the right. The foci are most consistent with chronic ischemic change, more than expected for age 33.    There are no acute findings.   04/26/16 MRI of the intracranial arteries shows the following: 1.    There is a possible focal region of stenosis within one of the M2 segments on the left, though this could be artifactual as no region of definite stenosis was noted on the source images 2.    The other arteries appear to have normal flow. 3.    No aneurysms were detected.   04/26/16 MR angiogram of the neck vessels showing normal antegrade flow with no focal region of stenosis. The origin of the left vertebral artery is not well seen but this is likely to be artifactual as the study is otherwise normal.    ASSESSMENT AND PLAN  88 y.o.  year old female here with syncope attack on commode in June 2020. Now with worsening memory loss, cognitive decline, change in ADLs since 2022.  Dx:  1. Memory loss      PLAN:  MEMORY LOSS (MCI vs mild dementia; also sleep deprivation / insomnia, depression, hearing loss, vision loss) - check MRI brain - start memantine '10mg'$  at bedtime; increase to twice a day after 1-2 weeks - safety / supervision issues reviewed - daily physical activity / exercise (at least 15-30 minutes) - eat more plants / vegetables - increase social activities, brain stimulation, games, puzzles, hobbies, crafts, arts, music - aim for at least 7-8 hours sleep per night (or more) - avoid smoking and alcohol - caregiver resources provided - caution with medications, finances, driving   Orders Placed This Encounter  Procedures   MR BRAIN WO CONTRAST   Meds ordered this encounter  Medications   memantine (NAMENDA) 10 MG tablet    Sig: Take 1 tablet (10 mg total) by mouth 2 (two) times daily.    Dispense:  60 tablet  Refill:  12   Return for pending if symptoms worsen or fail to improve, pending test results.    Penni Bombard, MD 07/18/2059, 1:56 PM Certified in Neurology, Neurophysiology and Neuroimaging  [facility] [address] [place], [place] [address] [phone]

## 2022-09-03 NOTE — Patient Instructions (Signed)
MEMORY LOSS (MCI vs mild dementia; also sleep deprivation / insomnia, depression, hearing loss, vision loss) - check MRI brain - start memantine '10mg'$  at bedtime; increase to twice a day after 1-2 weeks - safety / supervision issues reviewed - daily physical activity / exercise (at least 15-30 minutes) - eat more plants / vegetables - increase social activities, brain stimulation, games, puzzles, hobbies, crafts, arts, music - aim for at least 7-8 hours sleep per night (or more) - avoid smoking and alcohol - caregiver resources provided - caution with medications, finances, driving

## 2022-09-08 ENCOUNTER — Telehealth: Payer: Self-pay | Admitting: Diagnostic Neuroimaging

## 2022-09-08 NOTE — Telephone Encounter (Signed)
Kim Lane: [phone] exp. 09/08/22-10/08/22 sent to GI

## 2022-09-10 DIAGNOSIS — H52223 Regular astigmatism, bilateral: Secondary | ICD-10-CM | POA: Diagnosis not present

## 2022-09-10 DIAGNOSIS — H04123 Dry eye syndrome of bilateral lacrimal glands: Secondary | ICD-10-CM | POA: Diagnosis not present

## 2022-09-10 DIAGNOSIS — H5203 Hypermetropia, bilateral: Secondary | ICD-10-CM | POA: Diagnosis not present

## 2022-09-22 ENCOUNTER — Ambulatory Visit
Admission: RE | Admit: 2022-09-22 | Discharge: 2022-09-22 | Disposition: A | Discharge status: Home / Self Care | Payer: Medicare PPO | Source: Ambulatory Visit | Attending: Diagnostic Neuroimaging | Admitting: Diagnostic Neuroimaging

## 2022-09-22 DIAGNOSIS — R413 Other amnesia: Secondary | ICD-10-CM

## 2022-09-28 ENCOUNTER — Telehealth: Payer: Self-pay

## 2022-09-28 NOTE — Telephone Encounter (Signed)
-----   Message from Penni Bombard, MD sent at 09/25/2022 10:43 AM EDT ----- Atrophy and chronic small vessel ischemic disease. No other major findings. Continue current plan. -VRP

## 2022-09-28 NOTE — Telephone Encounter (Signed)
Contacted pt, informed her MRI showed Atrophy and chronic small vessel ischemic disease, advised its narrowing vessels in brain. This can happen with age, No other major findings.  Advised to call back with questions as she had none at this time and was appreciative

## 2022-09-29 NOTE — Telephone Encounter (Signed)
Called daughter Faythe Dingwall and reviewed MRI Brain results, answered her questions to her stated satisfaction.

## 2022-09-29 NOTE — Telephone Encounter (Signed)
Pt's daughter is asking for a call to discuss the results of the MRI in greater detail, please call. Please call dauhgter [phone]

## 2022-10-24 ENCOUNTER — Other Ambulatory Visit: Payer: Self-pay | Admitting: Physician Assistant

## 2022-10-24 DIAGNOSIS — E039 Hypothyroidism, unspecified: Secondary | ICD-10-CM

## 2022-11-09 ENCOUNTER — Other Ambulatory Visit: Payer: Self-pay | Admitting: Physician Assistant

## 2022-11-09 DIAGNOSIS — Z1231 Encounter for screening mammogram for malignant neoplasm of breast: Secondary | ICD-10-CM

## 2022-11-26 ENCOUNTER — Encounter (HOSPITAL_COMMUNITY): Payer: Self-pay

## 2022-11-26 ENCOUNTER — Ambulatory Visit (INDEPENDENT_AMBULATORY_CARE_PROVIDER_SITE_OTHER): Payer: Medicare PPO

## 2022-11-26 ENCOUNTER — Ambulatory Visit (HOSPITAL_COMMUNITY)
Admission: EM | Admit: 2022-11-26 | Discharge: 2022-11-26 | Disposition: A | Discharge status: Home / Self Care | Payer: Medicare PPO | Attending: Internal Medicine | Admitting: Internal Medicine

## 2022-11-26 DIAGNOSIS — M25561 Pain in right knee: Secondary | ICD-10-CM

## 2022-11-26 DIAGNOSIS — W19XXXA Unspecified fall, initial encounter: Secondary | ICD-10-CM

## 2022-11-26 DIAGNOSIS — S93401A Sprain of unspecified ligament of right ankle, initial encounter: Secondary | ICD-10-CM

## 2022-11-26 NOTE — Discharge Instructions (Addendum)
Please take Tylenol as needed for pain Your x-ray did not show any fracture Icing of your right knee and ankle As the pain improves please start gentle stretching exercises of the right knee and right ankle. If pain persist or worsens please return to urgent care to be reevaluated.

## 2022-11-26 NOTE — ED Triage Notes (Signed)
Pt reports a fall 2 days ago. Pt reports hitting the floor and injuring her right knee. Pt reports right knee swelling and pain.  Denies loss of consciousness or hitting her head.

## 2022-11-27 NOTE — ED Provider Notes (Signed)
[facility]    CSN: [id number] Arrival date & time: 11/26/22  1023      History   Chief Complaint Chief Complaint  Patient presents with   Knee Pain   Fall    HPI Kim Lane is a 88 y.o. female comes to the urgent care with right knee pain and right ankle pain 2 days duration.  Patient had a mechanical fall couple of days ago resulting in pain in the right knee right ankle.  She denied hitting her head or losing consciousness.  No dizziness, near syncope or syncopal episodes.  Patient describes the right knee and right ankle pain as sharp and throbbing.  Pain in the right knee is more severe than pain in the right ankle.  Pain in the right knee is sore moderate severity.  Patient has prosthetic right knee.  Pain is aggravated by bearing weight, walking and palpating over the right knee/right ankle.  No known relieving factors.  Pain is associated with mild swelling of the right ankle.  No radiation of pain.  No bruising/swelling of the right knee.  No numbness or tingling in the right foot.  HPI  Past Medical History:  Diagnosis Date   Anemia    Anginal pain ([place])    occ   Arthritis    Blood dyscrasia 12/2016   hodgins lymphoma tx-remission   CAD (coronary artery disease) 02/2006   Taxus stent placed to LAD and Diagonal per Dr. Olevia Perches   Cataracts, bilateral    Complication of anesthesia    HTN (hypertension)    Hypercholesterolemia    Hypothyroidism    IBS (irritable bowel syndrome)    Osteoarthritis    PONV (postoperative nausea and vomiting)    Stroke ([place]) 03/2016    Patient Active Problem List   Diagnosis Date Noted   Dysfunction of right eustachian tube 05/12/2022   Presbycusis of both ears 05/12/2022   Cough 02/15/2022   Headache above the eye region 10/29/2021   Acute non-recurrent pansinusitis 10/29/2021   Vasomotor rhinitis 10/29/2021   Stage 3a chronic kidney disease ([place]) 12/12/2019   Degenerative disc disease, lumbar- MRI 6/ 2018  08/03/2019   Neuropathy involving both lower extremities- due to DDD of L-Spine 08/03/2019   Elevated serum creatinine 08/03/2019   Prediabetes 06/20/2019   Hyperlipidemia 06/20/2019   History of CVA (cerebrovascular accident)- 2017-   05/31/2019   Syncope and collapse 05/31/2019   Blistering rash-new onset right palmar surface hand- at base esp btwn fingers 05/31/2019   Maceration of skin 05/31/2019   Degeneration of lumbar intervertebral disc 04/11/2019   Lumbar radiculopathy 04/11/2019   Pain of both hip joints 03/17/2019   Localized, primary osteoarthritis 12/07/2018   Cramps, muscle, general-mostly bilateral lower extremities, worse at night but not just nocturnal 12/07/2018   Pain in finger of right hand 11/30/2018   Sleep difficulties 09/11/2018   Muscle cramps at night 08/10/2018   Arthritis 08/10/2018   Dehydration, mild 08/10/2018   S/P kyphoplasty- dr brooks- ortho 04/05/2018   Postmenopausal 04/05/2018   Closed fracture of multiple ribs 04/05/2018   Allergic contact dermatitis due to cosmetics 01/26/2018   Trigger finger, acquired- R 3rd digit 01/26/2018   Osteoarthritis of finger 01/26/2018   Vitamin D deficiency 11/24/2017   Glucose intolerance (impaired glucose tolerance) 11/24/2017   Osteoarthritis 09/16/2017   History of iron deficiency anemia-  by ONC 07/23/2017   Osteoporosis 07/23/2017   CAD S/P percutaneous coronary angioplasty 07/02/2017   Closed compression fracture  of L2 lumbar vertebra 06/24/2017   Abnormal nuclear cardiac imaging test 06/16/2017   Malnutrition of moderate degree 04/27/2017   Symptomatic anemia 04/26/2017   Hodgkin lymphoma ([place]) 04/26/2017   Port catheter in place 02/02/2017   Nodular sclerosis Hodgkin lymphoma of lymph nodes of neck ([place]) 01/13/2017   Dyspnea 09/14/2012   Epistaxis 06/02/2011   HYPERCHOLESTEROLEMIA, MIXED 01/08/2009   Essential hypertension, benign 01/08/2009   Coronary atherosclerosis- sees Dr. Johnsie Cancel 01/08/2009    Hypothyroidism 01/08/2009    Past Surgical History:  Procedure Laterality Date   APPENDECTOMY     IR GENERIC HISTORICAL  01/25/2017   IR US GUIDE VASC ACCESS RIGHT 01/25/2017 [name], MD [facility]   IR GENERIC HISTORICAL  01/25/2017   IR FLUORO GUIDE PORT INSERTION RIGHT 01/25/2017 [name], MD [facility]   IR REMOVAL TUN ACCESS W/ PORT W/O FL MOD SED  10/28/2017   KYPHOPLASTY N/A 06/24/2017   Procedure: KYPHOPLASTY T10;  Surgeon: Melina Schools, MD;  Location: [facility];  Service: Orthopedics;  Laterality: N/A;  90 mins   LEFT HEART CATH AND CORONARY ANGIOGRAPHY N/A 06/16/2017   Procedure: Left Heart Cath and Coronary Angiography;  Surgeon: Sherren Mocha, MD;  Location: [facility];  Service: Cardiovascular;  Laterality: N/A;   MASS EXCISION Right 12/22/2016   Procedure: RIGHT NECK LYMPH NODE BIOPSY;  Surgeon: Rozetta Nunnery, MD;  Location: [facility];  Service: ENT;  Laterality: Right;   SPINE SURGERY     stents     in heart   TONSILLECTOMY     TOTAL KNEE ARTHROPLASTY Bilateral 2011,2012   VAGINAL HYSTERECTOMY      OB History   No obstetric history on file.      Home Medications    Prior to Admission medications   Medication Sig Start Date End Date Taking? Authorizing Provider  aspirin 81 MG tablet Take 81 mg by mouth every other day.    [provider]  carvedilol (COREG) 3.125 MG tablet TAKE 1 TABLET BY MOUTH EVERY OTHER MORNING 06/15/22   Lorrene Reid, PA-C  Cholecalciferol (HM VITAMIN D3) 100 MCG (4000 UT) CAPS 4,000 IU QD 12/12/19   Opalski, Deborah, DO  diclofenac Sodium (VOLTAREN) 1 % GEL Apply 2 g topically 4 (four) times daily. To affected joint for pain prn 12/12/19   [name], DO  levothyroxine (SYNTHROID) 100 MCG tablet TAKE 1 TABLET BY MOUTH DAILY 10/26/22   Lorrene Reid, PA-C  Melatonin 2.5 MG CAPS Take 1 capsule by mouth at bedtime.    [provider]  memantine (NAMENDA) 10 MG tablet Take 1  tablet (10 mg total) by mouth 2 (two) times daily. 09/03/22   Penumalli, Earlean Polka, MD  Multiple Vitamins-Minerals (CENTRUM SILVER PO) Take 1 tablet by mouth daily.    [provider]  nitroGLYCERIN (NITROSTAT) 0.4 MG SL tablet Place 1 tablet (0.4 mg total) under the tongue every 5 (five) minutes as needed for chest pain. 08/11/21   Lorrene Reid, PA-C  Omega-3 Fatty Acids (FISH OIL) 1000 MG CAPS Take 1 capsule by mouth daily.    [provider]  PROAIR HFA 108 [phone] Base) MCG/ACT inhaler Inhale 2 puffs into the lungs every 4 (four) hours as needed for shortness of breath. 08/11/21   Abonza, Maritza, PA-C  RESTASIS 0.05 % ophthalmic emulsion Place 2 drops into both eyes 2 (two) times daily. For dry eyes 09/19/15   [provider]  rosuvastatin (CRESTOR) 10 MG tablet Take 1  tablet (10 mg total) by mouth at bedtime. Patient not taking: Reported on 09/03/2022 07/02/21   Josue Hector, MD  levothyroxine (SYNTHROID) 112 MCG tablet TAKE 1 TABLET BY MOUTH DAILY Patient not taking: Reported on 04/22/2022 12/29/21   Lorrene Reid, PA-C    Family History Family History  Problem Relation Age of Onset   Diabetes Mother    Dementia Mother    ALS Mother    Heart Problems Father    Liver disease Sister    Cirrhosis Sister    Heart block Other        CABG   Diabetes Sister    Heart block Son        CABG   Other Brother        GOOD HEALTH   Other Daughter        GOOD HEALTH   Dementia Sister    Breast cancer Neg Hx     Social History Social History   Tobacco Use   Smoking status: Former   Smokeless tobacco: Never   Tobacco comments:    quit smoking 30 years ago, very light smoker  Vaping Use   Vaping Use: Never used  Substance Use Topics   Alcohol use: No    Alcohol/week: 0.0 standard drinks of alcohol   Drug use: No     Allergies   Fosamax [alendronate sodium]   Review of Systems Review of Systems As per HPI  Physical Exam Triage Vital Signs ED Triage  Vitals  Enc Vitals Group     BP 11/26/22 1337 (!) 157/89     Pulse Rate 11/26/22 1337 84     Resp --      Temp 11/26/22 1337 98.1 F (36.7 C)     Temp Source 11/26/22 1337 Oral     SpO2 11/26/22 1337 98 %     Weight --      Height --      Head Circumference --      Peak Flow --      Pain Score 11/26/22 1341 4     Pain Loc --      Pain Edu? --      Excl. in [place]? --    No data found.  Updated Vital Signs BP (!) 157/89 (BP Location: Left Arm)   Pulse 84   Temp 98.1 F (36.7 C) (Oral)   SpO2 98%   Visual Acuity Right Eye Distance:   Left Eye Distance:   Bilateral Distance:    Right Eye Near:   Left Eye Near:    Bilateral Near:     Physical Exam Vitals and nursing note reviewed.  Constitutional:      General: She is not in acute distress.    Appearance: Normal appearance. She is not ill-appearing.  Cardiovascular:     Rate and Rhythm: Normal rate and regular rhythm.  Pulmonary:     Effort: Pulmonary effort is normal.     Breath sounds: Normal breath sounds.  Abdominal:     General: Bowel sounds are normal.     Palpations: Abdomen is soft.  Musculoskeletal:     Comments: Full range of motion of the right knee.  Point tenderness on the medial aspect of the right knee.  No bruising noted. Right ankle: Full range of motion of the right ankle.  Mild swelling of the right ankle.  No tenderness on palpation of the medial and lateral malleoli.  Neurological:     General: No focal deficit  present.     Mental Status: She is alert.      UC Treatments / Results  Labs (all labs ordered are listed, but only abnormal results are displayed) Labs Reviewed - No data to display  EKG   Radiology DG Knee Complete 4 Views Right  Result Date: 11/26/2022 CLINICAL DATA:  Fall EXAM: RIGHT KNEE - COMPLETE 4 VIEW COMPARISON:  None Available. FINDINGS: Prior total right knee arthroplasty. Hardware is intact. No evidence of fracture or perihardware lucency. Vascular calcifications.  Soft tissues are unremarkable. IMPRESSION: Prior total right knee arthroplasty.  No acute osseous abnormality. Electronically Signed   By: Yetta Glassman M.D.   On: 11/26/2022 15:03    Procedures Procedures (including critical care time)  Medications Ordered in UC Medications - No data to display  Initial Impression / Assessment and Plan / UC Course  I have reviewed the triage vital signs and the nursing notes.  Pertinent labs & imaging results that were available during my care of the patient were reviewed by me and considered in my medical decision making (see chart for details).     1.  Right knee pain: X-ray of the right knee is negative for acute fracture Icing of the right knee Gentle range of motion exercises Tylenol as needed for pain  2.  Right ankle sprain: No indication for imaging of the right ankle Management as above Elevate right leg Pain management as above Return precautions given. Final Clinical Impressions(s) / UC Diagnoses   Final diagnoses:  Right anterior knee pain  Sprain of right ankle, unspecified ligament, initial encounter  Fall, initial encounter     Discharge Instructions      Please take Tylenol as needed for pain Your x-ray did not show any fracture Icing of your right knee and ankle As the pain improves please start gentle stretching exercises of the right knee and right ankle. If pain persist or worsens please return to urgent care to be reevaluated.    ED Prescriptions   None    PDMP not reviewed this encounter.   Chase Picket, MD 11/27/22 [id number]

## 2022-11-27 NOTE — Progress Notes (Signed)
Patient ID: Kim Lane, female   DOB: 1934/01/12, 88 y.o.   MRN: [id number]   88 y.o. f/u for CAD had high risk myovue 06/10/17 for preoperative back surgery EF 58% Ischemia in the apical and anterior regions F/u cath done by Dr Burt Knack reviewed   Conclusion   1. Severe three-vessel coronary artery disease with: Severe mid LAD and first diagonal stenosis with continued patency of the stented segment in the mid LAD Severe ostial left circumflex and first OM stenoses Severe proximal RCA and posterior AV segment stenoses   2. Normal LV function by nuclear assessment     She was allowed to have her back surgery with Dr Rolena Infante and had no cardiac complications and good pain relief  Follows with Oncology for Hodgkin's Lymphoma stable at this point   Daughter has throat cancer only age 29 has had surgery/chemo/XRT feeding tube out and gaining weight  This has been hard on her   TSH has been suppressed and synthroid dose adjusted by primary   Mechanical fall 11/26/22 with sprain right ankle and bruise knee No fracture by x ray  More dementia / memory loss Seen by neurology MRI with single chronic left cerebellar micro hemorrhage and small vessel dx Started on Namenda  No angina Had mechanical fall this week with persistent knee pain   ROS: Denies fever, malais, weight loss, blurry vision, decreased visual acuity, cough, sputum, SOB, hemoptysis, pleuritic pain, palpitaitons, heartburn, abdominal pain, melena, lower extremity edema, claudication, or rash.  All other systems reviewed and negative  General: There were no vitals taken for this visit. Affect appropriate Elderly white female  HEENT: right neck lymph nodes  Neck supple with no adenopathy JVP normal no bruits no thyromegaly Lungs clear with no wheezing and good diaphragmatic motion Heart:  S1/S2 SEM  murmur, no rub, gallop or click PMI normal Abdomen: benighn, BS positve, no tenderness, no AAA post appy  no bruit.  No HSM or  HJR Distal pulses intact with no bruits LE telangiectasia  Neuro non-focal Skin warm and dry Post bilateral TKR Varicosities bilateral   Current Outpatient Medications  Medication Sig Dispense Refill   aspirin 81 MG tablet Take 81 mg by mouth every other day.     carvedilol (COREG) 3.125 MG tablet TAKE 1 TABLET BY MOUTH EVERY OTHER MORNING 45 tablet 1   Cholecalciferol (HM VITAMIN D3) 100 MCG (4000 UT) CAPS 4,000 IU QD 30 capsule    diclofenac Sodium (VOLTAREN) 1 % GEL Apply 2 g topically 4 (four) times daily. To affected joint for pain prn 100 g 0   levothyroxine (SYNTHROID) 100 MCG tablet TAKE 1 TABLET BY MOUTH DAILY 30 tablet 0   Melatonin 2.5 MG CAPS Take 1 capsule by mouth at bedtime.     memantine (NAMENDA) 10 MG tablet Take 1 tablet (10 mg total) by mouth 2 (two) times daily. 60 tablet 12   Multiple Vitamins-Minerals (CENTRUM SILVER PO) Take 1 tablet by mouth daily.     nitroGLYCERIN (NITROSTAT) 0.4 MG SL tablet Place 1 tablet (0.4 mg total) under the tongue every 5 (five) minutes as needed for chest pain. 30 tablet 0   Omega-3 Fatty Acids (FISH OIL) 1000 MG CAPS Take 1 capsule by mouth daily.     PROAIR HFA 108 (90 Base) MCG/ACT inhaler Inhale 2 puffs into the lungs every 4 (four) hours as needed for shortness of breath. 18 g 1   RESTASIS 0.05 % ophthalmic emulsion Place 2 drops into both  eyes 2 (two) times daily. For dry eyes     rosuvastatin (CRESTOR) 10 MG tablet Take 1 tablet (10 mg total) by mouth at bedtime. (Patient not taking: Reported on 09/03/2022) 90 tablet 3   No current facility-administered medications for this visit.    Allergies  Fosamax [alendronate sodium]  Electrocardiogram:  12/08/2022 NSR rate 67 normal   Assessment and Plan  CAD: Severe 3VD no angina Continue medical Rx and consider intervention only for unstable agnina give age and co morbidities  Continue beta blocker and ASA   Thyroid  Continue replacement TSH suppressed 04/17/22 and again at 0.284  in June f/u primary to adjust dose of synthroid  Dementia:  with cognitive decline f/u neuro Now on Namenda  Lymphoma:  Post 5 cycles AVD finished 05/19/17 on Prolia f/u with Dr [name]   Back:  Post kyphoplasty with good pain relief. Compression fractures ? Due to osteopenia did not tolerate fosamax in past f/u primary   Claudication:  Leg pain not vascular claudication normal ABI's 03/21/19 Restless leg syndrome f/u primary   HLD:  on crestor prior compliance issues update labs  Ortho:  right knee pain no effusion Mobic 15 mg PRN called in f/u ortho/primary     F/U in a year   [employer]

## 2022-12-08 ENCOUNTER — Ambulatory Visit: Payer: Medicare PPO | Attending: Cardiovascular Disease | Admitting: Cardiovascular Disease

## 2022-12-08 ENCOUNTER — Encounter: Payer: Self-pay | Admitting: Cardiovascular Disease

## 2022-12-08 VITALS — BP 132/72 | HR 67 | Ht 61.0 in | Wt 127.5 lb

## 2022-12-08 DIAGNOSIS — I2510 Atherosclerotic heart disease of native coronary artery without angina pectoris: Secondary | ICD-10-CM | POA: Diagnosis not present

## 2022-12-08 MED ORDER — MELOXICAM 15 MG PO TABS: 15.0000 mg | ORAL_TABLET | Freq: Every day | ORAL | 1 refills | Status: AC | PRN

## 2022-12-08 NOTE — Patient Instructions (Addendum)
Medication Instructions:  Your physician has recommended you make the following change in your medication:  1-TAKE Mobic 15 mg by mouth as needed for pain.  *If you need a refill on your cardiac medications before your next appointment, please call your pharmacy*  Lab Work: If you have labs (blood work) drawn today and your tests are completely normal, you will receive your results only by: [facility] (if you have MyChart) OR A paper copy in the mail If you have any lab test that is abnormal or we need to change your treatment, we will call you to review the results.  Testing/Procedures: None ordered today.  Follow-Up: At [facility], you and your health needs are our priority.  As part of our continuing mission to provide you with exceptional heart care, we have created designated Provider Care Teams.  These Care Teams include your primary Cardiologist (physician) and Advanced Practice Providers (APPs -  Physician Assistants and Nurse Practitioners) who all work together to provide you with the care you need, when you need it.  We recommend signing up for the patient portal called "MyChart".  Sign up information is provided on this After Visit Summary.  MyChart is used to connect with patients for Virtual Visits (Telemedicine).  Patients are able to view lab/test results, encounter notes, upcoming appointments, etc.  Non-urgent messages can be sent to your provider as well.   To learn more about what you can do with MyChart, go to [email or web address].    Your next appointment:   12 month(s)  The format for your next appointment:   In Person  Provider:   [name], MD     Important Information About Sugar

## 2022-12-22 ENCOUNTER — Other Ambulatory Visit: Payer: Self-pay

## 2022-12-22 ENCOUNTER — Telehealth: Payer: Self-pay | Admitting: *Deleted

## 2022-12-22 DIAGNOSIS — I10 Essential (primary) hypertension: Secondary | ICD-10-CM

## 2022-12-22 DIAGNOSIS — E039 Hypothyroidism, unspecified: Secondary | ICD-10-CM

## 2022-12-22 MED ORDER — CARVEDILOL 3.125 MG PO TABS: ORAL_TABLET | ORAL | 0 refills | Status: DC

## 2022-12-22 MED ORDER — LEVOTHYROXINE SODIUM 100 MCG PO TABS: 100.0000 ug | ORAL_TABLET | Freq: Every day | ORAL | 0 refills | Status: DC

## 2022-12-22 NOTE — Telephone Encounter (Signed)
Informed pt of below.Kim Lane, CMA  

## 2022-12-22 NOTE — Telephone Encounter (Signed)
Pt called and said she requested refills before christmas and pharmacy told her they reached out to [place] and had not heard from [place]. Please refill the below medications.     carvedilol (COREG) 3.125 MG tablet   levothyroxine (SYNTHROID) 100 MCG tablet     [facility] - [place], [place], [place] - [address]  LOV:07/23/22 ROV:02/01/2023

## 2022-12-22 NOTE — Telephone Encounter (Signed)
Both Rx has been sent to [facility]

## 2022-12-31 ENCOUNTER — Ambulatory Visit: Payer: Medicare PPO

## 2022-12-31 ENCOUNTER — Other Ambulatory Visit: Payer: Self-pay

## 2022-12-31 ENCOUNTER — Ambulatory Visit: Payer: Medicare PPO | Admitting: Hematology

## 2022-12-31 ENCOUNTER — Other Ambulatory Visit: Payer: Medicare PPO

## 2022-12-31 DIAGNOSIS — C8190 Hodgkin lymphoma, unspecified, unspecified site: Secondary | ICD-10-CM

## 2022-12-31 DIAGNOSIS — M810 Age-related osteoporosis without current pathological fracture: Secondary | ICD-10-CM

## 2023-01-01 ENCOUNTER — Other Ambulatory Visit: Payer: Self-pay

## 2023-01-01 ENCOUNTER — Inpatient Hospital Stay: Payer: Medicare PPO | Attending: Hematology

## 2023-01-01 ENCOUNTER — Inpatient Hospital Stay (HOSPITAL_BASED_OUTPATIENT_CLINIC_OR_DEPARTMENT_OTHER): Payer: Medicare PPO | Admitting: Hematology

## 2023-01-01 ENCOUNTER — Inpatient Hospital Stay: Payer: Medicare PPO

## 2023-01-01 VITALS — BP 148/68 | HR 63 | Temp 97.2°F | Resp 18 | Wt 125.7 lb

## 2023-01-01 DIAGNOSIS — N1831 Chronic kidney disease, stage 3a: Secondary | ICD-10-CM | POA: Insufficient documentation

## 2023-01-01 DIAGNOSIS — Z79899 Other long term (current) drug therapy: Secondary | ICD-10-CM | POA: Insufficient documentation

## 2023-01-01 DIAGNOSIS — M810 Age-related osteoporosis without current pathological fracture: Secondary | ICD-10-CM

## 2023-01-01 DIAGNOSIS — Z818 Family history of other mental and behavioral disorders: Secondary | ICD-10-CM | POA: Diagnosis not present

## 2023-01-01 DIAGNOSIS — Z8249 Family history of ischemic heart disease and other diseases of the circulatory system: Secondary | ICD-10-CM | POA: Insufficient documentation

## 2023-01-01 DIAGNOSIS — F0390 Unspecified dementia without behavioral disturbance: Secondary | ICD-10-CM | POA: Insufficient documentation

## 2023-01-01 DIAGNOSIS — C8190 Hodgkin lymphoma, unspecified, unspecified site: Secondary | ICD-10-CM

## 2023-01-01 DIAGNOSIS — C8111 Nodular sclerosis classical Hodgkin lymphoma, lymph nodes of head, face, and neck: Secondary | ICD-10-CM | POA: Diagnosis not present

## 2023-01-01 DIAGNOSIS — D509 Iron deficiency anemia, unspecified: Secondary | ICD-10-CM | POA: Diagnosis not present

## 2023-01-01 DIAGNOSIS — Z833 Family history of diabetes mellitus: Secondary | ICD-10-CM | POA: Insufficient documentation

## 2023-01-01 DIAGNOSIS — Z8379 Family history of other diseases of the digestive system: Secondary | ICD-10-CM | POA: Insufficient documentation

## 2023-01-01 DIAGNOSIS — Z95828 Presence of other vascular implants and grafts: Secondary | ICD-10-CM

## 2023-01-01 LAB — CMP (CANCER CENTER ONLY)
ALT: 19 U/L (ref 0–44)
AST: 24 U/L (ref 15–41)
Albumin: 4 g/dL (ref 3.5–5.0)
Alkaline Phosphatase: 66 U/L (ref 38–126)
Anion gap: 7 (ref 5–15)
BUN: 17 mg/dL (ref 8–23)
CO2: 28 mmol/L (ref 22–32)
Calcium: 9.5 mg/dL (ref 8.9–10.3)
Chloride: 104 mmol/L (ref 98–111)
Creatinine: 1.02 mg/dL — ABNORMAL HIGH (ref 0.44–1.00)
GFR, Estimated: 54 mL/min — ABNORMAL LOW (ref >60)
Glucose, Bld: 100 mg/dL — ABNORMAL HIGH (ref 70–99)
Potassium: 4.1 mmol/L (ref 3.5–5.1)
Sodium: 139 mmol/L (ref 135–145)
Total Bilirubin: 0.4 mg/dL (ref 0.3–1.2)
Total Protein: 7.2 g/dL (ref 6.5–8.1)

## 2023-01-01 LAB — CBC WITH DIFFERENTIAL (CANCER CENTER ONLY)
Abs Immature Granulocytes: 0.04 10*3/uL (ref 0.00–0.07)
Basophils Absolute: 0.1 10*3/uL (ref 0.0–0.1)
Basophils Relative: 2 %
Eosinophils Absolute: 1.5 10*3/uL — ABNORMAL HIGH (ref 0.0–0.5)
Eosinophils Relative: 18 %
HCT: 38.2 % (ref 36.0–46.0)
Hemoglobin: 12.7 g/dL (ref 12.0–15.0)
Immature Granulocytes: 1 %
Lymphocytes Relative: 18 %
Lymphs Abs: 1.4 10*3/uL (ref 0.7–4.0)
MCH: 29.4 pg (ref 26.0–34.0)
MCHC: 33.2 g/dL (ref 30.0–36.0)
MCV: 88.4 fL (ref 80.0–100.0)
Monocytes Absolute: 0.6 10*3/uL (ref 0.1–1.0)
Monocytes Relative: 8 %
Neutro Abs: 4.2 10*3/uL (ref 1.7–7.7)
Neutrophils Relative %: 53 %
Platelet Count: 385 10*3/uL (ref 150–400)
RBC: 4.32 MIL/uL (ref 3.87–5.11)
RDW: 14.3 % (ref 11.5–15.5)
WBC Count: 7.9 10*3/uL (ref 4.0–10.5)
nRBC: 0 % (ref 0.0–0.2)

## 2023-01-01 LAB — VITAMIN D 25 HYDROXY (VIT D DEFICIENCY, FRACTURES): Vit D, 25-Hydroxy: 38.61 ng/mL (ref 30–100)

## 2023-01-01 MED ORDER — ZOLEDRONIC ACID 4 MG/100ML IV SOLN
4.0000 mg | Freq: Once | INTRAVENOUS | Status: AC
  Administered 2023-01-01: 4 mg via INTRAVENOUS
  Filled 2023-01-01: qty 100

## 2023-01-01 MED ORDER — SODIUM CHLORIDE 0.9 % IV SOLN: Freq: Once | INTRAVENOUS | Status: AC

## 2023-01-01 NOTE — Patient Instructions (Signed)

## 2023-01-01 NOTE — Progress Notes (Signed)
Patient seen by MD today  Vitals are within treatment parameters.  Labs reviewed: and are within treatment parameters."  Per physician team, patient is ready for treatment and there are NO modifications to the treatment plan.

## 2023-01-01 NOTE — Progress Notes (Signed)
HEMATOLOGY/ONCOLOGY CLINIC NOTE  Date of Service: 01/01/23   Patient Care Team: Lorrene Reid, PA-C as PCP - General Josue Hector, MD as PCP - Cardiology (Cardiology) Josue Hector, MD as Consulting Physician (Cardiology) Penni Bombard, MD as Consulting Physician (Neurology) Jarome Matin, MD as Consulting Physician (Dermatology) Rozetta Nunnery, MD (Inactive) as Consulting Physician (Otolaryngology) Brunetta Genera, MD as Consulting Physician (Hematology) Melina Schools, MD as Consulting Physician (Orthopedic Surgery) Renette Butters, MD as Attending Physician (Orthopedic Surgery)  CHIEF COMPLAINTS/PURPOSE OF CONSULTATION:   Follow-up for continued surveillance of Hodgkin's lymphoma Management of osteoporosis with Zometa  HISTORY OF PRESENTING ILLNESS:   plz see previous   INTERVAL HISTORY:  Kim Lane is an 89 y/o female here for her 12-monthsurveillance visit for Hodgkin's lymphoma and negative dose of Zometa for osteoporosis.  Patient was last seen by me on 12/26/2021 and was doing well overall with no medical complaints.  She presented to the ED on 11/26/2022 with sharp/throbbing right knee and right ankle pain due to a mechanical fall. X-ray did not reveal any fracture.  Today, she reports a recent fall a month ago. This fall occurred once trying to bend down. She denies LOC or bumping her head. She regularly uses her cane, but not in the home. She denies infection issues over the last year. No new back pain. No new lumps/bumps or new skin rashes. No breathing changes or abdominal changes. No sudden weight loss. She reports she has gained some weight recently. She currently weights 125 lbs.   Her hips occasionally hurt at night. She is tolerating her Zometa infusions every 6 months.  She continues to live by herself. She is able to take care of all her daily activities on her own. No particular painful tooth or acute dental issues at this  time.  No recent dental infections. She is unsure if dentist is aware of Zometa infusions. She does not anticipates any dental procedures within next month.  No new medications besides what her neurologist had recommended for her dementia. She reports that some issues with her brain blood vessels.  No abdominal pain or loss of appetite.   Her son was recently diagnosed with stage 4 esophageal/liver cancer. She reports her daughter also has cancer.  MEDICAL HISTORY:  Past Medical History:  Diagnosis Date   Anemia    [place]    occ   Arthritis    Blood dyscrasia 12/2016   hodgins lymphoma tx-remission   CAD (coronary artery disease) 02/2006   Taxus stent placed to LAD and Diagonal per Dr. BOlevia Perches  Cataracts, bilateral    Complication of anesthesia    HTN (hypertension)    Hypercholesterolemia    Hypothyroidism    IBS (irritable bowel syndrome)    Osteoarthritis    PONV (postoperative nausea and vomiting)    [place]  Osteoporosis Cardiologist Dr. PJenkins [facility] -Dr. MEarlean Shawl SURGICAL HISTORY: Past Surgical History:  Procedure Laterality Date   APPENDECTOMY     IR GENERIC HISTORICAL  01/25/2017   IR [place] VASC ACCESS RIGHT 01/25/2017 GAletta Edouard MD [facility]   IR GENERIC HISTORICAL  01/25/2017   IR FLUORO GUIDE PORT INSERTION RIGHT 01/25/2017 GAletta Edouard MD [facility]   IR REMOVAL TUN ACCESS W/ PORT W/O FL MOD SED  10/28/2017   KYPHOPLASTY N/A 06/24/2017   Procedure: KYPHOPLASTY T10;  Surgeon: BMelina Schools MD;  [place]  Service: Orthopedics;  Laterality: N/A;  90 mins   LEFT HEART CATH AND CORONARY ANGIOGRAPHY N/A 06/16/2017   Procedure: Left Heart Cath and Coronary Angiography;  Surgeon: Sherren Mocha, MD;  Location: [facility] [place] [facility];  Service: Cardiovascular;  Laterality: N/A;   MASS EXCISION Right 12/22/2016   Procedure: RIGHT NECK LYMPH NODE BIOPSY;  Surgeon: Rozetta Nunnery, MD;  Location: [facility];   Service: ENT;  Laterality: Right;   SPINE SURGERY     stents     in heart   TONSILLECTOMY     TOTAL KNEE ARTHROPLASTY Bilateral 2011,2012   VAGINAL HYSTERECTOMY      SOCIAL HISTORY: Social History   Socioeconomic History   Marital status: Widowed    Spouse name: Not on file   Number of children: 2   Years of education: 12   Highest education level: Not on file  Occupational History    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, retired  Tobacco Use   Smoking status: Former   Smokeless tobacco: Never   Tobacco comments:    quit smoking 30 years ago, very light smoker  Vaping Use   Vaping Use: Never used  Substance and Sexual Activity   Alcohol use: No    Alcohol/week: 0.0 standard drinks of alcohol   Drug use: No   Sexual activity: Not on file  Other Topics Concern   Not on file  Social History Narrative   08/29/19 Lives alone   caffeine use- coffee- 5 cups daily   Social Determinants of Health   Financial Resource Strain: Not on file  Food Insecurity: Not on file  Transportation Needs: Not on file  Physical Activity: Not on file  Stress: Not on file  Social Connections: Not on file  Intimate Partner Violence: Not on file    FAMILY HISTORY: Family History  Problem Relation Age of Onset   Diabetes Mother    Dementia Mother    ALS Mother    Heart Problems Father    Liver disease Sister    Cirrhosis Sister    Heart block Other        CABG   Diabetes Sister    Heart block Son        CABG   Other Brother        GOOD HEALTH   Other Daughter        GOOD HEALTH   Dementia Sister    Breast cancer Neg Hx     ALLERGIES:  is allergic to fosamax [alendronate sodium].  MEDICATIONS:  Current Outpatient Medications  Medication Sig Dispense Refill   aspirin 81 MG tablet Take 81 mg by mouth every other day.     carvedilol (COREG) 3.125 MG tablet TAKE 1 TABLET BY MOUTH EVERY OTHER MORNING 45 tablet 0   Cholecalciferol (HM VITAMIN D3) 100 MCG (4000  UT) CAPS 4,000 IU QD 30 capsule    diclofenac Sodium (VOLTAREN) 1 % GEL Apply 2 g topically 4 (four) times daily. To affected joint for pain prn 100 g 0   levothyroxine (SYNTHROID) 100 MCG tablet Take 1 tablet (100 mcg total) by mouth daily. 30 tablet 0   Melatonin 2.5 MG CAPS Take 1 capsule by mouth at bedtime. (Patient not taking: Reported on 12/08/2022)     meloxicam (MOBIC) 15 MG tablet Take 1 tablet (15 mg total) by mouth daily as needed for pain. 30 tablet 1   memantine (NAMENDA) 10 MG tablet Take 1 tablet (10 mg total)  by mouth 2 (two) times daily. 60 tablet 12   Multiple Vitamins-Minerals (CENTRUM SILVER PO) Take 1 tablet by mouth daily.     nitroGLYCERIN (NITROSTAT) 0.4 MG SL tablet Place 1 tablet (0.4 mg total) under the tongue every 5 (five) minutes as needed for chest pain. 30 tablet 0   Omega-3 Fatty Acids (FISH OIL) 1000 MG CAPS Take 1 capsule by mouth daily.     PROAIR HFA 108 (90 Base) MCG/ACT inhaler Inhale 2 puffs into the lungs every 4 (four) hours as needed for shortness of breath. 18 g 1   RESTASIS 0.05 % ophthalmic emulsion Place 2 drops into both eyes 2 (two) times daily. For dry eyes     rosuvastatin (CRESTOR) 10 MG tablet Take 1 tablet (10 mg total) by mouth at bedtime. (Patient not taking: Reported on 09/03/2022) 90 tablet 3   No current facility-administered medications for this visit.    REVIEW OF SYSTEMS:  .10 Point review of Systems was done is negative except as noted above.  PHYSICAL EXAMINATION:  ECOG PERFORMANCE STATUS: 2 - Symptomatic, <50% confined to bed  Vitals:   01/01/23 0948  BP: (!) 148/68  Pulse: 63  Resp: 18  Temp: (!) 97.2 F (36.2 C)  SpO2: 99%  Weight: 125 lb 11.2 oz (57 kg)   . GENERAL:alert, in no acute distress and comfortable SKIN: no acute rashes, no significant lesions EYES: conjunctiva are pink and non-injected, sclera anicteric OROPHARYNX: MMM, no exudates, no oropharyngeal erythema or ulceration NECK: supple, no JVD LYMPH:   no palpable lymphadenopathy in the cervical, axillary or inguinal regions LUNGS: clear to auscultation b/l with normal respiratory effort HEART: regular rate & rhythm ABDOMEN:  normoactive bowel sounds , non tender, not distended. Extremity: no pedal edema PSYCH: alert & oriented x 3 with fluent speech NEURO: no focal motor/sensory deficits   LABORATORY DATA:  I have reviewed the data as listed   .    Latest Ref Rng & Units 01/01/2023    9:16 AM 07/02/2022    9:39 AM 12/26/2021   12:03 PM  CBC  WBC 4.0 - 10.5 K/uL 7.9  6.9  10.5   Hemoglobin 12.0 - 15.0 g/dL 12.7  12.4  12.3   Hematocrit 36.0 - 46.0 % 38.2  37.3  38.3   Platelets 150 - 400 K/uL 385  365  448    .CBC    Component Value Date/Time   WBC 7.9 01/01/2023 0916   WBC 7.4 06/27/2021 1050   RBC 4.32 01/01/2023 0916   HGB 12.7 01/01/2023 0916   HGB 12.7 08/11/2021 1629   HGB 11.6 10/07/2017 1115   HCT 38.2 01/01/2023 0916   HCT 37.9 08/11/2021 1629   HCT 35.9 10/07/2017 1115   PLT 385 01/01/2023 0916   PLT 358 08/11/2021 1629   MCV 88.4 01/01/2023 0916   MCV 87 08/11/2021 1629   MCV 88.0 10/07/2017 1115   MCH 29.4 01/01/2023 0916   MCHC 33.2 01/01/2023 0916   RDW 14.3 01/01/2023 0916   RDW 13.0 08/11/2021 1629   RDW 15.0 (H) 10/07/2017 1115   LYMPHSABS 1.4 01/01/2023 0916   LYMPHSABS 1.4 08/11/2021 1629   LYMPHSABS 1.4 10/07/2017 1115   MONOABS 0.6 01/01/2023 0916   MONOABS 0.5 10/07/2017 1115   EOSABS 1.5 (H) 01/01/2023 0916   EOSABS 1.0 (H) 08/11/2021 1629   BASOSABS 0.1 01/01/2023 0916   BASOSABS 0.1 08/11/2021 1629   BASOSABS 0.1 10/07/2017 1115    .  Latest Ref Rng & Units 01/01/2023    9:16 AM 07/02/2022    9:39 AM 04/17/2022   11:05 AM  CMP  Glucose 70 - 99 mg/dL 100  141  97   BUN 8 - 23 mg/dL '17  22  16   '$ Creatinine 0.44 - 1.00 mg/dL 1.02  1.27  0.94   Sodium 135 - 145 mmol/L 139  141  139   Potassium 3.5 - 5.1 mmol/L 4.1  3.9  4.9   Chloride 98 - 111 mmol/L 104  107  101   CO2 22 - 32  mmol/L '28  26  26   '$ Calcium 8.9 - 10.3 mg/dL 9.5  9.5  9.6   Total Protein 6.5 - 8.1 g/dL 7.2  7.3  6.9   Total Bilirubin 0.3 - 1.2 mg/dL 0.4  0.9  0.3   Alkaline Phos 38 - 126 U/L 66  51  66   AST 15 - 41 U/L '24  25  28   '$ ALT 0 - 44 U/L '19  19  22    25 '$ OH Vit d levels 38.61  RADIOGRAPHIC STUDIES: I have personally reviewed the radiological images as listed and agreed with the findings in the report. No results found.   ASSESSMENT & PLAN:   89 y.o. Caucasian female with ECOG performance status of 2 with  1) history of classical Hodgkin's lymphoma of nodular sclerosis variety At least Stage IIIB some concern for possible Rt lung involvement which makes it Stage IVB Presented with right neck lymph nodes . She had type B constitutional symptoms with 15-20 pound weight loss night sweats or chills .noted to have significant pruritus with mild rash likely from Hodgkin's which remain resolved  Wt Readings from Last 3 Encounters:  12/08/22 127 lb 8 oz (57.8 kg)  09/03/22 124 lb 2 oz (56.3 kg)  07/23/22 125 lb (56.7 kg)  PET/CT scan after 5 cycles shows marked response to chemotherapy and stable predominantly [place] 2 as per discussion with the radiology.  S/p 5 cycles of AVD with last Cycle of AVD on 05/19/17  PLAN:  -discussed lab results on 01/01/23 with patient. Eosinophil high. WBC of 7.9 k, hemoglobin of 12.7 k, platelets 385 k. Light counts normal CBC okay beside mild chronic eosinophilia. CMP stable Patient no lab or clinical evidence of Hodgkins lymphoma recurrence at t h -switch to Zometa once a year after today's dose  3) T10 compression fracture based on imaging done on 05/22/2017 in ED Now status post kyphoplasty with significant improvement in her back pain  4) Osteoporosis PET/CT scan also shows possible L4 compression fracture . These compression fractures appear to be related to osteoporosis . Also had rib fracture related to no significant trauma She was  previously on Fosamax that cause generalized body aches . PLAN:  -25-hydroxy vitamin D levels at 38 -maintain compliance with current dose of vitamin D of 5000 units daily of D3 and recheck levels with PCP in 3-4 months and increase dose to achieve 25OH vit D levels of 60-90. -discussed with patient and changing Zometa every 12 months. -Continue to optimize thyroid replacement with primary care physician. -Fall precautions  5) h/o triple-vessel coronary artery disease based on cardiac cath: patient is surprisingly asymptomatic. -Continue management as per PCP and cardiology.  6) . Patient Active Problem List   Diagnosis Date Noted   Dysfunction of right eustachian tube 05/12/2022   Presbycusis of both ears 05/12/2022   Cough 02/15/2022   Headache  above the eye region 10/29/2021   Acute non-recurrent pansinusitis 10/29/2021   Vasomotor rhinitis 10/29/2021   Stage 3a chronic kidney disease ([place]) 12/12/2019   Degenerative disc disease, lumbar- MRI 6/ 2018 08/03/2019   Neuropathy involving both lower extremities- due to DDD of L-Spine 08/03/2019   Elevated serum creatinine 08/03/2019   Prediabetes 06/20/2019   Hyperlipidemia 06/20/2019   History of CVA (cerebrovascular accident)- 2017-   05/31/2019   Syncope and collapse 05/31/2019   Blistering rash-new onset right palmar surface hand- at base esp btwn fingers 05/31/2019   Maceration of skin 05/31/2019   Degeneration of lumbar intervertebral disc 04/11/2019   Lumbar radiculopathy 04/11/2019   Pain of both hip joints 03/17/2019   Localized, primary osteoarthritis 12/07/2018   Cramps, muscle, general-mostly bilateral lower extremities, worse at night but not just nocturnal 12/07/2018   Pain in finger of right hand 11/30/2018   Sleep difficulties 09/11/2018   Muscle cramps at night 08/10/2018   Arthritis 08/10/2018   Dehydration, mild 08/10/2018   S/P kyphoplasty- dr brooks- ortho 04/05/2018   Postmenopausal 04/05/2018   Closed  fracture of multiple ribs 04/05/2018   Allergic contact dermatitis due to cosmetics 01/26/2018   Trigger finger, acquired- R 3rd digit 01/26/2018   Osteoarthritis of finger 01/26/2018   Vitamin D deficiency 11/24/2017   Glucose intolerance (impaired glucose tolerance) 11/24/2017   Osteoarthritis 09/16/2017   History of iron deficiency anemia-  by ONC 07/23/2017   Osteoporosis 07/23/2017   CAD S/P percutaneous coronary angioplasty 07/02/2017   Closed compression fracture of L2 lumbar vertebra 06/24/2017   Abnormal nuclear cardiac imaging test 06/16/2017   Malnutrition of moderate degree 04/27/2017   Symptomatic anemia 04/26/2017   Hodgkin lymphoma ([place]) 04/26/2017   Port catheter in place 02/02/2017   Nodular sclerosis Hodgkin lymphoma of lymph nodes of neck ([place]) 01/13/2017   Dyspnea 09/14/2012   Epistaxis 06/02/2011   HYPERCHOLESTEROLEMIA, MIXED 01/08/2009   Essential hypertension, benign 01/08/2009   Coronary atherosclerosis- sees Dr. Johnsie Cancel 01/08/2009   Hypothyroidism 01/08/2009   PLAN: -Continue follow-up with primary care physician for management of other chronic medical co-morbidities.   FOLLOW UP:RTC with Dr. [name] with labs and next dose of Zometa in 12 months.  The total time spent in the appointment was 23 minutes* .  All of the patient's questions were answered with apparent satisfaction. The patient knows to call the clinic with any problems, questions or concerns.  Sullivan Lone MD MS AAHIVMS [facility] Hematology/Oncology Physician [facility]  .*Total Encounter Time as defined by the Centers for Medicare and Medicaid Services includes, in addition to the face-to-face time of a patient visit (documented in the note above) non-face-to-face time: obtaining and reviewing outside history, ordering and reviewing medications, tests or procedures, care coordination (communications with other health care professionals or caregivers) and documentation in the medical  record.  I,Mitra Faeizi,acting as a Education administrator for Sullivan Lone, MD.,have documented all relevant documentation on the behalf of Sullivan Lone, MD,as directed by  Sullivan Lone, MD while in the presence of Sullivan Lone, MD.  .I have reviewed the above documentation for accuracy and completeness, and I agree with the above. Brunetta Genera MD

## 2023-01-06 ENCOUNTER — Ambulatory Visit
Admission: RE | Admit: 2023-01-06 | Discharge: 2023-01-06 | Disposition: A | Discharge status: Home / Self Care | Payer: Medicare PPO | Source: Ambulatory Visit | Attending: Physician Assistant | Admitting: Physician Assistant

## 2023-01-06 DIAGNOSIS — Z1231 Encounter for screening mammogram for malignant neoplasm of breast: Secondary | ICD-10-CM

## 2023-01-07 ENCOUNTER — Encounter: Payer: Self-pay | Admitting: Hematology

## 2023-01-20 ENCOUNTER — Other Ambulatory Visit: Payer: Self-pay | Admitting: Nurse Practitioner

## 2023-01-20 DIAGNOSIS — E039 Hypothyroidism, unspecified: Secondary | ICD-10-CM

## 2023-02-01 ENCOUNTER — Encounter: Payer: Self-pay | Admitting: Family Medicine

## 2023-02-01 ENCOUNTER — Encounter: Payer: Medicare PPO | Admitting: Physician Assistant

## 2023-02-01 ENCOUNTER — Ambulatory Visit (INDEPENDENT_AMBULATORY_CARE_PROVIDER_SITE_OTHER): Payer: Medicare PPO | Admitting: Family Medicine

## 2023-02-01 VITALS — BP 137/78 | HR 70 | Resp 18 | Ht 61.0 in | Wt 123.0 lb

## 2023-02-01 DIAGNOSIS — Z0000 Encounter for general adult medical examination without abnormal findings: Secondary | ICD-10-CM

## 2023-02-01 DIAGNOSIS — E559 Vitamin D deficiency, unspecified: Secondary | ICD-10-CM

## 2023-02-01 DIAGNOSIS — R7302 Impaired glucose tolerance (oral): Secondary | ICD-10-CM | POA: Diagnosis not present

## 2023-02-01 DIAGNOSIS — M810 Age-related osteoporosis without current pathological fracture: Secondary | ICD-10-CM

## 2023-02-01 DIAGNOSIS — E039 Hypothyroidism, unspecified: Secondary | ICD-10-CM

## 2023-02-01 DIAGNOSIS — R7303 Prediabetes: Secondary | ICD-10-CM

## 2023-02-01 DIAGNOSIS — I10 Essential (primary) hypertension: Secondary | ICD-10-CM | POA: Diagnosis not present

## 2023-02-01 DIAGNOSIS — E785 Hyperlipidemia, unspecified: Secondary | ICD-10-CM

## 2023-02-01 NOTE — Progress Notes (Deleted)
Subjective:   Kim Lane is a 89 y.o. female who presents for Medicare Annual (Subsequent) preventive examination.  Overall she is doing well.  Her only concern today is that she sometimes has trouble sleeping.  She does take an over-the-counter medication that she does not know the name of but states that it comes in a purple bottle.  She states that this medication works well enough for her at this time, she will send [place] a message to discuss other options if it ever becomes more of an issue.  Review of Systems    See HPI       Objective:    Today's Vitals   02/01/23 1031  BP: 137/78  Pulse: 70  Resp: 18  SpO2: 99%  Weight: 123 lb (55.8 kg)   Body mass index is 23.24 kg/m.     02/01/2023   10:22 AM 12/04/2020   10:28 AM 05/06/2020   11:29 AM 01/05/2019    8:46 AM 10/28/2017   11:33 AM 07/07/2017   11:29 AM 07/02/2017    8:53 AM  Advanced Directives  Does Patient Have a Medical Advance Directive? Yes Yes Yes Yes Yes Yes Yes  Type of Advance Directive Living will [place];Living will [place];Living will [place];Living will [place];Living will [place];Living will [place];Living will  Does patient want to make changes to medical advance directive? No - Patient declined  No - Patient declined No - Patient declined No - Patient declined  No - Patient declined  Copy of [place] in Chart?  No - copy requested No - copy requested  Yes Yes     Current Medications (verified) Outpatient Encounter Medications as of 02/01/2023  Medication Sig   aspirin 81 MG tablet Take 81 mg by mouth every other day.   carvedilol (COREG) 3.125 MG tablet TAKE 1 TABLET BY MOUTH EVERY OTHER MORNING   Cholecalciferol (HM VITAMIN D3) 100 MCG (4000 UT) CAPS 4,000 IU QD   diclofenac Sodium (VOLTAREN) 1 % GEL Apply 2 g topically 4 (four) times daily. To affected  joint for pain prn   levothyroxine (SYNTHROID) 100 MCG tablet TAKE 1 TABLET BY MOUTH DAILY   Melatonin 2.5 MG CAPS Take 1 capsule by mouth at bedtime.   meloxicam (MOBIC) 15 MG tablet Take 1 tablet (15 mg total) by mouth daily as needed for pain.   memantine (NAMENDA) 10 MG tablet Take 1 tablet (10 mg total) by mouth 2 (two) times daily.   Multiple Vitamins-Minerals (CENTRUM SILVER PO) Take 1 tablet by mouth daily.   nitroGLYCERIN (NITROSTAT) 0.4 MG SL tablet Place 1 tablet (0.4 mg total) under the tongue every 5 (five) minutes as needed for chest pain.   Omega-3 Fatty Acids (FISH OIL) 1000 MG CAPS Take 1 capsule by mouth daily.   PROAIR HFA 108 (90 Base) MCG/ACT inhaler Inhale 2 puffs into the lungs every 4 (four) hours as needed for shortness of breath.   RESTASIS 0.05 % ophthalmic emulsion Place 2 drops into both eyes 2 (two) times daily. For dry eyes   rosuvastatin (CRESTOR) 10 MG tablet Take 1 tablet (10 mg total) by mouth at bedtime.   [DISCONTINUED] levothyroxine (SYNTHROID) 112 MCG tablet TAKE 1 TABLET BY MOUTH DAILY (Patient not taking: Reported on 04/22/2022)   No facility-administered encounter medications on file as of 02/01/2023.    Allergies (verified) Fosamax [alendronate sodium]  History: Past Medical History:  Diagnosis Date   Anemia    Anginal pain ([place])    occ   Arthritis    Blood dyscrasia 12/2016   hodgins lymphoma tx-remission   CAD (coronary artery disease) 02/2006   Taxus stent placed to LAD and Diagonal per Dr. Olevia Perches   Cataracts, bilateral    Complication of anesthesia    HTN (hypertension)    Hypercholesterolemia    Hypothyroidism    IBS (irritable bowel syndrome)    Osteoarthritis    PONV (postoperative nausea and vomiting)    Stroke ([place]) 03/2016   Past Surgical History:  Procedure Laterality Date   APPENDECTOMY     IR GENERIC HISTORICAL  01/25/2017   IR US GUIDE VASC ACCESS RIGHT 01/25/2017 [name], MD [facility]   IR GENERIC HISTORICAL   01/25/2017   IR FLUORO GUIDE PORT INSERTION RIGHT 01/25/2017 [name], MD [facility]   IR REMOVAL TUN ACCESS W/ PORT W/O FL MOD SED  10/28/2017   KYPHOPLASTY N/A 06/24/2017   Procedure: KYPHOPLASTY T10;  Surgeon: Melina Schools, MD;  Location: [facility];  Service: Orthopedics;  Laterality: N/A;  90 mins   LEFT HEART CATH AND CORONARY ANGIOGRAPHY N/A 06/16/2017   Procedure: Left Heart Cath and Coronary Angiography;  Surgeon: Sherren Mocha, MD;  Location: [facility];  Service: Cardiovascular;  Laterality: N/A;   MASS EXCISION Right 12/22/2016   Procedure: RIGHT NECK LYMPH NODE BIOPSY;  Surgeon: Rozetta Nunnery, MD;  Location: [facility];  Service: ENT;  Laterality: Right;   SPINE SURGERY     stents     in heart   TONSILLECTOMY     TOTAL KNEE ARTHROPLASTY Bilateral 2011,2012   VAGINAL HYSTERECTOMY     Family History  Problem Relation Age of Onset   Diabetes Mother    Dementia Mother    ALS Mother    Heart Problems Father    Liver disease Sister    Cirrhosis Sister    Diabetes Sister    Dementia Sister    Other Brother        GOOD HEALTH   Other Daughter        GOOD HEALTH   Bone cancer Son    Heart block Son        CABG   Heart block Other        CABG   Breast cancer Neg Hx    Social History   Socioeconomic History   Marital status: Widowed    Spouse name: Not on file   Number of children: 2   Years of education: 12   Highest education level: Not on file  Occupational History    Employer: [employer], retired  Tobacco Use   Smoking status: Former   Smokeless tobacco: Never   Tobacco comments:    quit smoking 30 years ago, very light smoker  Vaping Use   Vaping Use: Never used  Substance and Sexual Activity   Alcohol use: No    Alcohol/week: 0.0 standard drinks of alcohol   Drug use: No   Sexual activity: Not on file  Other Topics Concern   Not on file  Social History Narrative   08/29/19 Lives alone    caffeine use- coffee- 5 cups daily   Social Determinants of Health   Financial Resource Strain: Low Risk  (02/01/2023)   Overall Financial Resource Strain (CARDIA)    Difficulty of Paying Living Expenses:  Not hard at all  Food Insecurity: Unknown (02/01/2023)   Hunger Vital Sign    Worried About Running Out of Food in the Last Year: Not on file    Ran Out of Food in the Last Year: Never true  Transportation Needs: No Transportation Needs (02/01/2023)   PRAPARE - Hydrologist (Medical): No    Lack of Transportation (Non-Medical): No  Physical Activity: Inactive (02/01/2023)   Exercise Vital Sign    Days of Exercise per Week: 1 day    Minutes of Exercise per Session: 0 min  Stress: No Stress Concern Present (02/01/2023)   [place]    Feeling of Stress : Not at all  Social Connections: Moderately Integrated (02/01/2023)   Social Connection and Isolation Panel [NHANES]    Frequency of Communication with Friends and Family: More than three times a week    Frequency of Social Gatherings with Friends and Family: More than three times a week    Attends Religious Services: More than 4 times per year    Active Member of [employer] or Organizations: Yes    Attends Archivist Meetings: More than 4 times per year    Marital Status: Widowed    Tobacco Counseling Counseling given: Not Answered Tobacco comments: quit smoking 30 years ago, very light smoker   Clinical Intake:                 Diabetic? No- prediabetic, will recheck HbA1C today         Activities of Daily Living    07/23/2022    9:31 AM 04/17/2022   10:36 AM  In your present state of health, do you have any difficulty performing the following activities:  Hearing? 1 1  Vision? 0 1  Difficulty concentrating or making decisions? 1 0  Walking or climbing stairs? 1 1  Dressing or bathing? 0 0  Doing errands,  shopping? 0 0    Patient Care Team: Lorrene Reid, PA-C as PCP - General Josue Hector, MD as PCP - Cardiology (Cardiology) Josue Hector, MD as Consulting Physician (Cardiology) Leta Baptist, Earlean Polka, MD as Consulting Physician (Neurology) Jarome Matin, MD as Consulting Physician (Dermatology) Rozetta Nunnery, MD (Inactive) as Consulting Physician (Otolaryngology) Brunetta Genera, MD as Consulting Physician (Hematology) Melina Schools, MD as Consulting Physician (Orthopedic Surgery) Renette Butters, MD as Attending Physician (Orthopedic Surgery)  Indicate any recent Medical Services you may have received from other than [facility] providers in the past year (date may be approximate).     Assessment:   This is a routine wellness examination for [place].  Hearing/Vision screen Hearing Screening - Comments:: Patient wears hearing aide bilateral  Vision Screening - Comments:: Last visit 7/23 Dr. Leeanne Deed in [place], [place]. Pt has prescription glasses.  Dietary issues and exercise activities discussed:     Goals Addressed   None    Depression Screen    07/23/2022    9:30 AM 04/17/2022   10:36 AM 02/12/2022    2:06 PM 12/18/2021    2:19 PM 10/17/2021   10:23 AM 08/11/2021    4:51 PM 02/17/2021    9:33 AM  PHQ 2/9 Scores  PHQ - 2 Score 1 1 0 2 1 0 0  PHQ- 9 Score 6 8 6 7 7 4 4    $ Fall Risk    02/01/2023   10:25 AM 07/23/2022    9:30 AM 04/17/2022  10:36 AM 12/18/2021    2:18 PM 10/17/2021   10:22 AM  Fall Risk   Falls in the past year? 1 1 1 $ 0 0  Number falls in past yr: 1 0 0 0 0  Injury with Fall? 1 0 0 0 0  Risk for fall due to :  Impaired balance/gait;Impaired mobility History of fall(s) No Fall Risks No Fall Risks  Follow up  Falls evaluation completed Falls evaluation completed Falls evaluation completed Falls evaluation completed    [place]:  Any stairs in or around the home? {YES/NO:21197} If so, are there any without  handrails? {YES/NO:21197} Home free of loose throw rugs in walkways, pet beds, electrical cords, etc? {YES/NO:21197} Adequate lighting in your home to reduce risk of falls? {YES/NO:21197}  ASSISTIVE DEVICES UTILIZED TO PREVENT FALLS:  Life alert? {YES/NO:21197} Use of a cane, walker or w/c? {YES/NO:21197} Grab bars in the bathroom? {YES/NO:21197} Shower chair or bench in shower? {YES/NO:21197} Elevated toilet seat or a handicapped toilet? {YES/NO:21197}  TIMED UP AND GO:  Was the test performed? {YES/NO:21197}.  Length of time to ambulate 10 feet: *** sec.   {Appearance of YN:9739091  Cognitive Function:    09/03/2022    3:28 PM  MMSE - Mini Mental State Exam  Orientation to time 5  Orientation to Place 5  Registration 3  Attention/ Calculation 5  Recall 2  Language- name 2 objects 2  Language- repeat 1  Language- follow 3 step command 3  Language- read & follow direction 1  Write a sentence 1  Copy design 1  Total score 29        02/01/2023   10:20 AM 07/23/2022    9:27 AM 10/17/2021   10:12 AM 10/17/2020    9:10 AM  6CIT Screen  What Year? 0 points 0 points 0 points 0 points  What month? 0 points 0 points 0 points 0 points  What time? 0 points 0 points 0 points 0 points  Count back from 20 0 points 0 points 0 points 0 points  Months in reverse 0 points 0 points 0 points 0 points  Repeat phrase 0 points 0 points 0 points 0 points  Total Score 0 points 0 points 0 points 0 points    Immunizations Immunization History  Administered Date(s) Administered   Influenza-Unspecified 09/20/2013, 09/19/2014, 09/21/2015, 09/22/2017   PFIZER(Purple Top)SARS-COV-2 Vaccination 01/26/2020, 02/20/2020   Pneumococcal Conjugate-13 09/08/2016   Pneumococcal-Unspecified 12/30/2010   Td 12/01/2007   Tdap 10/28/2018   Zoster, Live 11/30/2008   She states that she typically gets her immunizations done at the [facility].  {TDAP status:2101805}  {Flu Vaccine  status:2101806}  {Pneumococcal vaccine status:2101807}  {Covid-19 vaccine status:2101808}  Qualifies for Shingles Vaccine? {YES/NO:21197}  Zostavax completed {YES/NO:21197}  {Shingrix Completed?:2101804}  Screening Tests Health Maintenance  Topic Date Due   Zoster Vaccines- Shingrix (1 of 2) Never done   Pneumonia Vaccine 95+ Years old (2 of 2 - PPSV23 or PCV20) 11/03/2016   COVID-19 Vaccine (3 - Pfizer risk series) 03/19/2020   INFLUENZA VACCINE  07/21/2022   Medicare Annual Wellness (AWV)  02/02/2024   DTaP/Tdap/Td (3 - Td or Tdap) 10/28/2028   DEXA SCAN  Completed   HPV VACCINES  Aged Out    Health Maintenance  Health Maintenance Due  Topic Date Due   Zoster Vaccines- Shingrix (1 of 2) Never done   Pneumonia Vaccine 51+ Years old (2 of 2 - PPSV23 or PCV20) 11/03/2016  COVID-19 Vaccine (3 - Pfizer risk series) 03/19/2020   INFLUENZA VACCINE  07/21/2022    [place] was done in 2017.  Results were normal.  {Mammogram status:21018020}  {Bone Density status:21018021}  Lung Cancer Screening: (Low Dose CT Chest recommended if Age 2-80 years, 30 pack-year currently smoking OR have quit w/in 15years.) {DOES NOT does:27190::"does not"} qualify.   Lung Cancer Screening Referral: ***  Additional Screening:  Hepatitis C Screening: {DOES NOT does:27190::"does not"} qualify; Completed ***  Vision Screening: Recommended annual ophthalmology exams for early detection of glaucoma and other disorders of the eye. Is the patient up to date with their annual eye exam?  {YES/NO:21197} Who is the provider or what is the name of the office in which the patient attends annual eye exams? *** If pt is not established with a provider, would they like to be referred to a provider to establish care? {YES/NO:21197}.   Dental Screening: Recommended annual dental exams for proper oral hygiene  Community Resource Referral / Chronic Care Management: CRR required this visit?   {YES/NO:21197}  CCM required this visit?  {YES/NO:21197}     Plan:     I have personally reviewed and noted the following in the patient's chart:   Medical and social history Use of alcohol, tobacco or illicit drugs  Current medications and supplements including opioid prescriptions. {Opioid Prescriptions:[id number]} Functional ability and status Nutritional status Physical activity Advanced directives List of other physicians Hospitalizations, surgeries, and ER visits in previous 12 months Vitals Screenings to include cognitive, depression, and falls Referrals and appointments  In addition, I have reviewed and discussed with patient certain preventive protocols, quality metrics, and best practice recommendations. A written personalized care plan for preventive services as well as general preventive health recommendations were provided to patient.     Velva Harman, PA   02/01/2023   Nurse Notes: ***

## 2023-02-01 NOTE — Progress Notes (Signed)
Subjective:   Kim Lane is a 89 y.o. female who presents for Medicare Annual (Subsequent) preventive examination.  Overall she is doing well.  She does struggle to sleep sometimes, but she has been taking an over-the-counter medication.  She does not remember the name of the medication but it does come in a purple bottle.  She says right now it is working well for her and she does not need to discuss other options but she is aware that she can come back if the problem gets any worse.  Review of Systems    See HPI       Objective:    Today's Vitals   02/01/23 1031  BP: 137/78  Pulse: 70  Resp: 18  SpO2: 99%  Weight: 123 lb (55.8 kg)  Height: 5' 1"$  (1.549 m)   Body mass index is 23.24 kg/m.     02/01/2023   10:22 AM 12/04/2020   10:28 AM 05/06/2020   11:29 AM 01/05/2019    8:46 AM 10/28/2017   11:33 AM 07/07/2017   11:29 AM 07/02/2017    8:53 AM  Advanced Directives  Does Patient Have a Medical Advance Directive? Yes Yes Yes Yes Yes Yes Yes  Type of Advance Directive Living will [place];Living will [place];Living will [place];Living will [place];Living will [place];Living will [place];Living will  Does patient want to make changes to medical advance directive? No - Patient declined  No - Patient declined No - Patient declined No - Patient declined  No - Patient declined  Copy of [place] in Chart?  No - copy requested No - copy requested  Yes Yes     Current Medications (verified) Outpatient Encounter Medications as of 02/01/2023  Medication Sig   aspirin 81 MG tablet Take 81 mg by mouth every other day.   carvedilol (COREG) 3.125 MG tablet TAKE 1 TABLET BY MOUTH EVERY OTHER MORNING   Cholecalciferol (HM VITAMIN D3) 100 MCG (4000 UT) CAPS 4,000 IU QD   diclofenac Sodium (VOLTAREN) 1 % GEL Apply 2 g topically 4 (four) times daily.  To affected joint for pain prn   levothyroxine (SYNTHROID) 100 MCG tablet TAKE 1 TABLET BY MOUTH DAILY   Melatonin 2.5 MG CAPS Take 1 capsule by mouth at bedtime.   meloxicam (MOBIC) 15 MG tablet Take 1 tablet (15 mg total) by mouth daily as needed for pain.   memantine (NAMENDA) 10 MG tablet Take 1 tablet (10 mg total) by mouth 2 (two) times daily.   Multiple Vitamins-Minerals (CENTRUM SILVER PO) Take 1 tablet by mouth daily.   nitroGLYCERIN (NITROSTAT) 0.4 MG SL tablet Place 1 tablet (0.4 mg total) under the tongue every 5 (five) minutes as needed for chest pain.   Omega-3 Fatty Acids (FISH OIL) 1000 MG CAPS Take 1 capsule by mouth daily.   PROAIR HFA 108 (90 Base) MCG/ACT inhaler Inhale 2 puffs into the lungs every 4 (four) hours as needed for shortness of breath.   RESTASIS 0.05 % ophthalmic emulsion Place 2 drops into both eyes 2 (two) times daily. For dry eyes   rosuvastatin (CRESTOR) 10 MG tablet Take 1 tablet (10 mg total) by mouth at bedtime.   [DISCONTINUED] levothyroxine (SYNTHROID) 112 MCG tablet TAKE 1 TABLET BY MOUTH DAILY (Patient not taking: Reported on 04/22/2022)   No facility-administered encounter medications on file as of 02/01/2023.    Allergies (  verified) Fosamax [alendronate sodium]   History: Past Medical History:  Diagnosis Date   Anemia    Anginal pain ([place])    occ   Arthritis    Blood dyscrasia 12/2016   hodgins lymphoma tx-remission   CAD (coronary artery disease) 02/2006   Taxus stent placed to LAD and Diagonal per Dr. Olevia Perches   Cataracts, bilateral    Complication of anesthesia    HTN (hypertension)    Hypercholesterolemia    Hypothyroidism    IBS (irritable bowel syndrome)    Osteoarthritis    PONV (postoperative nausea and vomiting)    Stroke ([place]) 03/2016   Past Surgical History:  Procedure Laterality Date   APPENDECTOMY     IR GENERIC HISTORICAL  01/25/2017   IR US GUIDE VASC ACCESS RIGHT 01/25/2017 [name], MD [facility]   IR GENERIC  HISTORICAL  01/25/2017   IR FLUORO GUIDE PORT INSERTION RIGHT 01/25/2017 [name], MD [facility]   IR REMOVAL TUN ACCESS W/ PORT W/O FL MOD SED  10/28/2017   KYPHOPLASTY N/A 06/24/2017   Procedure: KYPHOPLASTY T10;  Surgeon: Melina Schools, MD;  Location: [facility];  Service: Orthopedics;  Laterality: N/A;  90 mins   LEFT HEART CATH AND CORONARY ANGIOGRAPHY N/A 06/16/2017   Procedure: Left Heart Cath and Coronary Angiography;  Surgeon: Sherren Mocha, MD;  Location: [facility];  Service: Cardiovascular;  Laterality: N/A;   MASS EXCISION Right 12/22/2016   Procedure: RIGHT NECK LYMPH NODE BIOPSY;  Surgeon: Rozetta Nunnery, MD;  Location: [facility];  Service: ENT;  Laterality: Right;   SPINE SURGERY     stents     in heart   TONSILLECTOMY     TOTAL KNEE ARTHROPLASTY Bilateral 2011,2012   VAGINAL HYSTERECTOMY     Family History  Problem Relation Age of Onset   Diabetes Mother    Dementia Mother    ALS Mother    Heart Problems Father    Liver disease Sister    Cirrhosis Sister    Diabetes Sister    Dementia Sister    Other Brother        GOOD HEALTH   Other Daughter        GOOD HEALTH   Bone cancer Son    Heart block Son        CABG   Heart block Other        CABG   Breast cancer Neg Hx    Social History   Socioeconomic History   Marital status: Widowed    Spouse name: Not on file   Number of children: 2   Years of education: 12   Highest education level: Not on file  Occupational History    Employer: [employer], retired  Tobacco Use   Smoking status: Former    Passive exposure: Never   Smokeless tobacco: Never   Tobacco comments:    quit smoking 30 years ago, very light smoker  Vaping Use   Vaping Use: Never used  Substance and Sexual Activity   Alcohol use: No    Alcohol/week: 0.0 standard drinks of alcohol   Drug use: No   Sexual activity: Not on file  Other Topics Concern   Not on file  Social  History Narrative   08/29/19 Lives alone   caffeine use- coffee- 5 cups daily   Social Determinants of Health   Financial Resource Strain: Low Risk  (02/01/2023)   Overall  Financial Resource Strain (CARDIA)    Difficulty of Paying Living Expenses: Not hard at all  Food Insecurity: Unknown (02/01/2023)   Hunger Vital Sign    Worried About Running Out of Food in the Last Year: Not on file    Ran Out of Food in the Last Year: Never true  Transportation Needs: No Transportation Needs (02/01/2023)   PRAPARE - Hydrologist (Medical): No    Lack of Transportation (Non-Medical): No  Physical Activity: Inactive (02/01/2023)   Exercise Vital Sign    Days of Exercise per Week: 1 day    Minutes of Exercise per Session: 0 min  Stress: No Stress Concern Present (02/01/2023)   [place]    Feeling of Stress : Not at all  Social Connections: Moderately Integrated (02/01/2023)   Social Connection and Isolation Panel [NHANES]    Frequency of Communication with Friends and Family: More than three times a week    Frequency of Social Gatherings with Friends and Family: More than three times a week    Attends Religious Services: More than 4 times per year    Active Member of [employer] or Organizations: Yes    Attends Archivist Meetings: More than 4 times per year    Marital Status: Widowed    Tobacco Counseling Counseling given: Not Answered Tobacco comments: quit smoking 30 years ago, very light smoker   Clinical Intake:  Pre-visit preparation completed: No  Pain : No/denies pain     BMI - recorded: 23.2 Nutritional Status: BMI of 19-24  Normal Nutritional Risks: None Diabetes: No  How often do you need to have someone help you when you read instructions, pamphlets, or other written materials from your doctor or pharmacy?: 1 - Never  Diabetic? No  Interpreter Needed?: No  Information  entered by :: Leanord Asal, RMA   Activities of Daily Living    02/01/2023   11:26 AM 07/23/2022    9:31 AM  In your present state of health, do you have any difficulty performing the following activities:  Hearing? 1 1  Vision? 0 0  Difficulty concentrating or making decisions? 1 1  Walking or climbing stairs? 0 1  Dressing or bathing? 0 0  Doing errands, shopping? 0 0    Patient Care Team: Velva Harman, PA as PCP - General (Family Medicine) Josue Hector, MD as PCP - Cardiology (Cardiology) Josue Hector, MD as Consulting Physician (Cardiology) Leta Baptist, Earlean Polka, MD as Consulting Physician (Neurology) Jarome Matin, MD as Consulting Physician (Dermatology) Rozetta Nunnery, MD (Inactive) as Consulting Physician (Otolaryngology) Brunetta Genera, MD as Consulting Physician (Hematology) Melina Schools, MD as Consulting Physician (Orthopedic Surgery) Renette Butters, MD as Attending Physician (Orthopedic Surgery)  Indicate any recent Medical Services you may have received from other than [facility] providers in the past year (date may be approximate).     Assessment:   This is a routine wellness examination for [place].  Hearing/Vision screen Hearing Screening - Comments:: Patient wears hearing aide bilateral  Vision Screening - Comments:: Last visit 7/23 Dr. Leeanne Deed in [place], [place]. Pt has prescription glasses.  Dietary issues and exercise activities discussed:     Goals Addressed               This Visit's Progress     Patient Stated (pt-stated)        Patient would like to keep doing well as  she feels that she is doing great given that she is 89 years old.  She would like to start sleeping better.  She has an over-the-counter medication that she will continue using but she will come in if the problem gets any worse.      Depression Screen    02/01/2023   11:25 AM 07/23/2022    9:30 AM 04/17/2022   10:36 AM 02/12/2022    2:06 PM 12/18/2021    2:19  PM 10/17/2021   10:23 AM 08/11/2021    4:51 PM  PHQ 2/9 Scores  PHQ - 2 Score 0 1 1 0 2 1 0  PHQ- 9 Score 7 6 8 6 7 7 4    $ Fall Risk    02/01/2023   11:26 AM 02/01/2023   10:25 AM 07/23/2022    9:30 AM 04/17/2022   10:36 AM 12/18/2021    2:18 PM  Fall Risk   Falls in the past year? 1 1 1 1 $ 0  Number falls in past yr: 1 1 0 0 0  Injury with Fall? 1 1 0 0 0  Risk for fall due to :   Impaired balance/gait;Impaired mobility History of fall(s) No Fall Risks  Follow up   Falls evaluation completed Falls evaluation completed Falls evaluation completed    [place]:  Any stairs in or around the home? Yes  If so, are there any without handrails? No  Home free of loose throw rugs in walkways, pet beds, electrical cords, etc? Yes  Adequate lighting in your home to reduce risk of falls? Yes   ASSISTIVE DEVICES UTILIZED TO PREVENT FALLS:  Life alert? Yes  Use of a cane, walker or w/c? Yes  Grab bars in the bathroom? Yes  Shower chair or bench in shower? No  Elevated toilet seat or a handicapped toilet? Yes   TIMED UP AND GO:  Was the test performed? Yes .  Length of time to ambulate 10 feet: 10-15 sec.   Gait steady and fast with assistive device  Cognitive Function:    09/03/2022    3:28 PM  MMSE - Mini Mental State Exam  Orientation to time 5  Orientation to Place 5  Registration 3  Attention/ Calculation 5  Recall 2  Language- name 2 objects 2  Language- repeat 1  Language- follow 3 step command 3  Language- read & follow direction 1  Write a sentence 1  Copy design 1  Total score 29        02/01/2023   10:20 AM 07/23/2022    9:27 AM 10/17/2021   10:12 AM 10/17/2020    9:10 AM  6CIT Screen  What Year? 0 points 0 points 0 points 0 points  What month? 0 points 0 points 0 points 0 points  What time? 0 points 0 points 0 points 0 points  Count back from 20 0 points 0 points 0 points 0 points  Months in reverse 0 points 0 points 0  points 0 points  Repeat phrase 0 points 0 points 0 points 0 points  Total Score 0 points 0 points 0 points 0 points    Immunizations Immunization History  Administered Date(s) Administered   Influenza, Quadrivalent, Recombinant, Inj, Pf 09/30/2022   Influenza-Unspecified 09/20/2013, 09/19/2014, 09/21/2015, 09/22/2017   Moderna SARS-COV2 Booster Vaccination 09/30/2022   PFIZER(Purple Top)SARS-COV-2 Vaccination 01/26/2020, 02/20/2020   Pneumococcal Conjugate-13 09/08/2016   Pneumococcal-Unspecified 12/30/2010   Td 12/01/2007   Tdap 10/28/2018  Zoster, Live 11/30/2008    TDAP status: Up to date  Flu Vaccine status: Due, Education has been provided regarding the importance of this vaccine. Advised may receive this vaccine at local pharmacy or Health Dept. Aware to provide a copy of the vaccination record if obtained from local pharmacy or Health Dept. Verbalized acceptance and understanding.  Pneumococcal vaccine status: Due, Education has been provided regarding the importance of this vaccine. Advised may receive this vaccine at local pharmacy or Health Dept. Aware to provide a copy of the vaccination record if obtained from local pharmacy or Health Dept. Verbalized acceptance and understanding.  Covid-19 vaccine status: Information provided on how to obtain vaccines.   Qualifies for Shingles Vaccine? Yes   Zostavax completed No   Shingrix Completed?: No.    Education has been provided regarding the importance of this vaccine. Patient has been advised to call insurance company to determine out of pocket expense if they have not yet received this vaccine. Advised may also receive vaccine at local pharmacy or Health Dept. Verbalized acceptance and understanding.  Screening Tests Health Maintenance  Topic Date Due   COVID-19 Vaccine (3 - Pfizer risk series) 10/28/2022   Zoster Vaccines- Shingrix (1 of 2) 05/02/2023 (Originally 05/12/1953)   Pneumonia Vaccine 29+ Years old (2 of 2 -  PPSV23 or PCV20) 07/21/2023 (Originally 11/03/2016)   Medicare Annual Wellness (AWV)  02/02/2024   DTaP/Tdap/Td (3 - Td or Tdap) 10/28/2028   INFLUENZA VACCINE  Completed   DEXA SCAN  Completed   HPV VACCINES  Aged Out    Health Maintenance  Health Maintenance Due  Topic Date Due   COVID-19 Vaccine (3 - Pfizer risk series) 10/28/2022    Colorectal cancer screening: No longer required.   Mammogram status: Completed 01/07/2023. Repeat every year  Bone Density status: Completed 05/12/2018. Results reflect: Bone density results: OSTEOPENIA. Repeat every 2 years.  Lung Cancer Screening: (Low Dose CT Chest recommended if Age 40-80 years, 30 pack-year currently smoking OR have quit w/in 15years.) does qualify.     Additional Screening:  Hepatitis C Screening: does qualify; Completed 10/17/2020  Vision Screening: Recommended annual ophthalmology exams for early detection of glaucoma and other disorders of the eye. Is the patient up to date with their annual eye exam?  Yes  Who is the provider or what is the name of the office in which the patient attends annual eye exams? Dr. Jerilynn Som, [place] If pt is not established with a provider, would they like to be referred to a provider to establish care?  Patient is established .   Dental Screening: Recommended annual dental exams for proper oral hygiene  Community Resource Referral / Chronic Care Management: CRR required this visit?  No   CCM required this visit?  No      Plan:     I have personally reviewed and noted the following in the patient's chart:   Medical and social history Use of alcohol, tobacco or illicit drugs  Current medications and supplements including opioid prescriptions. Patient is not currently taking opioid prescriptions. Functional ability and status Nutritional status Physical activity Advanced directives List of other physicians Hospitalizations, surgeries, and ER visits in previous 12  months Vitals Screenings to include cognitive, depression, and falls Referrals and appointments  In addition, I have reviewed and discussed with patient certain preventive protocols, quality metrics, and best practice recommendations. A written personalized care plan for preventive services as well as general preventive health recommendations were provided to  patient.     Velva Harman, PA   02/01/2023   Nurse Notes: Face to Face 20 minutes

## 2023-02-02 LAB — CBC
Hematocrit: 38.2 % (ref 34.0–46.6)
Hemoglobin: 12.8 g/dL (ref 11.1–15.9)
MCH: 29 pg (ref 26.6–33.0)
MCHC: 33.5 g/dL (ref 31.5–35.7)
MCV: 86 fL (ref 79–97)
Platelets: 422 10*3/uL (ref 150–450)
RBC: 4.42 x10E6/uL (ref 3.77–5.28)
RDW: 12.7 % (ref 11.7–15.4)
WBC: 8 10*3/uL (ref 3.4–10.8)

## 2023-02-02 LAB — COMPREHENSIVE METABOLIC PANEL
ALT: 15 IU/L (ref 0–32)
AST: 20 IU/L (ref 0–40)
Albumin/Globulin Ratio: 1.7 (ref 1.2–2.2)
Albumin: 4.3 g/dL (ref 3.7–4.7)
Alkaline Phosphatase: 60 IU/L (ref 44–121)
BUN/Creatinine Ratio: 11 — ABNORMAL LOW (ref 12–28)
BUN: 13 mg/dL (ref 8–27)
Bilirubin Total: 0.3 mg/dL (ref 0.0–1.2)
CO2: 25 mmol/L (ref 20–29)
Calcium: 9.2 mg/dL (ref 8.7–10.3)
Chloride: 103 mmol/L (ref 96–106)
Creatinine, Ser: 1.16 mg/dL — ABNORMAL HIGH (ref 0.57–1.00)
Globulin, Total: 2.5 g/dL (ref 1.5–4.5)
Glucose: 100 mg/dL — ABNORMAL HIGH (ref 70–99)
Potassium: 4.8 mmol/L (ref 3.5–5.2)
Sodium: 141 mmol/L (ref 134–144)
Total Protein: 6.8 g/dL (ref 6.0–8.5)
eGFR: 46 mL/min/{1.73_m2} — ABNORMAL LOW (ref >59)

## 2023-02-02 LAB — LIPID PANEL
Chol/HDL Ratio: 3.9 ratio (ref 0.0–4.4)
Cholesterol, Total: 238 mg/dL — ABNORMAL HIGH (ref 100–199)
HDL: 61 mg/dL (ref >39)
LDL Chol Calc (NIH): 153 mg/dL — ABNORMAL HIGH (ref 0–99)
Triglycerides: 136 mg/dL (ref 0–149)
VLDL Cholesterol Cal: 24 mg/dL (ref 5–40)

## 2023-02-02 LAB — HEMOGLOBIN A1C
Est. average glucose Bld gHb Est-mCnc: 123 mg/dL
Hgb A1c MFr Bld: 5.9 % — ABNORMAL HIGH (ref 4.8–5.6)

## 2023-02-02 LAB — VITAMIN D 25 HYDROXY (VIT D DEFICIENCY, FRACTURES): Vit D, 25-Hydroxy: 40.6 ng/mL (ref 30.0–100.0)

## 2023-02-02 LAB — TSH: TSH: 0.266 u[IU]/mL — ABNORMAL LOW (ref 0.450–4.500)

## 2023-02-03 ENCOUNTER — Telehealth: Payer: Self-pay

## 2023-02-03 NOTE — Telephone Encounter (Signed)
I called patient to give her the result note per Lilia Pro:  The numbers in her blood work overall looked pretty consistent with where they were at over the past 7 months or so.  Her cholesterol was the only thing that was high compared to where she is usually at, so continue with the statin medication that she has and we will recheck these levels at her next follow-up appointment to make sure they go back down.  Pt understood and agrees to continue the statin Medication.  FYI

## 2023-02-03 NOTE — Telephone Encounter (Signed)
-----   Message from Velva Harman, [place] sent at 02/02/2023  5:21 PM EST ----- Whenever 1 of you get the chance would you mind calling her and letting her know that overall her labs looked okay.  The numbers in her blood work overall looked pretty consistent with where they were at over the past 7 months or so.  Her cholesterol was the only thing that was high compared to where she is usually at, so continue with the statin medication that she has and we will recheck these levels at her next follow-up appointment to make sure they go back down.  Thank you so much!!

## 2023-02-20 ENCOUNTER — Other Ambulatory Visit: Payer: Self-pay | Admitting: Nurse Practitioner

## 2023-02-20 DIAGNOSIS — E039 Hypothyroidism, unspecified: Secondary | ICD-10-CM

## 2023-03-24 ENCOUNTER — Other Ambulatory Visit: Payer: Self-pay | Admitting: Nurse Practitioner

## 2023-03-24 DIAGNOSIS — I10 Essential (primary) hypertension: Secondary | ICD-10-CM

## 2023-05-05 ENCOUNTER — Encounter: Payer: Self-pay | Admitting: Family Medicine

## 2023-05-05 ENCOUNTER — Ambulatory Visit: Payer: Medicare PPO | Admitting: Family Medicine

## 2023-05-05 VITALS — BP 119/73 | HR 78 | Ht 61.0 in | Wt 127.1 lb

## 2023-05-05 DIAGNOSIS — E785 Hyperlipidemia, unspecified: Secondary | ICD-10-CM | POA: Diagnosis not present

## 2023-05-05 DIAGNOSIS — R2689 Other abnormalities of gait and mobility: Secondary | ICD-10-CM

## 2023-05-05 DIAGNOSIS — W19XXXA Unspecified fall, initial encounter: Secondary | ICD-10-CM | POA: Diagnosis not present

## 2023-05-05 DIAGNOSIS — Z8673 Personal history of transient ischemic attack (TIA), and cerebral infarction without residual deficits: Secondary | ICD-10-CM

## 2023-05-05 MED ORDER — ROSUVASTATIN CALCIUM 5 MG PO TABS: 5.0000 mg | ORAL_TABLET | Freq: Every day | ORAL | 1 refills | Status: DC

## 2023-05-05 NOTE — Assessment & Plan Note (Signed)
Last lipid panel: LDL 153, HDL 61, triglycerides 136.  Patient states that she has not taken rosuvastatin in many years.  We discussed the importance of taking rosuvastatin and keeping cholesterol low to decrease her risk of any further strokes or other cardiovascular events.  Patient verbalized understanding and is agreeable to restarting rosuvastatin 5 mg daily.

## 2023-05-05 NOTE — Patient Instructions (Signed)
START Crestor 5mg  daily.

## 2023-05-05 NOTE — Progress Notes (Signed)
Acute Office Visit  Subjective:     Patient ID: Kim Lane, female    DOB: [birth year], 89 y.o.   MRN: [id number]  Chief Complaint  Patient presents with   balance    HPI Patient is in today for follow 5 days ago.  Past medical history significant for hypertension treated with carvedilol 3.125 mg daily, CAD s/p percutaneous coronary angioplasty, hypothyroidism, right eustachian tube dysfunction, stage IIIa chronic kidney disease, prediabetes.  She has had previous episodes of CVA, anemia, dehydration, syncope and collapse.  Early this past Friday morning, she got up out of bed to use the bathroom and fell while walking on the toilet.  She did not hit her head or sustain any injury.  She finished using the bathroom and went back to bed for a few hours until it was time to get up for the day.  She did check her blood pressure several times each day since that episode because she has been "feeling wobbly" since then.  She denies any dizziness, lightheadedness, vertigo, weakness, headache, changes to vision or hearing, chest pain, leg pain.  She did not trip on any particular object.  She denies any symptoms prior to falling.  ROS Negative unless otherwise noted in HPI    Objective:    BP 119/73   Pulse 78   Ht 5\' 1"  (1.549 m)   Wt 127 lb 1.9 oz (57.7 kg)   SpO2 98%   BMI 24.02 kg/m   Physical Exam Neck:     Vascular: No carotid bruit.  Cardiovascular:     Heart sounds: No murmur heard.    No friction rub. No gallop.  Pulmonary:     Breath sounds: No wheezing, rhonchi or rales.  Neurological:     Mental Status: She is oriented to person, place, and time.     GCS: GCS eye subscore is 4. GCS verbal subscore is 5. GCS motor subscore is 6.     Cranial Nerves: No cranial nerve deficit or facial asymmetry.     Motor: No weakness or tremor.     Deep Tendon Reflexes: Reflexes normal.      Assessment & Plan:  Fall, initial encounter -     CBC with Differential/Platelet;  Future -     Comprehensive metabolic panel; Future -     TSH; Future -     T4, free; Future -     MR BRAIN W WO CONTRAST; Future -     Ambulatory referral to Neurology  Balance problem -     CBC with Differential/Platelet; Future -     Comprehensive metabolic panel; Future -     TSH; Future -     T4, free; Future -     MR BRAIN W WO CONTRAST; Future -     Ambulatory referral to Neurology  Hyperlipidemia, unspecified hyperlipidemia type Assessment & Plan: Last lipid panel: LDL 153, HDL 61, triglycerides 136.  Patient states that she has not taken rosuvastatin in many years.  We discussed the importance of taking rosuvastatin and keeping cholesterol low to decrease her risk of any further strokes or other cardiovascular events.  Patient verbalized understanding and is agreeable to restarting rosuvastatin 5 mg daily.  Orders: -     Rosuvastatin Calcium; Take 1 tablet (5 mg total) by mouth daily.  Dispense: 90 tablet; Refill: 1  History of CVA (cerebrovascular accident)- 2017-   -     MR BRAIN W WO CONTRAST; Future -  Ambulatory referral to Neurology  Change in balance, fall Starting with CBC, CMP, and thyroid panel to assess for any lab abnormalities that may have contributed to her fall.  Patient does have a history of vasovagal events, and it is possible that her most recent fall was due to the same.  Given the patient's history of CVA/TIA and the fact that she has not been taking rosuvastatin for a long time, I do want to rule out any stroke so ordering imaging.  Return in about 1 day (around 05/06/2023) for blood work (not fasting).  Melida Quitter, PA

## 2023-05-06 ENCOUNTER — Other Ambulatory Visit: Payer: Medicare PPO

## 2023-05-06 DIAGNOSIS — R2689 Other abnormalities of gait and mobility: Secondary | ICD-10-CM

## 2023-05-06 DIAGNOSIS — W19XXXA Unspecified fall, initial encounter: Secondary | ICD-10-CM

## 2023-05-07 LAB — CBC WITH DIFFERENTIAL/PLATELET
Basophils Absolute: 0.1 10*3/uL (ref 0.0–0.2)
Basos: 2 %
EOS (ABSOLUTE): 1 10*3/uL — ABNORMAL HIGH (ref 0.0–0.4)
Eos: 14 %
Hematocrit: 41.3 % (ref 34.0–46.6)
Hemoglobin: 13.5 g/dL (ref 11.1–15.9)
Immature Grans (Abs): 0 10*3/uL (ref 0.0–0.1)
Immature Granulocytes: 0 %
Lymphocytes Absolute: 1.5 10*3/uL (ref 0.7–3.1)
Lymphs: 21 %
MCH: 28.9 pg (ref 26.6–33.0)
MCHC: 32.7 g/dL (ref 31.5–35.7)
MCV: 88 fL (ref 79–97)
Monocytes Absolute: 0.6 10*3/uL (ref 0.1–0.9)
Monocytes: 8 %
Neutrophils Absolute: 4.1 10*3/uL (ref 1.4–7.0)
Neutrophils: 55 %
Platelets: 443 10*3/uL (ref 150–450)
RBC: 4.67 x10E6/uL (ref 3.77–5.28)
RDW: 13.7 % (ref 11.7–15.4)
WBC: 7.4 10*3/uL (ref 3.4–10.8)

## 2023-05-07 LAB — COMPREHENSIVE METABOLIC PANEL
ALT: 22 IU/L (ref 0–32)
AST: 27 IU/L (ref 0–40)
Albumin/Globulin Ratio: 1.9 (ref 1.2–2.2)
Albumin: 4.5 g/dL (ref 3.7–4.7)
Alkaline Phosphatase: 60 IU/L (ref 44–121)
BUN/Creatinine Ratio: 14 (ref 12–28)
BUN: 16 mg/dL (ref 8–27)
Bilirubin Total: 0.5 mg/dL (ref 0.0–1.2)
CO2: 24 mmol/L (ref 20–29)
Calcium: 9.8 mg/dL (ref 8.7–10.3)
Chloride: 103 mmol/L (ref 96–106)
Creatinine, Ser: 1.12 mg/dL — ABNORMAL HIGH (ref 0.57–1.00)
Globulin, Total: 2.4 g/dL (ref 1.5–4.5)
Glucose: 105 mg/dL — ABNORMAL HIGH (ref 70–99)
Potassium: 4.6 mmol/L (ref 3.5–5.2)
Sodium: 143 mmol/L (ref 134–144)
Total Protein: 6.9 g/dL (ref 6.0–8.5)
eGFR: 48 mL/min/{1.73_m2} — ABNORMAL LOW (ref >59)

## 2023-05-07 LAB — T4, FREE: Free T4: 1.42 ng/dL (ref 0.82–1.77)

## 2023-05-07 LAB — TSH: TSH: 0.44 u[IU]/mL — ABNORMAL LOW (ref 0.450–4.500)

## 2023-08-05 ENCOUNTER — Ambulatory Visit: Payer: Medicare PPO | Admitting: Family Medicine

## 2023-08-05 ENCOUNTER — Encounter: Payer: Self-pay | Admitting: Family Medicine

## 2023-08-05 VITALS — BP 134/79 | HR 79 | Resp 18 | Ht 61.0 in | Wt 128.0 lb

## 2023-08-05 DIAGNOSIS — R296 Repeated falls: Secondary | ICD-10-CM | POA: Diagnosis not present

## 2023-08-05 DIAGNOSIS — E039 Hypothyroidism, unspecified: Secondary | ICD-10-CM | POA: Diagnosis not present

## 2023-08-05 NOTE — Progress Notes (Signed)
Established Patient Office Visit  Subjective   Patient ID: Kim Lane, female    DOB: 1934/01/18  Age: 89 y.o. MRN: [id number]  Chief Complaint  Patient presents with   Hypertension   Hypothyroidism   Fall    Balance not stable. Several falls w/ injury    HPI   Hypothyroid -patient is taking her thyroid medication.  No concerns.  Discussed repeating it and possibly decreasing it again if TSH is still low.  Falls-patient continues to have recurrent falls.  She even fell this morning on her way to the office.  Does not describe a syncopal episode.  States she tripped over the steps.  She feels "wobbly".  She states that for the past 2 to 3 weeks she has been urinating more frequently.  No other urinary symptoms.  She denies any confusion but does state that she has more trouble remembering things than previously.  She does not recall talking about seeing a neurologist or getting an MRI.  Patient declined to get x-rays of the knee and forearm.  States if it hurts worse she will let us know.   The ASCVD Risk score (Arnett DK, et al., 2019) failed to calculate for the following reasons:   The 2019 ASCVD risk score is only valid for ages 47 to 60   The patient has a prior MI or stroke diagnosis  Health Maintenance Due  Topic Date Due   Zoster Vaccines- Shingrix (1 of 2) 05/12/1953   Pneumonia Vaccine 25+ Years old (2 of 2 - PPSV23 or PCV20) 11/03/2016   COVID-19 Vaccine (3 - Pfizer risk series) 10/28/2022   INFLUENZA VACCINE  07/22/2023      Objective:     BP 134/79 (BP Location: Left Arm, Patient Position: Sitting, Cuff Size: Normal)   Pulse 79   Resp 18   Ht 5\' 1"  (1.549 m)   Wt 128 lb (58.1 kg)   SpO2 97%   BMI 24.19 kg/m    Physical Exam General: Alert, oriented times person place situation Skin: Abrasion on the right knee and posterior right forearm. MSK: Mildly tenderness to palpation of the right knee and right forearm.  No deformities or significant  swelling.   No results found for any visits on 08/05/23.      Assessment & Plan:   Hypothyroidism, unspecified type Assessment & Plan: Decreased tsh in may. Levothyroxine was decreased to in march.  Will recheck tsh and if still low can consider further decrease.    Orders: -     TSH  Recurrent falls Assessment & Plan: Patient continues to have frequent falls.  She lives alone.  Is independent at baseline.  Ambulated with a cane today.  Episodes that she is describing most recently do not appear to be be syncopal episodes although she has been diagnosed with syncope in the past.  Has had an MRI ordered but does not appear to be scheduled.  Had a referral placed to neurology but they called and she did not answer and they were unable to leave voicemail.  Due to the imbalance issues there is concern for previous stroke, mass, or normal pressure hydrocephalus (increased urination and worsening memory).  We will need to get the MRI to rule out these issues.  Will have our referral coordinator see if the MRI order needs to be resent.  Patient was given the number for [facility] and advised to call them to schedule an appointment.  Patient declined x-rays  of the knee and right forearm from her most recent fall.  Her abrasions were cleaned and dressed.  She is not on any anticoagulant.      Return in about 4 weeks (around 09/02/2023) for falls.    Sandre Kitty, MD

## 2023-08-05 NOTE — Patient Instructions (Addendum)
It was nice to see you today,  We addressed the following topics today: - I will recheck your thyroid level today and let you know if we need to change medication - call [facility] at [phone] and call them to schedule an appointment.  - I will look into scheduling an mri of your brain.   - please follow up with morgan in 1 month.    Have a great day,  Frederic Jericho, MD

## 2023-08-05 NOTE — Assessment & Plan Note (Signed)
Decreased tsh in may. Levothyroxine was decreased to in march.  Will recheck tsh and if still low can consider further decrease.

## 2023-08-05 NOTE — Assessment & Plan Note (Signed)
Patient continues to have frequent falls.  She lives alone.  Is independent at baseline.  Ambulated with a cane today.  Episodes that she is describing most recently do not appear to be be syncopal episodes although she has been diagnosed with syncope in the past.  Has had an MRI ordered but does not appear to be scheduled.  Had a referral placed to neurology but they called and she did not answer and they were unable to leave voicemail.  Due to the imbalance issues there is concern for previous stroke, mass, or normal pressure hydrocephalus (increased urination and worsening memory).  We will need to get the MRI to rule out these issues.  Will have our referral coordinator see if the MRI order needs to be resent.  Patient was given the number for [facility] and advised to call them to schedule an appointment.  Patient declined x-rays of the knee and right forearm from her most recent fall.  Her abrasions were cleaned and dressed.  She is not on any anticoagulant.

## 2023-08-06 LAB — TSH: TSH: 0.248 u[IU]/mL — ABNORMAL LOW (ref 0.450–4.500)

## 2023-08-12 ENCOUNTER — Telehealth: Payer: Self-pay

## 2023-08-12 DIAGNOSIS — E039 Hypothyroidism, unspecified: Secondary | ICD-10-CM

## 2023-08-12 MED ORDER — LEVOTHYROXINE SODIUM 88 MCG PO TABS
88.0000 ug | ORAL_TABLET | Freq: Every day | ORAL | 1 refills | Status: DC
Start: 2023-08-12 — End: 2024-03-08

## 2023-08-12 NOTE — Telephone Encounter (Signed)
Pt is calling to get the refill for for levothyroxine (SYNTHROID)  per Dr. Constance Goltz lab note

## 2023-08-12 NOTE — Telephone Encounter (Signed)
Meds ordered this encounter  Medications   levothyroxine (SYNTHROID) 88 MCG tablet    Sig: Take 1 tablet (88 mcg total) by mouth daily.    Dispense:  90 tablet    Refill:  1    Order Specific Question:   Supervising Provider    Answer:   Nani Gasser D [[id number]]

## 2023-08-24 ENCOUNTER — Encounter: Payer: Self-pay | Admitting: Family Medicine

## 2023-08-27 ENCOUNTER — Ambulatory Visit
Admission: RE | Admit: 2023-08-27 | Discharge: 2023-08-27 | Disposition: A | Discharge status: Home / Self Care | Payer: Medicare PPO | Source: Ambulatory Visit | Attending: Family Medicine | Admitting: Family Medicine

## 2023-08-27 DIAGNOSIS — W19XXXA Unspecified fall, initial encounter: Secondary | ICD-10-CM

## 2023-08-27 DIAGNOSIS — Z8673 Personal history of transient ischemic attack (TIA), and cerebral infarction without residual deficits: Secondary | ICD-10-CM

## 2023-08-27 DIAGNOSIS — R2689 Other abnormalities of gait and mobility: Secondary | ICD-10-CM

## 2023-08-27 MED ORDER — GADOPICLENOL 0.5 MMOL/ML IV SOLN
6.0000 mL | Freq: Once | INTRAVENOUS | Status: AC | PRN
  Administered 2023-08-27: 6 mL via INTRAVENOUS

## 2023-09-02 ENCOUNTER — Encounter: Payer: Self-pay | Admitting: Family Medicine

## 2023-09-02 ENCOUNTER — Ambulatory Visit: Payer: Medicare PPO | Admitting: Family Medicine

## 2023-09-02 VITALS — BP 135/79 | HR 71 | Resp 18 | Ht 61.0 in | Wt 125.0 lb

## 2023-09-02 DIAGNOSIS — R7303 Prediabetes: Secondary | ICD-10-CM

## 2023-09-02 DIAGNOSIS — R296 Repeated falls: Secondary | ICD-10-CM | POA: Diagnosis not present

## 2023-09-02 DIAGNOSIS — E559 Vitamin D deficiency, unspecified: Secondary | ICD-10-CM

## 2023-09-02 DIAGNOSIS — I10 Essential (primary) hypertension: Secondary | ICD-10-CM | POA: Diagnosis not present

## 2023-09-02 DIAGNOSIS — E785 Hyperlipidemia, unspecified: Secondary | ICD-10-CM

## 2023-09-02 DIAGNOSIS — E039 Hypothyroidism, unspecified: Secondary | ICD-10-CM | POA: Diagnosis not present

## 2023-09-02 DIAGNOSIS — R058 Other specified cough: Secondary | ICD-10-CM | POA: Diagnosis not present

## 2023-09-02 DIAGNOSIS — R062 Wheezing: Secondary | ICD-10-CM

## 2023-09-02 NOTE — Assessment & Plan Note (Addendum)
Referral to neurology has been authorized, she needs to call them to schedule an appointment for further evaluation.  Phone number provided again today.  Patient did not recall discussing neurology referral previously, did not recall receiving phone number at her visit with Dr. Constance Goltz.  I emphasized the importance of her calling neurology immediately to be evaluated as we have completed the extent of workup that we are capable of in primary care.

## 2023-09-02 NOTE — Assessment & Plan Note (Signed)
Rechecking TSH and T4 today after decreasing levothyroxine to 88 mcg daily.

## 2023-09-02 NOTE — Progress Notes (Signed)
Established Patient Office Visit  Subjective   Patient ID: Kim Lane, female    DOB: [birth year]  Age: 89 y.o. MRN: [id number]  Chief Complaint  Patient presents with   Fall    HPI Kim Lane is a 89 y.o. female presenting today for follow up of recurrent falls.  Initially presented for evaluation of recurrent falls on 05/05/2023. Past medical history significant for hypertension treated with carvedilol 3.125 mg daily, CAD s/p percutaneous coronary angioplasty, hypothyroidism, right eustachian tube dysfunction, stage IIIa chronic kidney disease, prediabetes. She has had previous episodes of CVA, anemia, dehydration, syncope and collapse.  She followed up on 08/05/2023 with Dr. Constance Goltz, at that time still endorsed "feeling wobbly".  CBC, CMP within normal limits.  TSH was low, so levothyroxine dose was adjusted by Dr. Constance Goltz.  At that visit, she was given the phone number for [facility] and advised to call them to schedule an appointment as they called with no answer and were unable to leave a voicemail.  Brain MRI completed 08/27/2023, without acute intracranial abnormalities.  Chronic small vessel ischemic changes, small chronic infarct within the right cerebellar hemisphere, chronic microhemorrhage in the left cerebellar hemisphere all unchanged from MRI 09/22/2022.  Generalized cerebral atrophy. No evidence of mass, normal pressure hydrocephalus.  She is also complaining of and acute productive cough that started about 4 days ago.  Mucus is typically a yellow or green color, she states that she can sometimes hear her breathing.  She denies chest pain, headache, dizziness, shortness of breath, palpitations, fever, chills, body aches.  Outpatient Medications Prior to Visit  Medication Sig   aspirin 81 MG tablet Take 81 mg by mouth every other day.   carvedilol (COREG) 3.125 MG tablet TAKE 1 TABLET BY MOUTH EVERY OTHER MORNING   Cholecalciferol (HM VITAMIN D3) 100 MCG (4000  UT) CAPS 4,000 IU QD   diclofenac Sodium (VOLTAREN) 1 % GEL Apply 2 g topically 4 (four) times daily. To affected joint for pain prn   levothyroxine (SYNTHROID) 88 MCG tablet Take 1 tablet (88 mcg total) by mouth daily.   Melatonin 2.5 MG CAPS Take 1 capsule by mouth at bedtime.   meloxicam (MOBIC) 15 MG tablet Take 1 tablet (15 mg total) by mouth daily as needed for pain.   memantine (NAMENDA) 10 MG tablet Take 1 tablet (10 mg total) by mouth 2 (two) times daily.   Multiple Vitamins-Minerals (CENTRUM SILVER PO) Take 1 tablet by mouth daily.   nitroGLYCERIN (NITROSTAT) 0.4 MG SL tablet Place 1 tablet (0.4 mg total) under the tongue every 5 (five) minutes as needed for chest pain.   Omega-3 Fatty Acids (FISH OIL) 1000 MG CAPS Take 1 capsule by mouth daily.   PROAIR HFA 108 (90 Base) MCG/ACT inhaler Inhale 2 puffs into the lungs every 4 (four) hours as needed for shortness of breath.   RESTASIS 0.05 % ophthalmic emulsion Place 2 drops into both eyes 2 (two) times daily. For dry eyes   rosuvastatin (CRESTOR) 5 MG tablet Take 1 tablet (5 mg total) by mouth daily.   No facility-administered medications prior to visit.    ROS Negative unless otherwise noted in HPI   Objective:     BP 135/79 (BP Location: Left Arm, Patient Position: Sitting, Cuff Size: Normal)   Pulse 71   Resp 18   Ht 5\' 1"  (1.549 m)   Wt 125 lb (56.7 kg)   SpO2 97%   BMI 23.62 kg/m  Physical Exam Constitutional:      General: She is not in acute distress.    Appearance: Normal appearance.  HENT:     Head: Normocephalic and atraumatic.  Cardiovascular:     Rate and Rhythm: Normal rate and regular rhythm.     Heart sounds: No murmur heard.    No friction rub. No gallop.  Pulmonary:     Effort: Pulmonary effort is normal. No respiratory distress.     Breath sounds: Wheezing (Expiratory, bilaterally throughout lungs) present. No rhonchi or rales.  Skin:    General: Skin is warm and dry.  Neurological:      Mental Status: She is alert and oriented to person, place, and time.    MRI Brain IMPRESSION: 1.  No evidence of an acute intracranial abnormality. 2. Chronic small vessel ischemic changes which are moderate-to-severe in the cerebral white matter, and minimal in the pons, similar to the prior brain MRI of 09/22/2022. 3. Unchanged small chronic infarct within the right cerebellar hemisphere. 4. Unchanged chronic microhemorrhage within the left cerebellar hemisphere. 5. Generalized cerebral atrophy, mild for age.   Assessment & Plan:  Recurrent falls Assessment & Plan: Referral to neurology has been authorized, she needs to call them to schedule an appointment for further evaluation.  Phone number provided again today.  Patient did not recall discussing neurology referral previously, did not recall receiving phone number at her visit with Dr. Constance Goltz.  I emphasized the importance of her calling neurology immediately to be evaluated as we have completed the extent of workup that we are capable of in primary care.   Hypothyroidism, unspecified type Assessment & Plan: Rechecking TSH and T4 today after decreasing levothyroxine to 88 mcg daily.  Orders: -     T4, free; Future -     TSH; Future  Productive cough -     DG Chest 2 View; Future  Wheezing -     DG Chest 2 View; Future  Productive cough, Wheezing Order chest x-ray to evaluate for possible pneumonia.  Patient is not up-to-date on pneumonia vaccines, has not received PCV 20.  Return in about 2 months (around 11/02/2023) for follow-up for HTN, HLD, prediabetes, thyroid, fasting blood work 1 week before.    Melida Quitter, PA

## 2023-09-02 NOTE — Patient Instructions (Addendum)
CALL [facility] TO SCHEDULE: [phone]  Go to [facility] today for your chest x-ray: [address] [place], [place], [place] [address]

## 2023-09-03 ENCOUNTER — Telehealth: Payer: Self-pay

## 2023-09-03 ENCOUNTER — Ambulatory Visit
Admission: RE | Admit: 2023-09-03 | Discharge: 2023-09-03 | Disposition: A | Discharge status: Home / Self Care | Payer: Medicare PPO | Source: Ambulatory Visit | Attending: Family Medicine | Admitting: Family Medicine

## 2023-09-03 ENCOUNTER — Encounter: Payer: Self-pay | Admitting: Family Medicine

## 2023-09-03 DIAGNOSIS — R058 Other specified cough: Secondary | ICD-10-CM

## 2023-09-03 DIAGNOSIS — R062 Wheezing: Secondary | ICD-10-CM

## 2023-09-03 LAB — TSH: TSH: 0.754 u[IU]/mL (ref 0.450–4.500)

## 2023-09-03 LAB — T4, FREE: Free T4: 1.48 ng/dL (ref 0.82–1.77)

## 2023-09-03 NOTE — Telephone Encounter (Signed)
Attempted to contact both at work number and cell phone with no answer.  I left a voicemail on her cell phone letting her know that I will not be here for the afternoon.  If she does call back, you can let her know that we are ordering a chest x-ray because her mom was complaining of an acute cough where she was coughing up some yellow or green mucus.  I also did hear wheezing on physical exam, so I would like a chest x-ray to look for any type of infection.  I am also very glad that she is getting involved because there have now been 3 attempts by both myself and Dr. Constance Goltz to get her in touch with neurology in order to be further evaluated for her frequent falls.  All of the lab work and the MRI that we have already ordered have not offered any explanation, so she will need to see neurology for further workup as we have completed everything that we are able to do in the primary care setting.  She was given the number for [facility] to contact them to schedule an appointment.  They have already received word in my referral as to why she is needing an evaluation.

## 2023-09-03 NOTE — Telephone Encounter (Signed)
Pt daughter is calling wanting to speak with provider to get an overall of the pt appointment and go over why she is have the xray ect.  Bjorn Loser is her daughter name  Number are   [phone]  [phone] Work  She is requesting that a call to work be made if she doesn't answer cell phone.

## 2023-09-09 ENCOUNTER — Telehealth: Payer: Self-pay | Admitting: *Deleted

## 2023-09-09 DIAGNOSIS — R0602 Shortness of breath: Secondary | ICD-10-CM

## 2023-09-09 MED ORDER — ALBUTEROL SULFATE HFA 108 (90 BASE) MCG/ACT IN AERS: 2.0000 | INHALATION_SPRAY | RESPIRATORY_TRACT | 1 refills | Status: AC | PRN

## 2023-09-09 NOTE — Telephone Encounter (Signed)
We still have not received x-ray results.  I have not been able to get a hold of anybody at [facility] to inquire about the status of the images and the report.  I will send in an albuterol inhaler to help with her breathing while we await the results, but if it continues to worsen I would recommend going to the emergency room for evaluation.

## 2023-09-09 NOTE — Telephone Encounter (Signed)
Tried to contact pt to inform her of below and could not LVM due to mail box being full.

## 2023-09-09 NOTE — Telephone Encounter (Signed)
Pt returned call and I informed her of below.

## 2023-09-09 NOTE — Telephone Encounter (Signed)
Pt calling to check status of xray, she said her breathing is still not what it should be. Please advise.

## 2023-10-28 ENCOUNTER — Other Ambulatory Visit: Payer: Medicare PPO

## 2023-10-28 DIAGNOSIS — E559 Vitamin D deficiency, unspecified: Secondary | ICD-10-CM | POA: Diagnosis not present

## 2023-10-28 DIAGNOSIS — E785 Hyperlipidemia, unspecified: Secondary | ICD-10-CM | POA: Diagnosis not present

## 2023-10-28 DIAGNOSIS — R7303 Prediabetes: Secondary | ICD-10-CM

## 2023-10-28 DIAGNOSIS — I10 Essential (primary) hypertension: Secondary | ICD-10-CM

## 2023-10-28 DIAGNOSIS — E039 Hypothyroidism, unspecified: Secondary | ICD-10-CM

## 2023-10-29 LAB — CBC WITH DIFFERENTIAL/PLATELET
Basophils Absolute: 0.1 10*3/uL (ref 0.0–0.2)
Basos: 2 %
EOS (ABSOLUTE): 1 10*3/uL — ABNORMAL HIGH (ref 0.0–0.4)
Eos: 16 %
Hematocrit: 41.6 % (ref 34.0–46.6)
Hemoglobin: 13.3 g/dL (ref 11.1–15.9)
Immature Grans (Abs): 0 10*3/uL (ref 0.0–0.1)
Immature Granulocytes: 1 %
Lymphocytes Absolute: 1.2 10*3/uL (ref 0.7–3.1)
Lymphs: 20 %
MCH: 28.9 pg (ref 26.6–33.0)
MCHC: 32 g/dL (ref 31.5–35.7)
MCV: 90 fL (ref 79–97)
Monocytes Absolute: 0.5 10*3/uL (ref 0.1–0.9)
Monocytes: 8 %
Neutrophils Absolute: 3.4 10*3/uL (ref 1.4–7.0)
Neutrophils: 53 %
Platelets: 438 10*3/uL (ref 150–450)
RBC: 4.6 x10E6/uL (ref 3.77–5.28)
RDW: 12.9 % (ref 11.7–15.4)
WBC: 6.3 10*3/uL (ref 3.4–10.8)

## 2023-10-29 LAB — COMPREHENSIVE METABOLIC PANEL
ALT: 18 [IU]/L (ref 0–32)
AST: 23 [IU]/L (ref 0–40)
Albumin: 4.1 g/dL (ref 3.7–4.7)
Alkaline Phosphatase: 65 [IU]/L (ref 44–121)
BUN/Creatinine Ratio: 12 (ref 12–28)
BUN: 13 mg/dL (ref 8–27)
Bilirubin Total: 0.3 mg/dL (ref 0.0–1.2)
CO2: 24 mmol/L (ref 20–29)
Calcium: 9.5 mg/dL (ref 8.7–10.3)
Chloride: 102 mmol/L (ref 96–106)
Creatinine, Ser: 1.09 mg/dL — ABNORMAL HIGH (ref 0.57–1.00)
Globulin, Total: 2.9 g/dL (ref 1.5–4.5)
Glucose: 99 mg/dL (ref 70–99)
Potassium: 4.7 mmol/L (ref 3.5–5.2)
Sodium: 142 mmol/L (ref 134–144)
Total Protein: 7 g/dL (ref 6.0–8.5)
eGFR: 49 mL/min/{1.73_m2} — ABNORMAL LOW (ref >59)

## 2023-10-29 LAB — LIPID PANEL
Chol/HDL Ratio: 4 ratio (ref 0.0–4.4)
Cholesterol, Total: 243 mg/dL — ABNORMAL HIGH (ref 100–199)
HDL: 61 mg/dL (ref >39)
LDL Chol Calc (NIH): 164 mg/dL — ABNORMAL HIGH (ref 0–99)
Triglycerides: 101 mg/dL (ref 0–149)
VLDL Cholesterol Cal: 18 mg/dL (ref 5–40)

## 2023-10-29 LAB — TSH RFX ON ABNORMAL TO FREE T4: TSH: 2.4 u[IU]/mL (ref 0.450–4.500)

## 2023-10-29 LAB — HEMOGLOBIN A1C
Est. average glucose Bld gHb Est-mCnc: 128 mg/dL
Hgb A1c MFr Bld: 6.1 % — ABNORMAL HIGH (ref 4.8–5.6)

## 2023-10-29 LAB — VITAMIN D 25 HYDROXY (VIT D DEFICIENCY, FRACTURES): Vit D, 25-Hydroxy: 46.8 ng/mL (ref 30.0–100.0)

## 2023-11-01 ENCOUNTER — Institutional Professional Consult (permissible substitution): Payer: Medicare PPO | Admitting: Diagnostic Neuroimaging

## 2023-11-04 ENCOUNTER — Encounter: Payer: Self-pay | Admitting: Family Medicine

## 2023-11-04 ENCOUNTER — Ambulatory Visit: Payer: Medicare PPO | Admitting: Family Medicine

## 2023-11-04 VITALS — BP 137/72 | HR 72 | Resp 18 | Ht 61.0 in | Wt 125.0 lb

## 2023-11-04 DIAGNOSIS — E559 Vitamin D deficiency, unspecified: Secondary | ICD-10-CM | POA: Diagnosis not present

## 2023-11-04 DIAGNOSIS — N1831 Chronic kidney disease, stage 3a: Secondary | ICD-10-CM

## 2023-11-04 DIAGNOSIS — I10 Essential (primary) hypertension: Secondary | ICD-10-CM

## 2023-11-04 DIAGNOSIS — E039 Hypothyroidism, unspecified: Secondary | ICD-10-CM | POA: Diagnosis not present

## 2023-11-04 DIAGNOSIS — R7303 Prediabetes: Secondary | ICD-10-CM | POA: Diagnosis not present

## 2023-11-04 DIAGNOSIS — E785 Hyperlipidemia, unspecified: Secondary | ICD-10-CM | POA: Diagnosis not present

## 2023-11-04 DIAGNOSIS — Z23 Encounter for immunization: Secondary | ICD-10-CM | POA: Diagnosis not present

## 2023-11-04 MED ORDER — ROSUVASTATIN CALCIUM 5 MG PO TABS: 5.0000 mg | ORAL_TABLET | Freq: Every day | ORAL | 1 refills | Status: DC

## 2023-11-04 MED ORDER — CARVEDILOL 3.125 MG PO TABS: ORAL_TABLET | ORAL | 1 refills | Status: AC

## 2023-11-04 NOTE — Progress Notes (Signed)
Established Patient Office Visit  Subjective   Patient ID: Kim Lane, female    DOB: [birth year]  Age: 89 y.o. MRN: [id number]  Chief Complaint  Patient presents with   Hyperlipidemia   Hypertension   Prediabetes    HPI Kim Lane is a 89 y.o. female presenting today for follow up of hypertension, hyperlipidemia, prediabetes, hypothyroidism.  She notes that she is aware that she has been eating too many sweets over the last few months and is not surprised that her A1c and cholesterol levels increased. Hypertension:  Pt denies chest pain, SOB, dizziness, edema, syncope, fatigue or heart palpitations. Taking carvedilol, reports excellent compliance with treatment. Denies side effects. Hyperlipidemia: tolerating rosuvastatin well with no myalgias or significant side effects.  Prediabetes: denies hypoglycemic events, wounds or sores that are not healing well, increased thirst or urination.  Hypothyroidism: Taking levothyroxine 88 mcg daily regularly in the AM away from food and vitamins. Denies fatigue, weight changes, heat/cold intolerance, skin/hair changes, bowel changes, CVS symptoms.   Outpatient Medications Prior to Visit  Medication Sig   albuterol (PROAIR HFA) 108 (90 Base) MCG/ACT inhaler Inhale 2 puffs into the lungs every 4 (four) hours as needed for shortness of breath.   aspirin 81 MG tablet Take 81 mg by mouth every other day.   Cholecalciferol (HM VITAMIN D3) 100 MCG (4000 UT) CAPS 4,000 IU QD   diclofenac Sodium (VOLTAREN) 1 % GEL Apply 2 g topically 4 (four) times daily. To affected joint for pain prn   levothyroxine (SYNTHROID) 88 MCG tablet Take 1 tablet (88 mcg total) by mouth daily.   Melatonin 2.5 MG CAPS Take 1 capsule by mouth at bedtime.   meloxicam (MOBIC) 15 MG tablet Take 1 tablet (15 mg total) by mouth daily as needed for pain.   memantine (NAMENDA) 10 MG tablet Take 1 tablet (10 mg total) by mouth 2 (two) times daily.   Multiple Vitamins-Minerals  (CENTRUM SILVER PO) Take 1 tablet by mouth daily.   nitroGLYCERIN (NITROSTAT) 0.4 MG SL tablet Place 1 tablet (0.4 mg total) under the tongue every 5 (five) minutes as needed for chest pain.   Omega-3 Fatty Acids (FISH OIL) 1000 MG CAPS Take 1 capsule by mouth daily.   RESTASIS 0.05 % ophthalmic emulsion Place 2 drops into both eyes 2 (two) times daily. For dry eyes   [DISCONTINUED] carvedilol (COREG) 3.125 MG tablet TAKE 1 TABLET BY MOUTH EVERY OTHER MORNING   [DISCONTINUED] rosuvastatin (CRESTOR) 5 MG tablet Take 1 tablet (5 mg total) by mouth daily.   No facility-administered medications prior to visit.    ROS Negative unless otherwise noted in HPI   Objective:     BP 137/72 (BP Location: Left Arm, Patient Position: Sitting, Cuff Size: Normal)   Pulse 72   Resp 18   Ht 5\' 1"  (1.549 m)   Wt 125 lb (56.7 kg)   SpO2 98%   BMI 23.62 kg/m   Physical Exam Constitutional:      General: She is not in acute distress.    Appearance: Normal appearance.  HENT:     Head: Normocephalic and atraumatic.  Cardiovascular:     Rate and Rhythm: Normal rate and regular rhythm.     Heart sounds: No murmur heard.    No friction rub. No gallop.  Pulmonary:     Effort: Pulmonary effort is normal. No respiratory distress.     Breath sounds: No wheezing, rhonchi or rales.  Skin:  General: Skin is warm and dry.  Neurological:     Mental Status: She is alert and oriented to person, place, and time.     Assessment & Plan:  Essential hypertension, benign Assessment & Plan: BP goal <140/90.  Stable, at goal.  Continue carvedilol 3.125 mg daily.  Will continue to monitor.  Orders: -     Carvedilol; TAKE 1 TABLET BY MOUTH EVERY OTHER MORNING  Dispense: 90 tablet; Refill: 1  Hyperlipidemia, unspecified hyperlipidemia type Assessment & Plan: Last lipid panel: LDL 164, HDL 61, triglycerides 101.  Recommend decreasing intake of foods high in sugar and fat.  Continue rosuvastatin 5 mg daily.  If  cholesterol remains elevated on recheck, intensify rosuvastatin to 10 mg daily.  Will continue to monitor.  Orders: -     Rosuvastatin Calcium; Take 1 tablet (5 mg total) by mouth daily.  Dispense: 90 tablet; Refill: 1  Prediabetes Assessment & Plan: A1c increased to 6.1.  Encouraged to decrease intake of sugar and carbohydrates.   Hypothyroidism, unspecified type Assessment & Plan: TSH within normal limits.  Continue levothyroxine 88 mcg daily.  Will continue to monitor.   Need for influenza vaccination -     Flu Vaccine Trivalent High Dose (Fluad)  Stage 3a chronic kidney disease (HCC) Assessment & Plan: Creatinine fairly stable 1.09, GFR stable 49.     Return in about 4 months (around 03/03/2024) for follow-up for HTN, HLD, preDM, thyroid, fasting blood work 1 week before.    Melida Quitter, PA

## 2023-11-04 NOTE — Patient Instructions (Signed)
Cut back on sweets and foods high in trans and saturated fats, and I am confident we will see your cholesterol and blood sugar go down when we recheck in a few months!

## 2023-11-04 NOTE — Assessment & Plan Note (Signed)
A1c increased to 6.1.  Encouraged to decrease intake of sugar and carbohydrates.

## 2023-11-04 NOTE — Assessment & Plan Note (Signed)
Creatinine fairly stable 1.09, GFR stable 49.

## 2023-11-04 NOTE — Assessment & Plan Note (Signed)
BP goal <140/90.  Stable, at goal.  Continue carvedilol 3.125 mg daily.  Will continue to monitor.

## 2023-11-04 NOTE — Assessment & Plan Note (Signed)
Last lipid panel: LDL 164, HDL 61, triglycerides 101.  Recommend decreasing intake of foods high in sugar and fat.  Continue rosuvastatin 5 mg daily.  If cholesterol remains elevated on recheck, intensify rosuvastatin to 10 mg daily.  Will continue to monitor.

## 2023-11-04 NOTE — Assessment & Plan Note (Signed)
TSH within normal limits.  Continue levothyroxine 88 mcg daily.  Will continue to monitor.

## 2023-11-08 ENCOUNTER — Encounter: Payer: Self-pay | Admitting: Diagnostic Neuroimaging

## 2023-11-08 ENCOUNTER — Ambulatory Visit: Payer: Medicare PPO | Admitting: Diagnostic Neuroimaging

## 2023-11-08 VITALS — BP 126/68 | HR 64 | Ht 61.0 in | Wt 125.0 lb

## 2023-11-08 DIAGNOSIS — R269 Unspecified abnormalities of gait and mobility: Secondary | ICD-10-CM | POA: Diagnosis not present

## 2023-11-08 NOTE — Progress Notes (Signed)
[facility]  PATIENT: Kim Lane DOB: 12/26/1933  REFERRING CLINICIAN: Melida Quitter, PA  HISTORY FROM: patient, daughter REASON FOR VISIT: follow up   HISTORICAL  CHIEF COMPLAINT:  Chief Complaint  Patient presents with   New Patient (Initial Visit)    Rm 6,  Pt is referred by Saralyn Pilar PA for balance problem, hx of TIA.     HISTORY OF PRESENT ILLNESS:   UPDATE (11/08/23, VRP): Since last visit, continues with memory loss issues. Also with gait issues, and falls (1-2 x per month). Memory loss continues. Still living alone and driving short distances. Got lost the other day when trying to meet her daughter. Son passed away earlier this year, and that has been stressful.   UPDATE (09/03/22, VRP): Since last visit, doing well until last 1 year. More short term memory loss, diff with driving directions, finances, distracted. Some changes with ADLs as a result. Poor sleep and more depression. Living alone. More hearing and vision loss.  UPDATE (08/29/19, VRP): Since last visit, in June 2020, has syncope attack at home (went to bathroom, on commode, then nausea, then syncope and hit head). Patient lives alone. Woke up and then able to take care of herself rest of the day.     UPDATE 12/02/16: Since last visit, stable except for weight loss and anemia. No new neuro symptoms. Some issues with fatigue, depression, getting confused with driving.    UPDATE 05/21/16: Since last visit, symptoms are improved. No new symptoms. MRI, MRA results reviewed. BP is improved. Balance is improved.    PRIOR HPI (04/20/16): 89 year old right-handed female here for evaluation of slurred speech and trouble talking. 04/06/16 patient had his new onset slurred speech and balance difficulty. 04/10/16 she had significant balance difficulty and elevated blood pressure of 180/120. Patient has been having trouble with word recall. Patient also having some trouble with swallowing as well as  bilateral leg weakness. Patient feeling some additional problems in her left face and left foot. Patient had not been taking aspirin on a regular basis prior to this event. Patient went to PCP for evaluation and then referred to me for neurology consultation.   REVIEW OF SYSTEMS: Full 14 system review of systems performed and negative with exception of: as per HPI.  ALLERGIES: Allergies  Allergen Reactions   Fosamax [Alendronate Sodium] Other (See Comments)    "made me ache all over"    HOME MEDICATIONS: Outpatient Medications Prior to Visit  Medication Sig Dispense Refill   albuterol (PROAIR HFA) 108 (90 Base) MCG/ACT inhaler Inhale 2 puffs into the lungs every 4 (four) hours as needed for shortness of breath. 18 g 1   aspirin 81 MG tablet Take 81 mg by mouth every other day.     carvedilol (COREG) 3.125 MG tablet TAKE 1 TABLET BY MOUTH EVERY OTHER MORNING 90 tablet 1   Cholecalciferol (HM VITAMIN D3) 100 MCG (4000 UT) CAPS 4,000 IU QD 30 capsule    diclofenac Sodium (VOLTAREN) 1 % GEL Apply 2 g topically 4 (four) times daily. To affected joint for pain prn 100 g 0   levothyroxine (SYNTHROID) 88 MCG tablet Take 1 tablet (88 mcg total) by mouth daily. 90 tablet 1   Melatonin 2.5 MG CAPS Take 1 capsule by mouth at bedtime.     meloxicam (MOBIC) 15 MG tablet Take 1 tablet (15 mg total) by mouth daily as needed for pain. 30 tablet 1   memantine (NAMENDA) 10 MG tablet Take  1 tablet (10 mg total) by mouth 2 (two) times daily. 60 tablet 12   Multiple Vitamins-Minerals (CENTRUM SILVER PO) Take 1 tablet by mouth daily.     nitroGLYCERIN (NITROSTAT) 0.4 MG SL tablet Place 1 tablet (0.4 mg total) under the tongue every 5 (five) minutes as needed for chest pain. 30 tablet 0   Omega-3 Fatty Acids (FISH OIL) 1000 MG CAPS Take 1 capsule by mouth daily.     RESTASIS 0.05 % ophthalmic emulsion Place 2 drops into both eyes 2 (two) times daily. For dry eyes     rosuvastatin (CRESTOR) 5 MG tablet Take 1  tablet (5 mg total) by mouth daily. 90 tablet 1   No facility-administered medications prior to visit.    PAST MEDICAL HISTORY: Past Medical History:  Diagnosis Date   Anemia    Anginal pain (HCC)    occ   Arthritis    Blood dyscrasia 12/2016   hodgins lymphoma tx-remission   CAD (coronary artery disease) 02/2006   Taxus stent placed to LAD and Diagonal per Dr. Juanda Chance   Cataracts, bilateral    Complication of anesthesia    HTN (hypertension)    Hypercholesterolemia    Hypothyroidism    IBS (irritable bowel syndrome)    Osteoarthritis    PONV (postoperative nausea and vomiting)    Stroke (HCC) 03/2016    PAST SURGICAL HISTORY: Past Surgical History:  Procedure Laterality Date   APPENDECTOMY     IR GENERIC HISTORICAL  01/25/2017   IR US GUIDE VASC ACCESS RIGHT 01/25/2017 Irish Lack, MD [facility]   IR GENERIC HISTORICAL  01/25/2017   IR FLUORO GUIDE PORT INSERTION RIGHT 01/25/2017 Irish Lack, MD [facility]   IR REMOVAL TUN ACCESS W/ PORT W/O FL MOD SED  10/28/2017   KYPHOPLASTY N/A 06/24/2017   Procedure: KYPHOPLASTY T10;  Surgeon: Venita Lick, MD;  Location: [facility] OR;  Service: Orthopedics;  Laterality: N/A;  90 mins   LEFT HEART CATH AND CORONARY ANGIOGRAPHY N/A 06/16/2017   Procedure: Left Heart Cath and Coronary Angiography;  Surgeon: Tonny Bollman, MD;  Location: [facility];  Service: Cardiovascular;  Laterality: N/A;   MASS EXCISION Right 12/22/2016   Procedure: RIGHT NECK LYMPH NODE BIOPSY;  Surgeon: Drema Halon, MD;  Location: [facility];  Service: ENT;  Laterality: Right;   SPINE SURGERY     stents     in heart   TONSILLECTOMY     TOTAL KNEE ARTHROPLASTY Bilateral 2011,2012   VAGINAL HYSTERECTOMY      FAMILY HISTORY: Family History  Problem Relation Age of Onset   Diabetes Mother    Dementia Mother    ALS Mother    Heart Problems Father    Liver disease Sister    Cirrhosis Sister    Diabetes Sister    Dementia Sister     Other Brother        GOOD HEALTH   Other Daughter        GOOD HEALTH   Bone cancer Son    Heart block Son        CABG   Heart block Other        CABG   Breast cancer Neg Hx     SOCIAL HISTORY: Social History   Socioeconomic History   Marital status: Widowed    Spouse name: Not on file   Number of children: 2   Years of education: 77   Highest education level: Not on file  Occupational History    Employer: Barista, retired  Tobacco Use   Smoking status: Former    Passive exposure: Never   Smokeless tobacco: Never   Tobacco comments:    quit smoking 30 years ago, very light smoker  Vaping Use   Vaping status: Never Used  Substance and Sexual Activity   Alcohol use: No    Alcohol/week: 0.0 standard drinks of alcohol   Drug use: No   Sexual activity: Not on file  Other Topics Concern   Not on file  Social History Narrative   08/29/19 Lives alone   caffeine use- coffee- 5 cups daily   Social Determinants of Health   Financial Resource Strain: Low Risk  (02/01/2023)   Overall Financial Resource Strain (CARDIA)    Difficulty of Paying Living Expenses: Not hard at all  Food Insecurity: Unknown (02/01/2023)   Hunger Vital Sign    Worried About Running Out of Food in the Last Year: Not on file    Ran Out of Food in the Last Year: Never true  Transportation Needs: No Transportation Needs (02/01/2023)   PRAPARE - Administrator, Civil Service (Medical): No    Lack of Transportation (Non-Medical): No  Physical Activity: Inactive (02/01/2023)   Exercise Vital Sign    Days of Exercise per Week: 1 day    Minutes of Exercise per Session: 0 min  Stress: No Stress Concern Present (02/01/2023)   [employer] of Occupational Health - Occupational Stress Questionnaire    Feeling of Stress : Not at all  Social Connections: Moderately Integrated (02/01/2023)   Social Connection and Isolation Panel [NHANES]    Frequency of  Communication with Friends and Family: More than three times a week    Frequency of Social Gatherings with Friends and Family: More than three times a week    Attends Religious Services: More than 4 times per year    Active Member of [employer] or Organizations: Yes    Attends Banker Meetings: More than 4 times per year    Marital Status: Widowed  Intimate Partner Violence: Not At Risk (02/01/2023)   Humiliation, Afraid, Rape, and Kick questionnaire    Fear of Current or Ex-Partner: No    Emotionally Abused: No    Physically Abused: No    Sexually Abused: No     PHYSICAL EXAM  GENERAL EXAM/CONSTITUTIONAL: Vitals:  Vitals:   11/08/23 1013  BP: 126/68  Pulse: 64  Weight: 125 lb (56.7 kg)  Height: 5\' 1"  (1.549 m)   Body mass index is 23.62 kg/m. Wt Readings from Last 3 Encounters:  11/08/23 125 lb (56.7 kg)  11/04/23 125 lb (56.7 kg)  09/02/23 125 lb (56.7 kg)   Patient is in no distress; well developed, nourished and groomed; neck is supple  CARDIOVASCULAR: Examination of carotid arteries is normal; no carotid bruits Regular rate and rhythm, no murmurs Examination of peripheral vascular system by observation and palpation is normal  EYES: Ophthalmoscopic exam of optic discs and posterior segments is normal; no papilledema or hemorrhages No results found.  MUSCULOSKELETAL: Gait, strength, tone, movements noted in Neurologic exam below  NEUROLOGIC: MENTAL STATUS:     09/03/2022    3:28 PM  MMSE - Mini Mental State Exam  Orientation to time 5  Orientation to Place 5  Registration 3  Attention/ Calculation 5  Recall 2  Language- name 2 objects 2  Language- repeat 1  Language- follow  3 step command 3  Language- read & follow direction 1  Write a sentence 1  Copy design 1  Total score 29   awake, alert, oriented to person, place and time recent and remote memory intact normal attention and concentration language fluent, comprehension intact, naming  intact fund of knowledge appropriate  CRANIAL NERVE:  2nd - no papilledema on fundoscopic exam 2nd, 3rd, 4th, 6th - pupils equal and reactive to light, visual fields full to confrontation, extraocular muscles intact, no nystagmus 5th - facial sensation symmetric 7th - facial strength symmetric 8th - hearing intact 9th - palate elevates symmetrically, uvula midline 11th - shoulder shrug symmetric 12th - tongue protrusion midline  MOTOR:  normal bulk and tone, full strength in the BUE, BLE  SENSORY:  normal and symmetric to light touch, temperature, vibration  COORDINATION:  finger-nose-finger, fine finger movements normal  REFLEXES:  deep tendon reflexes present and symmetric  GAIT/STATION:  narrow based gait     DIAGNOSTIC DATA (LABS, IMAGING, TESTING) - I reviewed patient records, labs, notes, testing and imaging myself where available.  Lab Results  Component Value Date   WBC 6.3 10/28/2023   HGB 13.3 10/28/2023   HCT 41.6 10/28/2023   MCV 90 10/28/2023   PLT 438 10/28/2023      Component Value Date/Time   NA 142 10/28/2023 0907   NA 139 10/07/2017 1115   K 4.7 10/28/2023 0907   K 4.3 10/07/2017 1115   CL 102 10/28/2023 0907   CO2 24 10/28/2023 0907   CO2 25 10/07/2017 1115   GLUCOSE 99 10/28/2023 0907   GLUCOSE 100 (H) 01/01/2023 0916   GLUCOSE 103 10/07/2017 1115   BUN 13 10/28/2023 0907   BUN 18.5 10/07/2017 1115   CREATININE 1.09 (H) 10/28/2023 0907   CREATININE 1.02 (H) 01/01/2023 0916   CREATININE 0.9 10/07/2017 1115   CALCIUM 9.5 10/28/2023 0907   CALCIUM 9.2 10/07/2017 1115   PROT 7.0 10/28/2023 0907   PROT 6.7 10/07/2017 1115   ALBUMIN 4.1 10/28/2023 0907   ALBUMIN 3.8 10/07/2017 1115   AST 23 10/28/2023 0907   AST 24 01/01/2023 0916   AST 24 10/07/2017 1115   ALT 18 10/28/2023 0907   ALT 19 01/01/2023 0916   ALT 17 10/07/2017 1115   ALKPHOS 65 10/28/2023 0907   ALKPHOS 63 10/07/2017 1115   BILITOT 0.3 10/28/2023 0907   BILITOT 0.4  01/01/2023 0916   BILITOT 0.72 10/07/2017 1115   GFRNONAA 54 (L) 01/01/2023 0916   GFRAA 50 (L) 10/17/2020 0936   GFRAA 56 (L) 07/04/2020 0901   Lab Results  Component Value Date   CHOL 243 (H) 10/28/2023   HDL 61 10/28/2023   LDLCALC 164 (H) 10/28/2023   TRIG 101 10/28/2023   CHOLHDL 4.0 10/28/2023   Lab Results  Component Value Date   HGBA1C 6.1 (H) 10/28/2023   Lab Results  Component Value Date   VITAMINB12 576 02/17/2021   Lab Results  Component Value Date   TSH 2.400 10/28/2023    04/26/16 MRI of the brain without contrast [I reviewed images myself and agree with interpretation. -VRP]  1.    Extensive T2/FLAIR hyperintense foci in both hemispheres, much more on the right than the left. There are large confluencies of the white matter foci on the right. The foci are most consistent with chronic ischemic change, more than expected for age 36.    There are no acute findings.   04/26/16 MRI of the intracranial arteries  shows the following: 1.    There is a possible focal region of stenosis within one of the M2 segments on the left, though this could be artifactual as no region of definite stenosis was noted on the source images 2.    The other arteries appear to have normal flow. 3.    No aneurysms were detected.   04/26/16 MR angiogram of the neck vessels showing normal antegrade flow with no focal region of stenosis. The origin of the left vertebral artery is not well seen but this is likely to be artifactual as the study is otherwise normal.  08/27/23 MRI brain 1.  No evidence of an acute intracranial abnormality. 2. Chronic small vessel ischemic changes which are moderate-to-severe in the cerebral white matter, and minimal in the pons, similar to the prior brain MRI of 09/22/2022. 3. Unchanged small chronic infarct within the right cerebellar hemisphere. 4. Unchanged chronic microhemorrhage within the left cerebellar hemisphere. 5. Generalized cerebral atrophy, mild for  age.   ASSESSMENT AND PLAN  89 y.o. year old female here with syncope attack on commode in June 2020. Now with worsening memory loss, cognitive decline, change in ADLs since 2022.  Dx:  1. Gait difficulty      PLAN:  GAIT DIFFICULTY - refer to PT evaluation (patient wants to go local PT; she will ask her friend) - use cane / walker - setup life alert / apple watch  MEMORY LOSS (MCI vs mild dementia; also sleep deprivation / insomnia, depression, hearing loss, vision loss) - check MRI brain - start memantine 10mg  at bedtime; increase to twice a day after 1-2 weeks - safety / supervision issues reviewed - daily physical activity / exercise (at least 15-30 minutes) - eat more plants / vegetables - increase social activities, brain stimulation, games, puzzles, hobbies, crafts, arts, music - aim for at least 7-8 hours sleep per night (or more) - avoid smoking and alcohol - caregiver resources provided - caution with medications, finances, living alone; no driving  INSOMNIA / DEPRESSION - follow up with PCP; consider psychiatry / psychology  Return for return to PCP, pending if symptoms worsen or fail to improve.  I spent 45 minutes of face-to-face and non-face-to-face time with patient.  This included previsit chart review, lab review, study review, order entry, electronic health record documentation, patient education.     [name], MD 11/08/2023, 11:06 AM Certified in Neurology, Neurophysiology and Neuroimaging  [facility] [address] [place], [place] [address] [phone]

## 2023-11-08 NOTE — Patient Instructions (Addendum)
  GAIT DIFFICULTY - refer to PT evaluation (patient wants to go local PT; she will ask her friend)  MEMORY LOSS (MCI vs mild dementia; also sleep deprivation / insomnia, depression, hearing loss, vision loss) - continue memantine 10mg  at bedtime; increase to twice a day after 1-2 weeks - safety / supervision issues reviewed - daily physical activity / exercise (at least 15-30 minutes) - eat more plants / vegetables - increase social activities, brain stimulation, games, puzzles, hobbies, crafts, arts, music - aim for at least 7-8 hours sleep per night (or more) - avoid smoking and alcohol - caution with medications, finances; no driving  INSOMNIA / DEPRESSION - follow up with PCP; consider psychiatry / psychology

## 2023-12-03 ENCOUNTER — Other Ambulatory Visit: Payer: Self-pay | Admitting: Family Medicine

## 2023-12-03 DIAGNOSIS — Z1231 Encounter for screening mammogram for malignant neoplasm of breast: Secondary | ICD-10-CM

## 2023-12-10 ENCOUNTER — Telehealth: Payer: Self-pay | Admitting: *Deleted

## 2023-12-10 NOTE — Telephone Encounter (Signed)
  LVM for pt to call office to see about getting this scheduled.         Copied from CRM [id number]. Topic: Appointments - Scheduling Inquiry for Clinic >> Dec 08, 2023  1:32 PM Gaetano Hawthorne wrote: Reason for CRM: Patient missed a call regarding scheduling her Medicare AWV visit - Contact center agent was not able to schedule this visit type for the patient - Please call the patient to setup appointment.

## 2024-01-06 ENCOUNTER — Other Ambulatory Visit: Payer: Self-pay

## 2024-01-06 DIAGNOSIS — C8190 Hodgkin lymphoma, unspecified, unspecified site: Secondary | ICD-10-CM

## 2024-01-07 ENCOUNTER — Inpatient Hospital Stay: Payer: Medicare PPO

## 2024-01-07 ENCOUNTER — Inpatient Hospital Stay: Payer: Medicare PPO | Admitting: Hematology

## 2024-01-07 ENCOUNTER — Telehealth: Payer: Self-pay | Admitting: Hematology and Oncology

## 2024-01-07 NOTE — Telephone Encounter (Signed)
Spoke with patient confirming upcoming appointment  

## 2024-01-12 ENCOUNTER — Ambulatory Visit: Payer: Medicare PPO

## 2024-01-18 ENCOUNTER — Inpatient Hospital Stay: Payer: Medicare PPO

## 2024-01-18 ENCOUNTER — Telehealth: Payer: Self-pay | Admitting: Hematology

## 2024-01-18 ENCOUNTER — Inpatient Hospital Stay: Payer: Medicare PPO | Admitting: Hematology

## 2024-01-18 NOTE — Telephone Encounter (Signed)
Spoke with patient confirming upcoming appointments

## 2024-01-18 NOTE — Progress Notes (Shared)
HEMATOLOGY/ONCOLOGY CLINIC NOTE  Date of Service: 01/18/24   Patient Care Team: Kim Quitter, PA as PCP - General (Family Medicine) Wendall Stade, MD as PCP - Cardiology (Cardiology) Wendall Stade, MD as Consulting Physician (Cardiology) [name], MD as Consulting Physician (Neurology) Donzetta Starch, MD as Consulting Physician (Dermatology) Drema Halon, MD (Inactive) as Consulting Physician (Otolaryngology) Johney Maine, MD as Consulting Physician (Hematology) Venita Lick, MD as Consulting Physician (Orthopedic Surgery) Sheral Apley, MD as Attending Physician (Orthopedic Surgery)  CHIEF COMPLAINTS/PURPOSE OF CONSULTATION:   Follow-up for continued surveillance of Hodgkin's lymphoma Management of osteoporosis with Zometa  HISTORY OF PRESENTING ILLNESS:   plz see previous   INTERVAL HISTORY:  Kim Lane 89 y/o Lane here for her 13-month surveillance visit for Hodgkin's lymphoma and negative dose of Zometa for osteoporosis.  Patient was last seen by me on 01/13/2023 and she complained of occasional hip pain, but was doing well overall.    -Discussed lab results from today, 01/18/2024, in detail with the patient.    MEDICAL HISTORY:  Past Medical History:  Diagnosis Date   Anemia    Anginal pain (HCC)    occ   Arthritis    Blood dyscrasia 12/2016   hodgins lymphoma tx-remission   CAD (coronary artery disease) 02/2006   Taxus stent placed to LAD and Diagonal per Dr. Juanda Chance   Cataracts, bilateral    Complication of anesthesia    HTN (hypertension)    Hypercholesterolemia    Hypothyroidism    IBS (irritable bowel syndrome)    Osteoarthritis    PONV (postoperative nausea and vomiting)    Stroke (HCC) 03/2016  Osteoporosis Cardiologist Dr. Charlton [facility]s [facility] -Dr. Kinnie Scales  SURGICAL HISTORY: Past Surgical History:  Procedure Laterality Date   APPENDECTOMY     IR GENERIC HISTORICAL  01/25/2017   IR US GUIDE  VASC ACCESS RIGHT 01/25/2017 Irish Lack, MD [facility]   IR GENERIC HISTORICAL  01/25/2017   IR FLUORO GUIDE PORT INSERTION RIGHT 01/25/2017 Irish Lack, MD [facility]   IR REMOVAL TUN ACCESS W/ PORT W/O FL MOD SED  10/28/2017   KYPHOPLASTY N/A 06/24/2017   Procedure: KYPHOPLASTY T10;  Surgeon: Venita Lick, MD;  Location: [facility] OR;  Service: Orthopedics;  Laterality: N/A;  90 mins   LEFT HEART CATH AND CORONARY ANGIOGRAPHY N/A 06/16/2017   Procedure: Left Heart Cath and Coronary Angiography;  Surgeon: Tonny Bollman, MD;  Location: [facility] INVASIVE CV LAB;  Service: Cardiovascular;  Laterality: N/A;   MASS EXCISION Right 12/22/2016   Procedure: RIGHT NECK LYMPH NODE BIOPSY;  Surgeon: Drema Halon, MD;  Location:  SURGERY CENTER;  Service: ENT;  Laterality: Right;   SPINE SURGERY     stents     in heart   TONSILLECTOMY     TOTAL KNEE ARTHROPLASTY Bilateral 2011,2012   VAGINAL HYSTERECTOMY      SOCIAL HISTORY: Social History   Socioeconomic History   Marital status: Widowed    Spouse name: Not on file   Number of children: 2   Years of education: 12   Highest education level: Not on file  Occupational History    Employer: [employer]    CommentArboriculturist, retired  Tobacco Use   Smoking status: Former    Passive exposure: Never   Smokeless tobacco: Never   Tobacco comments:    quit smoking 30 years ago, very light smoker  Vaping Use  Vaping status: Never Used  Substance and Sexual Activity   Alcohol use: No    Alcohol/week: 0.0 standard drinks of alcohol   Drug use: No   Sexual activity: Not on file  Other Topics Concern   Not on file  Social History Narrative   08/29/19 Lives alone   caffeine use- coffee- 5 cups daily   Social Drivers of Health   Financial Resource Strain: Low Risk  (02/01/2023)   Overall Financial Resource Strain (CARDIA)    Difficulty of Paying Living Expenses: Not hard at all  Food Insecurity: Unknown (02/01/2023)    Hunger Vital Sign    Worried About Running Out of Food in the Last Year: Not on file    Ran Out of Food in the Last Year: Never true  Transportation Needs: No Transportation Needs (02/01/2023)   PRAPARE - Administrator, Civil Service (Medical): No    Lack of Transportation (Non-Medical): No  Physical Activity: Inactive (02/01/2023)   Exercise Vital Sign    Days of Exercise per Week: 1 day    Minutes of Exercise per Session: 0 min  Stress: No Stress Concern Present (02/01/2023)   [employer] of Occupational Health - Occupational Stress Questionnaire    Feeling of Stress : Not at all  Social Connections: Moderately Integrated (02/01/2023)   Social Connection and Isolation Panel [NHANES]    Frequency of Communication with Friends and Family: More than three times a week    Frequency of Social Gatherings with Friends and Family: More than three times a week    Attends Religious Services: More than 4 times per year    Active Member of [employer] or Organizations: Yes    Attends Banker Meetings: More than 4 times per year    Marital Status: Widowed  Intimate Partner Violence: Not At Risk (02/01/2023)   Humiliation, Afraid, Rape, and Kick questionnaire    Fear of Current or Ex-Partner: No    Emotionally Abused: No    Physically Abused: No    Sexually Abused: No    FAMILY HISTORY: Family History  Problem Relation Age of Onset   Diabetes Mother    Dementia Mother    ALS Mother    Heart Problems Father    Liver disease Sister    Cirrhosis Sister    Diabetes Sister    Dementia Sister    Other Brother        GOOD HEALTH   Other Daughter        GOOD HEALTH   Bone cancer Son    Heart block Son        CABG   Heart block Other        CABG   Breast cancer Neg Hx     ALLERGIES:  Lane allergic to fosamax [alendronate sodium].  MEDICATIONS:  Current Outpatient Medications  Medication Sig Dispense Refill   albuterol (PROAIR HFA) 108 (90 Base) MCG/ACT inhaler  Inhale 2 puffs into the lungs every 4 (four) hours as needed for shortness of breath. 18 g 1   aspirin 81 MG tablet Take 81 mg by mouth every other day.     carvedilol (COREG) 3.125 MG tablet TAKE 1 TABLET BY MOUTH EVERY OTHER MORNING 90 tablet 1   Cholecalciferol (HM VITAMIN D3) 100 MCG (4000 UT) CAPS 4,000 IU QD 30 capsule    diclofenac Sodium (VOLTAREN) 1 % GEL Apply 2 g topically 4 (four) times daily. To affected joint for pain prn 100 g  0   levothyroxine (SYNTHROID) 88 MCG tablet Take 1 tablet (88 mcg total) by mouth daily. 90 tablet 1   Melatonin 2.5 MG CAPS Take 1 capsule by mouth at bedtime.     meloxicam (MOBIC) 15 MG tablet Take 1 tablet (15 mg total) by mouth daily as needed for pain. 30 tablet 1   memantine (NAMENDA) 10 MG tablet Take 1 tablet (10 mg total) by mouth 2 (two) times daily. 60 tablet 12   Multiple Vitamins-Minerals (CENTRUM SILVER PO) Take 1 tablet by mouth daily.     nitroGLYCERIN (NITROSTAT) 0.4 MG SL tablet Place 1 tablet (0.4 mg total) under the tongue every 5 (five) minutes as needed for chest pain. 30 tablet 0   Omega-3 Fatty Acids (FISH OIL) 1000 MG CAPS Take 1 capsule by mouth daily.     RESTASIS 0.05 % ophthalmic emulsion Place 2 drops into both eyes 2 (two) times daily. For dry eyes     No current facility-administered medications for this visit.    REVIEW OF SYSTEMS:  .10 Point review of Systems was done Lane negative except as noted above.  PHYSICAL EXAMINATION:  ECOG PERFORMANCE STATUS: 2 - Symptomatic, <50% confined to bed  There were no vitals filed for this visit.  [name] Kitchen GENERAL:alert, in no acute distress and comfortable SKIN: no acute rashes, no significant lesions EYES: conjunctiva are pink and non-injected, sclera anicteric OROPHARYNX: MMM, no exudates, no oropharyngeal erythema or ulceration NECK: supple, no JVD LYMPH:  no palpable lymphadenopathy in the cervical, axillary or inguinal regions LUNGS: clear to auscultation b/l with normal  respiratory effort HEART: regular rate & rhythm ABDOMEN:  normoactive bowel sounds , non tender, not distended. Extremity: no pedal edema PSYCH: alert & oriented x 3 with fluent speech NEURO: no focal motor/sensory deficits   LABORATORY DATA:  I have reviewed the data as listed   .    Latest Ref Rng & Units 10/28/2023    9:07 AM 05/06/2023   10:10 AM 02/01/2023   11:04 AM  CBC  WBC 3.4 - 10.8 x10E3/uL 6.3  7.4  8.0   Hemoglobin 11.1 - 15.9 g/dL 16.1  09.6  04.5   Hematocrit 34.0 - 46.6 % 41.6  41.3  38.2   Platelets 150 - 450 x10E3/uL 438  443  422    .CBC    Component Value Date/Time   WBC 6.3 10/28/2023 0907   WBC 7.9 01/01/2023 0916   WBC 7.4 06/27/2021 1050   RBC 4.60 10/28/2023 0907   RBC 4.32 01/01/2023 0916   HGB 13.3 10/28/2023 0907   HGB 11.6 10/07/2017 1115   HCT 41.6 10/28/2023 0907   HCT 35.9 10/07/2017 1115   PLT 438 10/28/2023 0907   MCV 90 10/28/2023 0907   MCV 88.0 10/07/2017 1115   MCH 28.9 10/28/2023 0907   MCH 29.4 01/01/2023 0916   MCHC 32.0 10/28/2023 0907   MCHC 33.2 01/01/2023 0916   RDW 12.9 10/28/2023 0907   RDW 15.0 (H) 10/07/2017 1115   LYMPHSABS 1.2 10/28/2023 0907   LYMPHSABS 1.4 10/07/2017 1115   MONOABS 0.6 01/01/2023 0916   MONOABS 0.5 10/07/2017 1115   EOSABS 1.0 (H) 10/28/2023 0907   BASOSABS 0.1 10/28/2023 0907   BASOSABS 0.1 10/07/2017 1115    .    Latest Ref Rng & Units 10/28/2023    9:07 AM 05/06/2023   10:10 AM 02/01/2023   11:14 AM  CMP  Glucose 70 - 99 mg/dL 99  409  811  BUN 8 - 27 mg/dL 13  16  13    Creatinine 0.57 - 1.00 mg/dL 0.98  1.19  1.47   Sodium 134 - 144 mmol/L 142  143  141   Potassium 3.5 - 5.2 mmol/L 4.7  4.6  4.8   Chloride 96 - 106 mmol/L 102  103  103   CO2 20 - 29 mmol/L 24  24  25    Calcium 8.7 - 10.3 mg/dL 9.5  9.8  9.2   Total Protein 6.0 - 8.5 g/dL 7.0  6.9  6.8   Total Bilirubin 0.0 - 1.2 mg/dL 0.3  0.5  0.3   Alkaline Phos 44 - 121 IU/L 65  60  60   AST 0 - 40 IU/L 23  27  20    ALT 0 -  32 IU/L 18  22  15    25  OH Vit d levels 38.61  RADIOGRAPHIC STUDIES: I have personally reviewed the radiological images as listed and agreed with the findings in the report. No results found.   ASSESSMENT & PLAN:   89 y.o. Caucasian Lane with ECOG performance status of 2 with  1) history of classical Hodgkin's lymphoma of nodular sclerosis variety At least Stage IIIB some concern for possible Rt lung involvement which makes it Stage IVB Presented with right neck lymph nodes . She had type B constitutional symptoms with 15-20 pound weight loss night sweats or chills .noted to have significant pruritus with mild rash likely from Hodgkin's which remain resolved  Wt Readings from Last 3 Encounters:  11/08/23 125 lb (56.7 kg)  11/04/23 125 lb (56.7 kg)  09/02/23 125 lb (56.7 kg)  PET/CT scan after 5 cycles shows marked response to chemotherapy and stable predominantly Deauville 2 as per discussion with the radiology.  S/p 5 cycles of AVD with last Cycle of AVD on 05/19/17  PLAN:  -discussed lab results on 01/01/23 with patient. Eosinophil high. WBC of 7.9 k, hemoglobin of 12.7 k, platelets 385 k. Light counts normal CBC okay beside mild chronic eosinophilia. CMP stable Patient no lab or clinical evidence of Hodgkins lymphoma recurrence at t h -switch to Zometa once a year after today's dose  3) T10 compression fracture based on imaging done on 05/22/2017 in ED Now status post kyphoplasty with significant improvement in her back pain  4) Osteoporosis PET/CT scan also shows possible L4 compression fracture . These compression fractures appear to be related to osteoporosis . Also had rib fracture related to no significant trauma She was previously on Fosamax that cause generalized body aches . PLAN:  -25-hydroxy vitamin D levels at 38 -maintain compliance with current dose of vitamin D of 5000 units daily of D3 and recheck levels with PCP in 3-4 months and increase dose to achieve  25OH vit D levels of 60-90. -discussed with patient and changing Zometa every 12 months. -Continue to optimize thyroid replacement with primary care physician. -Fall precautions  5) h/o triple-vessel coronary artery disease based on cardiac cath: patient Lane surprisingly asymptomatic. -Continue management as per PCP and cardiology.  6) . Patient Active Problem List   Diagnosis Date Noted   Recurrent falls 08/05/2023   Dysfunction of right eustachian tube 05/12/2022   Presbycusis of both ears 05/12/2022   Vasomotor rhinitis 10/29/2021   Stage 3a chronic kidney disease (HCC) 12/12/2019   Degenerative disc disease, lumbar- MRI 6/ 2018 08/03/2019   Neuropathy involving both lower extremities- due to DDD of L-Spine 08/03/2019   Elevated serum creatinine 08/03/2019  Prediabetes 06/20/2019   Hyperlipidemia 06/20/2019   History of CVA (cerebrovascular accident)- 2017-   05/31/2019   Syncope and collapse 05/31/2019   Maceration of skin 05/31/2019   Degeneration of lumbar intervertebral disc 04/11/2019   Lumbar radiculopathy 04/11/2019   Localized, primary osteoarthritis 12/07/2018   Sleep difficulties 09/11/2018   Arthritis 08/10/2018   S/P kyphoplasty- dr brooks- ortho 04/05/2018   Postmenopausal 04/05/2018   Trigger finger, acquired- R 3rd digit 01/26/2018   Osteoarthritis of finger 01/26/2018   Vitamin D deficiency 11/24/2017   Glucose intolerance (impaired glucose tolerance) 11/24/2017   Osteoarthritis 09/16/2017   History of iron deficiency anemia-  by ONC 07/23/2017   Osteoporosis 07/23/2017   CAD S/P percutaneous coronary angioplasty 07/02/2017   Closed compression fracture of L2 vertebra (HCC) 06/24/2017   Hodgkin lymphoma (HCC) 04/26/2017   Port catheter in place 02/02/2017   Nodular sclerosis Hodgkin lymphoma of lymph nodes of neck (HCC) 01/13/2017   Epistaxis 06/02/2011   Essential hypertension, benign 01/08/2009   Coronary atherosclerosis- sees Dr. Eden Emms 01/08/2009    Hypothyroidism 01/08/2009   PLAN: -Continue follow-up with primary care physician for management of other chronic medical co-morbidities.   FOLLOW UP: ***  The total time spent in the appointment was *** minutes* .  All of the patient's questions were answered with apparent satisfaction. The patient knows to call the clinic with any problems, questions or concerns.   Wyvonnia Lora MD MS AAHIVMS [facility] Hematology/Oncology Physician [facility]  .*Total Encounter Time as defined by the Centers for Medicare and Medicaid Services includes, in addition to the face-to-face time of a patient visit (documented in the note above) non-face-to-face time: obtaining and reviewing outside history, ordering and reviewing medications, tests or procedures, care coordination (communications with other health care professionals or caregivers) and documentation in the medical record.   I,Param Shah,acting as a Neurosurgeon for Wyvonnia Lora, MD.,have documented all relevant documentation on the behalf of Wyvonnia Lora, MD,as directed by  Wyvonnia Lora, MD while in the presence of Wyvonnia Lora, MD.

## 2024-01-24 ENCOUNTER — Ambulatory Visit
Admission: RE | Admit: 2024-01-24 | Discharge: 2024-01-24 | Disposition: A | Discharge status: Home / Self Care | Payer: Medicare PPO | Source: Ambulatory Visit | Attending: Family Medicine | Admitting: Family Medicine

## 2024-01-24 DIAGNOSIS — Z1231 Encounter for screening mammogram for malignant neoplasm of breast: Secondary | ICD-10-CM

## 2024-01-26 ENCOUNTER — Inpatient Hospital Stay: Payer: Medicare PPO

## 2024-01-26 ENCOUNTER — Inpatient Hospital Stay: Payer: Medicare PPO | Attending: Hematology

## 2024-01-26 ENCOUNTER — Inpatient Hospital Stay (HOSPITAL_BASED_OUTPATIENT_CLINIC_OR_DEPARTMENT_OTHER): Payer: Medicare PPO | Admitting: Hematology

## 2024-01-26 VITALS — BP 167/70 | HR 65 | Resp 18

## 2024-01-26 VITALS — BP 140/59 | HR 63 | Temp 98.2°F | Resp 17 | Ht 61.0 in | Wt 127.8 lb

## 2024-01-26 DIAGNOSIS — Z833 Family history of diabetes mellitus: Secondary | ICD-10-CM | POA: Insufficient documentation

## 2024-01-26 DIAGNOSIS — C8100 Nodular lymphocyte predominant Hodgkin lymphoma, unspecified site: Secondary | ICD-10-CM | POA: Insufficient documentation

## 2024-01-26 DIAGNOSIS — G629 Polyneuropathy, unspecified: Secondary | ICD-10-CM | POA: Diagnosis not present

## 2024-01-26 DIAGNOSIS — Z8379 Family history of other diseases of the digestive system: Secondary | ICD-10-CM | POA: Insufficient documentation

## 2024-01-26 DIAGNOSIS — Z79899 Other long term (current) drug therapy: Secondary | ICD-10-CM | POA: Diagnosis not present

## 2024-01-26 DIAGNOSIS — Z8673 Personal history of transient ischemic attack (TIA), and cerebral infarction without residual deficits: Secondary | ICD-10-CM | POA: Insufficient documentation

## 2024-01-26 DIAGNOSIS — C8111 Nodular sclerosis classical Hodgkin lymphoma, lymph nodes of head, face, and neck: Secondary | ICD-10-CM | POA: Diagnosis present

## 2024-01-26 DIAGNOSIS — Z818 Family history of other mental and behavioral disorders: Secondary | ICD-10-CM | POA: Insufficient documentation

## 2024-01-26 DIAGNOSIS — Z95828 Presence of other vascular implants and grafts: Secondary | ICD-10-CM

## 2024-01-26 DIAGNOSIS — I2510 Atherosclerotic heart disease of native coronary artery without angina pectoris: Secondary | ICD-10-CM | POA: Diagnosis not present

## 2024-01-26 DIAGNOSIS — Z8249 Family history of ischemic heart disease and other diseases of the circulatory system: Secondary | ICD-10-CM | POA: Diagnosis not present

## 2024-01-26 DIAGNOSIS — M25561 Pain in right knee: Secondary | ICD-10-CM | POA: Insufficient documentation

## 2024-01-26 DIAGNOSIS — M25559 Pain in unspecified hip: Secondary | ICD-10-CM | POA: Insufficient documentation

## 2024-01-26 DIAGNOSIS — C8190 Hodgkin lymphoma, unspecified, unspecified site: Secondary | ICD-10-CM

## 2024-01-26 DIAGNOSIS — M25571 Pain in right ankle and joints of right foot: Secondary | ICD-10-CM | POA: Insufficient documentation

## 2024-01-26 DIAGNOSIS — N1831 Chronic kidney disease, stage 3a: Secondary | ICD-10-CM | POA: Insufficient documentation

## 2024-01-26 DIAGNOSIS — F0390 Unspecified dementia without behavioral disturbance: Secondary | ICD-10-CM | POA: Diagnosis not present

## 2024-01-26 DIAGNOSIS — M810 Age-related osteoporosis without current pathological fracture: Secondary | ICD-10-CM | POA: Insufficient documentation

## 2024-01-26 DIAGNOSIS — Z87891 Personal history of nicotine dependence: Secondary | ICD-10-CM | POA: Insufficient documentation

## 2024-01-26 LAB — CBC WITH DIFFERENTIAL (CANCER CENTER ONLY)
Abs Immature Granulocytes: 0.03 10*3/uL (ref 0.00–0.07)
Basophils Absolute: 0.1 10*3/uL (ref 0.0–0.1)
Basophils Relative: 2 %
Eosinophils Absolute: 0.6 10*3/uL — ABNORMAL HIGH (ref 0.0–0.5)
Eosinophils Relative: 8 %
HCT: 39.3 % (ref 36.0–46.0)
Hemoglobin: 12.8 g/dL (ref 12.0–15.0)
Immature Granulocytes: 0 %
Lymphocytes Relative: 19 %
Lymphs Abs: 1.3 10*3/uL (ref 0.7–4.0)
MCH: 29.3 pg (ref 26.0–34.0)
MCHC: 32.6 g/dL (ref 30.0–36.0)
MCV: 89.9 fL (ref 80.0–100.0)
Monocytes Absolute: 0.5 10*3/uL (ref 0.1–1.0)
Monocytes Relative: 7 %
Neutro Abs: 4.5 10*3/uL (ref 1.7–7.7)
Neutrophils Relative %: 64 %
Platelet Count: 342 10*3/uL (ref 150–400)
RBC: 4.37 MIL/uL (ref 3.87–5.11)
RDW: 14.5 % (ref 11.5–15.5)
WBC Count: 7.1 10*3/uL (ref 4.0–10.5)
nRBC: 0 % (ref 0.0–0.2)

## 2024-01-26 LAB — CMP (CANCER CENTER ONLY)
ALT: 14 U/L (ref 0–44)
AST: 22 U/L (ref 15–41)
Albumin: 4.3 g/dL (ref 3.5–5.0)
Alkaline Phosphatase: 55 U/L (ref 38–126)
Anion gap: 5 (ref 5–15)
BUN: 15 mg/dL (ref 8–23)
CO2: 28 mmol/L (ref 22–32)
Calcium: 9.3 mg/dL (ref 8.9–10.3)
Chloride: 106 mmol/L (ref 98–111)
Creatinine: 1.1 mg/dL — ABNORMAL HIGH (ref 0.44–1.00)
GFR, Estimated: 49 mL/min — ABNORMAL LOW (ref >60)
Glucose, Bld: 109 mg/dL — ABNORMAL HIGH (ref 70–99)
Potassium: 4.3 mmol/L (ref 3.5–5.1)
Sodium: 139 mmol/L (ref 135–145)
Total Bilirubin: 0.4 mg/dL (ref 0.0–1.2)
Total Protein: 7.3 g/dL (ref 6.5–8.1)

## 2024-01-26 LAB — VITAMIN D 25 HYDROXY (VIT D DEFICIENCY, FRACTURES): Vit D, 25-Hydroxy: 63.04 ng/mL (ref 30–100)

## 2024-01-26 MED ORDER — SODIUM CHLORIDE 0.9 % IV SOLN
Freq: Once | INTRAVENOUS | Status: AC
Start: 2024-01-26 — End: 2024-01-26

## 2024-01-26 MED ORDER — ZOLEDRONIC ACID 4 MG/100ML IV SOLN
4.0000 mg | Freq: Once | INTRAVENOUS | Status: AC
  Administered 2024-01-26: 4 mg via INTRAVENOUS
  Filled 2024-01-26: qty 100

## 2024-01-26 NOTE — Progress Notes (Signed)
 HEMATOLOGY/ONCOLOGY CLINIC NOTE  Date of Service: 01/26/24   Patient Care Team: Wallace Joesph LABOR, PA as PCP - General (Family Medicine) Delford Maude BROCKS, Kim as PCP - Cardiology (Cardiology) Delford Maude BROCKS, Kim as Consulting Physician (Cardiology) Margaret, Eduard SAUNDERS, Kim as Consulting Physician (Neurology) Joshua Blamer, Kim as Consulting Physician (Dermatology) Ethyl Lonni BRAVO, Kim (Inactive) as Consulting Physician (Otolaryngology) Onesimo Kim Brink, Kim as Consulting Physician (Hematology) Burnetta Aures, Kim as Consulting Physician (Orthopedic Surgery) Beverley Evalene BIRCH, Kim as Attending Physician (Orthopedic Surgery)  CHIEF COMPLAINTS/PURPOSE OF CONSULTATION:   Follow-up for continued surveillance of Hodgkin's lymphoma Management of osteoporosis with Zometa   HISTORY OF PRESENTING ILLNESS:   plz see previous   INTERVAL HISTORY:  Kim Lane is an 89 y/o female here for her 52-month surveillance visit for Hodgkin's lymphoma and negative dose of Zometa  for osteoporosis.  Patient was last seen by me on 01/01/2023 and reported a fall which was noted to cause sharp right knee pain and right ankle pain. Patient also complained of occasional hip pain during the visit. Patient reports some issues with her blood vessels in the brain.  Today, she reports that she has been doing well overall with no new medical concerns over the last year.   She reports feet numbness/pain at night, which improves with movement. Patient sometimes endorses mild back pain.   She has been tolerating Zometa  infusions well with no new or major toxicity issues.   Patient complains of some memory issues and is cold constantly. She reports that she has gained a couple of pounds recently and her current weight is 127 pounds. Patient denies any fever, night sweats, lumps/bumps, falls, or itching.   She reports that she will have a dental cleaning appointment in 03-26-24. Patient notes that there have  been discussions regarding considerations of crown removal procedures, though she is not inclined to proceed at this time.    Patient reports that her son passed away in 2024/03/26 last year from esophageal cancer.    Patient functions independently at home. She does use a cane to move around and continues to drive. Her daughter will move to [facility] soon and patient does have additional family close by.  MEDICAL HISTORY:  Past Medical History:  Diagnosis Date   Anemia    Anginal pain (HCC)    occ   Arthritis    Blood dyscrasia 12/2016   hodgins lymphoma tx-remission   CAD (coronary artery disease) 2006/03/26   Taxus stent placed to LAD and Diagonal per Dr. Obie   Cataracts, bilateral    Complication of anesthesia    HTN (hypertension)    Hypercholesterolemia    Hypothyroidism    IBS (irritable bowel syndrome)    Osteoarthritis    PONV (postoperative nausea and vomiting)    Stroke (HCC) 03/2016  Osteoporosis Cardiologist Dr. Maude Delford GI -Dr. Luis  SURGICAL HISTORY: Past Surgical History:  Procedure Laterality Date   APPENDECTOMY     IR GENERIC HISTORICAL  01/25/2017   IR US  GUIDE VASC ACCESS RIGHT 01/25/2017 Marcey Moan, Kim [facility]   IR GENERIC HISTORICAL  01/25/2017   IR FLUORO GUIDE PORT INSERTION RIGHT 01/25/2017 Marcey Moan, [facility]   IR REMOVAL TUN ACCESS W/ PORT W/O FL MOD SED  10/28/2017   KYPHOPLASTY N/A 06/24/2017   Procedure: KYPHOPLASTY T10;  Surgeon: Burnetta Aures, Kim;  Location: [facility] OR;  Service: Orthopedics;  Laterality: N/A;  90 mins   LEFT HEART CATH AND  CORONARY ANGIOGRAPHY N/A 06/16/2017   Procedure: Left Heart Cath and Coronary Angiography;  Surgeon: Wonda Sharper, Kim;  Location: [facility] INVASIVE CV LAB;  Service: Cardiovascular;  Laterality: N/A;   MASS EXCISION Right 12/22/2016   Procedure: RIGHT NECK LYMPH NODE BIOPSY;  Surgeon: Lonni FORBES Angle, Kim;  Location: [facility];  Service: ENT;  Laterality: Right;   SPINE SURGERY      stents     in heart   TONSILLECTOMY     TOTAL KNEE ARTHROPLASTY Bilateral 2011,2012   VAGINAL HYSTERECTOMY      SOCIAL HISTORY: Social History   Socioeconomic History   Marital status: Widowed    Spouse name: Not on file   Number of children: 2   Years of education: 12   Highest education level: Not on file  Occupational History    Employer: [employer]    CommentArboriculturist, retired  Tobacco Use   Smoking status: Former    Passive exposure: Never   Smokeless tobacco: Never   Tobacco comments:    quit smoking 30 years ago, very light smoker  Vaping Use   Vaping status: Never Used  Substance and Sexual Activity   Alcohol use: No    Alcohol/week: 0.0 standard drinks of alcohol   Drug use: No   Sexual activity: Not on file  Other Topics Concern   Not on file  Social History Narrative   08/29/19 Lives alone   caffeine use- coffee- 5 cups daily   Social Drivers of Health   Financial Resource Strain: Low Risk  (02/01/2023)   Overall Financial Resource Strain (CARDIA)    Difficulty of Paying Living Expenses: Not hard at all  Food Insecurity: Unknown (02/01/2023)   Hunger Vital Sign    Worried About Running Out of Food in the Last Year: Not on file    Ran Out of Food in the Last Year: Never true  Transportation Needs: No Transportation Needs (02/01/2023)   PRAPARE - Administrator, Civil Service (Medical): No    Lack of Transportation (Non-Medical): No  Physical Activity: Inactive (02/01/2023)   Exercise Vital Sign    Days of Exercise per Week: 1 day    Minutes of Exercise per Session: 0 min  Stress: No Stress Concern Present (02/01/2023)   [employer] of Occupational Health - Occupational Stress Questionnaire    Feeling of Stress : Not at all  Social Connections: Moderately Integrated (02/01/2023)   Social Connection and Isolation Panel [NHANES]    Frequency of Communication with Friends and Family: More than three times a week    Frequency  of Social Gatherings with Friends and Family: More than three times a week    Attends Religious Services: More than 4 times per year    Active Member of [employer] or Organizations: Yes    Attends Banker Meetings: More than 4 times per year    Marital Status: Widowed  Intimate Partner Violence: Not At Risk (02/01/2023)   Humiliation, Afraid, Rape, and Kick questionnaire    Fear of Current or Ex-Partner: No    Emotionally Abused: No    Physically Abused: No    Sexually Abused: No    FAMILY HISTORY: Family History  Problem Relation Age of Onset   Diabetes Mother    Dementia Mother    ALS Mother    Heart Problems Father    Liver disease Sister    Cirrhosis Sister    Diabetes Sister  Dementia Sister    Other Brother        GOOD HEALTH   Other Daughter        GOOD HEALTH   Bone cancer Son    Heart block Son        CABG   Heart block Other        CABG   Breast cancer Neg Hx     ALLERGIES:  is allergic to fosamax [alendronate sodium].  MEDICATIONS:  Current Outpatient Medications  Medication Sig Dispense Refill   albuterol  (PROAIR  HFA) 108 (90 Base) MCG/ACT inhaler Inhale 2 puffs into the lungs every 4 (four) hours as needed for shortness of breath. 18 g 1   aspirin  81 MG tablet Take 81 mg by mouth every other day.     carvedilol  (COREG ) 3.125 MG tablet TAKE 1 TABLET BY MOUTH EVERY OTHER MORNING 90 tablet 1   Cholecalciferol (HM VITAMIN D3) 100 MCG (4000 UT) CAPS 4,000 IU QD 30 capsule    diclofenac  Sodium (VOLTAREN ) 1 % GEL Apply 2 g topically 4 (four) times daily. To affected joint for pain prn 100 g 0   levothyroxine  (SYNTHROID ) 88 MCG tablet Take 1 tablet (88 mcg total) by mouth daily. 90 tablet 1   Melatonin 2.5 MG CAPS Take 1 capsule by mouth at bedtime.     meloxicam  (MOBIC ) 15 MG tablet Take 1 tablet (15 mg total) by mouth daily as needed for pain. 30 tablet 1   memantine  (NAMENDA ) 10 MG tablet Take 1 tablet (10 mg total) by mouth 2 (two) times daily. 60  tablet 12   Multiple Vitamins-Minerals (CENTRUM SILVER PO) Take 1 tablet by mouth daily.     nitroGLYCERIN  (NITROSTAT ) 0.4 MG SL tablet Place 1 tablet (0.4 mg total) under the tongue every 5 (five) minutes as needed for chest pain. 30 tablet 0   Omega-3 Fatty Acids (FISH OIL) 1000 MG CAPS Take 1 capsule by mouth daily.     RESTASIS  0.05 % ophthalmic emulsion Place 2 drops into both eyes 2 (two) times daily. For dry eyes     No current facility-administered medications for this visit.    REVIEW OF SYSTEMS:   10 Point review of Systems was done is negative except as noted above.   PHYSICAL EXAMINATION:  ECOG PERFORMANCE STATUS: 2 - Symptomatic, <50% confined to bed  Vitals:   01/26/24 1332  BP: (!) 140/59  Pulse: 63  Resp: 17  Temp: 98.2 F (36.8 C)  TempSrc: Oral  SpO2: 99%  Weight: 127 lb 12.8 oz (58 kg)  Height: 5' 1 (1.549 m)   GENERAL:alert, in no acute distress and comfortable SKIN: no acute rashes, no significant lesions EYES: conjunctiva are pink and non-injected, sclera anicteric OROPHARYNX: MMM, no exudates, no oropharyngeal erythema or ulceration NECK: supple, no JVD LYMPH:  no palpable lymphadenopathy in the cervical, axillary or inguinal regions LUNGS: clear to auscultation b/l with normal respiratory effort HEART: regular rate & rhythm ABDOMEN:  normoactive bowel sounds , non tender, not distended. Extremity: no pedal edema PSYCH: alert & oriented x 3 with fluent speech NEURO: no focal motor/sensory deficits    LABORATORY DATA:  I have reviewed the data as listed   .    Latest Ref Rng & Units 01/26/2024    1:10 PM 10/28/2023    9:07 AM 05/06/2023   10:10 AM  CBC  WBC 4.0 - 10.5 K/uL 7.1  6.3  7.4   Hemoglobin 12.0 - 15.0 g/dL 12.8  13.3  13.5   Hematocrit 36.0 - 46.0 % 39.3  41.6  41.3   Platelets 150 - 400 K/uL 342  438  443    .CBC    Component Value Date/Time   WBC 7.1 01/26/2024 1310   WBC 7.4 06/27/2021 1050   RBC 4.37 01/26/2024 1310   HGB  12.8 01/26/2024 1310   HGB 13.3 10/28/2023 0907   HGB 11.6 10/07/2017 1115   HCT 39.3 01/26/2024 1310   HCT 41.6 10/28/2023 0907   HCT 35.9 10/07/2017 1115   PLT 342 01/26/2024 1310   PLT 438 10/28/2023 0907   MCV 89.9 01/26/2024 1310   MCV 90 10/28/2023 0907   MCV 88.0 10/07/2017 1115   MCH 29.3 01/26/2024 1310   MCHC 32.6 01/26/2024 1310   RDW 14.5 01/26/2024 1310   RDW 12.9 10/28/2023 0907   RDW 15.0 (H) 10/07/2017 1115   LYMPHSABS 1.3 01/26/2024 1310   LYMPHSABS 1.2 10/28/2023 0907   LYMPHSABS 1.4 10/07/2017 1115   MONOABS 0.5 01/26/2024 1310   MONOABS 0.5 10/07/2017 1115   EOSABS 0.6 (H) 01/26/2024 1310   EOSABS 1.0 (H) 10/28/2023 0907   BASOSABS 0.1 01/26/2024 1310   BASOSABS 0.1 10/28/2023 0907   BASOSABS 0.1 10/07/2017 1115    .    Latest Ref Rng & Units 01/26/2024    1:10 PM 10/28/2023    9:07 AM 05/06/2023   10:10 AM  CMP  Glucose 70 - 99 mg/dL 890  99  894   BUN 8 - 23 mg/dL 15  13  16    Creatinine 0.44 - 1.00 mg/dL 8.89  8.90  8.87   Sodium 135 - 145 mmol/L 139  142  143   Potassium 3.5 - 5.1 mmol/L 4.3  4.7  4.6   Chloride 98 - 111 mmol/L 106  102  103   CO2 22 - 32 mmol/L 28  24  24    Calcium  8.9 - 10.3 mg/dL 9.3  9.5  9.8   Total Protein 6.5 - 8.1 g/dL 7.3  7.0  6.9   Total Bilirubin 0.0 - 1.2 mg/dL 0.4  0.3  0.5   Alkaline Phos 38 - 126 U/L 55  65  60   AST 15 - 41 U/L 22  23  27    ALT 0 - 44 U/L 14  18  22    25  OH Vit d levels 38.61  RADIOGRAPHIC STUDIES: I have personally reviewed the radiological images as listed and agreed with the findings in the report. No results found.   ASSESSMENT & PLAN:   89 y.o. Caucasian female with ECOG performance status of 2 with  1) history of classical Hodgkin's lymphoma of nodular sclerosis variety At least Stage IIIB some concern for possible Rt lung involvement which makes it Stage IVB Presented with right neck lymph nodes . She had type B constitutional symptoms with 15-20 pound weight loss night sweats  or chills .noted to have significant pruritus with mild rash likely from Hodgkin's which remain resolved  Wt Readings from Last 3 Encounters:  11/08/23 125 lb (56.7 kg)  11/04/23 125 lb (56.7 kg)  09/02/23 125 lb (56.7 kg)  PET/CT scan after 5 cycles shows marked response to chemotherapy and stable predominantly Deauville 2 as per discussion with the radiology.  S/p 5 cycles of AVD with last Cycle of AVD on 05/19/17  PLAN:  -discussed lab results on 01/01/23 with patient. Eosinophil high. WBC of 7.9 k, hemoglobin of 12.7 k, platelets 385  kSABRA Mallory Lane normal CBC okay beside mild chronic eosinophilia. CMP stable Patient no lab or clinical evidence of Hodgkins lymphoma recurrence at this time. -switch to Zometa  once a year after today's dose  3) T10 compression fracture based on imaging done on 05/22/2017 in ED Now status post kyphoplasty with significant improvement in her back pain  4) Osteoporosis PET/CT scan also shows possible L4 compression fracture . These compression fractures appear to be related to osteoporosis . Also had rib fracture related to no significant trauma She was previously on Fosamax that cause generalized body aches .  PLAN:  -25-hydroxy vitamin D  levels at 38 -maintain compliance with current dose of vitamin D  of 5000 units daily of D3 and recheck levels with PCP in 3-4 months and increase dose to achieve 25OH vit D levels of 60-90. -discussed with patient and changing Zometa  every 12 months. -Continue to optimize thyroid  replacement with primary care physician. -Fall precautions  5) h/o triple-vessel coronary artery disease based on cardiac cath: patient is surprisingly asymptomatic. -Continue management as per PCP and cardiology.  6) . Patient Active Problem List   Diagnosis Date Noted   Recurrent falls 08/05/2023   Dysfunction of right eustachian tube 05/12/2022   Presbycusis of both ears 05/12/2022   Vasomotor rhinitis 10/29/2021   Stage 3a chronic  kidney disease (HCC) 12/12/2019   Degenerative disc disease, lumbar- MRI 6/ 2018 08/03/2019   Neuropathy involving both lower extremities- due to DDD of L-Spine 08/03/2019   Elevated serum creatinine 08/03/2019   Prediabetes 06/20/2019   Hyperlipidemia 06/20/2019   History of CVA (cerebrovascular accident)- 2017-   05/31/2019   Syncope and collapse 05/31/2019   Maceration of skin 05/31/2019   Degeneration of lumbar intervertebral disc 04/11/2019   Lumbar radiculopathy 04/11/2019   Localized, primary osteoarthritis 12/07/2018   Sleep difficulties 09/11/2018   Arthritis 08/10/2018   S/P kyphoplasty- dr brooks- ortho 04/05/2018   Postmenopausal 04/05/2018   Trigger finger, acquired- R 3rd digit 01/26/2018   Osteoarthritis of finger 01/26/2018   Vitamin D  deficiency 11/24/2017   Glucose intolerance (impaired glucose tolerance) 11/24/2017   Osteoarthritis 09/16/2017   History of iron  deficiency anemia-  by ONC 07/23/2017   Osteoporosis 07/23/2017   CAD S/P percutaneous coronary angioplasty 07/02/2017   Closed compression fracture of L2 vertebra (HCC) 06/24/2017   Hodgkin lymphoma (HCC) 04/26/2017   Port catheter in place 02/02/2017   Nodular sclerosis Hodgkin lymphoma of lymph nodes of neck (HCC) 01/13/2017   Epistaxis 06/02/2011   Essential hypertension, benign 01/08/2009   Coronary atherosclerosis- sees Dr. Delford 01/08/2009   Hypothyroidism 01/08/2009   PLAN:  -Discussed lab results on 01/26/24 in detail with patient. CBC stable, showed WBC of 7.1K, hemoglobin of 12.8, and platelets of 342K. -CMP pending at time of clinical visit -no clinical sign or lab evidence of Hodgkin's lymphoma progression at this time -patient has been in remission for 7 years -discussed that her neuropathy is most likely from pinch nerves in the back -educated patient that generally, there would be a role to hold Zometa  for 1-2 months before and after any significant dental procedure as Zometa  can delay  the healing process. Patient does not have any significant upcoming dental procedures at this time.  -continue Zometa  infusion once a year for osteoporosis -she shall return to clinic with us  in 1 year with labs -Continue follow-up with primary care physician for management of other chronic medical co-morbidities.  FOLLOW UP: RTC with Dr. Onesimo with labs and next dose  of Zometa  in 12 months.  The total time spent in the appointment was 21 minutes* .  All of the patient's questions were answered with apparent satisfaction. The patient knows to call the clinic with any problems, questions or concerns.   Kim Lane Kim Lane [facility] Hematology/Oncology Physician [facility]  .*Total Encounter Time as defined by the Centers for Medicare and Medicaid Services includes, in addition to the face-to-face time of a patient visit (documented in the note above) non-face-to-face time: obtaining and reviewing outside history, ordering and reviewing medications, tests or procedures, care coordination (communications with other health care professionals or caregivers) and documentation in the medical record.    I,Kim Lane,acting as a neurosurgeon for Kim Saran, Kim.,have documented all relevant documentation on the behalf of Kim Saran, Kim,as directed by  Kim Saran, Kim while in the presence of Kim Saran, Kim.  .I have reviewed the above documentation for accuracy and completeness, and I agree with the above. .Kim Lane

## 2024-01-27 ENCOUNTER — Encounter: Payer: Self-pay | Admitting: Family Medicine

## 2024-01-28 ENCOUNTER — Telehealth: Payer: Self-pay | Admitting: Hematology

## 2024-01-28 NOTE — Telephone Encounter (Signed)
 Left patient a vm regarding upcoming appointment

## 2024-02-01 ENCOUNTER — Encounter: Payer: Self-pay | Admitting: Hematology

## 2024-02-25 ENCOUNTER — Other Ambulatory Visit: Payer: Medicare PPO

## 2024-02-25 DIAGNOSIS — N1831 Chronic kidney disease, stage 3a: Secondary | ICD-10-CM | POA: Diagnosis not present

## 2024-02-25 DIAGNOSIS — R7303 Prediabetes: Secondary | ICD-10-CM

## 2024-02-25 DIAGNOSIS — E559 Vitamin D deficiency, unspecified: Secondary | ICD-10-CM

## 2024-02-25 DIAGNOSIS — E785 Hyperlipidemia, unspecified: Secondary | ICD-10-CM

## 2024-02-25 DIAGNOSIS — E039 Hypothyroidism, unspecified: Secondary | ICD-10-CM | POA: Diagnosis not present

## 2024-02-25 DIAGNOSIS — I10 Essential (primary) hypertension: Secondary | ICD-10-CM | POA: Diagnosis not present

## 2024-02-26 LAB — CBC WITH DIFFERENTIAL/PLATELET
Basophils Absolute: 0.1 10*3/uL (ref 0.0–0.2)
Basos: 2 %
EOS (ABSOLUTE): 0.7 10*3/uL — ABNORMAL HIGH (ref 0.0–0.4)
Eos: 11 %
Hematocrit: 40.8 % (ref 34.0–46.6)
Hemoglobin: 13.5 g/dL (ref 11.1–15.9)
Immature Grans (Abs): 0 10*3/uL (ref 0.0–0.1)
Immature Granulocytes: 0 %
Lymphocytes Absolute: 1.4 10*3/uL (ref 0.7–3.1)
Lymphs: 22 %
MCH: 29.7 pg (ref 26.6–33.0)
MCHC: 33.1 g/dL (ref 31.5–35.7)
MCV: 90 fL (ref 79–97)
Monocytes Absolute: 0.6 10*3/uL (ref 0.1–0.9)
Monocytes: 9 %
Neutrophils Absolute: 3.7 10*3/uL (ref 1.4–7.0)
Neutrophils: 56 %
Platelets: 410 10*3/uL (ref 150–450)
RBC: 4.55 x10E6/uL (ref 3.77–5.28)
RDW: 13.2 % (ref 11.7–15.4)
WBC: 6.5 10*3/uL (ref 3.4–10.8)

## 2024-02-26 LAB — LIPID PANEL
Chol/HDL Ratio: 4.1 ratio (ref 0.0–4.4)
Cholesterol, Total: 257 mg/dL — ABNORMAL HIGH (ref 100–199)
HDL: 63 mg/dL (ref >39)
LDL Chol Calc (NIH): 169 mg/dL — ABNORMAL HIGH (ref 0–99)
Triglycerides: 140 mg/dL (ref 0–149)
VLDL Cholesterol Cal: 25 mg/dL (ref 5–40)

## 2024-02-26 LAB — TSH RFX ON ABNORMAL TO FREE T4: TSH: 3.8 u[IU]/mL (ref 0.450–4.500)

## 2024-02-26 LAB — COMPREHENSIVE METABOLIC PANEL
ALT: 18 IU/L (ref 0–32)
AST: 28 IU/L (ref 0–40)
Albumin: 4.4 g/dL (ref 3.7–4.7)
Alkaline Phosphatase: 62 IU/L (ref 44–121)
BUN/Creatinine Ratio: 11 — ABNORMAL LOW (ref 12–28)
BUN: 11 mg/dL (ref 8–27)
Bilirubin Total: 0.5 mg/dL (ref 0.0–1.2)
CO2: 24 mmol/L (ref 20–29)
Calcium: 9.5 mg/dL (ref 8.7–10.3)
Chloride: 104 mmol/L (ref 96–106)
Creatinine, Ser: 1.04 mg/dL — ABNORMAL HIGH (ref 0.57–1.00)
Globulin, Total: 2.7 g/dL (ref 1.5–4.5)
Glucose: 104 mg/dL — ABNORMAL HIGH (ref 70–99)
Potassium: 4.7 mmol/L (ref 3.5–5.2)
Sodium: 144 mmol/L (ref 134–144)
Total Protein: 7.1 g/dL (ref 6.0–8.5)
eGFR: 52 mL/min/{1.73_m2} — ABNORMAL LOW (ref >59)

## 2024-02-26 LAB — HEMOGLOBIN A1C
Est. average glucose Bld gHb Est-mCnc: 126 mg/dL
Hgb A1c MFr Bld: 6 % — ABNORMAL HIGH (ref 4.8–5.6)

## 2024-02-26 LAB — VITAMIN D 25 HYDROXY (VIT D DEFICIENCY, FRACTURES): Vit D, 25-Hydroxy: 38.7 ng/mL (ref 30.0–100.0)

## 2024-03-03 ENCOUNTER — Ambulatory Visit: Payer: Medicare PPO | Admitting: Family Medicine

## 2024-03-05 NOTE — Progress Notes (Unsigned)
   Established Patient Office Visit  Subjective   Patient ID: Kim Lane, female    DOB: 08-Oct-1934  Age: 89 y.o. MRN: [id number]  No chief complaint on file.   HPI  Non hodkins lymphoma -0 zometa infusions- once a year  HTN - carvedilol  Hld - rosuvastatin.  LDL was 169.  Is she actually taking it?   Hypothyroid -  The ASCVD Risk score (Arnett DK, et al., 2019) failed to calculate for the following reasons:   The 2019 ASCVD risk score is only valid for ages 36 to 12   Risk score cannot be calculated because patient has a medical history suggesting prior/existing ASCVD  Health Maintenance Due  Topic Date Due   Zoster Vaccines- Shingrix (1 of 2) 05/12/1953   Pneumonia Vaccine 80+ Years old (2 of 2 - PPSV23 or PCV20) 11/03/2016   COVID-19 Vaccine (3 - Pfizer risk series) 10/28/2022   Medicare Annual Wellness (AWV)  02/02/2024      Objective:     There were no vitals taken for this visit. {Vitals History (Optional):23777}  Physical Exam   No results found for any visits on 03/06/24.      Assessment & Plan:   There are no diagnoses linked to this encounter.   No follow-ups on file.    Sandre Kitty, MD

## 2024-03-06 ENCOUNTER — Ambulatory Visit: Admitting: Family Medicine

## 2024-03-06 ENCOUNTER — Encounter: Payer: Self-pay | Admitting: Family Medicine

## 2024-03-06 VITALS — BP 133/82 | HR 75 | Ht 61.0 in | Wt 127.1 lb

## 2024-03-06 DIAGNOSIS — E039 Hypothyroidism, unspecified: Secondary | ICD-10-CM | POA: Diagnosis not present

## 2024-03-06 DIAGNOSIS — M62838 Other muscle spasm: Secondary | ICD-10-CM | POA: Insufficient documentation

## 2024-03-06 DIAGNOSIS — E785 Hyperlipidemia, unspecified: Secondary | ICD-10-CM

## 2024-03-06 DIAGNOSIS — I10 Essential (primary) hypertension: Secondary | ICD-10-CM

## 2024-03-06 DIAGNOSIS — Z8673 Personal history of transient ischemic attack (TIA), and cerebral infarction without residual deficits: Secondary | ICD-10-CM

## 2024-03-06 MED ORDER — ROSUVASTATIN CALCIUM 5 MG PO TABS: 5.0000 mg | ORAL_TABLET | Freq: Every day | ORAL | 3 refills | Status: AC

## 2024-03-06 NOTE — Assessment & Plan Note (Signed)
 Recommended the patient try over-the-counter heat and over-the-counter topical medications.  No neuropathy symptoms.  Symptoms are mild.

## 2024-03-06 NOTE — Assessment & Plan Note (Signed)
 Patient unsure of her medications and how often she supposed to take them but her carvedilol is dosed as every other day.  This is going back several years.  From what I can tell this is only because she was on metoprolol every other day prior to switching to carvedilol and wanted to continue every other day.  Her blood pressures have been either normal or elevated.  There is no other indication to continue doing every other day.  Would recommend daily dosing to make things simpler for her.

## 2024-03-06 NOTE — Assessment & Plan Note (Addendum)
 Most recent TSH normal.  Continue daily 88 mcg.  Recheck TSH at next visit.  6 months from now.

## 2024-03-06 NOTE — Patient Instructions (Signed)
 It was nice to see you today,  We addressed the following topics today: -The medicines I have listed for you that you should be taking include: Carvedilol-this is for blood pressure Levothyroxine/thyroid-this is for low thyroid Rosuvastatin/Crestor.-This once for cholesterol  - For the pain in your neck you can use topical heating pads 20 minutes at a time multiple times a day.  You can also use topical over-the-counter medication such as Voltaren gel.  Have a great day,  Frederic Jericho, MD

## 2024-03-06 NOTE — Assessment & Plan Note (Signed)
 Patient thinks she is taking rosuvastatin but is unsure.  Only takes "2 medicines" daily.  Her fill history and her cholesterol levels would indicate she is not taking it.  I sent in a prescription for a new refill and advised her to take this daily.  Follow-up with lipid panel at next visit.  Should still be on this medication despite her age due to history of CVA seen on MRI.

## 2024-03-07 ENCOUNTER — Other Ambulatory Visit: Payer: Self-pay | Admitting: Family Medicine

## 2024-03-07 DIAGNOSIS — E039 Hypothyroidism, unspecified: Secondary | ICD-10-CM

## 2024-04-06 ENCOUNTER — Ambulatory Visit (INDEPENDENT_AMBULATORY_CARE_PROVIDER_SITE_OTHER): Payer: Medicare PPO

## 2024-04-06 DIAGNOSIS — Z0000 Encounter for general adult medical examination without abnormal findings: Secondary | ICD-10-CM | POA: Diagnosis not present

## 2024-04-06 NOTE — Patient Instructions (Signed)
 Ms. Freda , Thank you for taking time to come for your Medicare Wellness Visit. I appreciate your ongoing commitment to your health goals. Please review the following plan we discussed and let me know if I can assist you in the future.   Referrals/Orders/Follow-Ups/Clinician Recommendations: none  This is a list of the screening recommended for you and due dates:  Health Maintenance  Topic Date Due   Zoster (Shingles) Vaccine (1 of 2) 05/12/1955   Pneumonia Vaccine (2 of 2 - PPSV23) 11/03/2016   COVID-19 Vaccine (3 - Pfizer risk series) 10/28/2022   Flu Shot  07/21/2024   Medicare Annual Wellness Visit  04/06/2025   DTaP/Tdap/Td vaccine (3 - Td or Tdap) 10/28/2028   DEXA scan (bone density measurement)  Completed   HPV Vaccine  Aged Out   Meningitis B Vaccine  Aged Out    Advanced directives: (In Chart) A copy of your advanced directives are scanned into your chart should your provider ever need it.  Next Medicare Annual Wellness Visit scheduled for next year: Yes  insert Preventive Care attachment Insert FALL PREVENTION attachment if needed

## 2024-04-06 NOTE — Progress Notes (Signed)
 Subjective:   Kim Lane is a 89 y.o. who presents for a Medicare Wellness preventive visit.  Visit Complete: Virtual I connected with  Kim Lane on 04/06/24 by a audio enabled telemedicine application and verified that I am speaking with the correct person using two identifiers.  Patient Location: Home  Provider Location: Home Office  I discussed the limitations of evaluation and management by telemedicine. The patient expressed understanding and agreed to proceed.  Vital Signs: Because this visit was a virtual/telehealth visit, some criteria may be missing or patient reported. Any vitals not documented were not able to be obtained and vitals that have been documented are patient reported.  VideoError- Librarian, academic were attempted between this provider and patient, however failed, due to patient having technical difficulties OR patient did not have access to video capability.  We continued and completed visit with audio only.   Persons Participating in Visit: Patient.  AWV Questionnaire: No: Patient Medicare AWV questionnaire was not completed prior to this visit.  Cardiac Risk Factors include: advanced age (>35men, >66 women);dyslipidemia;hypertension     Objective:    Today's Vitals   There is no height or weight on file to calculate BMI.     04/06/2024   11:07 AM 02/01/2023   10:22 AM 12/04/2020   10:28 AM 05/06/2020   11:29 AM 01/05/2019    8:46 AM 10/28/2017   11:33 AM 07/07/2017   11:29 AM  Advanced Directives  Does Patient Have a Medical Advance Directive? Yes Yes Yes Yes Yes Yes Yes  Type of Estate agent of [place];Living will Living will Healthcare Power of [place];Living will Healthcare Power of [place];Living will Healthcare Power of [place];Living will Healthcare Power of [place];Living will Healthcare Power of [place];Living will  Does patient want to make changes to medical advance directive?   No - Patient declined  No - Patient declined No - Patient declined No - Patient declined   Copy of Healthcare Power of Attorney in Chart? Yes - validated most recent copy scanned in chart (See row information)  No - copy requested No - copy requested  Yes Yes    Current Medications (verified) Outpatient Encounter Medications as of 04/06/2024  Medication Sig   albuterol (PROAIR HFA) 108 (90 Base) MCG/ACT inhaler Inhale 2 puffs into the lungs every 4 (four) hours as needed for shortness of breath.   aspirin 81 MG tablet Take 81 mg by mouth every other day.   carvedilol (COREG) 3.125 MG tablet TAKE 1 TABLET BY MOUTH EVERY OTHER MORNING   Cholecalciferol (HM VITAMIN D3) 100 MCG (4000 UT) CAPS 4,000 IU QD   levothyroxine (SYNTHROID) 88 MCG tablet TAKE 1 TABLET BY MOUTH EVERY DAY   Melatonin 2.5 MG CAPS Take 1 capsule by mouth at bedtime.   meloxicam (MOBIC) 15 MG tablet Take 1 tablet (15 mg total) by mouth daily as needed for pain.   memantine (NAMENDA) 10 MG tablet Take 1 tablet (10 mg total) by mouth 2 (two) times daily.   Multiple Vitamins-Minerals (CENTRUM SILVER PO) Take 1 tablet by mouth daily.   nitroGLYCERIN (NITROSTAT) 0.4 MG SL tablet Place 1 tablet (0.4 mg total) under the tongue every 5 (five) minutes as needed for chest pain.   Omega-3 Fatty Acids (FISH OIL) 1000 MG CAPS Take 1 capsule by mouth daily.   RESTASIS 0.05 % ophthalmic emulsion Place 2 drops into both eyes 2 (two) times daily. For dry eyes   rosuvastatin (CRESTOR) 5  MG tablet Take 1 tablet (5 mg total) by mouth daily.   diclofenac Sodium (VOLTAREN) 1 % GEL Apply 2 g topically 4 (four) times daily. To affected joint for pain prn (Patient not taking: Reported on 04/06/2024)   [DISCONTINUED] levothyroxine (SYNTHROID) 112 MCG tablet TAKE 1 TABLET BY MOUTH DAILY (Patient not taking: Reported on 04/22/2022)   No facility-administered encounter medications on file as of 04/06/2024.    Allergies (verified) Fosamax [alendronate  sodium]   History: Past Medical History:  Diagnosis Date   Anemia    Anginal pain (HCC)    occ   Arthritis    Blood dyscrasia 12/2016   hodgins lymphoma tx-remission   CAD (coronary artery disease) 02/2006   Taxus stent placed to LAD and Diagonal per Dr. Grandville Lax   Cataracts, bilateral    Complication of anesthesia    HTN (hypertension)    Hypercholesterolemia    Hypothyroidism    IBS (irritable bowel syndrome)    Osteoarthritis    PONV (postoperative nausea and vomiting)    Stroke (HCC) 03/2016   Past Surgical History:  Procedure Laterality Date   APPENDECTOMY     IR GENERIC HISTORICAL  01/25/2017   IR US  GUIDE VASC ACCESS RIGHT 01/25/2017 Erica Hau, MD [facility]   IR GENERIC HISTORICAL  01/25/2017   IR FLUORO GUIDE PORT INSERTION RIGHT 01/25/2017 Erica Hau, MD [facility]   IR REMOVAL TUN ACCESS W/ PORT W/O FL MOD SED  10/28/2017   KYPHOPLASTY N/A 06/24/2017   Procedure: KYPHOPLASTY T10;  Surgeon: Mort Ards, MD;  Location: [facility] OR;  Service: Orthopedics;  Laterality: N/A;  90 mins   LEFT HEART CATH AND CORONARY ANGIOGRAPHY N/A 06/16/2017   Procedure: Left Heart Cath and Coronary Angiography;  Surgeon: Arnoldo Lapping, MD;  Location: [facility] INVASIVE CV LAB;  Service: Cardiovascular;  Laterality: N/A;   MASS EXCISION Right 12/22/2016   Procedure: RIGHT NECK LYMPH NODE BIOPSY;  Surgeon: Prescott Brodie, MD;  Location: [facility];  Service: ENT;  Laterality: Right;   SPINE SURGERY     stents     in heart   TONSILLECTOMY     TOTAL KNEE ARTHROPLASTY Bilateral 2011,2012   VAGINAL HYSTERECTOMY     Family History  Problem Relation Age of Onset   Diabetes Mother    Dementia Mother    ALS Mother    Heart Problems Father    Liver disease Sister    Cirrhosis Sister    Diabetes Sister    Dementia Sister    Other Brother        GOOD HEALTH   Other Daughter        GOOD HEALTH   Bone cancer Son    Heart block Son        CABG   Heart block Other         CABG   Breast cancer Neg Hx    Social History   Socioeconomic History   Marital status: Widowed    Spouse name: Not on file   Number of children: 2   Years of education: 12   Highest education level: Not on file  Occupational History    Employer: [employer]    CommentArboriculturist, retired  Tobacco Use   Smoking status: Former    Passive exposure: Never   Smokeless tobacco: Never   Tobacco comments:    quit smoking 30 years ago, very light smoker  Vaping Use   Vaping status: Never Used  Substance and Sexual Activity   Alcohol use: No    Alcohol/week: 0.0 standard drinks of alcohol   Drug use: No   Sexual activity: Not on file  Other Topics Concern   Not on file  Social History Narrative   08/29/19 Lives alone   caffeine use- coffee- 5 cups daily   Social Drivers of Health   Financial Resource Strain: Low Risk  (04/06/2024)   Overall Financial Resource Strain (CARDIA)    Difficulty of Paying Living Expenses: Not hard at all  Food Insecurity: No Food Insecurity (04/06/2024)   Hunger Vital Sign    Worried About Running Out of Food in the Last Year: Never true    Ran Out of Food in the Last Year: Never true  Transportation Needs: No Transportation Needs (04/06/2024)   PRAPARE - Administrator, Civil Service (Medical): No    Lack of Transportation (Non-Medical): No  Physical Activity: Inactive (04/06/2024)   Exercise Vital Sign    Days of Exercise per Week: 0 days    Minutes of Exercise per Session: 0 min  Stress: No Stress Concern Present (04/06/2024)   [employer] of Occupational Health - Occupational Stress Questionnaire    Feeling of Stress : Not at all  Social Connections: Moderately Integrated (04/06/2024)   Social Connection and Isolation Panel [NHANES]    Frequency of Communication with Friends and Family: More than three times a week    Frequency of Social Gatherings with Friends and Family: Once a week    Attends Religious Services:  More than 4 times per year    Active Member of [employer] or Organizations: Yes    Attends Banker Meetings: More than 4 times per year    Marital Status: Widowed    Tobacco Counseling Counseling given: Not Answered Tobacco comments: quit smoking 30 years ago, very light smoker    Clinical Intake:  Pre-visit preparation completed: Yes  Pain : No/denies pain     Nutritional Risks: None Diabetes: No  Lab Results  Component Value Date   HGBA1C 6.0 (H) 02/25/2024   HGBA1C 6.1 (H) 10/28/2023   HGBA1C 5.9 (H) 02/01/2023     How often do you need to have someone help you when you read instructions, pamphlets, or other written materials from your doctor or pharmacy?: 1 - Never  Interpreter Needed?: No  Information entered by :: NAllen LPN   Activities of Daily Living     04/06/2024   10:57 AM  In your present state of health, do you have any difficulty performing the following activities:  Hearing? 1  Comment has hearing aids  Vision? 0  Difficulty concentrating or making decisions? 0  Walking or climbing stairs? 0  Dressing or bathing? 0  Doing errands, shopping? 0  Preparing Food and eating ? N  Using the Toilet? N  In the past six months, have you accidently leaked urine? Y  Do you have problems with loss of bowel control? N  Managing your Medications? N  Managing your Finances? N  Housekeeping or managing your Housekeeping? N    Patient Care Team: Noreene Bearded, PA as PCP - General (Family Medicine) Loyde Rule, MD as PCP - Cardiology (Cardiology) Loyde Rule, MD as Consulting Physician (Cardiology) Omega Bible, MD as Consulting Physician (Neurology) Harlen Lick, MD as Consulting Physician (Dermatology) Prescott Brodie, MD (Inactive) as Consulting Physician (Otolaryngology) Frankie Israel, MD as Consulting Physician (Hematology) Mort Ards, MD as Consulting  Physician (Orthopedic Surgery) Sheral Apley, MD as  Attending Physician (Orthopedic Surgery)  Indicate any recent Medical Services you may have received from other than [facility] providers in the past year (date may be approximate).     Assessment:   This is a routine wellness examination for [place].  Hearing/Vision screen Hearing Screening - Comments:: Has hearing aids that are maintained Vision Screening - Comments:: Regular eye exams, in [facility]   Goals Addressed             This Visit's Progress    Patient Stated       04/06/2024, wants to start riding stationary bike       Depression Screen     04/06/2024   11:08 AM 03/06/2024    9:41 AM 11/04/2023   11:26 AM 05/05/2023    1:54 PM 02/01/2023   11:25 AM 07/23/2022    9:30 AM 04/17/2022   10:36 AM  PHQ 2/9 Scores  PHQ - 2 Score 0 0 1 1 0 1 1  PHQ- 9 Score 3 3 4 6 7 6 8     Fall Risk     04/06/2024   11:08 AM 09/02/2023   10:01 AM 08/05/2023   10:39 AM 02/01/2023   11:26 AM 02/01/2023   10:25 AM  Fall Risk   Falls in the past year? 1 1 1 1 1   Comment tripped      Number falls in past yr: 1 1 1 1 1   Injury with Fall? 0 1 1 1 1   Risk for fall due to : Medication side effect;Impaired mobility;Impaired balance/gait;History of fall(s) Impaired balance/gait;Impaired mobility;History of fall(s) Impaired balance/gait;History of fall(s)    Follow up Falls evaluation completed;Falls prevention discussed Falls evaluation completed Falls evaluation completed      MEDICARE RISK AT HOME:  Medicare Risk at Home Any stairs in or around the home?: Yes If so, are there any without handrails?: No Home free of loose throw rugs in walkways, pet beds, electrical cords, etc?: Yes Adequate lighting in your home to reduce risk of falls?: Yes Life alert?: No Use of a cane, walker or w/c?: Yes Grab bars in the bathroom?: Yes Shower chair or bench in shower?: No Elevated toilet seat or a handicapped toilet?: Yes  TIMED UP AND GO:  Was the test performed?  No  Cognitive Function: 6CIT  completed    09/03/2022    3:28 PM  MMSE - Mini Mental State Exam  Orientation to time 5  Orientation to Place 5  Registration 3  Attention/ Calculation 5  Recall 2  Language- name 2 objects 2  Language- repeat 1  Language- follow 3 step command 3  Language- read & follow direction 1  Write a sentence 1  Copy design 1  Total score 29        04/06/2024   11:10 AM 02/01/2023   10:20 AM 07/23/2022    9:27 AM 10/17/2021   10:12 AM 10/17/2020    9:10 AM  6CIT Screen  What Year? 0 points 0 points 0 points 0 points 0 points  What month? 0 points 0 points 0 points 0 points 0 points  What time? 0 points 0 points 0 points 0 points 0 points  Count back from 20 0 points 0 points 0 points 0 points 0 points  Months in reverse 0 points 0 points 0 points 0 points 0 points  Repeat phrase 0 points 0 points 0 points 0 points 0 points  Total Score 0 points 0 points 0 points 0 points 0 points    Immunizations Immunization History  Administered Date(s) Administered   Fluad Trivalent(High Dose 65+) 11/04/2023   Influenza, Quadrivalent, Recombinant, Inj, Pf 09/30/2022   Influenza-Unspecified 09/20/2013, 09/19/2014, 09/21/2015, 09/22/2017   Moderna SARS-COV2 Booster Vaccination 09/30/2022   PFIZER(Purple Top)SARS-COV-2 Vaccination 01/26/2020, 02/20/2020   Pneumococcal Conjugate-13 09/08/2016   Pneumococcal-Unspecified 12/30/2010   Td 12/01/2007   Tdap 10/28/2018   Zoster, Live 11/30/2008    Screening Tests Health Maintenance  Topic Date Due   Zoster Vaccines- Shingrix (1 of 2) 05/12/1953   Pneumonia Vaccine 56+ Years old (2 of 2 - PPSV23) 11/03/2016   COVID-19 Vaccine (3 - Pfizer risk series) 10/28/2022   INFLUENZA VACCINE  07/21/2024   Medicare Annual Wellness (AWV)  04/06/2025   DTaP/Tdap/Td (3 - Td or Tdap) 10/28/2028   DEXA SCAN  Completed   HPV VACCINES  Aged Out   Meningococcal B Vaccine  Aged Out    Health Maintenance  Health Maintenance Due  Topic Date Due   Zoster  Vaccines- Shingrix (1 of 2) 05[birth year]12/1953   Pneumonia Vaccine 48+ Years old (2 of 2 - PPSV23) 11/03/2016   COVID-19 Vaccine (3 - Pfizer risk series) 10/28/2022   Health Maintenance Items Addressed: Due for shingles, covid, and pneumonia  Additional Screening:  Vision Screening: Recommended annual ophthalmology exams for early detection of glaucoma and other disorders of the eye.  Dental Screening: Recommended annual dental exams for proper oral hygiene  Community Resource Referral / Chronic Care Management: CRR required this visit?  No   CCM required this visit?  No     Plan:     I have personally reviewed and noted the following in the patient's chart:   Medical and social history Use of alcohol, tobacco or illicit drugs  Current medications and supplements including opioid prescriptions. Patient is not currently taking opioid prescriptions. Functional ability and status Nutritional status Physical activity Advanced directives List of other physicians Hospitalizations, surgeries, and ER visits in previous 12 months Vitals Screenings to include cognitive, depression, and falls Referrals and appointments  In addition, I have reviewed and discussed with patient certain preventive protocols, quality metrics, and best practice recommendations. A written personalized care plan for preventive services as well as general preventive health recommendations were provided to patient.     Areatha Beecham, LPN   1/61/0960   After Visit Summary: (Pick Up) Due to this being a telephonic visit, with patients personalized plan was offered to patient and patient has requested to Pick up at office.  Notes: Nothing significant to report at this time.

## 2024-04-28 ENCOUNTER — Ambulatory Visit: Payer: Self-pay

## 2024-04-28 NOTE — Telephone Encounter (Signed)
  Chief Complaint: leg pain Symptoms: bilateral leg pain up to knees. Numbness in legs Frequency: patient states pain had been going on for awhile but was the worst two nights ago Pertinent Negatives: Patient denies fever, CP, SOB, swelling Disposition: [] ED /[] Urgent Care (no appt availability in office) / [] Appointment(In office/virtual)/ []  [facility]/ [] Home Care/ [] Refused Recommended Disposition /[] [facility]/ [x]  Follow-up with PCP Additional Notes: patient called with complaints of bilateral leg pain that goes to her knees. Endorses numbness to legs. Patient denies fever, CP, SOB, or swelling. Patient states pain was the worst two nights ago but that she slept okay last night. Per protocol, patient is recommended to be seen within three days. Unable to schedule due to availability in the clinic. Patient will need a call from clinic to schedule an appointment. Patient verbalized understanding of the plan and all questions answered.    Copied from CRM [id number]. Topic: Clinical - Red Word Triage >> Apr 28, 2024  9:30 AM Kim Lane wrote: Red Word that prompted transfer to Nurse Triage: Legs were hurting severely all night and could not sleep, suspects some sort of neuropathy, numbness in legs and has been having to walk around while holding on to furniture to keep balance/ "worst night ever" Reason for Disposition  [1] MODERATE pain (e.g., interferes with normal activities, limping) AND [2] present > 3 days  Answer Assessment - Initial Assessment Questions 1. ONSET: "When did the pain start?"      Pain was the worse two night ago 2. LOCATION: "Where is the pain located?"      Bilateral legs up to knees 3. PAIN: "How bad is the pain?"    (Scale 1-10; or mild, moderate, severe)   -  MILD (1-3): doesn't interfere with normal activities    -  MODERATE (4-7): interferes with normal activities (e.g., work or school) or awakens from sleep, limping    -  SEVERE (8-10):  excruciating pain, unable to do any normal activities, unable to walk     No pain currently 4. WORK OR EXERCISE: "Has there been any recent work or exercise that involved this part of the body?"      no 5. CAUSE: "What do you think is causing the leg pain?"     Patient is concerned for possible neuropathy 6. OTHER SYMPTOMS: "Do you have any other symptoms?" (e.g., chest pain, back pain, breathing difficulty, swelling, rash, fever, numbness, weakness)     no  Protocols used: Leg Pain-A-AH

## 2024-04-28 NOTE — Telephone Encounter (Signed)
 Copied from CRM [id number]. Topic: Appointments - Appointment Scheduling >> Apr 28, 2024  3:16 PM Hobson Luna F wrote: Patient is calling in to schedule an appointment and would like for someone to call her Monday morning so she can get the appointment scheduled. Schedule is showing earliest appointment is July.

## 2024-04-28 NOTE — Telephone Encounter (Signed)
 Pt is scheduled with you on 5/13  @4 :10pm

## 2024-04-28 NOTE — Telephone Encounter (Signed)
 Appt has been scheduled.

## 2024-05-02 ENCOUNTER — Ambulatory Visit (INDEPENDENT_AMBULATORY_CARE_PROVIDER_SITE_OTHER): Admitting: Family Medicine

## 2024-05-02 ENCOUNTER — Ambulatory Visit: Admitting: Family Medicine

## 2024-05-02 ENCOUNTER — Encounter: Payer: Self-pay | Admitting: Family Medicine

## 2024-05-02 VITALS — BP 147/76 | HR 68 | Ht 61.0 in | Wt 129.0 lb

## 2024-05-02 DIAGNOSIS — G5603 Carpal tunnel syndrome, bilateral upper limbs: Secondary | ICD-10-CM

## 2024-05-02 DIAGNOSIS — G5600 Carpal tunnel syndrome, unspecified upper limb: Secondary | ICD-10-CM | POA: Insufficient documentation

## 2024-05-02 DIAGNOSIS — I83813 Varicose veins of bilateral lower extremities with pain: Secondary | ICD-10-CM | POA: Insufficient documentation

## 2024-05-02 MED ORDER — MAGNESIUM OXIDE 400 MG PO TABS: 400.0000 mg | ORAL_TABLET | Freq: Every day | ORAL | 2 refills | Status: AC

## 2024-05-02 NOTE — Progress Notes (Signed)
   Acute Office Visit  Subjective:     Patient ID: Kim Lane, female    DOB: 1934/06/13, 89 y.o.   MRN: [id number]  Chief Complaint  Patient presents with   Leg Pain    HPI Subjective - Leg pain, bilateral, from feet to knees, throbbing in nature - Worst episode last week with pain all night, unable to sleep - Symptoms present for 2-3 months, typically milder - Pain primarily nocturnal, occasionally present during day but less severe - Prior episodes resolved with walking around bed - No leg swelling - No muscle cramping sensation - Reports increased unsteadiness over past 2 months - Occasional hand numbness at night  ROS Positive for hand numbness at night. Negative for leg swelling, muscle cramping.  ROS      Objective:    BP (!) 147/76 (BP Location: Left Arm, Patient Position: Sitting, Cuff Size: Normal)   Pulse 68   Ht 5\' 1"  (1.549 m)   Wt 129 lb (58.5 kg)   SpO2 98%   BMI 24.37 kg/m    Physical Exam General: Alert, oriented Pulmonary: No respiratory distress Skin: Significant purple varicose veins bilaterally in the lower extremities.  No results found for any visits on 05/02/24.      Assessment & Plan:   Varicose veins of both lower extremities with pain Assessment & Plan: Bilateral and nocturnal episodes.  Most significant 1 was last week.  On exam she has multiple superficial visible veins bilaterally.  Claudication less likely due to improvement with walking.  Description of symptoms not consistent with neuropathy.  No concern for DVT based on exam findings and her complaint of being bilateral.  Possibly nocturnal muscle cramping, prescribed magnesium oxide at night.  Most likely still related to a venous issue.  Sending in referral to vein and vascular clinic.  Orders: -     Ambulatory referral to Vascular Surgery  Bilateral carpal tunnel syndrome Assessment & Plan: Recommended patient use nocturnal wrist splints.  She has used these in the  past but does not know where her previous splints are at this time.  Advised her she can purchase over-the-counter wrist splints.   Other orders -     Magnesium Oxide; Take 1 tablet (400 mg total) by mouth daily.  Dispense: 30 tablet; Refill: 2     Return if symptoms worsen or fail to improve.  Laneta Pintos, MD

## 2024-05-02 NOTE — Assessment & Plan Note (Addendum)
 Bilateral and nocturnal episodes.  Most significant 1 was last week.  On exam she has multiple superficial visible veins bilaterally.  Claudication less likely due to improvement with walking.  Description of symptoms not consistent with neuropathy.  No concern for DVT based on exam findings and her complaint of being bilateral.  Possibly nocturnal muscle cramping, prescribed magnesium oxide at night.  Most likely still related to a venous issue.  Sending in referral to vein and vascular clinic.

## 2024-05-02 NOTE — Patient Instructions (Addendum)
 It was nice to see you today,  We addressed the following topics today: --I am sending in a referral to the vein and vascular specialist.  If you have not heard from somebody in 2 weeks let us  know - I am sending in a prescription for magnesium oxide 400 mg for you to try taking at night.  This may help with muscle cramps. - It is also available over-the-counter in case your insurance does not pay for it - Nocturnal wrist splints for carpal tunnel syndrome will help with your wrist symptoms.  These are available over-the-counter.  No prescription necessary.  Have a great day,  Etha Henle, MD

## 2024-05-02 NOTE — Assessment & Plan Note (Signed)
 Recommended patient use nocturnal wrist splints.  She has used these in the past but does not know where her previous splints are at this time.  Advised her she can purchase over-the-counter wrist splints.

## 2024-06-07 DIAGNOSIS — L509 Urticaria, unspecified: Secondary | ICD-10-CM | POA: Diagnosis not present

## 2024-06-07 DIAGNOSIS — L299 Pruritus, unspecified: Secondary | ICD-10-CM | POA: Diagnosis not present

## 2024-06-07 DIAGNOSIS — T7809XA Anaphylactic reaction due to other food products, initial encounter: Secondary | ICD-10-CM | POA: Diagnosis not present

## 2024-06-07 DIAGNOSIS — T7840XA Allergy, unspecified, initial encounter: Secondary | ICD-10-CM | POA: Diagnosis not present

## 2024-06-07 DIAGNOSIS — T782XXA Anaphylactic shock, unspecified, initial encounter: Secondary | ICD-10-CM | POA: Diagnosis not present

## 2024-06-07 DIAGNOSIS — I10 Essential (primary) hypertension: Secondary | ICD-10-CM | POA: Diagnosis not present

## 2024-06-07 DIAGNOSIS — L500 Allergic urticaria: Secondary | ICD-10-CM | POA: Diagnosis not present

## 2024-06-07 DIAGNOSIS — R0789 Other chest pain: Secondary | ICD-10-CM | POA: Diagnosis not present

## 2024-06-12 ENCOUNTER — Ambulatory Visit

## 2024-06-12 ENCOUNTER — Ambulatory Visit: Payer: Self-pay

## 2024-06-12 VITALS — BP 121/74 | HR 83 | Ht 61.0 in | Wt 127.0 lb

## 2024-06-12 DIAGNOSIS — R0789 Other chest pain: Secondary | ICD-10-CM | POA: Diagnosis not present

## 2024-06-12 MED ORDER — METHOCARBAMOL 500 MG PO TABS: 250.0000 mg | ORAL_TABLET | Freq: Three times a day (TID) | ORAL | 0 refills | Status: DC | PRN

## 2024-06-12 NOTE — Progress Notes (Signed)
 Established Patient Office Visit  Subjective   Patient ID: [place], female    DOB: Jul 14, 1934  Age: 89 y.o. MRN: [id number]  Chief Complaint  Patient presents with   Chest Pain    Onset: late Saturday evening Meds taken:     HPI  [place] is a 89 y.o. y/o female who presents to the clinic today for a 3 to 4-day history of chest pain. Patient reports that last Sunday (1 week ago) she tripped and fell, and the left side of her chest hit the leg of a chair, just under her left breast.  She reports that initially she had no pain in that area, however over the last several days she has began to experience sharp chest pain that is worse with inspiration.  She describes the pain as a sharp, catching pain that is worse when she takes a deep breath.  She has taken a few doses of Tylenol  that relieved the pain.  She denies any radiation of the chest pain to the arm or back.  Denies any shortness of breath or palpitations.  Of note, patient was seen in the ER on 06/07/2024 for an allergic reaction.  At that time a chest x-ray was performed that did not show any signs of pneumothorax, hemothorax, lung damage, rib fractures.    ROS Per HPI.    Objective:     BP 121/74   Pulse 83   Ht 5' 1 (1.549 m)   Wt 127 lb 0.6 oz (57.6 kg)   SpO2 97%   BMI 24.00 kg/m    Physical Exam Constitutional:      General: She is not in acute distress.    Appearance: Normal appearance.   Cardiovascular:     Rate and Rhythm: Normal rate and regular rhythm.     Heart sounds: Normal heart sounds. No murmur heard.    No friction rub. No gallop.  Pulmonary:     Effort: Pulmonary effort is normal. No respiratory distress.     Breath sounds: Normal breath sounds.  Chest:     Comments: Bruising and tenderness to palpation over the xiphoid and over the ribs underlying her left breast.  Musculoskeletal:        General: No swelling.   Skin:    General: Skin is warm and dry.    Neurological:     General: No focal deficit present.     Mental Status: She is alert.   Psychiatric:        Mood and Affect: Mood normal.        Behavior: Behavior normal.        Thought Content: Thought content normal.    No results found for any visits on 06/12/24.    The ASCVD Risk score (Arnett DK, et al., 2019) failed to calculate for the following reasons:   The 2019 ASCVD risk score is only valid for ages 71 to 53   Risk score cannot be calculated because patient has a medical history suggesting prior/existing ASCVD    Assessment & Plan:   Chest discomfort Assessment & Plan: Chest x-ray on Wednesday ruled out pneumothorax, hemothorax, pulmonary contusion, and rib fractures.  Given that the pain relieved with Tylenol  and is associated with inspiration, suspect rib contusion of the local soft tissue and periosteum secondary to the fall she sustained last Sunday.  EKG in office today showed normal sinus rhythm with no other abnormalities.  Advised patient to start taking Tylenol  1000  mg 3 times a day for up to 2 weeks to stay on top of her chest wall pain.  Also prescribed a low-dose of methocarbamol  for her to take as needed.  Advised the patient to take half a tablet or 250 mg, and if needed she can repeat the dose for a total of 500 mg within an 8-hour period.  Advised patient that if she begins to experience worsening chest pain or shortness of breath to go straight to the ER for further evaluation.  Otherwise, follow-up and let us  know if the pain is not improving after treatment.  Orders: -     EKG 12-Lead  Other orders -     Methocarbamol ; Take 0.5 tablets (250 mg total) by mouth every 8 (eight) hours as needed for muscle spasms.  Dispense: 15 tablet; Refill: 0    Return if symptoms worsen or fail to improve.    Kim JULIANNA Sacks, PA-C

## 2024-06-12 NOTE — Patient Instructions (Signed)
 It was nice to see you today!  As we discussed in clinic:   -Your chest pain is localized underneath the left breast and aligns well with the bruising you had from the recent fall. It is tender to touch and gets worse with breathing in. These characteristics are reassuring that the chest pain is musculoskeletal in nature rather than heart related.  -We got an EKG on you today in the office just to be safe, which looked good! -I recommend taking Tylenol  (acetaminophen ) 1000 mg three times daily for 2 weeks to stay on top of the pain.  -I have also sent in a low dose muscle relaxer that you can take at night to help with pain. The instructions on the bottle say to take half a tablet (250 mg) every 8 hours as needed. If pain persists after 1 dose, you can take another half tablet for a total of 500 mg within an 8 hour period.   If chest pain worsens or you begin to feel short of breath, please go to the ER for further evaluation. It was nice to meet you!   If you have any problems before your next visit feel free to message me via MyChart (minor issues or questions) or call the office, otherwise you may reach out to schedule an office visit.  Thank you! Saddie Sacks, PA-C

## 2024-06-12 NOTE — Telephone Encounter (Signed)
 FYI Only or Action Required?: FYI only for provider.  Patient was last seen in primary care on 05/02/2024 by Chandra Toribio POUR, MD. Called Nurse Triage reporting Chest Pain. Symptoms began yesterday. Interventions attempted: Nothing. Symptoms are: unchanged.  Triage Disposition: See Physician Within 24 Hours  Patient/caregiver understands and will follow disposition?: Yes  CAL, Rosina was able to schedule pt for appt today 310 with Dr. Chandra. Pt went to ED while on vacation for rash and will bring AVS with her to appt.   Copied from CRM [id number]. Topic: Clinical - Red Word Triage >> Jun 12, 2024  9:29 AM Ernestene P wrote: Red Word that prompted transfer to Nurse Triage: chest pain Reason for Disposition  [1] Chest pain lasts > 5 minutes AND [2] occurred > 3 days ago (72 hours) AND [3] NO chest pain or cardiac symptoms now  Answer Assessment - Initial Assessment Questions 1. LOCATION: Where does it hurt?       In middle of chest  2. RADIATION: Does the pain go anywhere else? (e.g., into neck, jaw, arms, back)     no 3. ONSET: When did the chest pain begin? (Minutes, hours or days)      Yesterday  4. PATTERN: Does the pain come and go, or has it been constant since it started?  Does it get worse with exertion?      Constant but pain worsens at time  6. SEVERITY: How bad is the pain?  (e.g., Scale 1-10; mild, moderate, or severe)    - MILD (1-3): doesn't interfere with normal activities     - MODERATE (4-7): interferes with normal activities or awakens from sleep    - SEVERE (8-10): excruciating pain, unable to do any normal activities       Not bad right now  7. CARDIAC RISK FACTORS: Do you have any history of heart problems or risk factors for heart disease? (e.g., angina, prior heart attack; diabetes, high blood pressure, high cholesterol, smoker, or strong family history of heart disease)     no 10. OTHER SYMPTOMS: Do you have any other symptoms? (e.g., dizziness, nausea,  vomiting, sweating, fever, difficulty breathing, cough)       no  Had been out of town all week with family. Went to ED while gone d/t rash  Protocols used: Chest Pain-A-AH

## 2024-06-12 NOTE — Assessment & Plan Note (Signed)
 Chest x-ray on Wednesday ruled out pneumothorax, hemothorax, pulmonary contusion, and rib fractures.  Given that the pain relieved with Tylenol  and is associated with inspiration, suspect rib contusion of the local soft tissue and periosteum secondary to the fall she sustained last Sunday.  EKG in office today showed normal sinus rhythm with no other abnormalities.  Advised patient to start taking Tylenol  1000 mg 3 times a day for up to 2 weeks to stay on top of her chest wall pain.  Also prescribed a low-dose of methocarbamol  for her to take as needed.  Advised the patient to take half a tablet or 250 mg, and if needed she can repeat the dose for a total of 500 mg within an 8-hour period.  Advised patient that if she begins to experience worsening chest pain or shortness of breath to go straight to the ER for further evaluation.  Otherwise, follow-up and let us  know if the pain is not improving after treatment.

## 2024-06-22 ENCOUNTER — Other Ambulatory Visit: Payer: Self-pay | Admitting: Family Medicine

## 2024-06-22 MED ORDER — METHOCARBAMOL 500 MG PO TABS: 250.0000 mg | ORAL_TABLET | Freq: Three times a day (TID) | ORAL | 0 refills | Status: AC | PRN

## 2024-06-22 NOTE — Telephone Encounter (Unsigned)
 Copied from CRM [id number]. Topic: Clinical - Medication Refill >> Jun 22, 2024  8:53 AM Antwanette L wrote: Medication: methocarbamol  (ROBAXIN ) 500 MG tablet   Has the patient contacted their pharmacy? No   This is the patient's preferred pharmacy:  [facility] - [place], [place] - [address] [place] [address] Phone: [phone] Fax: [phone]  Is this the correct pharmacy for this prescription? Yes    Has the prescription been filled recently? Yes. Last refilled on 06/12/24  Is the patient out of the medication? No. Patient will not have enough to last her through the weekend.  Has the patient been seen for an appointment in the last year OR does the patient have an upcoming appointment? Yes. Last ov with Saddie Sacks PA-C was on 06/12/24  Can we respond through MyChart? No. Contact the patient by phone at [phone]  Agent: Please be advised that Rx refills may take up to 3 business days. We ask that you follow-up with your pharmacy.

## 2024-06-29 ENCOUNTER — Encounter (INDEPENDENT_AMBULATORY_CARE_PROVIDER_SITE_OTHER)

## 2024-06-29 ENCOUNTER — Encounter (INDEPENDENT_AMBULATORY_CARE_PROVIDER_SITE_OTHER): Admitting: Vascular Surgery

## 2024-08-31 ENCOUNTER — Ambulatory Visit: Admitting: Family Medicine

## 2024-08-31 ENCOUNTER — Encounter: Payer: Self-pay | Admitting: Family Medicine

## 2024-08-31 VITALS — BP 118/73 | HR 71 | Ht 61.0 in | Wt 116.0 lb

## 2024-08-31 DIAGNOSIS — R4589 Other symptoms and signs involving emotional state: Secondary | ICD-10-CM | POA: Diagnosis not present

## 2024-08-31 DIAGNOSIS — R5383 Other fatigue: Secondary | ICD-10-CM | POA: Diagnosis not present

## 2024-08-31 NOTE — Assessment & Plan Note (Signed)
 Patient presents with low energy and lack of motivation for 1-2 weeks in the context of significant psychosocial stressors. These include grieving the death of her son 1.5 years ago, her daughter recently moving away, and her sister's declining health from cancer. Denies fevers, myalgias, or cough. No history of seeing a therapist or taking medications for mood. - Recommended counseling/therapy. Advised to call her insurance [facility]) for a list of covered therapists in the area. Provided a website to research therapists. - Encouraged in-person therapy sessions to help get out of the house. - Discussed the importance of social engagement and activities outside the home. - Plan to check labs, including thyroid  and iron  levels, to rule out organic causes of fatigue. Patient to schedule a lab visit.

## 2024-08-31 NOTE — Progress Notes (Unsigned)
   Established Patient Office Visit  Subjective   Patient ID: [place], female    DOB: Dec 02, 1934  Age: 89 y.o. MRN: [id number]  Chief Complaint  Patient presents with   Medical Management of Chronic Issues    HPI  Subjective - Reports low energy for the past 1-2 weeks. Finds self sitting and watching TV instead of being productive. Denies fevers, muscle aches, or cough. - Reports significant life stressors including the death of her son 1.5 years ago, which is still very difficult. Her daughter recently moved away to [place], leading to feelings of being upset and lonely. Her sister has cancer and is declining. - Lives alone. Reports having friends and social activities on the first three Tuesdays of the month.  Medications Reports no changes to current medications. Not taking any medications for mood.  PMH, PSH, FH, Social Hx PMHx: History of losing two husbands. FHx: Sister has cancer and is in failing health. Social Hx: Widowed. Lives alone. Son deceased 1.5 years ago. Daughter recently moved to [place]. Active with friends for lunch gatherings three times a month.  ROS Constitutional: Positive for fatigue. Negative for fever. Psychiatric: Reports low mood, feelings of loneliness, grief.  The ASCVD Risk score (Arnett DK, et al., 2019) failed to calculate for the following reasons:   The 2019 ASCVD risk score is only valid for ages 63 to 53   Risk score cannot be calculated because patient has a medical history suggesting prior/existing ASCVD  Health Maintenance Due  Topic Date Due   Zoster Vaccines- Shingrix (1 of 2) 05/12/1953   Pneumococcal Vaccine: 50+ Years (2 of 2 - PPSV23, PCV20, or PCV21) 11/03/2016   COVID-19 Vaccine (3 - Pfizer risk series) 10/28/2022   Influenza Vaccine  07/21/2024      Objective:     BP 118/73   Pulse 71   Ht 5' 1 (1.549 m)   Wt 116 lb (52.6 kg)   SpO2 96%   BMI 21.92 kg/m  {Vitals History (Optional):23777}  Physical  Exam General: Alert, oriented CV: Regular rate rhythm no murmurs Pulm: Lungs clear no wheezes or crackles.   No results found for any visits on 08/31/24.      Assessment & Plan:   Depressed mood Assessment & Plan: Patient presents with low energy and lack of motivation for 1-2 weeks in the context of significant psychosocial stressors. These include grieving the death of her son 1.5 years ago, her daughter recently moving away, and her sister's declining health from cancer. Denies fevers, myalgias, or cough. No history of seeing a therapist or taking medications for mood. - Recommended counseling/therapy. Advised to call her insurance [place]) for a list of covered therapists in the area. Provided a website to research therapists. - Encouraged in-person therapy sessions to help get out of the house. - Discussed the importance of social engagement and activities outside the home. - Plan to check labs, including thyroid  and iron  levels, to rule out organic causes of fatigue. Patient to schedule a lab visit.      Return in about 4 months (around 12/31/2024) for mood,  - kara is her pcp now so she can f/u with her.    Toribio MARLA Slain, MD

## 2024-08-31 NOTE — Patient Instructions (Addendum)
 It was nice to see you today,  We addressed the following topics today: -I think your lack of energy is mostly due to your worsening mood related to the recent death of your son and your daughter moving farther away. - I would like you to call your insurance company [facility] and ask them if they can provide you a list of therapists who are covered by your insurance. - You can then look up those therapists name on the website [email or web address]. - Getting out, increasing your social activity, and talking to a therapist will help you improve your mood.  If this is not helping then we can discuss medications to try at your next visit.  Have a great day,  Rolan Slain, MD

## 2024-09-20 ENCOUNTER — Other Ambulatory Visit

## 2024-09-20 DIAGNOSIS — R5383 Other fatigue: Secondary | ICD-10-CM | POA: Diagnosis not present

## 2024-09-20 DIAGNOSIS — E785 Hyperlipidemia, unspecified: Secondary | ICD-10-CM | POA: Diagnosis not present

## 2024-09-20 DIAGNOSIS — Z8673 Personal history of transient ischemic attack (TIA), and cerebral infarction without residual deficits: Secondary | ICD-10-CM

## 2024-09-21 LAB — LIPID PANEL
Chol/HDL Ratio: 2.9 ratio (ref 0.0–4.4)
Cholesterol, Total: 176 mg/dL (ref 100–199)
HDL: 60 mg/dL (ref >39)
LDL Chol Calc (NIH): 99 mg/dL (ref 0–99)
Triglycerides: 91 mg/dL (ref 0–149)
VLDL Cholesterol Cal: 17 mg/dL (ref 5–40)

## 2024-09-21 LAB — IRON,TIBC AND FERRITIN PANEL
Ferritin: 160 ng/mL — ABNORMAL HIGH (ref 15–150)
Iron Saturation: 31 % (ref 15–55)
Iron: 79 ug/dL (ref 27–139)
Total Iron Binding Capacity: 256 ug/dL (ref 250–450)
UIBC: 177 ug/dL (ref 118–369)

## 2024-09-21 LAB — CBC
Hematocrit: 39.4 % (ref 34.0–46.6)
Hemoglobin: 12.6 g/dL (ref 11.1–15.9)
MCH: 29.2 pg (ref 26.6–33.0)
MCHC: 32 g/dL (ref 31.5–35.7)
MCV: 91 fL (ref 79–97)
Platelets: 401 x10E3/uL (ref 150–450)
RBC: 4.32 x10E6/uL (ref 3.77–5.28)
RDW: 12.7 % (ref 11.7–15.4)
WBC: 7.9 x10E3/uL (ref 3.4–10.8)

## 2024-09-21 LAB — B12 AND FOLATE PANEL
Folate: >20 ng/mL (ref >3.0)
Vitamin B-12: 477 pg/mL (ref 232–1245)

## 2024-09-21 LAB — TSH: TSH: 2.89 u[IU]/mL (ref 0.450–4.500)

## 2024-09-22 ENCOUNTER — Telehealth: Payer: Self-pay

## 2024-09-22 ENCOUNTER — Other Ambulatory Visit: Payer: Self-pay | Admitting: Family Medicine

## 2024-09-22 ENCOUNTER — Ambulatory Visit: Payer: Self-pay | Admitting: Family Medicine

## 2024-09-22 DIAGNOSIS — E039 Hypothyroidism, unspecified: Secondary | ICD-10-CM

## 2024-09-22 NOTE — Telephone Encounter (Signed)
 Copied from CRM [id number]. Topic: Clinical - Lab/Test Results >> Sep 22, 2024 10:24 AM DeAngela L wrote: Reason for CRM: read labs from the provider to the patient and she has additional questions   Pt num [id number] (M)

## 2024-09-22 NOTE — Progress Notes (Signed)
 Called patient LVM to contact the office if pt calls please advised

## 2024-09-25 MED ORDER — TRAZODONE HCL 50 MG PO TABS: 25.0000 mg | ORAL_TABLET | Freq: Every evening | ORAL | 3 refills | Status: AC | PRN

## 2024-09-25 NOTE — Progress Notes (Signed)
 I will send in Trazodone  25-50 mg nightly for sleep. She has tolerated this medication well in the past. It is safer than Zolpidem in somebody of her age.

## 2024-09-25 NOTE — Progress Notes (Signed)
 She will reach out to the insurance

## 2024-09-25 NOTE — Progress Notes (Signed)
 Called patient spoke to daughter she is advised of her mother lab results. She wants something to help aid her sleeping something like zolpidem just a half dose of that. Her daughter stated that it helps a little with her coping throughout the night and day

## 2024-10-13 DIAGNOSIS — H52221 Regular astigmatism, right eye: Secondary | ICD-10-CM | POA: Diagnosis not present

## 2024-10-13 DIAGNOSIS — A5201 Syphilitic aneurysm of aorta: Secondary | ICD-10-CM | POA: Diagnosis not present

## 2024-10-13 DIAGNOSIS — H04123 Dry eye syndrome of bilateral lacrimal glands: Secondary | ICD-10-CM | POA: Diagnosis not present

## 2024-10-14 DIAGNOSIS — I2510 Atherosclerotic heart disease of native coronary artery without angina pectoris: Secondary | ICD-10-CM | POA: Diagnosis not present

## 2024-10-14 DIAGNOSIS — G629 Polyneuropathy, unspecified: Secondary | ICD-10-CM | POA: Diagnosis not present

## 2024-10-14 DIAGNOSIS — K5900 Constipation, unspecified: Secondary | ICD-10-CM | POA: Diagnosis not present

## 2024-10-14 DIAGNOSIS — I129 Hypertensive chronic kidney disease with stage 1 through stage 4 chronic kidney disease, or unspecified chronic kidney disease: Secondary | ICD-10-CM | POA: Diagnosis not present

## 2024-10-14 DIAGNOSIS — N1831 Chronic kidney disease, stage 3a: Secondary | ICD-10-CM | POA: Diagnosis not present

## 2024-10-14 DIAGNOSIS — M810 Age-related osteoporosis without current pathological fracture: Secondary | ICD-10-CM | POA: Diagnosis not present

## 2024-10-14 DIAGNOSIS — H269 Unspecified cataract: Secondary | ICD-10-CM | POA: Diagnosis not present

## 2024-10-14 DIAGNOSIS — E785 Hyperlipidemia, unspecified: Secondary | ICD-10-CM | POA: Diagnosis not present

## 2024-10-14 DIAGNOSIS — E039 Hypothyroidism, unspecified: Secondary | ICD-10-CM | POA: Diagnosis not present

## 2024-10-30 DIAGNOSIS — H16223 Keratoconjunctivitis sicca, not specified as Sjogren's, bilateral: Secondary | ICD-10-CM | POA: Diagnosis not present

## 2024-10-30 DIAGNOSIS — H26493 Other secondary cataract, bilateral: Secondary | ICD-10-CM | POA: Diagnosis not present

## 2025-01-03 ENCOUNTER — Other Ambulatory Visit: Payer: Self-pay

## 2025-01-03 ENCOUNTER — Ambulatory Visit

## 2025-01-03 DIAGNOSIS — Z1231 Encounter for screening mammogram for malignant neoplasm of breast: Secondary | ICD-10-CM

## 2025-01-10 ENCOUNTER — Other Ambulatory Visit: Payer: Self-pay

## 2025-01-10 ENCOUNTER — Telehealth: Payer: Self-pay | Admitting: *Deleted

## 2025-01-10 DIAGNOSIS — I10 Essential (primary) hypertension: Secondary | ICD-10-CM

## 2025-01-10 DIAGNOSIS — R7303 Prediabetes: Secondary | ICD-10-CM

## 2025-01-10 DIAGNOSIS — E785 Hyperlipidemia, unspecified: Secondary | ICD-10-CM

## 2025-01-10 DIAGNOSIS — N1831 Chronic kidney disease, stage 3a: Secondary | ICD-10-CM

## 2025-01-10 DIAGNOSIS — E039 Hypothyroidism, unspecified: Secondary | ICD-10-CM

## 2025-01-10 NOTE — Telephone Encounter (Signed)
 Copied from CRM [id number]. Topic: Clinical - Request for Lab/Test Order >> Jan 09, 2025  2:06 PM Montie POUR wrote: Reason for CRM:  On her 02/13/25 office note, it states she needs to come in for labs. There are no lab orders in chart. Please call Ms. Chamber at [phone] to schedule labs after orders are in her chart.

## 2025-01-10 NOTE — Telephone Encounter (Signed)
 Orders in

## 2025-01-17 ENCOUNTER — Ambulatory Visit

## 2025-01-25 ENCOUNTER — Ambulatory Visit

## 2025-01-26 ENCOUNTER — Other Ambulatory Visit: Payer: Self-pay | Admitting: Family Medicine

## 2025-01-26 DIAGNOSIS — I10 Essential (primary) hypertension: Secondary | ICD-10-CM

## 2025-01-29 ENCOUNTER — Inpatient Hospital Stay: Payer: Medicare PPO | Admitting: Hematology

## 2025-01-29 ENCOUNTER — Inpatient Hospital Stay: Payer: Medicare PPO

## 2025-02-08 ENCOUNTER — Ambulatory Visit

## 2025-02-13 ENCOUNTER — Ambulatory Visit

## 2025-02-28 ENCOUNTER — Inpatient Hospital Stay: Admitting: Hematology

## 2025-02-28 ENCOUNTER — Inpatient Hospital Stay

## 2025-05-03 ENCOUNTER — Ambulatory Visit
